# Patient Record
Sex: Female | Born: 1950 | Race: White | Hispanic: No | Smoking: Never smoker
Health system: Southern US, Community
[De-identification: ages and names within clinical notes are randomized; demographics above are authoritative.]

## PROBLEM LIST (undated history)

## (undated) DIAGNOSIS — E669 Obesity, unspecified: Secondary | ICD-10-CM

## (undated) DIAGNOSIS — N183 Chronic kidney disease, stage 3 unspecified: Secondary | ICD-10-CM

## (undated) DIAGNOSIS — E785 Hyperlipidemia, unspecified: Secondary | ICD-10-CM

## (undated) DIAGNOSIS — E039 Hypothyroidism, unspecified: Secondary | ICD-10-CM

## (undated) DIAGNOSIS — F329 Major depressive disorder, single episode, unspecified: Secondary | ICD-10-CM

## (undated) DIAGNOSIS — I10 Essential (primary) hypertension: Secondary | ICD-10-CM

## (undated) DIAGNOSIS — F32A Depression, unspecified: Secondary | ICD-10-CM

## (undated) DIAGNOSIS — E119 Type 2 diabetes mellitus without complications: Secondary | ICD-10-CM

## (undated) DIAGNOSIS — K219 Gastro-esophageal reflux disease without esophagitis: Secondary | ICD-10-CM

## (undated) DIAGNOSIS — Z8719 Personal history of other diseases of the digestive system: Secondary | ICD-10-CM

## (undated) DIAGNOSIS — G2581 Restless legs syndrome: Secondary | ICD-10-CM

## (undated) DIAGNOSIS — Z872 Personal history of diseases of the skin and subcutaneous tissue: Secondary | ICD-10-CM

## (undated) DIAGNOSIS — G709 Myoneural disorder, unspecified: Secondary | ICD-10-CM

## (undated) DIAGNOSIS — M545 Low back pain, unspecified: Secondary | ICD-10-CM

## (undated) DIAGNOSIS — J449 Chronic obstructive pulmonary disease, unspecified: Secondary | ICD-10-CM

## (undated) DIAGNOSIS — I499 Cardiac arrhythmia, unspecified: Secondary | ICD-10-CM

## (undated) DIAGNOSIS — Z85828 Personal history of other malignant neoplasm of skin: Secondary | ICD-10-CM

## (undated) DIAGNOSIS — K922 Gastrointestinal hemorrhage, unspecified: Secondary | ICD-10-CM

## (undated) DIAGNOSIS — L80 Vitiligo: Secondary | ICD-10-CM

## (undated) DIAGNOSIS — J189 Pneumonia, unspecified organism: Secondary | ICD-10-CM

## (undated) DIAGNOSIS — M1990 Unspecified osteoarthritis, unspecified site: Secondary | ICD-10-CM

## (undated) DIAGNOSIS — C801 Malignant (primary) neoplasm, unspecified: Secondary | ICD-10-CM

## (undated) HISTORY — DX: Major depressive disorder, single episode, unspecified: F32.9

## (undated) HISTORY — PX: APPENDECTOMY: SHX54

## (undated) HISTORY — DX: Gastro-esophageal reflux disease without esophagitis: K21.9

## (undated) HISTORY — DX: Low back pain, unspecified: M54.50

## (undated) HISTORY — PX: SHOULDER SURGERY: SHX246

## (undated) HISTORY — DX: Low back pain: M54.5

## (undated) HISTORY — PX: COLONOSCOPY: SHX174

## (undated) HISTORY — DX: Depression, unspecified: F32.A

## (undated) HISTORY — DX: Chronic kidney disease, stage 3 unspecified: N18.30

## (undated) HISTORY — DX: Restless legs syndrome: G25.81

## (undated) HISTORY — DX: Hypothyroidism, unspecified: E03.9

## (undated) HISTORY — DX: Obesity, unspecified: E66.9

## (undated) HISTORY — DX: Chronic kidney disease, stage 3 (moderate): N18.3

## (undated) HISTORY — PX: ABDOMINAL HYSTERECTOMY: SHX81

## (undated) HISTORY — PX: BACK SURGERY: SHX140

## (undated) HISTORY — DX: Hyperlipidemia, unspecified: E78.5

## (undated) HISTORY — PX: CARPAL TUNNEL RELEASE: SHX101

## (undated) HISTORY — PX: TOTAL HIP ARTHROPLASTY: SHX124

---

## 1974-03-24 HISTORY — PX: TUBAL LIGATION: SHX77

## 1982-03-24 HISTORY — PX: FOOT SURGERY: SHX648

## 1983-03-25 HISTORY — PX: PARTIAL HYSTERECTOMY: SHX80

## 1995-03-25 HISTORY — PX: SHOULDER SURGERY: SHX246

## 2000-08-08 ENCOUNTER — Emergency Department (HOSPITAL_COMMUNITY): Admission: EM | Admit: 2000-08-08 | Discharge: 2000-08-09 | Payer: Self-pay | Admitting: *Deleted

## 2000-09-30 ENCOUNTER — Emergency Department (HOSPITAL_COMMUNITY): Admission: EM | Admit: 2000-09-30 | Discharge: 2000-09-30 | Payer: Self-pay | Admitting: *Deleted

## 2000-12-31 ENCOUNTER — Ambulatory Visit (HOSPITAL_COMMUNITY): Admission: RE | Admit: 2000-12-31 | Discharge: 2000-12-31 | Payer: Self-pay | Admitting: Family Medicine

## 2000-12-31 ENCOUNTER — Encounter: Payer: Self-pay | Admitting: Family Medicine

## 2001-04-05 ENCOUNTER — Encounter: Payer: Self-pay | Admitting: Emergency Medicine

## 2001-04-05 ENCOUNTER — Inpatient Hospital Stay (HOSPITAL_COMMUNITY): Admission: EM | Admit: 2001-04-05 | Discharge: 2001-04-08 | Payer: Self-pay | Admitting: Emergency Medicine

## 2001-04-07 ENCOUNTER — Encounter: Payer: Self-pay | Admitting: Cardiovascular Disease

## 2001-12-31 ENCOUNTER — Encounter: Payer: Self-pay | Admitting: Internal Medicine

## 2001-12-31 ENCOUNTER — Emergency Department (HOSPITAL_COMMUNITY): Admission: EM | Admit: 2001-12-31 | Discharge: 2001-12-31 | Payer: Self-pay | Admitting: Emergency Medicine

## 2002-01-10 ENCOUNTER — Encounter: Payer: Self-pay | Admitting: Family Medicine

## 2002-01-10 ENCOUNTER — Ambulatory Visit (HOSPITAL_COMMUNITY): Admission: RE | Admit: 2002-01-10 | Discharge: 2002-01-10 | Payer: Self-pay | Admitting: Family Medicine

## 2002-01-26 ENCOUNTER — Encounter: Payer: Self-pay | Admitting: Family Medicine

## 2002-01-26 ENCOUNTER — Ambulatory Visit (HOSPITAL_COMMUNITY): Admission: RE | Admit: 2002-01-26 | Discharge: 2002-01-26 | Payer: Self-pay | Admitting: Family Medicine

## 2002-03-03 ENCOUNTER — Ambulatory Visit (HOSPITAL_COMMUNITY): Admission: RE | Admit: 2002-03-03 | Discharge: 2002-03-03 | Payer: Self-pay | Admitting: Internal Medicine

## 2002-08-24 ENCOUNTER — Ambulatory Visit (HOSPITAL_COMMUNITY): Admission: RE | Admit: 2002-08-24 | Discharge: 2002-08-24 | Payer: Self-pay | Admitting: Family Medicine

## 2002-10-29 ENCOUNTER — Emergency Department (HOSPITAL_COMMUNITY): Admission: EM | Admit: 2002-10-29 | Discharge: 2002-10-30 | Payer: Self-pay | Admitting: Emergency Medicine

## 2002-10-30 ENCOUNTER — Encounter: Payer: Self-pay | Admitting: Emergency Medicine

## 2003-01-23 ENCOUNTER — Ambulatory Visit (HOSPITAL_COMMUNITY): Admission: RE | Admit: 2003-01-23 | Discharge: 2003-01-23 | Payer: Self-pay | Admitting: Nurse Practitioner

## 2004-02-20 ENCOUNTER — Encounter (HOSPITAL_COMMUNITY): Admission: RE | Admit: 2004-02-20 | Discharge: 2004-03-21 | Payer: Self-pay | Admitting: Preventative Medicine

## 2005-06-29 ENCOUNTER — Observation Stay (HOSPITAL_COMMUNITY): Admission: EM | Admit: 2005-06-29 | Discharge: 2005-06-30 | Payer: Self-pay | Admitting: Emergency Medicine

## 2005-06-30 ENCOUNTER — Ambulatory Visit: Payer: Self-pay | Admitting: Cardiology

## 2008-03-24 HISTORY — PX: INCISE AND DRAIN ABCESS: PRO64

## 2008-05-14 ENCOUNTER — Inpatient Hospital Stay (HOSPITAL_COMMUNITY): Admission: EM | Admit: 2008-05-14 | Discharge: 2008-05-18 | Payer: Self-pay | Admitting: Emergency Medicine

## 2008-05-15 ENCOUNTER — Encounter (INDEPENDENT_AMBULATORY_CARE_PROVIDER_SITE_OTHER): Payer: Self-pay | Admitting: *Deleted

## 2008-05-15 LAB — CONVERTED CEMR LAB
Hgb A1c MFr Bld: 7.1 %
TSH: 2.818 microintl units/mL
TSH: 2.818 u[IU]/mL

## 2008-05-16 ENCOUNTER — Encounter (INDEPENDENT_AMBULATORY_CARE_PROVIDER_SITE_OTHER): Payer: Self-pay | Admitting: *Deleted

## 2008-05-16 LAB — CONVERTED CEMR LAB
Ferritin: 176 ng/mL
TIBC: 238 ug/dL
UIBC: 189 ug/dL

## 2008-06-09 ENCOUNTER — Emergency Department (HOSPITAL_COMMUNITY): Admission: EM | Admit: 2008-06-09 | Discharge: 2008-06-09 | Payer: Self-pay | Admitting: Emergency Medicine

## 2008-06-14 ENCOUNTER — Ambulatory Visit (HOSPITAL_COMMUNITY): Admission: RE | Admit: 2008-06-14 | Discharge: 2008-06-14 | Payer: Self-pay | Admitting: Internal Medicine

## 2008-10-13 ENCOUNTER — Emergency Department (HOSPITAL_COMMUNITY): Admission: EM | Admit: 2008-10-13 | Discharge: 2008-10-13 | Payer: Self-pay | Admitting: Emergency Medicine

## 2008-11-06 ENCOUNTER — Emergency Department (HOSPITAL_COMMUNITY): Admission: EM | Admit: 2008-11-06 | Discharge: 2008-11-06 | Payer: Self-pay | Admitting: Emergency Medicine

## 2008-11-08 ENCOUNTER — Emergency Department (HOSPITAL_COMMUNITY): Admission: EM | Admit: 2008-11-08 | Discharge: 2008-11-08 | Payer: Self-pay | Admitting: Emergency Medicine

## 2008-12-27 ENCOUNTER — Ambulatory Visit (HOSPITAL_COMMUNITY): Admission: RE | Admit: 2008-12-27 | Discharge: 2008-12-27 | Payer: Self-pay | Admitting: Family Medicine

## 2009-01-22 ENCOUNTER — Encounter (INDEPENDENT_AMBULATORY_CARE_PROVIDER_SITE_OTHER): Payer: Self-pay | Admitting: *Deleted

## 2009-01-22 ENCOUNTER — Ambulatory Visit: Payer: Self-pay | Admitting: Cardiology

## 2009-01-22 ENCOUNTER — Ambulatory Visit: Admission: RE | Admit: 2009-01-22 | Discharge: 2009-01-22 | Payer: Self-pay | Admitting: Neurology

## 2009-01-22 DIAGNOSIS — I10 Essential (primary) hypertension: Secondary | ICD-10-CM | POA: Insufficient documentation

## 2009-01-22 DIAGNOSIS — R55 Syncope and collapse: Secondary | ICD-10-CM | POA: Insufficient documentation

## 2009-01-22 DIAGNOSIS — K219 Gastro-esophageal reflux disease without esophagitis: Secondary | ICD-10-CM

## 2009-01-22 DIAGNOSIS — E039 Hypothyroidism, unspecified: Secondary | ICD-10-CM

## 2009-01-22 DIAGNOSIS — E669 Obesity, unspecified: Secondary | ICD-10-CM | POA: Insufficient documentation

## 2009-01-22 DIAGNOSIS — E785 Hyperlipidemia, unspecified: Secondary | ICD-10-CM

## 2009-01-26 ENCOUNTER — Encounter: Payer: Self-pay | Admitting: Cardiology

## 2009-02-05 ENCOUNTER — Ambulatory Visit (HOSPITAL_COMMUNITY): Admission: RE | Admit: 2009-02-05 | Discharge: 2009-02-05 | Payer: Self-pay | Admitting: Cardiology

## 2009-02-05 ENCOUNTER — Encounter: Payer: Self-pay | Admitting: Cardiology

## 2009-02-05 ENCOUNTER — Ambulatory Visit: Payer: Self-pay | Admitting: Cardiology

## 2009-02-08 ENCOUNTER — Encounter (INDEPENDENT_AMBULATORY_CARE_PROVIDER_SITE_OTHER): Payer: Self-pay | Admitting: *Deleted

## 2009-02-21 ENCOUNTER — Telehealth (INDEPENDENT_AMBULATORY_CARE_PROVIDER_SITE_OTHER): Payer: Self-pay | Admitting: *Deleted

## 2009-03-17 ENCOUNTER — Ambulatory Visit: Payer: Self-pay | Admitting: Cardiology

## 2009-03-18 ENCOUNTER — Ambulatory Visit: Payer: Self-pay | Admitting: Cardiology

## 2009-03-20 ENCOUNTER — Encounter (INDEPENDENT_AMBULATORY_CARE_PROVIDER_SITE_OTHER): Payer: Self-pay | Admitting: *Deleted

## 2009-03-21 ENCOUNTER — Ambulatory Visit: Payer: Self-pay | Admitting: Cardiology

## 2009-03-21 ENCOUNTER — Encounter (INDEPENDENT_AMBULATORY_CARE_PROVIDER_SITE_OTHER): Payer: Self-pay | Admitting: *Deleted

## 2009-04-25 ENCOUNTER — Encounter: Payer: Self-pay | Admitting: Cardiology

## 2009-04-27 ENCOUNTER — Encounter (INDEPENDENT_AMBULATORY_CARE_PROVIDER_SITE_OTHER): Payer: Self-pay | Admitting: *Deleted

## 2009-04-27 ENCOUNTER — Encounter: Payer: Self-pay | Admitting: Cardiology

## 2009-04-27 LAB — CONVERTED CEMR LAB
BUN: 27 mg/dL
BUN: 27 mg/dL — ABNORMAL HIGH (ref 6–23)
CO2: 27 meq/L
CO2: 27 meq/L
Calcium: 9.2 mg/dL
Calcium: 9.2 mg/dL (ref 8.4–10.5)
Chloride: 96 meq/L
Creatinine, Ser: 1.59 mg/dL
Creatinine, Ser: 1.59 mg/dL — ABNORMAL HIGH (ref 0.40–1.20)
Glucose, Bld: 117 mg/dL
Glucose, Bld: 117 mg/dL — ABNORMAL HIGH (ref 70–99)
Sodium: 134 meq/L

## 2009-04-30 ENCOUNTER — Encounter (INDEPENDENT_AMBULATORY_CARE_PROVIDER_SITE_OTHER): Payer: Self-pay | Admitting: *Deleted

## 2009-05-10 ENCOUNTER — Ambulatory Visit (HOSPITAL_COMMUNITY): Admission: RE | Admit: 2009-05-10 | Discharge: 2009-05-10 | Payer: Self-pay | Admitting: Orthopaedic Surgery

## 2009-06-26 ENCOUNTER — Encounter (INDEPENDENT_AMBULATORY_CARE_PROVIDER_SITE_OTHER): Payer: Self-pay | Admitting: *Deleted

## 2009-07-24 ENCOUNTER — Telehealth (INDEPENDENT_AMBULATORY_CARE_PROVIDER_SITE_OTHER): Payer: Self-pay

## 2009-07-24 ENCOUNTER — Ambulatory Visit: Payer: Self-pay | Admitting: Internal Medicine

## 2009-07-24 DIAGNOSIS — D649 Anemia, unspecified: Secondary | ICD-10-CM | POA: Insufficient documentation

## 2009-07-25 ENCOUNTER — Encounter: Payer: Self-pay | Admitting: Internal Medicine

## 2009-07-26 ENCOUNTER — Ambulatory Visit: Payer: Self-pay | Admitting: Internal Medicine

## 2009-08-03 ENCOUNTER — Encounter (INDEPENDENT_AMBULATORY_CARE_PROVIDER_SITE_OTHER): Payer: Self-pay | Admitting: *Deleted

## 2009-08-03 LAB — CONVERTED CEMR LAB
Eosinophils Absolute: 0.3 10*3/uL
Eosinophils Relative: 4 %
Ferritin: 169 ng/mL
HCT: 31.2 %
Hemoglobin: 10.3 g/dL
Iron: 75 ug/dL
Lymphs Abs: 2.3 10*3/uL
MCHC: 29 g/dL
Monocytes Relative: 8 %
RBC: 3.55 M/uL

## 2009-08-06 ENCOUNTER — Encounter: Payer: Self-pay | Admitting: Gastroenterology

## 2009-08-06 LAB — CONVERTED CEMR LAB
Eosinophils Absolute: 0.3 10*3/uL (ref 0.0–0.7)
Eosinophils Relative: 4 % (ref 0–5)
HCT: 31.2 % — ABNORMAL LOW (ref 36.0–46.0)
Iron: 75 ug/dL (ref 42–145)
Lymphs Abs: 2.3 10*3/uL (ref 0.7–4.0)
MCV: 87.9 fL (ref 78.0–100.0)
Platelets: 354 10*3/uL (ref 150–400)
RDW: 14.3 % (ref 11.5–15.5)
WBC: 8.1 10*3/uL (ref 4.0–10.5)

## 2009-08-07 ENCOUNTER — Ambulatory Visit: Payer: Self-pay | Admitting: Internal Medicine

## 2009-08-07 ENCOUNTER — Ambulatory Visit (HOSPITAL_COMMUNITY): Admission: RE | Admit: 2009-08-07 | Discharge: 2009-08-07 | Payer: Self-pay | Admitting: Internal Medicine

## 2009-08-10 ENCOUNTER — Encounter: Payer: Self-pay | Admitting: Gastroenterology

## 2009-09-07 ENCOUNTER — Encounter (INDEPENDENT_AMBULATORY_CARE_PROVIDER_SITE_OTHER): Payer: Self-pay

## 2009-09-17 ENCOUNTER — Encounter (INDEPENDENT_AMBULATORY_CARE_PROVIDER_SITE_OTHER): Payer: Self-pay | Admitting: *Deleted

## 2009-09-17 LAB — CONVERTED CEMR LAB
Basophils Relative: 0 %
HCT: 31.9 %
Hemoglobin: 10.1 g/dL
Lymphocytes Relative: 21 %
Lymphocytes Relative: 21 %
Lymphs Abs: 2.4 10*3/uL
Lymphs Abs: 2.4 10*3/uL
MCHC: 28.9 g/dL
Monocytes Absolute: 1 10*3/uL
Monocytes Relative: 9 %
Monocytes Relative: 9 %
Neutrophils Relative %: 397 %
RBC: 3.49 M/uL
RDW: 14.8 %
RDW: 31.7 %

## 2009-09-19 LAB — CONVERTED CEMR LAB
Basophils Relative: 0 % (ref 0–1)
Eosinophils Absolute: 0.2 10*3/uL (ref 0.0–0.7)
Eosinophils Relative: 2 % (ref 0–5)
MCHC: 31.7 g/dL (ref 30.0–36.0)
MCV: 91.4 fL (ref 78.0–100.0)
Monocytes Absolute: 1 10*3/uL (ref 0.1–1.0)
Monocytes Relative: 9 % (ref 3–12)
Neutrophils Relative %: 68 % (ref 43–77)
RBC: 3.49 M/uL — ABNORMAL LOW (ref 3.87–5.11)

## 2009-09-23 ENCOUNTER — Emergency Department (HOSPITAL_COMMUNITY): Admission: EM | Admit: 2009-09-23 | Discharge: 2009-09-23 | Payer: Self-pay | Admitting: Emergency Medicine

## 2009-10-16 ENCOUNTER — Encounter (INDEPENDENT_AMBULATORY_CARE_PROVIDER_SITE_OTHER): Payer: Self-pay | Admitting: *Deleted

## 2009-11-20 ENCOUNTER — Encounter (INDEPENDENT_AMBULATORY_CARE_PROVIDER_SITE_OTHER): Payer: Self-pay | Admitting: *Deleted

## 2009-11-22 ENCOUNTER — Ambulatory Visit: Payer: Self-pay | Admitting: Cardiology

## 2009-11-22 DIAGNOSIS — M109 Gout, unspecified: Secondary | ICD-10-CM | POA: Insufficient documentation

## 2009-12-24 ENCOUNTER — Ambulatory Visit: Payer: Self-pay | Admitting: Cardiology

## 2009-12-27 ENCOUNTER — Emergency Department (HOSPITAL_COMMUNITY)
Admission: EM | Admit: 2009-12-27 | Discharge: 2009-12-27 | Payer: Self-pay | Source: Home / Self Care | Admitting: Emergency Medicine

## 2009-12-27 LAB — CONVERTED CEMR LAB
Calcium: 8.7 mg/dL
Glomerular Filtration Rate, Af Am: 29 mL/min/{1.73_m2}
HCT: 28.4 %
MCV: 31.5 fL
Potassium: 4.2 meq/L
Sodium: 133 meq/L
Specific Gravity, Urine: 1.015
Urine Glucose: >1000 mg/dL
pH: 5.5

## 2009-12-31 ENCOUNTER — Ambulatory Visit (HOSPITAL_COMMUNITY)
Admission: RE | Admit: 2009-12-31 | Discharge: 2009-12-31 | Payer: Self-pay | Source: Home / Self Care | Admitting: Family Medicine

## 2010-01-31 ENCOUNTER — Encounter: Payer: Self-pay | Admitting: Cardiology

## 2010-02-20 ENCOUNTER — Ambulatory Visit: Payer: Self-pay | Admitting: Cardiology

## 2010-02-20 LAB — CONVERTED CEMR LAB
CO2: 25 meq/L (ref 19–32)
Calcium: 9.7 mg/dL (ref 8.4–10.5)
Creatinine, Ser: 2.47 mg/dL — ABNORMAL HIGH (ref 0.40–1.20)
Glucose, Bld: 396 mg/dL — ABNORMAL HIGH (ref 70–99)

## 2010-03-03 ENCOUNTER — Emergency Department (HOSPITAL_COMMUNITY)
Admission: EM | Admit: 2010-03-03 | Discharge: 2010-03-03 | Payer: Self-pay | Source: Home / Self Care | Admitting: Emergency Medicine

## 2010-04-21 LAB — CONVERTED CEMR LAB
BUN: 14 mg/dL
Calcium: 8.5 mg/dL
Creatinine, Ser: 1.6 mg/dL
HDL: 21 mg/dL
MCV: 85 fL
Platelets: 369 10*3/uL
TSH: 2.8 microintl units/mL
Total CK: 53 units/L
Triglycerides: 287 mg/dL

## 2010-04-23 NOTE — Progress Notes (Signed)
Summary: pt to do ifobt stool test  ---- Converted from flag ---- ---- 07/24/2009 12:20 PM, Laureen Ochs. Lewis PA-C wrote: she needs to do ifobt asap. if positive, then will need to add on egd at time of tcs. dx, chronic anemia ------------------------------  Appended Document: pt to do ifobt stool test Pt informed. She will come by tomorrow and ask to speak to nurse for directions on doing the test.

## 2010-04-23 NOTE — Letter (Signed)
Summary: [facility], general practice at [facility]. [address] [place] [address]   Phone: [phone]  Fax: [phone]      April 30, 2009 MRN: [id number]   [facility] [address] [place], [place]  [address]   Dear Ms. Gilford Rile,  Your test ordered by Rande Lawman has been reviewed by your physician (or physician assistant) and was found to be normal or stable. Your physician (or physician assistant) felt no changes were needed at this time.  ____ Echocardiogram  ____ Cardiac Stress Test  __x__ Lab Work  ____ Peripheral vascular study of arms, legs or neck  ____ CT scan or X-ray  ____ Lung or Breathing test  ____ Other:  No change in medical treatment at this time, your labwork is stable, per Dr. Lattie Haw.  Thank you, Perrin Eddleman Baird Cancer RN    Jacqulyn Ducking, MD, Leana Gamer.C.Renella Cunas, MD, F.A.C.C Cristopher Peru, MD, F.A.C.C Rozann Lesches, MD, F.A.C.C Jenkins Rouge, MD, Leana Gamer.C.C

## 2010-04-23 NOTE — Procedures (Signed)
Summary: LIFE WATCH  LIFE WATCH   Imported By: Nevada Crane 04/25/2009 11:42:27  _____________________________________________________________________  External Attachment:    Type:   Image     Comment:   External Document

## 2010-04-23 NOTE — Assessment & Plan Note (Signed)
Summary: 1 MTH NURSE VISIT/BP CHECK PER CHECKOUT ON 11/22/09/TG  Nurse Visit   Vital Signs:  Patient profile:   60 year old female O2 Sat:      95 % on Room air Pulse rate:   75 / minute BP sitting:   159 / 82  (left arm)  Vitals Entered By: Jeani Hawking Via LPN (October  3, 624THL 9:08 AM)  O2 Flow:  Room air  Visit Type:  BP check/Nurse visit Referring Provider:  . Primary Provider:  Karie Kirks   History of Present Illness: S: Pt. returns to office for 1 month BP check/nurse visit. B: On last OV with Dr. Lattie Haw on 11-22-09 BP was suboptimal at 138/69. A: Pt. has no complaints at this time. BP this morning= 159/82, pt's home BP's varies. Pt. brought in med list and BP diary. Pt. is taking meds as directed and BP diary scanned into chart. R: Pt. advised to continue on current meds and we will call her with Dr. Izell Viola recommendations.  12/29/09  amlodipine 5 mg by mouth once daily Bmet BP check and review of home BPs in 1 month.  Jacqulyn Ducking, M.D.  [facility].   Jeani Hawking Via LPN  October 10, 624THL 8:38 AM   Pt. advised, she states she understands instructions given. RX faxed to [facility]. BMET was drawn on 12-27-09 while pt. was in ER at [facility]. (entered in chart). Pt. given BP diary and asked to bring back with her to nurse visit in 1 month.    Jeani Hawking Via LPN  October 10, 624THL 1:14 PM    Current Medications (verified): 1)  Celexa 20 Mg Tabs (Citalopram Hydrobromide) .... Take 1 Tab Daily 2)  Gabapentin 800 Mg Tabs (Gabapentin) .... Take 1 Tab Three Times A Day 3)  Hydrochlorothiazide 25 Mg Tabs (Hydrochlorothiazide) .... Take 1 Tab Daily 4)  Levothyroxine Sodium 50 Mcg Tabs (Levothyroxine Sodium) .... Take 1 Tab Daily 5)  Omeprazole 20 Mg Cpdr (Omeprazole) .... Take 1 Tab Two Times A Day 6)  Pravastatin Sodium 40 Mg Tabs (Pravastatin Sodium) .Marland Kitchen.. 1 Tab By Mouth Once Daily 7)  Glipizide 5 Mg Tabs (Glipizide) .... Take 1 Tablet By Mouth Two Times A Day 8)  Lisinopril 20 Mg Tabs  (Lisinopril) .... Take One Tablet By Mouth Daily 9)  Allopurinol 300 Mg Tabs (Allopurinol) .... Take 1 Tablet By Mouth Once A Day 10)  Ropinirole Hcl 0.5 Mg Tabs (Ropinirole Hcl) .... Take 3 Tablets By Mouth At Bedtime 11)  Potassium Chloride Crys Cr 20 Meq Cr-Tabs (Potassium Chloride Crys Cr) .... Take As Needed 12)  Furosemide 40 Mg Tabs (Furosemide) .... Take As Needed 13)  Ativan 1 Mg Tabs (Lorazepam) .... Take 1/2 Tab Three Times A Day 14)  Lantus 100 Unit/ml Soln (Insulin Glargine) .... 50 Units At Night 15)  Diclofenac Sodium 75 Mg Tbec (Diclofenac Sodium) .... Take 1 Tab Two Times A Day 16)  Zolpidem Tartrate 10 Mg Tabs (Zolpidem Tartrate) .... Take 1 Tablet Every Other Day 17)  Hydrocodone-Acetaminophen 7.5-500 Mg Tabs (Hydrocodone-Acetaminophen) .... Take 1 Tablet As Needed 18)  Amlodipine Besylate 5 Mg Tabs (Amlodipine Besylate) .... Take 1 Tablet By Mouth Once Daily  Allergies (verified): 1)  ! * Latex 2)  ! Vancomycin Hcl in Dextrose (Vancomycin Hcl in Dextrose) 3)  ! Accupril  Orders Added: 1)  T-Basic Metabolic Panel 0000000 Prescriptions: AMLODIPINE BESYLATE 5 MG TABS (AMLODIPINE BESYLATE) take 1 tablet by mouth once daily  #30 x 3   Entered  by:   Jeani Hawking Via LPN   Authorized by:   Yehuda Savannah, MD, [facility]   Signed by:   Jeani Hawking Via LPN on [id number]   Method used:   Electronically to        [facility] (retail)       [place] [address]       [place], [place]  [address]       Ph: [phone]       Fax: [phone]   RxID:   [id number]

## 2010-04-23 NOTE — Letter (Signed)
Summary: BP LOG  BP LOG   Imported By: Nevada Crane 12/24/2009 11:10:18  _____________________________________________________________________  External Attachment:    Type:   Image     Comment:   External Document

## 2010-04-23 NOTE — Procedures (Signed)
Summary: Holter and Event  Holter and Event   Imported By: [name], CNA 03/27/2009 10:26:53  _____________________________________________________________________  External Attachment:    Type:   Image     Comment:   External Document

## 2010-04-23 NOTE — Letter (Signed)
Summary: Recall, Labs Needed  [facility]  [address]   [place], [place] [address]   Phone: [phone]  Fax: [phone]    September 07, 2009  TARRIN LECKNER [address] [place], [place]  [address] [birth year]   Dear Ms. Gilford Rile,   Our records indicate it is time to repeat your blood work.  You can take the enclosed form to the lab on or near the date indicated.  Please make note of the new location of the lab:   [facility], 2nd floor   [place] office will call you within a week to ten business days with the results.  If you do not hear from [place] in 10 business days, you should call the office.  If you have any questions regarding this, call the office at [phone], and ask for the nurse.  Labs are due on 10/01/2009.   Sincerely,    Burnadette Peter LPN  [facility] Ph: [phone]   Fax: [phone]

## 2010-04-23 NOTE — Miscellaneous (Signed)
Summary: LABS CBCD,09/17/2009  Clinical Lists Changes  Observations: Added new observation of ABSOLUTE BAS: 0.1 K/uL (09/17/2009 10:04) Added new observation of BASOPHIL %: 0 % (09/17/2009 10:04) Added new observation of EOS ABSLT: 0.2 K/uL (09/17/2009 10:04) Added new observation of % EOS AUTO: 2 % (09/17/2009 10:04) Added new observation of ABSOLUTE MON: 1.0 K/uL (09/17/2009 10:04) Added new observation of MONOCYTE %: 9 % (09/17/2009 10:04) Added new observation of ABS LYMPHOCY: 2.4 K/uL (09/17/2009 10:04) Added new observation of LYMPHS %: 21 % (09/17/2009 10:04) Added new observation of PMN %: 397 % (09/17/2009 10:04) Added new observation of PLATELETK/UL: 14.8 K/uL (09/17/2009 10:04) Added new observation of RDW: 31.7 % (09/17/2009 10:04) Added new observation of MCHC RBC: 28.9 g/dL (09/17/2009 10:04) Added new observation of MCV: 91.4 fL (09/17/2009 10:04) Added new observation of HCT: 31.9 % (09/17/2009 10:04) Added new observation of HGB: 10.1 g/dL (09/17/2009 10:04) Added new observation of RBC M/UL: 3.49 M/uL (09/17/2009 10:04) Added new observation of WBC COUNT: 11.6 10*3/microliter (09/17/2009 10:04) Added new observation of FERRITIN: 169 ng/mL (08/03/2009 10:04) Added new observation of IRON SATUR %: 25 % (08/03/2009 10:04) Added new observation of TIBC: 306 mcg/dL (08/03/2009 10:04) Added new observation of UIBC: 231 mcg/dL (08/03/2009 10:04) Added new observation of IRON: 75 mcg/dL (08/03/2009 10:04) Added new observation of ABSOLUTE BAS: 0.1 K/uL (08/03/2009 10:04) Added new observation of BASOPHIL %: 1 % (08/03/2009 10:04) Added new observation of EOS ABSLT: 0.3 K/uL (08/03/2009 10:04) Added new observation of % EOS AUTO: 4 % (08/03/2009 10:04) Added new observation of ABSOLUTE MON: 0.7 K/uL (08/03/2009 10:04) Added new observation of MONOCYTE %: 8 % (08/03/2009 10:04) Added new observation of ABS LYMPHOCY: 2.3 K/uL (08/03/2009 10:04) Added new observation of LYMPHS  %: 28 % (08/03/2009 10:04) Added new observation of PLATELETK/UL: 354 K/uL (08/03/2009 10:04) Added new observation of RDW: 14.3 % (08/03/2009 10:04) Added new observation of MCHC RBC: 29.0 g/dL (08/03/2009 10:04) Added new observation of MCV: 87.9 fL (08/03/2009 10:04) Added new observation of HCT: 31.2 % (08/03/2009 10:04) Added new observation of HGB: 10.3 g/dL (08/03/2009 10:04) Added new observation of RBC M/UL: 3.55 M/uL (08/03/2009 10:04) Added new observation of WBC COUNT: 8.1 10*3/microliter (08/03/2009 10:04) Added new observation of CALCIUM: 9.2 mg/dL (04/27/2009 10:04) Added new observation of CREATININE: 1.59 mg/dL (04/27/2009 10:04) Added new observation of BUN: 27 mg/dL (04/27/2009 10:04) Added new observation of BG RANDOM: 117 mg/dL (04/27/2009 10:04) Added new observation of CO2 PLSM/SER: 27 meq/L (04/27/2009 10:04) Added new observation of CL SERUM: 96 meq/L (04/27/2009 10:04) Added new observation of K SERUM: 4.6 meq/L (04/27/2009 10:04) Added new observation of NA: 134 meq/L (04/27/2009 10:04)

## 2010-04-23 NOTE — Letter (Signed)
Summary: TCS ORDER  TCS ORDER   Imported By: Sofie Rower 07/24/2009 11:24:44  _____________________________________________________________________  External Attachment:    Type:   Image     Comment:   External Document  Appended Document: TCS ORDER I called pt to give dm adjustment given to me per Magda Paganini take half her Glipizide the day before her procedure.

## 2010-04-23 NOTE — Assessment & Plan Note (Signed)
Summary: [id number]   Visit Type:  Follow-up Referring Fantasia Jinkins:  . Primary [name]:  Karie Kirks   History of Present Illness: [name] returns to the office for continued assessment and treatment of syncope and cardiovascular risk factors.  Since her last visit, she has done generally well.  She has had no episodes of loss of consciousness and no falls.  She occasionally experiences orthostatic dizziness that resolves quickly.  She was diagnosed with restless leg syndrome and is being treated with ropinirole.  Treatment for diabetes, hypertension and hyperlipidemia has been rationalized by Dr. Karie Kirks.  She developed lower extremity edema in the setting of bilateral knee arthritis and was given furosemide, which she takes once or twice a week.  Current Medications (verified): 1)  Celexa 20 Mg Tabs (Citalopram Hydrobromide) .... Take 1 Tab Daily 2)  Gabapentin 800 Mg Tabs (Gabapentin) .... Take 1 Tab Three Times A Day 3)  Hydrochlorothiazide 25 Mg Tabs (Hydrochlorothiazide) .... Take 1 Tab Daily 4)  Levothyroxine Sodium 50 Mcg Tabs (Levothyroxine Sodium) .... Take 1 Tab Daily 5)  Omeprazole 20 Mg Cpdr (Omeprazole) .... Take 1 Tab Two Times A Day 6)  Pravastatin Sodium 40 Mg Tabs (Pravastatin Sodium) .Marland Kitchen.. 1 Tab By Mouth Once Daily 7)  Glipizide 5 Mg Tabs (Glipizide) .... Take 1 Tablet By Mouth Two Times A Day 8)  Lisinopril 20 Mg Tabs (Lisinopril) .... Take One Tablet By Mouth Daily 9)  Allopurinol 300 Mg Tabs (Allopurinol) .... Take 1 Tablet By Mouth Once A Day 10)  Ropinirole Hcl 0.25 Mg Tabs (Ropinirole Hcl) .... Take 3 Tablets At Bed Time 11)  Potassium Chloride Crys Cr 20 Meq Cr-Tabs (Potassium Chloride Crys Cr) .... Take As Needed 12)  Furosemide 40 Mg Tabs (Furosemide) .... Take As Needed 13)  Ativan 1 Mg Tabs (Lorazepam) .... Take 1/2 Tab Three Times A Day 14)  Lantus 100 Unit/ml Soln (Insulin Glargine) .... 50 Units At Night 15)  Diclofenac Sodium 75 Mg Tbec (Diclofenac Sodium) ....  Take 1 Tab Two Times A Day  Allergies (verified): 1)  ! * Latex 2)  ! Vancomycin Hcl in Dextrose (Vancomycin Hcl in Dextrose) 3)  ! Accupril  Past History:  PMH, FH, and Social History reviewed and updated.  Past Medical History: Chest pain-hospitalized in 2007 Syncope: 2010  HYPERLIPIDEMIA (ICD-272.4) HYPERTENSION (ICD-401.9) DM (ICD-250.00) Restless leg syndrome Gastroesophageal reflux disease Gastrointestinal bleeding secondary to nonsteroidals treated in ED several years ago in [place] Obesity Gout HYPOTHYROIDISM (ICD-244.9) Insomnia Low back pain Cellulitis of the neck-2010 TCS 2003--> Normal colonoscopy except few small diverticula at sigmoid colon and external hemorrhoids. MI, 2001  Review of Systems  The patient denies weight loss, weight gain, chest pain, syncope, dyspnea on exertion, peripheral edema, headaches, and abdominal pain.    Vital Signs:  Patient profile:   60 year old female Weight:      217 pounds BMI:     34.11 Pulse rate:   85 / minute Pulse (ortho):   80 / minute BP sitting:   138 / 69  (right arm) BP standing:   128 / 74  Vitals Entered By: [name], CNA (November 22, 2009 11:55 AM)  Serial Vital Signs/Assessments:  Time      Position  BP       Pulse  Resp  Temp     By 12:48 PM  Lying RA  117/66   77  [name], CNA 12:48 PM  Sitting   128/73   [address] CNA 12:48 PM  Standing  128/74   90                    [address], CNA   Physical Exam  General:  Overweight; well developed; no acute distress: Vital signs-no difference between lying and standing blood pressures; weight is 217, 10 pounds more than at her last visit.   Neck-No JVD; no carotid bruits: Lungs-No tachypnea, no rales; no rhonchi; no wheezes: Cardiovascular-normal PMI; normal S1 and S2; modest systolic murmur Abdomen-BS normal; soft and non-tender without masses or organomegaly:  Musculoskeletal-No deformities, no  cyanosis or clubbing: Neurologic-Normal cranial nerves; symmetric strength and tone:  Skin-Warm; multiple tattoos; vitiligo Extremities-Nl distal pulses; trace edema:     Impression & Recommendations:  Problem # 1:  SYNCOPE (ICD-780.2) There has been no recurrence of loss of consciousness.  She has no apparent cardiovascular disease and has had no significant orthostatic change in blood pressure on 2 occasions.  No further evaluation is warranted at the present time.  Problem # 2:  HYPERLIPIDEMIA (P102836.4) Lipid profile was excellent in 2010 except for an elevated triglyceride level; I will attempt to locate more recent results from Dr. Vickey Sages  office and leave additional testing to his discretion.  Problem # 3:  ANEMIA (ICD-285.9) She has moderately severe anemia with negative iron studies.  She has apparently already been referred to a gastroenterologist.   This may be anemia of chronic disease related to renal insufficiency; however, with a creatinine of only 1.6, anemia to this degree is unusual.  Problem # 4:  HYPERTENSION (ICD-401.9) Blood pressure control is suboptimal in the setting of diabetes; current medications will be continued for now with the patient asked to acquire additional blood pressure measurements within the next month at which time she will return for a nursing visit to reassess hypertension and to reconsider further medical therapy.  Weight loss recommended.  I will plan to reassess this nice woman in one year.  Other Orders: Durable Medical Equipment (DME)  Patient Instructions: 1)  Your physician recommends that you schedule a follow-up appointment in: 1 year 2)  Your physician recommends that you continue on your current medications as directed. Please refer to the Current Medication list given to you today. 3)  You have been referred to NURSE VISIT 1 MONTH FOR BP CHECK  Appended Document: [place] Please route to Dr. Karie Kirks.  RE: Elevated blood glucose

## 2010-04-23 NOTE — Letter (Signed)
Summary: Scheduled Appointment  [facility]  [address]   [place], [place] [address]   Phone: [phone]  Fax: [phone]    June 26, 2009   Dear: Domingo Cocking Heroux            DOB: 11/13/1950    I have been instructed to schedule you an appointment in our office.  Your appointment is as follows:   Date: April 28,2011     Time: 10:30am     Please be here 15 minutes early.   Provider: Neil Crouch, PA   Please contact the office if you need to reschedule this appointment for a more convenient time.   Thank you,    [facility] Ph: [phone]   Fax: [phone]

## 2010-04-23 NOTE — Letter (Signed)
Summary: BP READINGS  BP READINGS   Imported By: [place] Crane 01/31/2010 11:20:48  _____________________________________________________________________  External Attachment:    Type:   Image     Comment:   External Document

## 2010-04-23 NOTE — Miscellaneous (Signed)
Summary: labs cbcd 09/17/2009  Clinical Lists Changes  Observations: Added new observation of ABSOLUTE BAS: 0.1 K/uL (09/17/2009 13:46) Added new observation of BASOPHIL %: 0 % (09/17/2009 13:46) Added new observation of EOS ABSLT: 0.2 K/uL (09/17/2009 13:46) Added new observation of % EOS AUTO: 2 % (09/17/2009 13:46) Added new observation of ABSOLUTE MON: 1.0 K/uL (09/17/2009 13:46) Added new observation of MONOCYTE %: 9 % (09/17/2009 13:46) Added new observation of ABS LYMPHOCY: 2.4 K/uL (09/17/2009 13:46) Added new observation of LYMPHS %: 21 % (09/17/2009 13:46) Added new observation of RDW: 14.8 % (09/17/2009 13:46) Added new observation of MCHC RBC: 3.49 g/dL (09/17/2009 13:46) Added new observation of PLATELETK/UL: 397 K/uL (09/17/2009 13:46) Added new observation of MCV: 91.4 fL (09/17/2009 13:46) Added new observation of HCT: 31.9 % (09/17/2009 13:46) Added new observation of HGB: 10.1 g/dL (09/17/2009 13:46) Added new observation of WBC COUNT: 11.6 10*3/microliter (09/17/2009 13:46)

## 2010-04-23 NOTE — Letter (Signed)
Summary: External Other  External Other   Imported By: Sherry Ruffing 07/25/2009 10:34:06  _____________________________________________________________________  External Attachment:    Type:   Image     Comment:   External Document

## 2010-04-23 NOTE — Assessment & Plan Note (Signed)
Summary: FAMILY HX OF COLON CANCER/CONSULT FOR TCS/SS   Visit Type:  Initial Consult Referring Provider:  Karie Kirks Primary Care Provider:  Karie Kirks  Chief Complaint:  TCS/ FH colon cancer.  History of Present Illness: [name] is here to schedule colonoscopy secondary to personal h/o colon polyps and FH of CRC. She also gives h/o IDA. During hospitalization in 2/11, her Hgb was 10. Further back in [place], in 04/2008 her Hgb was in 10 range as well. She had normal iron and ferriten at that time. She denies any melena, brbpr. Her BMs are regular. GERD controlled on omeprazole. Denies dysphagia. C/O 25 pound weight gain in last six months. Last labs done either with Dr. Karie Kirks, Lattie Haw, or [place].        Current Medications (verified): 1)  Celexa 20 Mg Tabs (Citalopram Hydrobromide) .... Take 1 Tab Daily 2)  Gabapentin 800 Mg Tabs (Gabapentin) .... Take 1 Tab Three Times A Day 3)  Hydrochlorothiazide 25 Mg Tabs (Hydrochlorothiazide) .... Take 1 Tab Daily 4)  Levothyroxine Sodium 50 Mcg Tabs (Levothyroxine Sodium) .... Take 1 Tab Daily 5)  Omeprazole 20 Mg Cpdr (Omeprazole) .... Take 1 Tab Two Times A Day 6)  Pravastatin Sodium 40 Mg Tabs (Pravastatin Sodium) .Marland Kitchen.. 1 Tab By Mouth Once Daily 7)  Glipizide 5 Mg Tabs (Glipizide) .... Take 1 Tablet By Mouth Two Times A Day 8)  Lisinopril 20 Mg Tabs (Lisinopril) .... Take One Tablet By Mouth Daily 9)  Allopurinol 300 Mg Tabs (Allopurinol) .... Take 1 Tablet By Mouth Once A Day 10)  Ropinirole Hcl 0.25 Mg Tabs (Ropinirole Hcl) .... Take 3 Tablets At Bed Time  Allergies (verified): 1)  ! * Latex 2)  ! Vancomycin Hcl in Dextrose (Vancomycin Hcl in Dextrose) 3)  ! Accupril  Past History:  Past Medical History: Chest pain-hospitalized in 2005-06-23 Syncope: 2008/06/23  HYPERLIPIDEMIA (ICD-272.4) HYPERTENSION (ICD-401.9) DM (ICD-250.00) Gastroesophageal reflux disease Gastrointestinal bleeding secondary to nonsteroidals treated in ED several years  ago in [place] Obesity HYPOTHYROIDISM (ICD-244.9) Insomnia Low back pain Cellulitis of the neck-2010 TCS 2003--> Normal colonoscopy except few small diverticula at sigmoid colon and external hemorrhoids. MI, 06-24-99  Past Surgical History: Appendectomy Carpal tunnel repair, right Tubal ligation Partial hysterectomy Orthopedic procedures on foot, hand and shoulder I+D, abscess of neck 2008-06-23  Family History: Father died due to carcinoma of the colon, dx late 43s. Mother died due to emphysema.   Siblings: 2 brothers, one of whom is deceased he to trauma.  One sister is alive and well.  Social History: Reviewed history from 01/22/2009 and no changes required. Tobacco Use - No.  Alcohol Use - no Regular Exercise - no Drug Use - no Disabled Divorced with 3 children  Review of Systems General:  Denies fever, chills, sweats, anorexia, fatigue, weakness, and weight loss. Eyes:  Denies vision loss. ENT:  Denies nasal congestion, sore throat, hoarseness, and difficulty swallowing. CV:  Denies chest pains, angina, palpitations, dyspnea on exertion, and peripheral edema. Resp:  Denies dyspnea at rest, dyspnea with exercise, and cough. GI:  See HPI. GU:  Denies urinary burning and blood in urine. MS:  Denies joint pain / LOM. Derm:  Denies rash and itching. Neuro:  Denies weakness, frequent headaches, memory loss, and confusion. Psych:  Denies depression and anxiety. Endo:  Denies unusual weight change. Heme:  Denies bruising and bleeding. Allergy:  Denies hives and rash.  Vital Signs:  Patient profile:   60 year old female Height:  67 inches Weight:      207.50 pounds BMI:     32.62 Temp:     98.4 degrees F oral Pulse rate:   72 / minute BP sitting:   122 / 70  (left arm) Cuff size:   regular  Vitals Entered By: Waldon Merl LPN (May  3, 624THL [id number] AM)  Physical Exam  General:  Well developed, well nourished, no acute distress.obese.   Head:  Normocephalic and  atraumatic. Eyes:  Conjunctivae pink, no scleral icterus.  Mouth:  Oropharyngeal mucosa moist, pink.  No lesions, erythema or exudate.   Edentulous. Neck:  Supple; no masses or thyromegaly. Lungs:  Clear throughout to auscultation. Heart:  Regular rate and rhythm; no murmurs, rubs,  or bruits. Abdomen:  Bowel sounds normal.  Abdomen is soft, nontender, nondistended.  No rebound or guarding.  No hepatosplenomegaly, masses or hernias.  No abdominal bruits. obese.   Rectal:  deferred until time of colonoscopy.   Extremities:  No clubbing, cyanosis, edema or deformities noted. Neurologic:  Alert and  oriented x4;  grossly normal neurologically. Skin:  Multiple tatoos. Cervical Nodes:  No significant cervical adenopathy. Psych:  Alert and cooperative. Normal mood and affect.  Impression & Recommendations:  Problem # 1:  COLONIC POLYPS, HX OF (ICD-V12.72)  H/O eight polyps on TCS in [place], none on last TCS in 2003. FH of CRC in father, late age 22s. Due for surveillance TCS. She also has had chronic anemia without evidence of iron deficiency. Will obtain most recent labs. Check ifobt. No need for EGD at this time, however, if ifobt positive and TCS unremarkable, then would consider EGD. Colonoscopy to be performed in near future.  Risks, alternatives, and benefits including but not limited to the risk of reaction to medication, bleeding, infection, and perforation were addressed.  Patient voiced understanding and provided verbal consent.   Orders: Consultation Level IV [id number]) I would like to thank Dr. Karie Kirks for allowing [place] to take part in the care of this nice patient.   Appended Document: Orders Update-labs Labs from Dr. Hinda Lenis 05/07/09--> Creatinine 2.28.  Let's have her do CBC, iron, tibc, ferritin.   Clinical Lists Changes  Orders: Added new Test order of T-Ferritin [phone]) - Signed Added new Test order of T-Iron [phone]) - Signed Added new Test order of T-Iron Binding  Capacity (TIBC) ([phone]) - Signed Added new Test order of T-CBC w/Diff [id number]) - Signed      Appended Document: FAMILY HX OF COLON CANCER/CONSULT FOR TCS/SS Pt informed. Order faxed to [facility].

## 2010-04-23 NOTE — Assessment & Plan Note (Signed)
Summary: 1 mth bp check per Jeani Hawking on 12/31/09/tg  Nurse Visit   Vital Signs:  Patient profile:   60 year old female Height:      67 inches Weight:      219 pounds O2 Sat:      96 % on Room air Temp:     97.7 degrees F oral Pulse rate:   78 / minute BP sitting:   163 / 84  (left arm)  Vitals Entered By: Tye Savoy RN (February 20, 2010 9:26 AM)  O2 Flow:  Room air  Current Medications (verified): 1)  Celexa 20 Mg Tabs (Citalopram Hydrobromide) .... Take 1 Tab Daily 2)  Gabapentin 800 Mg Tabs (Gabapentin) .... Pt Takes 3 Tablets At Bedtime 3)  Hydrochlorothiazide 25 Mg Tabs (Hydrochlorothiazide) .... Take 1 Tab Daily 4)  Levothyroxine Sodium 50 Mcg Tabs (Levothyroxine Sodium) .... Take 1 Tab Daily 5)  Zantac 150 Mg Tabs (Ranitidine Hcl) .... Take 1 Tablet By Mouth Two Times A Day 6)  Pravastatin Sodium 40 Mg Tabs (Pravastatin Sodium) .Marland Kitchen.. 1 Tab By Mouth Once Daily 7)  Glipizide 5 Mg Tabs (Glipizide) .... Take 1 Tablet By Mouth Two Times A Day 8)  Lisinopril 20 Mg Tabs (Lisinopril) .... Take One Tablet By Mouth Daily 9)  Allopurinol 300 Mg Tabs (Allopurinol) .... Take 1 Tablet By Mouth Once A Day 10)  Ropinirole Hcl 0.5 Mg Tabs (Ropinirole Hcl) .... Take 3 Tablets By Mouth At Bedtime 11)  Potassium Chloride Crys Cr 20 Meq Cr-Tabs (Potassium Chloride Crys Cr) .... Take As Needed 12)  Furosemide 40 Mg Tabs (Furosemide) .... Take As Needed 13)  Ativan 1 Mg Tabs (Lorazepam) .Marland Kitchen.. 1 Tablet Three Times A Day As Needed Anxiety 14)  Lantus 100 Unit/ml Soln (Insulin Glargine) .... 50 Units Two Times A Day 15)  Diclofenac Sodium 75 Mg Tbec (Diclofenac Sodium) .... Take 1 Tab Two Times A Day 16)  Zolpidem Tartrate 10 Mg Tabs (Zolpidem Tartrate) .... Take 1 Tablet Every Other Day 17)  Hydrocodone-Acetaminophen 7.5-500 Mg Tabs (Hydrocodone-Acetaminophen) .... Take 1 Tablet By Mouth Three Times A Day As Needed Pain 18)  Amlodipine Besylate 5 Mg Tabs (Amlodipine Besylate) .... Take 1 Tablet By  Mouth Once Daily 19)  Trilipix 135 Mg Cpdr (Choline Fenofibrate) .... Take 1 Tablet By Mouth Once A Day  Allergies (verified): 1)  ! * Latex 2)  ! Vancomycin Hcl in Dextrose (Vancomycin Hcl in Dextrose) 3)  ! Accupril  Visit Type:  2 month nurse visit Referring Provider:  . Primary Provider:  Karie Kirks   History of Present Illness: S: 2 month nurse visit B: pt had a nurse visit on 12/24/2009, at that time started amlodipine 5mg  daily, pt was to have a bmp( but was never done) A: pt states that she is drunk and can't form a coherent thought in the mornings , she takes alot of medications that can cause these issues, no other complaints R: sent pt for bmp that was past due   Orders Added: 1)  T-Basic Metabolic Panel 0000000 Have her take the Norvasc at noon, the other medications (lisinopril and HCTZ) in am, to avoid lightheadness.  I hope "drunk" means dizzy and not alcohol induced.   Jory Sims, NP     I spoke with pt and she verbalized understanding of time change  Tye Savoy RN  February 20, 2010 12:50 PM

## 2010-04-23 NOTE — Miscellaneous (Signed)
Summary: labs bmp,04/27/2009  Clinical Lists Changes  Observations: Added new observation of CALCIUM: 9.2 mg/dL (04/27/2009 13:44) Added new observation of CREATININE: 1.59 mg/dL (04/27/2009 13:44) Added new observation of BUN: 27 mg/dL (04/27/2009 13:44) Added new observation of BG RANDOM: 117 mg/dL (04/27/2009 13:44) Added new observation of CO2 PLSM/SER: 27 meq/L (04/27/2009 13:44) Added new observation of CL SERUM: 96 meq/L (04/27/2009 13:44) Added new observation of K SERUM: 4.6 meq/L (04/27/2009 13:44) Added new observation of NA: 134 meq/L (04/27/2009 13:44)

## 2010-04-23 NOTE — Miscellaneous (Signed)
Summary: Orders Update  Clinical Lists Changes  Orders: Added new Test order of T-CBC w/Diff ([id number]) - Signed 

## 2010-04-23 NOTE — Assessment & Plan Note (Signed)
Summary: dropped off stool/ss  pt returned ifobt and it was negative  Allergies: 1)  ! * Latex 2)  ! Vancomycin Hcl in Dextrose (Vancomycin Hcl in Dextrose) 3)  ! Accupril  Other Orders: Immuno-chemical Fecal Occult [id number])

## 2010-05-09 ENCOUNTER — Other Ambulatory Visit (HOSPITAL_COMMUNITY): Payer: Self-pay | Admitting: Family Medicine

## 2010-05-09 ENCOUNTER — Ambulatory Visit (HOSPITAL_COMMUNITY)
Admission: RE | Admit: 2010-05-09 | Discharge: 2010-05-09 | Disposition: A | Discharge status: Home / Self Care | Payer: Medicaid Other | Source: Ambulatory Visit | Attending: Family Medicine | Admitting: Family Medicine

## 2010-05-09 DIAGNOSIS — W19XXXA Unspecified fall, initial encounter: Secondary | ICD-10-CM

## 2010-05-09 DIAGNOSIS — M25519 Pain in unspecified shoulder: Secondary | ICD-10-CM | POA: Insufficient documentation

## 2010-05-09 DIAGNOSIS — M25511 Pain in right shoulder: Secondary | ICD-10-CM

## 2010-06-03 LAB — BASIC METABOLIC PANEL
BUN: 45 mg/dL — ABNORMAL HIGH (ref 6–23)
Creatinine, Ser: 2.65 mg/dL — ABNORMAL HIGH (ref 0.4–1.2)
GFR calc non Af Amer: 18 mL/min — ABNORMAL LOW (ref >60)

## 2010-06-03 LAB — BLOOD GAS, ARTERIAL
Acid-base deficit: 1.3 mmol/L (ref 0.0–2.0)
Bicarbonate: 22.8 mEq/L (ref 20.0–24.0)
TCO2: 20.8 mmol/L (ref 0–100)
pCO2 arterial: 37.8 mmHg (ref 35.0–45.0)

## 2010-06-03 LAB — URINALYSIS, ROUTINE W REFLEX MICROSCOPIC
Glucose, UA: >1000 mg/dL — AB
Ketones, ur: NEGATIVE mg/dL
Leukocytes, UA: NEGATIVE
Specific Gravity, Urine: 1.02 (ref 1.005–1.030)
pH: 5 (ref 5.0–8.0)

## 2010-06-03 LAB — DIFFERENTIAL
Basophils Absolute: 0.1 10*3/uL (ref 0.0–0.1)
Eosinophils Absolute: 0.6 10*3/uL (ref 0.0–0.7)
Eosinophils Relative: 7 % — ABNORMAL HIGH (ref 0–5)
Lymphs Abs: 1.8 10*3/uL (ref 0.7–4.0)

## 2010-06-03 LAB — CK TOTAL AND CKMB (NOT AT ARMC)
CK, MB: 3.2 ng/mL (ref 0.3–4.0)
Relative Index: 1.3 (ref 0.0–2.5)

## 2010-06-03 LAB — CBC
Platelets: 398 10*3/uL (ref 150–400)
RDW: 13.2 % (ref 11.5–15.5)
WBC: 9.2 10*3/uL (ref 4.0–10.5)

## 2010-06-03 LAB — TROPONIN I: Troponin I: 0.03 ng/mL (ref 0.00–0.06)

## 2010-06-03 LAB — URINE MICROSCOPIC-ADD ON

## 2010-06-05 LAB — BASIC METABOLIC PANEL
CO2: 25 mEq/L (ref 19–32)
Calcium: 8.7 mg/dL (ref 8.4–10.5)
Chloride: 102 mEq/L (ref 96–112)
GFR calc Af Amer: 29 mL/min — ABNORMAL LOW (ref >60)
Sodium: 133 mEq/L — ABNORMAL LOW (ref 135–145)

## 2010-06-05 LAB — CBC
Hemoglobin: 9.8 g/dL — ABNORMAL LOW (ref 12.0–15.0)
MCH: 31.5 pg (ref 26.0–34.0)
MCV: 91.1 fL (ref 78.0–100.0)
Platelets: 322 10*3/uL (ref 150–400)
RBC: 3.12 MIL/uL — ABNORMAL LOW (ref 3.87–5.11)
WBC: 9 10*3/uL (ref 4.0–10.5)

## 2010-06-05 LAB — URINALYSIS, ROUTINE W REFLEX MICROSCOPIC
Glucose, UA: >1000 mg/dL — AB
Leukocytes, UA: NEGATIVE
Nitrite: NEGATIVE
Specific Gravity, Urine: 1.015 (ref 1.005–1.030)
pH: 5.5 (ref 5.0–8.0)

## 2010-06-05 LAB — URINE CULTURE: Culture  Setup Time: 201110070349

## 2010-06-05 LAB — DIFFERENTIAL
Eosinophils Absolute: 0.6 10*3/uL (ref 0.0–0.7)
Lymphocytes Relative: 26 % (ref 12–46)
Lymphs Abs: 2.3 10*3/uL (ref 0.7–4.0)
Monocytes Relative: 8 % (ref 3–12)
Neutrophils Relative %: 59 % (ref 43–77)

## 2010-06-05 LAB — URINE MICROSCOPIC-ADD ON

## 2010-06-05 LAB — GLUCOSE, CAPILLARY
Glucose-Capillary: 386 mg/dL — ABNORMAL HIGH (ref 70–99)
Glucose-Capillary: 424 mg/dL — ABNORMAL HIGH (ref 70–99)

## 2010-06-12 LAB — URINALYSIS, ROUTINE W REFLEX MICROSCOPIC
Glucose, UA: NEGATIVE mg/dL
Ketones, ur: NEGATIVE mg/dL
Nitrite: NEGATIVE
Protein, ur: NEGATIVE mg/dL

## 2010-06-12 LAB — CBC
Hemoglobin: 10 g/dL — ABNORMAL LOW (ref 12.0–15.0)
RBC: 3.41 MIL/uL — ABNORMAL LOW (ref 3.87–5.11)
WBC: 9.1 10*3/uL (ref 4.0–10.5)

## 2010-06-12 LAB — BASIC METABOLIC PANEL
BUN: 26 mg/dL — ABNORMAL HIGH (ref 6–23)
CO2: 26 mEq/L (ref 19–32)
Calcium: 9 mg/dL (ref 8.4–10.5)
Chloride: 104 mEq/L (ref 96–112)
Creatinine, Ser: 2.02 mg/dL — ABNORMAL HIGH (ref 0.4–1.2)
Creatinine, Ser: 2.51 mg/dL — ABNORMAL HIGH (ref 0.4–1.2)
GFR calc Af Amer: 24 mL/min — ABNORMAL LOW (ref >60)
GFR calc non Af Amer: 20 mL/min — ABNORMAL LOW (ref >60)
Glucose, Bld: 162 mg/dL — ABNORMAL HIGH (ref 70–99)
Sodium: 133 mEq/L — ABNORMAL LOW (ref 135–145)

## 2010-06-12 LAB — DIFFERENTIAL
Basophils Absolute: 0 10*3/uL (ref 0.0–0.1)
Lymphocytes Relative: 24 % (ref 12–46)
Lymphs Abs: 2.2 10*3/uL (ref 0.7–4.0)
Monocytes Absolute: 0.8 10*3/uL (ref 0.1–1.0)
Monocytes Relative: 9 % (ref 3–12)
Neutro Abs: 5.5 10*3/uL (ref 1.7–7.7)
Neutrophils Relative %: 60 % (ref 43–77)

## 2010-07-09 LAB — CBC
HCT: 29.8 % — ABNORMAL LOW (ref 36.0–46.0)
HCT: 31 % — ABNORMAL LOW (ref 36.0–46.0)
Hemoglobin: 10.1 g/dL — ABNORMAL LOW (ref 12.0–15.0)
MCHC: 33 g/dL (ref 30.0–36.0)
MCHC: 33.8 g/dL (ref 30.0–36.0)
MCHC: 34.7 g/dL (ref 30.0–36.0)
MCHC: 34.7 g/dL (ref 30.0–36.0)
MCHC: 34.7 g/dL (ref 30.0–36.0)
MCV: 84.6 fL (ref 78.0–100.0)
MCV: 85.4 fL (ref 78.0–100.0)
Platelets: 369 10*3/uL (ref 150–400)
Platelets: 376 10*3/uL (ref 150–400)
Platelets: 399 10*3/uL (ref 150–400)
RBC: 3.45 MIL/uL — ABNORMAL LOW (ref 3.87–5.11)
RBC: 3.54 MIL/uL — ABNORMAL LOW (ref 3.87–5.11)
RDW: 13.1 % (ref 11.5–15.5)
RDW: 13.2 % (ref 11.5–15.5)
RDW: 13.4 % (ref 11.5–15.5)
WBC: 8.2 10*3/uL (ref 4.0–10.5)
WBC: 8.3 10*3/uL (ref 4.0–10.5)
WBC: 9.8 10*3/uL (ref 4.0–10.5)

## 2010-07-09 LAB — DIFFERENTIAL
Basophils Absolute: 0 10*3/uL (ref 0.0–0.1)
Basophils Absolute: 0.1 10*3/uL (ref 0.0–0.1)
Basophils Absolute: 0.2 10*3/uL — ABNORMAL HIGH (ref 0.0–0.1)
Basophils Relative: 1 % (ref 0–1)
Basophils Relative: 1 % (ref 0–1)
Basophils Relative: 1 % (ref 0–1)
Basophils Relative: 1 % (ref 0–1)
Basophils Relative: 2 % — ABNORMAL HIGH (ref 0–1)
Eosinophils Absolute: 0.3 10*3/uL (ref 0.0–0.7)
Eosinophils Absolute: 0.3 10*3/uL (ref 0.0–0.7)
Eosinophils Relative: 4 % (ref 0–5)
Lymphocytes Relative: 16 % (ref 12–46)
Monocytes Absolute: 0.9 10*3/uL (ref 0.1–1.0)
Monocytes Relative: 11 % (ref 3–12)
Monocytes Relative: 11 % (ref 3–12)
Monocytes Relative: 8 % (ref 3–12)
Neutro Abs: 4 10*3/uL (ref 1.7–7.7)
Neutro Abs: 4.1 10*3/uL (ref 1.7–7.7)
Neutro Abs: 6.1 10*3/uL (ref 1.7–7.7)
Neutro Abs: 8.8 10*3/uL — ABNORMAL HIGH (ref 1.7–7.7)
Neutrophils Relative %: 50 % (ref 43–77)
Neutrophils Relative %: 53 % (ref 43–77)
Neutrophils Relative %: 62 % (ref 43–77)
Neutrophils Relative %: 74 % (ref 43–77)

## 2010-07-09 LAB — COMPREHENSIVE METABOLIC PANEL
Alkaline Phosphatase: 89 U/L (ref 39–117)
BUN: 14 mg/dL (ref 6–23)
Glucose, Bld: 115 mg/dL — ABNORMAL HIGH (ref 70–99)
Potassium: 2.7 mEq/L — CL (ref 3.5–5.1)
Total Bilirubin: 0.2 mg/dL — ABNORMAL LOW (ref 0.3–1.2)
Total Protein: 6.3 g/dL (ref 6.0–8.3)

## 2010-07-09 LAB — BASIC METABOLIC PANEL
BUN: 14 mg/dL (ref 6–23)
BUN: 14 mg/dL (ref 6–23)
CO2: 25 mEq/L (ref 19–32)
CO2: 26 mEq/L (ref 19–32)
CO2: 27 mEq/L (ref 19–32)
CO2: 30 mEq/L (ref 19–32)
Calcium: 8.5 mg/dL (ref 8.4–10.5)
Calcium: 8.6 mg/dL (ref 8.4–10.5)
Calcium: 9.5 mg/dL (ref 8.4–10.5)
Chloride: 105 mEq/L (ref 96–112)
Chloride: 106 mEq/L (ref 96–112)
Creatinine, Ser: 1.62 mg/dL — ABNORMAL HIGH (ref 0.4–1.2)
Creatinine, Ser: 1.81 mg/dL — ABNORMAL HIGH (ref 0.4–1.2)
Creatinine, Ser: 1.9 mg/dL — ABNORMAL HIGH (ref 0.4–1.2)
GFR calc Af Amer: 33 mL/min — ABNORMAL LOW (ref >60)
GFR calc Af Amer: 39 mL/min — ABNORMAL LOW (ref >60)
GFR calc Af Amer: 39 mL/min — ABNORMAL LOW (ref >60)
GFR calc non Af Amer: 27 mL/min — ABNORMAL LOW (ref >60)
Glucose, Bld: 114 mg/dL — ABNORMAL HIGH (ref 70–99)
Glucose, Bld: 131 mg/dL — ABNORMAL HIGH (ref 70–99)
Glucose, Bld: 181 mg/dL — ABNORMAL HIGH (ref 70–99)
Potassium: 4.8 mEq/L (ref 3.5–5.1)
Sodium: 139 mEq/L (ref 135–145)

## 2010-07-09 LAB — WOUND CULTURE

## 2010-07-09 LAB — LIPID PANEL
HDL: 21 mg/dL — ABNORMAL LOW (ref >39)
LDL Cholesterol: 71 mg/dL (ref 0–99)
Total CHOL/HDL Ratio: 7.1 RATIO
Triglycerides: 287 mg/dL — ABNORMAL HIGH (ref <150)
VLDL: 57 mg/dL — ABNORMAL HIGH (ref 0–40)

## 2010-07-09 LAB — CULTURE, ROUTINE-ABSCESS

## 2010-07-09 LAB — URINALYSIS, ROUTINE W REFLEX MICROSCOPIC
Glucose, UA: NEGATIVE mg/dL
Hgb urine dipstick: NEGATIVE
Protein, ur: NEGATIVE mg/dL
Specific Gravity, Urine: 1.01 (ref 1.005–1.030)
pH: 5.5 (ref 5.0–8.0)

## 2010-07-09 LAB — ANAEROBIC CULTURE

## 2010-07-09 LAB — GLUCOSE, CAPILLARY
Glucose-Capillary: 111 mg/dL — ABNORMAL HIGH (ref 70–99)
Glucose-Capillary: 120 mg/dL — ABNORMAL HIGH (ref 70–99)
Glucose-Capillary: 120 mg/dL — ABNORMAL HIGH (ref 70–99)
Glucose-Capillary: 123 mg/dL — ABNORMAL HIGH (ref 70–99)
Glucose-Capillary: 142 mg/dL — ABNORMAL HIGH (ref 70–99)
Glucose-Capillary: 155 mg/dL — ABNORMAL HIGH (ref 70–99)
Glucose-Capillary: 182 mg/dL — ABNORMAL HIGH (ref 70–99)
Glucose-Capillary: 187 mg/dL — ABNORMAL HIGH (ref 70–99)
Glucose-Capillary: 201 mg/dL — ABNORMAL HIGH (ref 70–99)
Glucose-Capillary: 98 mg/dL (ref 70–99)

## 2010-07-09 LAB — CREATININE CLEARANCE, URINE, 24 HOUR
Collection Interval-CRCL: 24 hours
Creatinine Clearance: 42 mL/min — ABNORMAL LOW (ref 75–115)
Creatinine, 24H Ur: 1096 mg/d (ref 700–1800)

## 2010-07-09 LAB — PROTEIN, URINE, 24 HOUR
Collection Interval-UPROT: 24 hours
Urine Total Volume-UPROT: 2750 mL

## 2010-07-09 LAB — IRON AND TIBC
Iron: 49 ug/dL (ref 42–135)
TIBC: 238 ug/dL — ABNORMAL LOW (ref 250–470)

## 2010-07-09 LAB — URINE MICROSCOPIC-ADD ON

## 2010-07-09 LAB — CULTURE, BLOOD (ROUTINE X 2)
Culture: NO GROWTH
Report Status: 2272010

## 2010-07-09 LAB — HEMOGLOBIN A1C: Hgb A1c MFr Bld: 7.1 % — ABNORMAL HIGH (ref 4.6–6.1)

## 2010-07-09 LAB — CK: Total CK: 53 U/L (ref 7–177)

## 2010-08-06 NOTE — H&P (Signed)
NAMELEILONI, LENARZ NO.:  [id number]   MEDICAL RECORD NO.:  [id number]          PATIENT TYPE:  INP   LOCATION:  A311                          FACILITY:  [facility]   PHYSICIAN:  Salem Caster, DO    DATE OF BIRTH:  05-31-1950   DATE OF ADMISSION:  05/14/2008  DATE OF DISCHARGE:  LH                              HISTORY & PHYSICAL   PRIMARY CARE PHYSICIAN:  None.   CHIEF COMPLAINT:  Cellulitis.   HISTORY OF PRESENT ILLNESS:  This is a 60 year old Caucasian female who  recently moved from [place] who presents with a 2-week history of rash  to her neck and cellulitis in the nape of her neck.  The patient states  that approximately 2 weeks ago she noticed a rash.  She has been  applying neosporin.  States that the rash improved, the patient started  noting elevated lesions on her back.  The patient states, a few days  prior, one of her friends removed a scab from 1 of the lesions and that  one improved on the left side of her back, but she has a lump that is  hot and red on the right side that is tender and erythematous.  Secondary to the pain and some associated right arm pain, the patient  decided to come to the emergency room to be evaluated.   PAST MEDICAL HISTORY:  Includes non-insulin-dependent diabetes,  hypertension, hypothyroidism, hyperlipidemia.   SURGICAL HISTORY:  Appendectomy, tubal ligation, hand and foot surgery,  as well as shoulder surgery.   SOCIAL HISTORY:  The patient has a history of smoking.  No history of  alcohol or illicit drug abuse.   FAMILY HISTORY:  Noncontributory.   ALLERGIES:  ACCUPRIL, LASIX.   HOME MEDICATIONS:  1. Celexa 20 mg daily.  2. Gabapentin 800 mg twice a day.  3. Humalog subcutaneous 75/25 as needed.  4. Hydrochlorothiazide 25 mg daily.  5. Levothyroxine 50 mg daily.  6. Lisinopril 10 mg 1/2 tab daily.  7. Metformin 1000 mg twice a day.  8. Omeprazole 20 mg twice a day.  9. Pravastatin 20 mg once a day.   REVIEW OF SYSTEMS:  GENERAL:  No other fevers or chills, weight gain,  weight loss.  HEENT:  Unremarkable.  CARDIOVASCULAR:  She is complaining  of some right-sided chest discomfort with radiation to her right arm.  No palpitations.  RESPIRATORY:  No cough or shortness of breath.  GASTROINTESTINAL:  No nausea, vomiting, or diarrhea, abdominal pain.  MUSCULOSKELETAL:  She complains of right arm tingling.   LABORATORY STUDIES:  Sodium 131, potassium 3.0, chloride 96, CO2 25,  glucose 131, BUN 18, creatinine 1.90, white count 11,900, hemoglobin  12.9, hematocrit 39.0, platelet count is 470,000.   RADIOLOGIC STUDIES:  CT without contrast of the neck shows a focal area  of soft tissue thickening with subcutaneous fat in the right posterior  neck.  There is overlying cellulitis.  No well-defined abscesses seen.  Right thyroid nodule, recommend routine inpatient followup ultrasound.   ASSESSMENT AND PLAN:  1. Cellulitis to the nape of  her neck.  For this, we will place the      patient on IV vancomycin and Zosyn.  Will get blood cultures also      at this time.  Will apply warm compresses as needed.  2. For her renal insufficiency, will hydrate the patient at this time.      Will get a nephrology consult secondary to her history of diabetes.      Will continue to follow her BUN and creatinine.  3. Hypokalemia.  The patient did receive oral potassium in the      emergency room.  Will continue to follow and replenish as needed.  4. For her diabetes, the patient will be placed on sliding scale as      well as her home metformin.  The patient's blood sugar will be      checked AC and at bedtime.  Will add a hemoglobin A1c to her a.m.      labs.  5. The patient will be on DVT as well as GI prophylaxis.      Salem Caster, DO  Electronically Signed     SM/MEDQ  D:  05/14/2008  T:  05/14/2008  Job:  [id number]

## 2010-08-06 NOTE — Group Therapy Note (Signed)
NAMEMONIYA, MEDITZ NO.:  [id number]   MEDICAL RECORD NO.:  [id number]          PATIENT TYPE:  INP   LOCATION:  A311                          FACILITY:  [facility]   PHYSICIAN:  Salem Caster, DO    DATE OF BIRTH:  1950-12-11   DATE OF PROCEDURE:  05/16/2008  DATE OF DISCHARGE:                                 PROGRESS NOTE   SUBJECTIVE:  Ms. Amman states that her cellulitis on her back is still  painful and has had no relief.  Patient did have warm compresses applied  and states that continues to hurt.  Overall, patient has no other  complaints at this time.   OBJECTIVE:  VITAL SIGNS:  Temperature is 98.0.  Pulse 60.  Respirations  16.  Blood pressure 116/76.  CARDIOVASCULAR:  Regular rate and rhythm.  No murmurs, rubs, or gallops.  LUNGS:  Clear to auscultation bilaterally.  No rales, rhonchi, or  wheezing.  ABDOMEN:  Soft, nontender, nondistended.  Positive bowel sounds.  No  rigidity or guarding.  EXTREMITIES:  No clubbing, cyanosis, or edema.  SKIN:  Patient has an area of induration noted on her right side of her  back below her neck that is raised, red, no obvious drainage at this  time but tender on palpation.  It appears to be unchanged from 1 day  previous.   LABS:  So far blood cultures are unremarkable.  Wound culture, few WBCs,  predominately PMNs, no organisms seen.  Sodium 139, potassium 3.6,  chloride 105, CO2 is 27, glucose 114, BUN 14, creatinine 1.81, white  count is 8200, hemoglobin 10.3, hematocrit 29.7, platelet count 369,000.  Patient had an ultrasound of her kidneys which showed slight asymmetry  of renal size as right kidney is smaller than left.  It showed a 13 x 9-  mm diameter soft tissue nodule, left bladder base potentially  representing a ureterocele but a small bladder mass not excluded.  Correlation with cystoscopy is recommended.   ASSESSMENT AND PLAN:  1. Cellulitis.  We will continue patient on IV antibiotics.  Her white    count seems to be improving.  We will continue with warm compresses      2 to 3 times daily.  We will get a general surgical consult      regarding if any intervention is needed at this time.  2. Renal failure.  Patient is being followed by nephrology.  At this      time, her renal function seems to be slowly improving.  We will      hold all ACE inhibitors at this time.  3. Diabetes.  Patient will continue with sliding scale.  Her metformin      is on hold secondary to her renal function.  4. Patient will be continued on deep venous thrombosis and      gastrointestinal prophylaxis.      Salem Caster, DO  Electronically Signed     SM/MEDQ  D:  05/16/2008  T:  05/16/2008  Job:  [id number]

## 2010-08-06 NOTE — Consult Note (Signed)
NAME:  [name], [name]                 ACCOUNT NO.:  [id number]   MEDICAL RECORD NO.:  [id number]          PATIENT TYPE:  INP   LOCATION:  A311                          FACILITY:  [facility]   PHYSICIAN:  Alison Murray, M.D.DATE OF BIRTH:  Feb 08, 1951   DATE OF CONSULTATION:  DATE OF DISCHARGE:                                 CONSULTATION   REASON FOR CONSULTATION:  Elevated BUN and creatinine.  The patient  admitted by the Hospitalist because of history of neck cellulitis.  Presently, consult is called because of elevated BUN and creatinine.  Ms. Mazanec with a history of diabetes for 15 years, uncontrolled  available of lack of medication.  Presently came with a 2-week history  of skin rash, then followed by swelling on her back and some erythema.  She was seen, she was found to have cellulitis, and admitted to the  hospital.  She denies any previous history of diabetic retinopathy.  However, she has decreased vision on both sides and she was told  __________ an Neurosurgeon.  However, the patient denies any history of  bleeding or any history of diabetic retinopathy.  She denies also any  history of kidney stones and chronic renal failure or diabetic  neuropathy and she denies also any swelling of her legs.   PAST MEDICAL HISTORY:  1. She has history of diabetes for more than 15 years.  2. Hypertension on and off for a couple of years.  3. History of hypothyroidism.  4. History of hyperlipidemia.  5. History of GERD.  6. History of obesity.   According to the patient, she has lost about 25-70 pounds within the  last 2 years.   SURGICAL HISTORY:  She has a history of appendectomy, history of partial  hysterectomy with tubal ligation, history of her shoulder surgery,  history of carpal tunnel surgery, and also history of eye surgery.   ALLERGIES:  She is allergic to LASIX and ACCUPRIL.   Her medications at this moment consist of;  1. Celexa 20 mg p.o. daily.  2. Lovenox 40 mg subcu  q.24 h.  3. Neurontin 800 mg p.o. b.i.d.  4. Novolin insulin.  5. Synthroid 50 mcg p.o. daily.  6. Protonix 40 mg p.o. daily.  7. Zosyn 0.375 mg IV q.8 h.  8. Potassium 40 mEq daily.  9. Zocor 10 mg p.o. daily.  10.IV fluids, running at 125 mL normal saline.   Other medications at this moment are p.r.n. medications.  Otherwise, the  patient was on hydrochlorothiazide, but presently discontinued.   FAMILY HISTORY:  There is no history of renal failure, but her father  died of colonic CA, but he has history of __________ COPD at the age of  28.   SOCIAL HISTORY:  She has a history of smoking.  No history of alcohol  abuse.   REVIEW OF SYSTEMS:  The patient denies any fevers, chills, sweating,  nausea, or vomiting.  She had a history of skin rash on her neck then  followed by cellulitis.  Presently, the patient feels she does not offer  any complaints.  She does not have any shortness of breath, dizziness,  or lightheadedness.  She denies any fevers, chills, or sweats.  She  denies also any diarrhea.  No urgency or frequency.  __________ she was  found to have urinary tract infection about 8 months ago, treated with  antibiotics.   PHYSICAL EXAMINATION:  GENERAL:  The patient is alert and no apparent  distress.  VITAL SIGNS:  Her blood pressure is 116/59, pulse of 69, temperature of  98.2, and respiratory rate is 18.  HEENT:  No conjunctival pallor.  No icterus.  Oral mucosa seems to be  moist.  CHEST:  Clear to auscultation.  No rales.  No rhonchi.  No egophony.  She has small swollen area on her shoulder to the left side and  otherwise no other finding.  HEART:  Regular rate and rhythm.  No murmur.  No S3.  ABDOMEN:  Soft.  Positive bowel sounds.  EXTREMITIES:  No edema.   Her white blood cell count is 9.8, hemoglobin 10.8, hematocrit, and her  platelet is 399.  Her sodium is 176, potassium 2.7, BUN is 14,  creatinine 1.66, glucose of 15, and CO2 of 28.  Her albumin is 2.9  and  calcium is 8.1.  When she came her BUN was 18 and creatinine 1.90.   ASSESSMENT:  1. Renal insufficiency possibly acute at this moment.  She does not      have any history of chronic renal failure.  No history of diabetic      retinopathy, but seems to have a neuropathy.  At this moment, I      also do not have any UA, it is not sure whether the patient has      proteinuria.  Since the patient has been on diuretics probably she      might have prerenal syndrome, which seems to be improving.  2. History of diabetes longstanding.  She has been on hypoglycemic      agent.  Her blood sugar was now controlled.  She states that      usually it runs around 200 to 300 perhaps for some time, it has      been around 500-600 area.  3. History of hypertension, blood pressure seems to be controlled very      well.  4. History of hypothyroidism.  She is on Synthroid.  She is __________  5. History of hyperlipidemia.  6. History of gastroesophageal reflux disease.  She is on Protonix.  7. History of obesity.  The patient has lost significant weight      according to her.  8. History of hypokalemia possibly associated to hydrochlorothiazide      that has been discontinued.  9. History of cellulitis __________ she is on antibiotics.  She is      afebrile.  Her white blood count has also improved.  10.Recent history of urinary tract infection.   RECOMMENDATIONS:  We will check her urine.  If she has proteinuria, then  probably will do 24 hours urine for protein and immunoelectrophoresis.  We will continue with hydration.  We will do ultrasound of kidneys  because of  elevated BUN and creatinine.  We will replace her potassium probably  will add it into the IV fluids.  Agree with discontinuation of her  hydrochlorothiazide.  If she has proteinuria, probably she may benefit  from ARBs.  At this moment, we will try to avoid ACE inhibitor because  of his history of  allergy to ACCUPRIL.        Alison Murray, M.D.  Electronically Signed     BB/MEDQ  D:  05/15/2008  T:  05/15/2008  Job:  [id number]

## 2010-08-06 NOTE — Discharge Summary (Signed)
[name], [name] NO.:  [id number]   MEDICAL RECORD NO.:  [id number]          PATIENT TYPE:  INP   LOCATION:  A311                          FACILITY:  [facility]   PHYSICIAN:  Bonnielee Haff, MD     DATE OF BIRTH:  01-Apr-1950   DATE OF ADMISSION:  05/14/2008  DATE OF DISCHARGE:  02/25/2010LH                               DISCHARGE SUMMARY   The [facility].   Please review H and P dictated by Dr. Sherryle Lis for details regarding the  patient's presenting illness.   DISCHARGE DIAGNOSES:  1. Neck abscess, methicillin-resistant Staphylococcus aureus,      improved.  2. Diabetes.  3. Chronic kidney disease with a suspicion for renal artery stenosis.  4. Possible bladder mass.  5. Hypothyroidism.   HOSPITAL COURSE:  Briefly, this is a 60 year old Caucasian female who  presents with a 2 week history of rash to her neck.  This was in the  posterior aspect of her neck.  The patient was found to have a  cellulitis/abscess over there.  The patient was put on Zosyn and given  vancomycin in the ED.  She was also found to have an elevated white  count of 11,900.  Culture sensitivities were sent from the wound.  This  is growing MRSA sensitive to tetracycline.  She was also quite  significantly hypokalemic on admission which was repleted.  She was seen  by Dr. Arnoldo Morale who took her to the OR for I&D.  Dressing changes are  being done and wound care instructions have been provided by Dr.  Arnoldo Morale.   She was also found to have an elevated creatinine of 1.9.  An ultrasound  of the renal system was done which revealed asymmetry of the kidney  sizes, right kidney is smaller than the left.  Dr. Lowanda Foster saw her and  thought that she may require Doppler study to rule out renal artery  stenosis.  This can be done as an outpatient and in fact, we will go  ahead and set up an appointment with Dr. Lowanda Foster in the next few weeks.  But  more importantly, the ultrasound suggested a possibility of soft  tissue nodule at the left urinary bladder base.  So urology consultation  will be obtained as an outpatient.  She may require a cystoscopy.  This  has been communicated to the patient.   She also has diabetes which has been reasonably controlled.  HbA1c was  7.1.  She is on metformin, however, she should not take it because of  her renal insufficiency.  I will switch her over to Actos.  She also  takes insulin as needed when her blood sugar goes above 200.  This is  being managed by her primary care providers.   PHYSICAL EXAMINATION:  GENERAL:  On the day of discharge, the patient is  feeling well.  She is keen on going home.  VITAL SIGNS:  Are all stable.  She is afebrile.  NECK:  Her neck wound actually looking fine.  Erythema still  persists,  but is much better.  Dr. Arnoldo Morale has seen her this morning and cleared  her for discharge.  Rest of the exam was unremarkable.   DISCHARGE MEDICATIONS:  1. Celexa 20 mg daily.  2. Gabapentin 800 mg b.i.d.  3. HCTZ 25 mg daily.  4. Levothyroxine 50 mcg daily.  5. Lisinopril 5 mg daily.  6. Omeprazole 20 mg b.i.d.  7. Pravastatin 20 mg daily.  8. Humalog 75/25 as before.   Metformin was discontinued.   NEW MEDICATIONS:  1. Doxycycline 100 mg b.i.d. for 7 days.  2. Actos 30 mg daily. ***SEE ADDENDUM***   FOLLOW UP:  1. Follow up with Dr. Michela Pitcher for her bladder mass.  2. Dr. Lowanda Foster for renal artery stenosis.  3. Dr. Arnoldo Morale for her cellulitis and abscess.   DIET:  Modified carbohydrate.   ACTIVITY:  Physical activity as before.   PROCEDURE:  Surgical I&D of the neck abscess.   DIAGNOSTICS:  1. CT neck which showed an area of inflammation in the neck and a      right thyroid nodule for which she will require an outpatient      ultrasound.  2. Chest x-ray which revealed no significant abnormalities.   Total time on this discharge encounter is about 35 minutes.    ADDENDUM: Patient could not afford the Actos and she requested a  medication available at the [facility] $4 rate. So she was prescribed  Glipizide 5mg  PO BID      Bonnielee Haff, MD  Electronically Signed     GK/MEDQ  D:  05/18/2008  T:  05/18/2008  Job:  [id number]   cc:   Jana Hakim, M.D.  Fax: [phone]   Alison Murray, M.D.  [phone]   Marissa Nestle, M.D.  Fax: [place]

## 2010-08-06 NOTE — Op Note (Signed)
[name], [name]                 ACCOUNT NO.:  [id number]   MEDICAL RECORD NO.:  [id number]          PATIENT TYPE:  AMB   LOCATION:  DAY                           FACILITY:  [facility]   PHYSICIAN:  Jamesetta So, M.D.  DATE OF BIRTH:  1951/03/17   DATE OF PROCEDURE:  05/17/2008  DATE OF DISCHARGE:                               OPERATIVE REPORT   PREOPERATIVE DIAGNOSIS:  Abscess, neck.   POSTOPERATIVE DIAGNOSIS:  Abscess, neck.   PROCEDURE:  Incision and drainage of abscess, neck.   SURGEON:  Jamesetta So, MD   ANESTHESIA:  General endotracheal.   INDICATIONS:  The patient is a 60 year old white female with multiple  medical problems who presents with a developing abscess in the posterior  aspect of her neck.  The patient also has multiple freckles present  throughout her body.  She has been restarted on IV antibiotics for  presumed staph infection.  She now comes to the operating room for  incision and drainage of the neck abscess.  The risks and benefits of  the procedure including bleeding and infection were fully explained to  the patient, gave informed consent.   PROCEDURE NOTE:  The patient was placed in the left lateral decubitus  position after induction of general endotracheal anesthesia.  The neck  was prepped and draped in the usual sterile technique with Betadine.  Surgical site confirmation was performed.   A transverse incision was made over the right posterior abscess cavity.  Purulent fluid was found.  Both anaerobic and aerobic cultures were  taken and sent to Microbiology.  The wound was irrigated with normal  saline.  Any necrotic tissue was debrided.  The wound was then packed  with 1/2-inch Nu Gauze with iodoform present.  Sensorcaine 0.5% was  instilled into the surrounding wound.  A dry sterile dressing was  applied.   All tape and needle counts were correct at the end of the procedure.  The patient was extubated in the operating room and went back  to  recovery room awake in stable condition.   COMPLICATIONS:  None.   SPECIMEN:  Aerobic and anaerobic cultures.   BLOOD LOSS:  Minimal.      Jamesetta So, M.D.  Electronically Signed     MAJ/MEDQ  D:  05/17/2008  T:  05/17/2008  Job:  [id number]

## 2010-08-09 NOTE — H&P (Signed)
[name], [name] NO.:  [id number]   MEDICAL RECORD NO.:  [id number]          PATIENT TYPE:  OBV   LOCATION:  A216                          FACILITY:  [facility]   PHYSICIAN:  Bonnielee Haff, MD     DATE OF BIRTH:  04-13-1950   DATE OF ADMISSION:  06/29/2005  DATE OF DISCHARGE:  LH                                HISTORY & PHYSICAL   The patient goes to the [facility] Department, does not have a  PMD.   ADMISSION DIAGNOSES:  1.  Chest pain, rule out acute coronary syndrome.  2.  Type 2 diabetes.  3.  Obesity.  4.  History of abnormal Cardiolite 4 years ago.   CHIEF COMPLAINT:  Left-sided chest pain.   HISTORY OF PRESENT ILLNESS:  The patient is a 60 year old Caucasian female  who has a past medical history of type 2 diabetes and obesity who presented  to the ED today with the onset of sharp left-sided chest pain.  The patient  mentioned that she has been having pains for the past 2 months.  The pain is  described as left-sided, sometimes sharp, sometimes feels like a tightness,  but spontaneously resolves.  This afternoon, at about 12:30 she was at home,  sitting on the couch when she experienced left-sided chest pain; but the  pain subsided, spontaneously, quickly.  She did not pay much attention to  this pain and was driving after half an hour, when she re-experienced the  pain, again.  The pain was described as 10/10 in intensity.  It stayed to  the left side of the chest and not radiating anywhere.  She did not feel any  shortness of breath, but did have some palpitations. No clear history of  diaphoresis.  She did not have any nausea or vomiting.  No history of fever  or chills at home.  No history of cough.  The pain was worse and she decided  to come into the ED.  In the ED she was given nitroglycerin when initially  she had an increase in her pain, but then it subsequently subsided.  A  second episode of pain lasted about 2 to 2-1/2 hours.   The patient mentions  that she has had been having similar pains, but not of the intensity that  she had today, for about the past 2 months.  The patient does not like to  see doctors or go to the hospital and she did not seek attention for this.  She has not been taking any medications for this pain as well.  She also  mentioned that she had some visual blurring at baseline for which she has  not followed with the ophthalmologist recently.  Today when she had the pain  she experienced some lightheadedness as well.  No history of any syncopal  episode.   MEDICATIONS AT HOME:  1.  Glipizide 10 mg once a day, occasionally she takes it twice a day.  2.  Tylenol as needed.   ALLERGIES:  1.  ACCUPRIL causes swelling and redness of her  wrist.  2.  LATEX causes anaphylactic reaction.   PAST MEDICAL HISTORY:  Type 2 diabetes is the main concern.  History of  obesity.  Based on her electronic medical record, here, she did have a  nuclear Cardiolite stress test back in 2003 which showed nonreversible area  of decreased perfusion in the posterior septal wall of the left ventricle;  normal left ventricular ejection fraction of 54% was appreciated.  Mild  hypokinesia of the inferior septal wall of the left ventricle was noted.  She has never had an angiogram.   PAST SURGICAL HISTORY:  Partial hysterectomy, foot surgeries, hand  surgeries, shoulder surgeries, and carpal tunnel surgery.   SOCIAL HISTORY:  She lives in [place] with her fiance.  She works part  time in the [employer].  No history of smoking, alcohol, or drug use.   FAMILY HISTORY:  Father died of colon cancer, had diabetes.  Mother died of  emphysema.  Grandmother had CHF, and other heart disease.  Grandfather had  lung cancer.  One of her brothers died in a motor vehicle accident.  Other  siblings are alive and healthy.  No other history of coronary artery  disease, or strokes in the family.   REVIEW OF SYSTEMS:   Unremarkable except as mentioned in the HPI.   PHYSICAL EXAMINATION:  VITAL SIGNS:  Temperature 97.1, blood pressure  196/90, heart rate 85, respiratory rate is 18; saturating 96% on 2 liters by  nasal cannula.  GENERAL:  This is an obese, white female in no apparent distress.  HEENT:  There is no pallor, no icterus.  Oral mucosa membranes are moist.  No oral lesions are noted.  NECK:  Soft and supple.  No thyromegaly appreciated.  LUNGS:  Clear to auscultation bilaterally.  No wheezing rales or rhonchi.  CARDIOVASCULAR:  S1-S2 normal regular.  No murmurs appreciated.  No S3, S4,  no rubs, no bruits heard.  ABDOMEN:  Obese, nontender, nondistended.  Bowel sounds are present.  No  masses or organomegaly appreciated.  EXTREMITIES:  Without edema.  Peripheral pulses are palpable.  NEUROLOGIC:  The patient is alert, oriented x3.  No focal or neurological  deficits appreciated.   LABORATORY DATA:  CBC is completely unremarkable.  BNP shows sodium 133,  potassium 3.2, glucose 267, other parameters normal.  One set of cardiac  enzymes negative.  UA shows urine glucose otherwise unremarkable.   EKG:  Shows poor quality EKG with waving baseline, but sinus rhythm with  normal axis.  Intervals appear to be within normal range.  No Q waves  appreciated in any of the leads.  No concerning ST or T wave changes on this  EKG.  Another EKG will have to be repeated.   CHEST X-RAY:  Does not show any acute cardiopulmonary process.   IMPRESSION:  This is a 60 year old Caucasian female with a history of  obesity.  She presents with left-sided chest pain. The chest pain apparently  has resolved with nitroglycerin, however, the symptoms are very atypical for  cardiac etiology, but she is a woman with diabetes.  She also appears to  have hypertension based on her blood pressure readings in the ED. Differential diagnosis obviously includes angina versus acute coronary  syndrome.  She does have some degree  of abnormal Cardiolite stress test done  about 4 years ago.  Other differential includes acid reflux disease which is  less likely.  Musculoskeletal pain is also a possibility.  PE is, again,  less likely considering prolonged duration of her symptoms and no shortness  of breath.   PLAN:  1.  Chest pain, observe in the hospital on the telemetry floor.  Do serial      cardiac enzymes.  Check a lipid profile in the morning.  Do an      echocardiogram, repeat EKGs and consult [facility] to consider a      stress test either as inpatient or outpatient.  We will keep the patient      NPO past midnight if they do decide to do the stress test tomorrow.  2.  Potassium will be replaced.  A TSH level will be checked.  A hemoglobin      A1c will be checked.  She will be put on sliding scale.  Glipizide will      be continued except for tomorrow when she will be NPO.  3.  DVT prophylaxis will be initiated.      Bonnielee Haff, MD  Electronically Signed     GK/MEDQ  D:  06/29/2005  T:  06/29/2005  Job:  [id number]

## 2010-08-09 NOTE — Discharge Summary (Signed)
[facility]  Patient:    [name], POLCZYNSKI Visit Number: [id number] MRN: [id number]          Service Type: MED Location: 2A A210 01 Attending Physician:  Bonne Dolores Dictated by:   Bonne Dolores, M.D. Admit Date:  04/05/2001 Discharge Date: 04/08/2001                             Discharge Summary  DISCHARGE DIAGNOSES: 1. Atypical chest pain, Cardiolite negative.  Rule out myocardial infarction    protocol negative. 2. Hypertension-controlled. 3. Diabetes mellitus type 2. 4. Morbid obesity.  HISTORY OF PRESENT ILLNESS:  For details regarding admission, please refer to admitting note.  Briefly, this 60 year old female with the above history presented to the emergency department for evaluation of a six-hour history of progressively worsening stabbing left anterior chest pain and left arm numbness which was partially pleuritic.  She also described heaviness in her substernal region.  She was given nitroglycerin in the emergency department with prompt and complete resolution of her pain.  Initial evaluation was negative.  She was admitted for further evaluation and therapy of chest pain.  HOSPITAL COURSE:  The patient was admitted for routine rule out MI protocol and chest pain.  A rule out MI protocol was negative.  She underwent a stress Cardiolite.  There was a slight amount of ST depression noted on EKG; however, Cardiolite study revealed a fixed defect of the posterior septal wall with no ischemia.  Ejection fraction is 54%.  The patient had no further symptoms and was doing very well.  She was discharged home in improved condition.  She had no recurrent chest pain or other symptoms.  DISCHARGE INSTRUCTIONS:  She is to begin aspirin one a day.  Follow her blood sugars.  Should be treated expectantly as an outpatient. Dictated by:   Bonne Dolores, M.D. Attending Physician:  Bonne Dolores DD:  05/13/01 TD:  05/13/01 Job: [id number]

## 2010-08-09 NOTE — Op Note (Signed)
   NAME:  [name], [name]                           ACCOUNT NO.:  [id number]   MEDICAL RECORD NO.:  [id number]                   PATIENT TYPE:  AMB   LOCATION:  DAY                                  FACILITY:  [facility]   PHYSICIAN:  Hildred Laser, M.D.                 DATE OF BIRTH:  [birth year]   DATE OF PROCEDURE:  03/03/2002  DATE OF DISCHARGE:                                 OPERATIVE REPORT   PROCEDURE:  Total colonoscopy.   INDICATIONS:  The patient is a 60 year old Caucasian female with a personal  history of colonic polyps.  Last exam was seven years ago while she was in  [place].  She presently does not have any GI symptoms other than  constipation.  The procedure was reviewed with the patient, and informed  consent was obtained.   PREMEDICATION:  Demerol 50 mg IV, Versed 6 mg IV in divided dose.   INSTRUMENT USED:  Olympus video system.   FINDINGS:  Procedure performed in endoscopy suite.  The patient's vital  signs and O2 saturation were monitored during procedure and remained stable.  The patient was placed in the left lateral recumbent position and rectal  examination performed.  No abnormality noted on external or digital exam.  The scope was placed in the rectum and advanced under vision into the  sigmoid colon and beyond.  Preparation was excellent.  The scope was passed  to the cecum, which was identified by the ileocecal valve and the  appendiceal orifice.  Pictures were taken for the record and part of our  data base.  As the scope was withdrawn, colonic mucosa was carefully  examined.  A few small diverticula were noted in the sigmoid colon, but  there was no evidence of polyps or tumor mass.  The rectal mucosa was  normal.  The scope was retroflexed to examine the anorectal junction, and  small to moderate-sized hemorrhoids were noted below the dentate line.  The  endoscope was straightened and withdrawn.  The patient tolerated the  procedure well.   FINAL  DIAGNOSES:  Normal colonoscopy except few small diverticula at sigmoid  colon and external hemorrhoids.   RECOMMENDATIONS:  1. A high-fiber diet.  2. Citrucel one tablespoonful daily.  3. Yearly Hemoccults.  4. She should return for complete colonoscopy five years from now.                                               Hildred Laser, M.D.   NR/MEDQ  D:  03/03/2002  T:  03/03/2002  Job:  [id number]   cc:   [address], [place], [place] [address] Debra Allen, NP, Veronia Beets. [facility]

## 2010-08-09 NOTE — Procedures (Signed)
[facility]  Patient:    [name], [name] Visit Number: [id number] MRN: [id number]          Service Type: MED Location: 2A A210 01 Attending Physician:  Bonne Dolores Dictated by:   Ermalinda Barrios, P.A.-C. Proc. Date: 04/07/01 Admit Date:  04/05/2001 Discharge Date: 04/08/2001                                Stress Test  DATE OF BIRTH:  [birth year]  PROCEDURE:  Stress test.  INDICATIONS:  This is a 60 year old admitted with chest pain, and the enzymes were negative.  DESCRIPTION OF PROCEDURE:  The normal baseline electrocardiogram revealed a normal sinus rhythm.  The patient exercised for eight minutes and 16 seconds of the Bruce protocol, attaining a heart rate of 145.  The target heart rate was 143.  The patient complained of chest pressure and left arm pain, and had 0.5 mm ST depression inferolaterally, which resolved in recovery.  Cardiolite images are to follow. Dictated by:   Ermalinda Barrios, P.A.-C. Attending Physician:  Bonne Dolores DD:  04/20/01 TD:  04/20/01 Job: [id number]

## 2010-08-09 NOTE — Procedures (Signed)
Michelle, [name]                 ACCOUNT NO.:  [id number]   MEDICAL RECORD NO.:  [id number]          PATIENT TYPE:  OBV   LOCATION:  A216                          FACILITY:  [facility]   PHYSICIAN:  Jacqulyn Ducking, M.D. [facility] OF BIRTH:  12-02-1950   DATE OF PROCEDURE:  DATE OF DISCHARGE:  06/30/2005                                  ECHOCARDIOGRAM   REFERRING PHYSICIAN:  Dr. Maryland Pink   CLINICAL DATA:  A 60 year old woman with chest pain, hypertension and  diabetes.   M-MODE:  Aorta 2.7, left atrium 3.8, septum 1.1, posterior wall 1.1, LV  diastole 4.8, LV systole 3.5.   IMPRESSION:  1.  Technically adequate echocardiographic study.  2.  Normal left and right atrial size.  Normal right ventricular size and      function; borderline right ventricular function.  3.  Normal trileaflet aortic valve; mild aortic root calcification.  4.  Slightly thickened mitral valve; minimal annular calcification.  5.  Normal pulmonic valve and proximal pulmonary artery.  6.  Normal tricuspid valve; physiologic regurgitation; estimated right      ventricular systolic pressure at the upper limit of normal to slightly      increased.  7.  Normal internal dimension, wall thickness, regional and global function      of the left ventricle.      Jacqulyn Ducking, M.D. [facility]  Electronically Signed     RR/MEDQ  D:  06/30/2005  T:  06/30/2005  Job:  [id number]

## 2010-08-09 NOTE — Discharge Summary (Signed)
[name], [name]                 ACCOUNT NO.:  [id number]   MEDICAL RECORD NO.:  [id number]          PATIENT TYPE:  OBV   LOCATION:  A216                          FACILITY:  [facility]   PHYSICIAN:  Bonnielee Haff, MD     DATE OF BIRTH:  [birth year]   DATE OF ADMISSION:  06/29/2005  DATE OF DISCHARGE:  04/09/2007LH                                 DISCHARGE SUMMARY   DISCHARGE DIAGNOSES:  1.  Chest pain, possibly esophageal spasm, possibly musculoskeletal.  2.  Type 2 diabetes, uncontrolled.  3.  Obesity.   Please see H&P dictated at the time of admission for details regarding  patient present illness.   HOSPITAL COURSE:  Briefly, this is a 60 year old Caucasian female who has  diabetes, obesity, who presented to the ED with left-sided sharp chest pain  which started at about 12:30 in the afternoon on April 8.  The patient gives  history of similar pain but of lower intensity on and off for the past 2  months.  The patient was given nitroglycerin with which she had resolution  of her symptoms.  Because of her risk factors such as diabetes and obesity,  the patient was admitted to the hospital, underwent a consultation by  cardiology.  Her EKG was unremarkable.  She did have a stress test 4 years  ago which showed some nonreversible defects.  She underwent another stress  test yesterday which has been reported to me as being okay with no  significant abnormalities.   The patient is currently pain free.  We are unable to explain her symptoms  at this time.  I obtained a D-dimer which was also within normal limits.  It  is possible that she has esophageal spasm which is causing her chest  discomfort and which is causing relief with nitroglycerin.  The patient  mentioned to me that she did have an upper endoscopy more than 10 years ago  which showed hiatal hernia and some inflammation of the lining of the  stomach.  It is quite possible that this may be the reason for her chest  symptoms.   I have asked her to make an appointment with Dr. Laural Golden to  possible undergo another endoscopy or to consider esophageal manometry.   In the meantime, the patient has been asked to continue with all risk factor  modifications such as weight loss, exercise, diet compliance.  She has also  been advised to continue with a baby aspirin every day.  She may continue  diabetic medications as before.  She has been asked to follow up with the  health department for blood pressure and blood sugar checks. Her blood  pressure was somewhat elevated in this admission; however, we did not start  her on any medication at this time.  She has been asked to follow up with  the health department for this issue.   FOLLOW UP:  1.  Dr. Laural Golden as discussed above.  2.  Health Department as discussed above.   DISCHARGE MEDICATIONS:  Baby aspirin 81 mg once a day.   No other  changes made.  Please see H&P.   OTHER INSTRUCTIONS:  The patient has been told that if she has recurrence of  chest symptoms which is associated with shortness of breath and not getting  relief, to seek attention immediately.      Bonnielee Haff, MD  Electronically Signed     GK/MEDQ  D:  06/30/2005  T:  06/30/2005  Job:  [id number]   cc:   Hildred Laser, M.D.  [address]  [place]   [id number]   Jacqulyn Ducking, M.D. [facility]  [address]  [place] [address]

## 2010-08-09 NOTE — Consult Note (Signed)
[name], [name] NO.:  [id number]   MEDICAL RECORD NO.:  [id number]          PATIENT TYPE:  OBV   LOCATION:  A216                          FACILITY:  [facility]   PHYSICIAN:  Jacqulyn Ducking, M.D. [facility] OF BIRTH:  17-Apr-1950   DATE OF CONSULTATION:  DATE OF DISCHARGE:                                   CONSULTATION   PRIMARY CARE PHYSICIAN:  Dionisio Paschal, NP, at the health department in  [facility].   HISTORY:  [name] is a 60 year old female with past medical history  significant for diabetes who presents with complaints of chest discomfort.  She reports a 3- to 20-month history of intermittent chest discomfort.  She  describes a chest tightness followed by left anterior stabbing discomfort  that occurs at rest and with exertion.  She has noted daily pain on  occasion.  She states that the chest pain lasts approximately 5-10 minutes  and then resolves on its own.  Yesterday while sitting on the couch she had  an episode of chest discomfort that lasted approximately 5-10 minutes and  then was self-limited.  She went about her regular daily activities.  She  was driving in her car later on during the day and noted return of the  discomfort in the left anterior chest.  This occasion, chest discomfort  lasted several hours.  She presented to the emergency department, was given  one sublingual nitroglycerin with initially worsening of the pain and then  resolution.  After being admitted to the hospital last night she had a  recurrent episode of chest discomfort that resolved after being given three  sublingual nitroglycerin.   PAST MEDICAL HISTORY:  [name] had a stress Myoview for chest pain in  January 2003 that revealed a nonreversible area of decreased perfusion in  the posterior septal wall with an EF of 54% and mild hypokinesia in  inferoseptal wall.  She has diabetes, unknown lipid status, questionable  history of hypertension.  She states she was  treated for this several years  ago; however, was unable to afford her medications and stopped them.   PAST SURGICAL HISTORY:  Status post partial hysterectomy, foot surgeries,  hand surgery, shoulder surgery, and carpal tunnel surgery.   SOCIAL HISTORY:  [name] lives in [place] with her fiance.  She cleans  offices for a living.  She has a daughter who is in the room with her at the  time of interview and exam.  She denies any tobacco abuse, alcohol use, or  illicit drug use.  She states her exercise she gets at work and working  outside in her garden.   FAMILY HISTORY:  Mother is deceased at 5 years old secondary to emphysema.  Father is deceased at 62 years old secondary to colon cancer and diabetes.  She has one brother deceased secondary to MVA and one brother and one sister  both living, none with coronary disease.   REVIEW OF SYSTEMS:  CONSTITUTIONAL:  No fevers or chills.  HEENT:  Headache  after nitroglycerin.  No sore throat or nasal congestion.  SKIN:  No rashes  or lesions.  CARDIOPULMONARY:  As per HPI.  No associated dyspnea.  Occasional palpitations.  No syncope or presyncope.  No coughing or  wheezing.  GU:  No frequency or dysuria.  NEUROPSYCH:  No weakness or  numbness.  MUSCULOSKELETAL:  Arthralgias in her hands and feet, no myalgias.  GI:  No nausea, vomiting, diarrhea, bright red blood per rectum or melena.  No abdominal pain.  All other systems reviewed are negative except as per  HPI.   ALLERGIES:  ACCUPRIL caused swelling and redness of her wrist.  She does  have a LATEX allergy.   MEDICATIONS PRIOR TO ADMISSION:  1.  Glipizide 10 mg daily and occasionally she will take this twice a day.  2.  Tylenol as needed.   MEDICATIONS IN THE HOSPITAL:  1.  Lovenox 40 mg daily.  2.  Sliding scale insulin.   PHYSICAL EXAMINATION:  VITAL SIGNS:  Temperature is 97.3, pulse 70,  respirations 20, blood pressure is 128/69.  Blood pressure was elevated in  the  emergency department at 186/81.  Weight is 212.7.  Oxygen saturation is  99% on 2 L.  GENERAL:  This is a well-developed, well-nourished female in no acute  distress.  HEENT:  Normocephalic, atraumatic.  Pupils equal, round, and reactive to  light.  Extraocular movements intact.  NECK:  Reveals no bruits or jugular venous distention, no lymphadenopathy is  noted.  CARDIOVASCULAR:  Regular rate and rhythm, no murmurs appreciated.  LUNGS:  Clear to auscultation bilaterally.  SKIN:  Warm, dry, no rashes.  BREASTS:  Clinical breast exam is deferred.  ABDOMEN:  Soft, nontender, with active bowel sounds.  GENITOURINARY AND RECTAL:  Deferred.  EXTREMITIES:  No clubbing, cyanosis, or edema and pulses intact bilaterally.  MUSCULOSKELETAL:  No joint deformity.  NEUROLOGIC:  She is alert and oriented x3 and cranial nerves are grossly  intact.   Chest x-ray:  No active disease.   Electrocardiogram:  Sinus rhythm at 64 beats per minute; normal axis;  nonspecific ST abnormalities; normal PR interval, QRS duration, and QTC.   LABORATORY DATA:  White blood cells 8.4, hemoglobin 13.9, hematocrit 41.2,  platelets 380.  Sodium 138, potassium 4.2 - up from 3.2 yesterday, chloride  104, bicarb 25, BUN 8, creatinine 0.7, glucose 173, total bilirubin 0.5, alk  phos 96, AST 21, ALT 26, total protein 6.3, albumin 3.3, calcium 9.1.  Cardiac enzymes negative x2.  Urinalysis positive for glucose and few  bacteria, otherwise within normal limits.   IMPRESSION AND PLAN:  1.  Chest discomfort, atypical in nature; relief with nitroglycerin.  Her      cardiac risk factors include diabetes, unknown lipid status, and a      questionable history of hypertension.  She did have a stress Cardiolite      in 2003 with the results as noted above.  Her EKG shows no acute      ischemic changes noted.  Will check a TSH level and obtain a stress      Myoview study today to rule out ischemia.  2.  Hypokalemia.  Replaced. 3.   Unknown lipid status.  Check fasting lipid profile.  4.  Questionable history of hypertension.  Continue to monitor blood      pressures during hospitalization.  5.  Diabetes.  Followed by the primary team.  6.  The patient does have difficulty affording her medications.  Will ask      the social worker  to come in and see if there is any assistance for Ms.      Hundal in this area.   We appreciate this consult and will be happy to follow this patient along  with you while she is in the hospital.      Cherre Blanc, P.A. [facility]      Jacqulyn Ducking, M.D. [facility]  Electronically Signed    AB/MEDQ  D:  06/30/2005  T:  06/30/2005  Job:  [id number]

## 2010-08-09 NOTE — H&P (Signed)
[facility]  Patient:    SIBLE, GATZ Visit Number: [id number] MRN: [id number]          Service Type: MED Location: 2A A210 01 Attending Physician:  Shaune Pollack Dictated by:   Bonne Dolores, M.D. Admit Date:  04/05/2001                           History and Physical  CHIEF COMPLAINT:  Chest pain.  HISTORY OF PRESENT ILLNESS:  This is a 60 year old female with a history of morbid obesity, hypertension and diabetes mellitus, type 2.  She was treated with medications until approximately one year ago when she reports her blood pressure normalized, as did her blood sugar, after an intentional 50-pound weight loss.  The patient presented to the emergency department for evaluation of a six-hour history of progressively worsening stabbing left anterior chest pain and left arm numbness which was partially pleuritic.  She also describes a heaviness in the substernal region.  He was given nitroglycerin x2 with prompt and complete resolution of her pain.  She denied any significant shortness of breath, diaphoresis, nausea, palpitations or syncope.  Her preliminary workup is benign.  She is admitted for rule out MI protocol, further evaluation and therapy as indicated.  There is no history of headache, neurologic deficits, abdominal pain, nausea, vomiting, diarrhea, symptoms compatible with GERD, melena, hematemesis or hematochezia or genitourinary symptoms.  She is a nonsmoker.  Lipid status is unknown.  CURRENT MEDICATIONS:  None.  PAST MEDICAL HISTORY:  As above.  PAST SURGICAL HISTORY:  She has a negative surgical history.  SOCIAL HISTORY:  As above.  Nonsmoker, nondrinker.  REVIEW OF SYSTEMS:  Negative except as mentioned.  ALLERGIES:  None known.  FAMILY HISTORY:  Mother died of colon cancer, father of complications of COPD. One brother died in a motorcycle accident.  No known coronary disease reported.  PHYSICAL EXAMINATION:  GENERAL:   Very pleasant female in no acute distress.  VITAL SIGNS:  On presentation, temperature 99.0, pulse 89, respirations 24, blood pressure 182/83, O2 saturation 98% on room air.  HEENT:  Normocephalic, atraumatic.  Pupils are equal.  Ears, nose and throat benign.  NECK:  Supple.  No bruits, thyromegaly or lymphadenopathy.  LUNGS:  Clear to A&P.  HEART:  Heart sounds are normal.  No murmurs, rubs, or gallops.  ABDOMEN:  Soft, nontender, nondistended.  Bowel sounds are intact.  EXTREMITIES:  No clubbing, cyanosis, or edema.  NEUROLOGIC:  Without focal deficits.  LABORATORY DATA:  EKG:  Normal sinus rhythm.  No significant axis abnormalities or significant ST-T changes.  CBC is normal as is her MET-7 except for a glucose of 155.  Cardiac enzymes negative.  ASSESSMENT:  Chest pain, somewhat atypical, but promptly responsive to nitroglycerin.  Rule out myocardial infarction protocol indicated.  PLAN:  Rule out MI protocol, aspirin and routine measures.  Will follow and treat expectantly and ask cardiology to assist in further evaluation. Dictated by:   Bonne Dolores, M.D. Attending Physician:  Shaune Pollack DD:  04/05/01 TD:  04/06/01 Job: [id number]

## 2011-03-20 ENCOUNTER — Encounter: Payer: Self-pay | Admitting: Cardiology

## 2011-12-29 LAB — BLOOD GAS, ARTERIAL
Acid-base deficit: 1.2 mmol/L (ref 0.0–2.0)
O2 Saturation: 60.7 %

## 2012-04-28 ENCOUNTER — Encounter: Payer: Self-pay | Admitting: *Deleted

## 2012-04-29 ENCOUNTER — Ambulatory Visit (INDEPENDENT_AMBULATORY_CARE_PROVIDER_SITE_OTHER): Payer: Medicaid Other | Admitting: Cardiology

## 2012-04-29 ENCOUNTER — Encounter: Payer: Self-pay | Admitting: Cardiology

## 2012-04-29 VITALS — BP 152/88 | HR 78 | Ht 67.0 in | Wt 222.0 lb

## 2012-04-29 DIAGNOSIS — E119 Type 2 diabetes mellitus without complications: Secondary | ICD-10-CM

## 2012-04-29 DIAGNOSIS — R079 Chest pain, unspecified: Secondary | ICD-10-CM

## 2012-04-29 DIAGNOSIS — M109 Gout, unspecified: Secondary | ICD-10-CM

## 2012-04-29 DIAGNOSIS — I10 Essential (primary) hypertension: Secondary | ICD-10-CM

## 2012-04-29 DIAGNOSIS — N184 Chronic kidney disease, stage 4 (severe): Secondary | ICD-10-CM | POA: Insufficient documentation

## 2012-04-29 DIAGNOSIS — G2581 Restless legs syndrome: Secondary | ICD-10-CM | POA: Insufficient documentation

## 2012-04-29 DIAGNOSIS — E785 Hyperlipidemia, unspecified: Secondary | ICD-10-CM

## 2012-04-29 DIAGNOSIS — D649 Anemia, unspecified: Secondary | ICD-10-CM

## 2012-04-29 DIAGNOSIS — E1140 Type 2 diabetes mellitus with diabetic neuropathy, unspecified: Secondary | ICD-10-CM | POA: Insufficient documentation

## 2012-04-29 MED ORDER — CARVEDILOL 12.5 MG PO TABS: 12.5000 mg | ORAL_TABLET | Freq: Two times a day (BID) | ORAL | Status: DC

## 2012-04-29 NOTE — Patient Instructions (Addendum)
Your physician recommends that you schedule a follow-up appointment in: [place] physician has recommended you make the following change in your medication:   1) INCREASE YOUR COREG TO 12.5MG  TWICE DAILY  YOUR PHYSICIAN RECOMMENDS THAT YOU RECORD YOUR BLOOD PRESSURE CHECKS AT HOME AND BRING THEM BACK IN WITH YOU TO YOUR NEXT OFFICE VISIT ON THE SHEETS PROVIDED WITH YOUR DISCHARGE SUMMARY TODAY

## 2012-04-29 NOTE — Assessment & Plan Note (Signed)
Adequate control of hyperlipidemia with good total and LDL cholesterol, but elevated triglycerides.

## 2012-04-29 NOTE — Progress Notes (Deleted)
Name: [name]    DOB: September 08, 1950  Age: 62 y.o.  MR#: [id number]       PCP:  Anthoney Harada, MD      Insurance: @PAYORNAME @   CC:   BOTTLES   VS BP 152/88  Pulse 78  Ht 5\' 7"  (1.702 m)  Wt 222 lb (100.699 kg)  BMI 34.77 kg/m2  Weights Current Weight  04/29/12 222 lb (100.699 kg)  02/20/10 219 lb (99.338 kg)  11/22/09 217 lb (98.431 kg)    Blood Pressure  BP Readings from Last 3 Encounters:  04/29/12 152/88  02/20/10 163/84  12/24/09 159/82     Admit date:  (Not on file) Last encounter with RMR:  Visit date not found   Allergy Allergies  Allergen Reactions  . Iodinated Diagnostic Agents   . Latex   . Quinapril Hcl   . Vancomycin Hcl     REACTION: facial swelling    Current Outpatient Prescriptions  Medication Sig Dispense Refill  . allopurinol (ZYLOPRIM) 100 MG tablet Take 100 mg by mouth daily.      Marland Kitchen amLODipine (NORVASC) 5 MG tablet Take 5 mg by mouth 2 (two) times daily.       . carvedilol (COREG) 6.25 MG tablet Take 6.25 mg by mouth 2 (two) times daily with a meal.      . Choline Fenofibrate (TRILIPIX) 135 MG capsule Take 135 mg by mouth daily.        . citalopram (CELEXA) 40 MG tablet Take 40 mg by mouth daily.      . diclofenac (VOLTAREN) 75 MG EC tablet Take 75 mg by mouth 2 (two) times daily.        . furosemide (LASIX) 20 MG tablet Take 20 mg by mouth 2 (two) times daily.      Marland Kitchen gabapentin (NEURONTIN) 800 MG tablet Take 800 mg by mouth 3 (three) times daily.        Marland Kitchen glipiZIDE (GLUCOTROL) 5 MG tablet Take 5 mg by mouth 2 (two) times daily before a meal.        . hydrochlorothiazide (MICROZIDE) 12.5 MG capsule Take 12.5 mg by mouth daily.      Marland Kitchen HYDROcodone-acetaminophen (LORTAB) 7.5-500 MG per tablet Take 1 tablet by mouth 3 (three) times daily as needed.        . insulin glargine (LANTUS) 100 UNIT/ML injection Inject 50 Units into the skin 2 (two) times daily.        Marland Kitchen levothyroxine (SYNTHROID, LEVOTHROID) 50 MCG tablet Take 50 mcg by mouth  daily.        Marland Kitchen LORazepam (ATIVAN) 1 MG tablet Take 1 mg by mouth 3 (three) times daily as needed.        Marland Kitchen omeprazole (PRILOSEC) 20 MG capsule Take 20 mg by mouth daily.      . potassium chloride (K-DUR) 10 MEQ tablet Take 10 mEq by mouth 2 (two) times daily.      . pravastatin (PRAVACHOL) 40 MG tablet Take 40 mg by mouth daily.        . ranitidine (ZANTAC) 150 MG capsule Take 150 mg by mouth 2 (two) times daily.        Marland Kitchen rOPINIRole (REQUIP) 1 MG tablet Take 1 mg by mouth at bedtime.      . Vitamin D, Ergocalciferol, (DRISDOL) 50000 UNITS CAPS Take 50,000 Units by mouth.      . zolpidem (AMBIEN) 10 MG tablet Take 10 mg by mouth every other day.  Discontinued Meds:    Medications Discontinued During This Encounter  Medication Reason  . potassium chloride (KLOR-CON) 20 MEQ packet Duplicate  . hydrochlorothiazide (HYDRODIURIL) 25 MG tablet Duplicate  . furosemide (LASIX) 40 MG tablet Duplicate  . citalopram (CELEXA) 20 MG tablet Duplicate  . allopurinol (ZYLOPRIM) [id number] MG tablet Duplicate  . lisinopril (PRINIVIL,ZESTRIL) 20 MG tablet Completed Course    Patient Active Problem List  Diagnosis  . HYPOTHYROIDISM  . HYPERLIPIDEMIA  . GOUT  . OBESITY  . ANEMIA  . HYPERTENSION  . GASTROESOPHAGEAL REFLUX DISEASE  . Chest pain  . RLS (restless legs syndrome)  . Chronic kidney disease    LABS No visits with results within 3 Month(s) from this visit. Latest known visit with results is:  Hospital Outpatient Visit on 03/03/2010  Component Date Value  . Sodium 03/03/2010 133*  . Potassium 03/03/2010 3.6   . Chloride 03/03/2010 97   . CO2 03/03/2010 25   . Glucose, Bld 03/03/2010 197*  . BUN 03/03/2010 45*  . Creatinine, Ser 03/03/2010 2.65*  . Calcium 03/03/2010 9.8   . GFR calc non Af Amer 03/03/2010 18*  . GFR calc Af Amer 03/03/2010 *                   Value:22                                The eGFR has been calculated                         using the MDRD  equation.                         This calculation has not been                         validated in all clinical                         situations.                         eGFR's persistently                         <60 mL/min signify                         possible Chronic Kidney Disease.  . WBC 03/03/2010 9.2   . RBC 03/03/2010 3.80*  . Hemoglobin 03/03/2010 10.9*  . HCT 03/03/2010 32.6*  . MCV 03/03/2010 85.8   . [facility] 03/03/2010 28.7   . MCHC 03/03/2010 33.4   . RDW 03/03/2010 13.2   . Platelets 03/03/2010 398   . Total CK 03/03/2010 255*  . CK, MB 03/03/2010 3.2   . Relative Index 03/03/2010 1.3   . Neutrophils Relative 03/03/2010 60   . Neutro Abs 03/03/2010 5.5   . Lymphocytes Relative 03/03/2010 20   . Lymphs Abs 03/03/2010 1.8   . Monocytes Relative 03/03/2010 12   . Monocytes Absolute 03/03/2010 1.1*  . Eosinophils Relative 03/03/2010 7*  . Eosinophils Absolute 03/03/2010 0.6   . Basophils Relative 03/03/2010 1   . Basophils Absolute 03/03/2010 0.1   . Troponin I 03/03/2010  Value:0.03                                NO INDICATION OF                         MYOCARDIAL INJURY.  Marland Kitchen FIO2 03/03/2010 .21   . Delivery systems 03/03/2010 ROOM AIR   . pH, Arterial 03/03/2010 7.398   . pCO2 arterial 03/03/2010 37.8   . pO2, Arterial 03/03/2010 74.9*  . Bicarbonate 03/03/2010 22.8   . TCO2 03/03/2010 20.8   . Acid-base deficit 03/03/2010 1.3   . O2 Saturation 03/03/2010 94.5   . Patient temperature 03/03/2010 37.0   . Collection site 03/03/2010 LEFT RADIAL   . Drawn by 03/03/2010 COLLECTED BY RT   . Sample type 03/03/2010 ARTERIAL   . Allens test (pass/fail) 03/03/2010 PASS   . Color, Urine 03/03/2010 YELLOW   . APPearance 03/03/2010 CLEAR   . Specific Gravity, Urine 03/03/2010 1.020   . pH 03/03/2010 5.0   . Glucose, UA 03/03/2010 >1000*  . Hgb urine dipstick 03/03/2010 SMALL*  . Bilirubin Urine 03/03/2010 NEGATIVE   . Ketones, ur 03/03/2010  NEGATIVE   . Protein, ur 03/03/2010 NEGATIVE   . Urobilinogen, UA 03/03/2010 0.2   . Nitrite 03/03/2010 NEGATIVE   . Leukocytes, UA 03/03/2010 NEGATIVE   . Squamous Epithelial / LPF 03/03/2010 FEW*  . WBC, UA 03/03/2010 7-10   . RBC / HPF 03/03/2010 7-10   . Bacteria, UA 03/03/2010 MANY*  . Specimen Description 03/03/2010 URINE, CLEAN CATCH   . Special Requests 03/03/2010 NONE   . Culture  Setup Time 03/03/2010 [id number]   . Colony Count 03/03/2010 20,OOO COLONIES/ML   . Culture 03/03/2010 Multiple bacterial morphotypes present, none predominant. Suggest appropriate recollection if clinically indicated.   . Report Status 03/03/2010 03/05/2010 FINAL      Results for this Opt Visit:     Results for orders placed during the hospital encounter of 0000000  BASIC METABOLIC PANEL      Component Value Range   Sodium 133 (*) 135 - 145 mEq/L   Potassium 3.6  3.5 - 5.1 mEq/L   Chloride 97  96 - 112 mEq/L   CO2 25  19 - 32 mEq/L   Glucose, Bld 197 (*) 70 - 99 mg/dL   BUN 45 (*) 6 - 23 mg/dL   Creatinine, Ser 2.65 (*) 0.4 - 1.2 mg/dL   Calcium 9.8  8.4 - 10.5 mg/dL   GFR calc non Af Amer 18 (*) >60 mL/min   GFR calc Af Amer   (*) >60 mL/min   Value: 22            The eGFR has been calculated     using the MDRD equation.     This calculation has not been     validated in all clinical     situations.     eGFR's persistently     <60 mL/min signify     possible Chronic Kidney Disease.  CBC      Component Value Range   WBC 9.2  4.0 - 10.5 K/uL   RBC 3.80 (*) 3.87 - 5.11 MIL/uL   Hemoglobin 10.9 (*) 12.0 - 15.0 g/dL   HCT 32.6 (*) 36.0 - 46.0 %   MCV 85.8  78.0 - 100.0 fL   MCH 28.7  26.0 - 34.0 pg   MCHC 33.4  30.0 - 36.0 g/dL   RDW 13.2  11.5 - 15.5 %   Platelets 398  150 - 400 K/uL  CK TOTAL AND CKMB      Component Value Range   Total CK 255 (*) 7 - 177 U/L   CK, MB 3.2  0.3 - 4.0 ng/mL   Relative Index 1.3  0.0 - 2.5  DIFFERENTIAL      Component Value Range    Neutrophils Relative 60  43 - 77 %   Neutro Abs 5.5  1.7 - 7.7 K/uL   Lymphocytes Relative 20  12 - 46 %   Lymphs Abs 1.8  0.7 - 4.0 K/uL   Monocytes Relative 12  3 - 12 %   Monocytes Absolute 1.1 (*) 0.1 - 1.0 K/uL   Eosinophils Relative 7 (*) 0 - 5 %   Eosinophils Absolute 0.6  0.0 - 0.7 K/uL   Basophils Relative 1  0 - 1 %   Basophils Absolute 0.1  0.0 - 0.1 K/uL  TROPONIN I      Component Value Range   Troponin I    0.00 - 0.06 ng/mL   Value: 0.03            NO INDICATION OF     MYOCARDIAL INJURY.  BLOOD GAS, ARTERIAL      Component Value Range   FIO2 .21     Delivery systems ROOM AIR     pH, Arterial 7.398  7.350 - 7.400   pCO2 arterial 37.8  35.0 - 45.0 mmHg   pO2, Arterial 74.9 (*) 80.0 - 100.0 mmHg   Bicarbonate 22.8  20.0 - 24.0 mEq/L   TCO2 20.8  0 - 100 mmol/L   Acid-base deficit 1.3  0.0 - 2.0 mmol/L   O2 Saturation 94.5     Patient temperature 37.0     Collection site LEFT RADIAL     Drawn by COLLECTED BY RT     Sample type ARTERIAL     Allens test (pass/fail) PASS  PASS  URINALYSIS, ROUTINE W REFLEX MICROSCOPIC      Component Value Range   Color, Urine YELLOW  YELLOW   APPearance CLEAR  CLEAR   Specific Gravity, Urine 1.020  1.005 - 1.030   pH 5.0  5.0 - 8.0   Glucose, UA >1000 (*) NEGATIVE mg/dL   Hgb urine dipstick SMALL (*) NEGATIVE   Bilirubin Urine NEGATIVE  NEGATIVE   Ketones, ur NEGATIVE  NEGATIVE mg/dL   Protein, ur NEGATIVE  NEGATIVE mg/dL   Urobilinogen, UA 0.2  0.0 - 1.0 mg/dL   Nitrite NEGATIVE  NEGATIVE   Leukocytes, UA NEGATIVE  NEGATIVE  URINE MICROSCOPIC-ADD ON      Component Value Range   Squamous Epithelial / LPF FEW (*) RARE   WBC, UA 7-10  <3 WBC/hpf   RBC / HPF 7-10  <3 RBC/hpf   Bacteria, UA MANY (*) RARE  URINE CULTURE      Component Value Range   Specimen Description URINE, CLEAN CATCH     Special Requests NONE     Culture  Setup Time [id number]     Colony Count 20,OOO COLONIES/ML     Culture       Value: Multiple  bacterial morphotypes present, none predominant. Suggest appropriate recollection if clinically indicated.   Report Status 03/05/2010 FINAL      EKG Orders placed in visit on 04/29/12  . EKG 12-LEAD     Prior Assessment and Plan Problem  List as of 04/29/2012          HYPOTHYROIDISM   HYPERLIPIDEMIA   GOUT   OBESITY   ANEMIA   HYPERTENSION   GASTROESOPHAGEAL REFLUX DISEASE   Chest pain   RLS (restless legs syndrome)   Chronic kidney disease       Imaging: No results found.   FRS Calculation: Score not calculated. Missing: Total Cholesterol

## 2012-04-29 NOTE — Assessment & Plan Note (Signed)
Renal function as measured by creatinine has improved substantially since 02/2010, but remains mildly to moderately impaired.

## 2012-04-29 NOTE — Progress Notes (Signed)
Patient ID: RIA KATE, female   DOB: 1950-07-16, 62 y.o.   MRN: [id number]  HPI: Ms. Louque is seen at the kind request of Dr. Jacelyn Grip after a 4 year absence from our practice. During most of this interval, she was residing in [place]. She was evaluated by a cardiologist there and underwent cardiac catheterization. Results of that assessment are unclear based upon her description, and records have been requested. Symptomatically, she is done well with no chest pain, chronic class II dyspnea on exertion and no hospitalizations nor need for urgent medical care. Diabetes has been difficult to control and required high-dose insulin therapy. A recent lipid profile by patient's PCP was at goal although triglycerides are high. Patient has never used tobacco products. She is uncertain how well blood pressure has been controlled.  Current Outpatient Prescriptions on File Prior to Visit  Medication Sig Dispense Refill  . allopurinol (ZYLOPRIM) 100 MG tablet Take 100 mg by mouth daily.      Marland Kitchen amLODipine (NORVASC) 5 MG tablet Take 5 mg by mouth 2 (two) times daily.       . carvedilol (COREG) 6.25 MG tablet Take 6.25 mg by mouth 2 (two) times daily with a meal.      . Choline Fenofibrate (TRILIPIX) 135 MG capsule Take 135 mg by mouth daily.        . citalopram (CELEXA) 40 MG tablet Take 40 mg by mouth daily.      . diclofenac (VOLTAREN) 75 MG EC tablet Take 75 mg by mouth 2 (two) times daily.        . furosemide (LASIX) 20 MG tablet Take 20 mg by mouth 2 (two) times daily.      Marland Kitchen gabapentin (NEURONTIN) 800 MG tablet Take 800 mg by mouth 3 (three) times daily.        Marland Kitchen glipiZIDE (GLUCOTROL) 5 MG tablet Take 5 mg by mouth 2 (two) times daily before a meal.        . hydrochlorothiazide (MICROZIDE) 12.5 MG capsule Take 12.5 mg by mouth daily.      Marland Kitchen HYDROcodone-acetaminophen (LORTAB) 7.5-500 MG per tablet Take 1 tablet by mouth 3 (three) times daily as needed.        . insulin glargine (LANTUS) 100 UNIT/ML injection  Inject 50 Units into the skin 2 (two) times daily.        Marland Kitchen levothyroxine (SYNTHROID, LEVOTHROID) 50 MCG tablet Take 50 mcg by mouth daily.        Marland Kitchen LORazepam (ATIVAN) 1 MG tablet Take 1 mg by mouth 3 (three) times daily as needed.        Marland Kitchen omeprazole (PRILOSEC) 20 MG capsule Take 20 mg by mouth daily.      . potassium chloride (K-DUR) 10 MEQ tablet Take 10 mEq by mouth 2 (two) times daily.      . pravastatin (PRAVACHOL) 40 MG tablet Take 40 mg by mouth daily.        . ranitidine (ZANTAC) 150 MG capsule Take 150 mg by mouth 2 (two) times daily.        Marland Kitchen rOPINIRole (REQUIP) 1 MG tablet Take 1 mg by mouth at bedtime.      Marland Kitchen zolpidem (AMBIEN) 10 MG tablet Take 10 mg by mouth every other day.         Allergies  Allergen Reactions  . Iodinated Diagnostic Agents   . Latex   . Quinapril Hcl   . Vancomycin Hcl     REACTION: facial  swelling     Past Medical History  Diagnosis Date  . Chest pain 2003    ?h/o MI, syncope; 2003 stress nuclear-inferoseptal scar, EF-54%; 2007-MI ruled out, echocardiogram-normal LV size, wall thickness and function  . RLS (restless legs syndrome)     sleep study in 2010: No apnea; moderate periodic movements  . GERD (gastroesophageal reflux disease)   . GI bleeding   . Obese   . Gout   . Hypothyroidism   . Insomnia   . Low back pain   . Cellulitis     Neck  . Diabetes mellitus 1998    A1c-7.1 in 2010  . Hyperlipidemia   . Hypertension 2002  . Chronic kidney disease     Creat-1.9 in 04/2008, 2.65 in 02/2010    Past Surgical History  Procedure Date  . Appendectomy   . Carpal tunnel release 1992, 1993, 2011    Right in 2011; previously bilateral  . Tubal ligation 1976  . Partial hysterectomy 1985  . Shoulder surgery 1997    Left  . Incise and drain abcess 2010    Neck-MRSA  . Colonoscopy 02/2002, 07/2009    few diverticula, otherwise negative  . Foot surgery 1984    , left    Family History  Problem Relation Age of Onset  . Emphysema Mother   .  Colon cancer Father   . Diabetes Father   . Congestive Heart Failure Maternal Grandmother     History   Social History  . Marital Status: Divorced    Spouse Name: N/A    Number of Children: 3  . Years of Education: N/A   Occupational History  . Disabled     previously Nurse, adult   Social History Main Topics  . Smoking status: Never Smoker   . Smokeless tobacco: Never Used  . Alcohol Use: No  . Drug Use: No  . Sexually Active: Not on file   Other Topics Concern  . Not on file   Social History Narrative   No regular exercise  ROS:  Patient reports some hearing impairment; palpitations; intermittent pedal edema; nocturnal diaphoresis; exertional dyspnea; arthritic discomfort; intermittent depression; restless leg syndrome; presence of a hernia.  All other systems reviewed and are negative.  PHYSICAL EXAM: BP 152/88  Pulse 78  Ht 5\' 7"  (1.702 m)  Wt 100.699 kg (222 lb)  BMI 34.77 kg/m2 ; repeat BP-138/75 General-Well-developed; no acute distress Body Habitus-moderately overweight HEENT-[place]/AT; PERRL; EOM intact; conjunctiva and lids nl Neck-No JVD; no carotid bruits Endocrine-mild thyromegaly Lungs-Clear lung fields; resonant percussion; normal I-to-E ratio Cardiovascular- normal PMI; normal S1 and S2 Abdomen-BS normal; soft and non-tender without masses or organomegaly Musculoskeletal-No deformities, cyanosis or clubbing Neurologic-Nl cranial nerves; symmetric strength and tone Skin- Warm, no significant lesions Extremities-Nl distal pulses; no edema  EKG:  Normal sinus rhythm; nonspecific T wave abnormality; right ventricular conduction delay; no previous tracing for comparison.   ASSESSMENT AND PLAN:  Jacqulyn Ducking, MD 04/29/2012 1:53 PM

## 2012-05-03 NOTE — Assessment & Plan Note (Signed)
Nature and extent of coronary artery disease is not entirely clear; prior records have been requested.

## 2012-05-03 NOTE — Assessment & Plan Note (Signed)
Blood pressure control is suboptimal.  Dose of carvedilol will be increased, and pressure closely monitored.

## 2012-05-04 ENCOUNTER — Encounter: Payer: Self-pay | Admitting: Cardiology

## 2012-05-21 ENCOUNTER — Emergency Department (HOSPITAL_COMMUNITY): Payer: Medicare PPO

## 2012-05-21 ENCOUNTER — Encounter (HOSPITAL_COMMUNITY): Payer: Self-pay | Admitting: *Deleted

## 2012-05-21 ENCOUNTER — Inpatient Hospital Stay (HOSPITAL_COMMUNITY)
Admission: EM | Admit: 2012-05-21 | Discharge: 2012-05-23 | DRG: 638 | Disposition: A | Discharge status: Home / Self Care | Payer: Medicare PPO | Attending: Internal Medicine | Admitting: Internal Medicine

## 2012-05-21 DIAGNOSIS — E1140 Type 2 diabetes mellitus with diabetic neuropathy, unspecified: Secondary | ICD-10-CM | POA: Diagnosis present

## 2012-05-21 DIAGNOSIS — Z794 Long term (current) use of insulin: Secondary | ICD-10-CM

## 2012-05-21 DIAGNOSIS — E871 Hypo-osmolality and hyponatremia: Secondary | ICD-10-CM | POA: Diagnosis present

## 2012-05-21 DIAGNOSIS — N184 Chronic kidney disease, stage 4 (severe): Secondary | ICD-10-CM | POA: Diagnosis present

## 2012-05-21 DIAGNOSIS — R631 Polydipsia: Secondary | ICD-10-CM | POA: Diagnosis present

## 2012-05-21 DIAGNOSIS — D649 Anemia, unspecified: Secondary | ICD-10-CM | POA: Diagnosis present

## 2012-05-21 DIAGNOSIS — N183 Chronic kidney disease, stage 3 unspecified: Secondary | ICD-10-CM | POA: Diagnosis present

## 2012-05-21 DIAGNOSIS — Z6834 Body mass index (BMI) 34.0-34.9, adult: Secondary | ICD-10-CM

## 2012-05-21 DIAGNOSIS — R3589 Other polyuria: Secondary | ICD-10-CM | POA: Diagnosis present

## 2012-05-21 DIAGNOSIS — K219 Gastro-esophageal reflux disease without esophagitis: Secondary | ICD-10-CM | POA: Diagnosis present

## 2012-05-21 DIAGNOSIS — E039 Hypothyroidism, unspecified: Secondary | ICD-10-CM | POA: Diagnosis present

## 2012-05-21 DIAGNOSIS — E669 Obesity, unspecified: Secondary | ICD-10-CM | POA: Diagnosis present

## 2012-05-21 DIAGNOSIS — IMO0001 Reserved for inherently not codable concepts without codable children: Principal | ICD-10-CM | POA: Diagnosis present

## 2012-05-21 DIAGNOSIS — IMO0002 Reserved for concepts with insufficient information to code with codable children: Secondary | ICD-10-CM | POA: Diagnosis present

## 2012-05-21 DIAGNOSIS — Z79899 Other long term (current) drug therapy: Secondary | ICD-10-CM

## 2012-05-21 DIAGNOSIS — I129 Hypertensive chronic kidney disease with stage 1 through stage 4 chronic kidney disease, or unspecified chronic kidney disease: Secondary | ICD-10-CM | POA: Diagnosis present

## 2012-05-21 DIAGNOSIS — E785 Hyperlipidemia, unspecified: Secondary | ICD-10-CM | POA: Diagnosis present

## 2012-05-21 DIAGNOSIS — G2581 Restless legs syndrome: Secondary | ICD-10-CM | POA: Diagnosis present

## 2012-05-21 DIAGNOSIS — I10 Essential (primary) hypertension: Secondary | ICD-10-CM | POA: Diagnosis present

## 2012-05-21 DIAGNOSIS — N179 Acute kidney failure, unspecified: Secondary | ICD-10-CM | POA: Diagnosis present

## 2012-05-21 DIAGNOSIS — M109 Gout, unspecified: Secondary | ICD-10-CM | POA: Diagnosis present

## 2012-05-21 DIAGNOSIS — R079 Chest pain, unspecified: Secondary | ICD-10-CM | POA: Diagnosis present

## 2012-05-21 DIAGNOSIS — E119 Type 2 diabetes mellitus without complications: Secondary | ICD-10-CM

## 2012-05-21 LAB — CBC WITH DIFFERENTIAL/PLATELET
Basophils Absolute: 0.1 10*3/uL (ref 0.0–0.1)
HCT: 37.3 % (ref 36.0–46.0)
Lymphocytes Relative: 25 % (ref 12–46)
Lymphs Abs: 2.6 10*3/uL (ref 0.7–4.0)
Neutro Abs: 6.4 10*3/uL (ref 1.7–7.7)
Platelets: 393 10*3/uL (ref 150–400)
RBC: 4.53 MIL/uL (ref 3.87–5.11)
RDW: 14.1 % (ref 11.5–15.5)
WBC: 10.3 10*3/uL (ref 4.0–10.5)

## 2012-05-21 LAB — GLUCOSE, CAPILLARY
Glucose-Capillary: 577 mg/dL (ref 70–99)
Glucose-Capillary: 537 mg/dL — ABNORMAL HIGH (ref 70–99)
Glucose-Capillary: 443 mg/dL — ABNORMAL HIGH (ref 70–99)

## 2012-05-21 LAB — TROPONIN I
Troponin I: <0.3 ng/mL (ref <0.30)
Troponin I: <0.3 ng/mL (ref <0.30)
Troponin I: <0.3 ng/mL (ref <0.30)

## 2012-05-21 LAB — BASIC METABOLIC PANEL
CO2: 31 mEq/L (ref 19–32)
Chloride: 85 mEq/L — ABNORMAL LOW (ref 96–112)
Glucose, Bld: 633 mg/dL (ref 70–99)
Sodium: 127 mEq/L — ABNORMAL LOW (ref 135–145)

## 2012-05-21 MED ORDER — CARVEDILOL 12.5 MG PO TABS
12.5000 mg | ORAL_TABLET | Freq: Two times a day (BID) | ORAL | Status: DC
  Administered 2012-05-21 – 2012-05-23 (×4): 12.5 mg via ORAL
  Filled 2012-05-21 (×4): qty 1

## 2012-05-21 MED ORDER — PANTOPRAZOLE SODIUM 40 MG PO TBEC
40.0000 mg | DELAYED_RELEASE_TABLET | Freq: Every day | ORAL | Status: DC
  Administered 2012-05-22 – 2012-05-23 (×2): 40 mg via ORAL
  Filled 2012-05-21 (×2): qty 1

## 2012-05-21 MED ORDER — SODIUM CHLORIDE 0.9 % IJ SOLN
3.0000 mL | Freq: Two times a day (BID) | INTRAMUSCULAR | Status: DC
  Administered 2012-05-21 – 2012-05-23 (×3): 3 mL via INTRAVENOUS

## 2012-05-21 MED ORDER — HEPARIN SODIUM (PORCINE) 5000 UNIT/ML IJ SOLN: 5000.0000 [IU] | Freq: Three times a day (TID) | INTRAMUSCULAR | Status: DC

## 2012-05-21 MED ORDER — ONDANSETRON HCL 4 MG/2ML IJ SOLN: 4.0000 mg | Freq: Four times a day (QID) | INTRAMUSCULAR | Status: DC | PRN

## 2012-05-21 MED ORDER — HYDROMORPHONE HCL PF 1 MG/ML IJ SOLN
0.5000 mg | Freq: Once | INTRAMUSCULAR | Status: AC
  Administered 2012-05-21: 0.5 mg via INTRAVENOUS
  Filled 2012-05-21: qty 1

## 2012-05-21 MED ORDER — ACETAMINOPHEN 325 MG PO TABS
650.0000 mg | ORAL_TABLET | Freq: Four times a day (QID) | ORAL | Status: DC | PRN
  Administered 2012-05-22: 650 mg via ORAL
  Filled 2012-05-21: qty 2

## 2012-05-21 MED ORDER — PANTOPRAZOLE SODIUM 40 MG PO TBEC: 40.0000 mg | DELAYED_RELEASE_TABLET | Freq: Every day | ORAL | Status: DC

## 2012-05-21 MED ORDER — ROPINIROLE HCL 1 MG PO TABS
1.0000 mg | ORAL_TABLET | Freq: Every day | ORAL | Status: DC
  Administered 2012-05-21: 1 mg via ORAL
  Filled 2012-05-21 (×3): qty 1

## 2012-05-21 MED ORDER — ROPINIROLE HCL 1 MG PO TABS
ORAL_TABLET | ORAL | Status: AC
  Filled 2012-05-21: qty 1

## 2012-05-21 MED ORDER — SIMVASTATIN 20 MG PO TABS
20.0000 mg | ORAL_TABLET | Freq: Every day | ORAL | Status: DC
  Administered 2012-05-22: 20 mg via ORAL
  Filled 2012-05-21: qty 1

## 2012-05-21 MED ORDER — LEVOTHYROXINE SODIUM 25 MCG PO TABS
50.0000 ug | ORAL_TABLET | Freq: Every day | ORAL | Status: DC
  Administered 2012-05-22 – 2012-05-23 (×2): 50 ug via ORAL
  Filled 2012-05-21: qty 1
  Filled 2012-05-21: qty 2
  Filled 2012-05-21 (×2): qty 1
  Filled 2012-05-21: qty 2

## 2012-05-21 MED ORDER — ALLOPURINOL 100 MG PO TABS
100.0000 mg | ORAL_TABLET | Freq: Every morning | ORAL | Status: DC
  Administered 2012-05-22 – 2012-05-23 (×2): 100 mg via ORAL
  Filled 2012-05-21 (×2): qty 1

## 2012-05-21 MED ORDER — GABAPENTIN 300 MG PO CAPS
2400.0000 mg | ORAL_CAPSULE | Freq: Every day | ORAL | Status: DC
  Administered 2012-05-21 – 2012-05-22 (×2): 2400 mg via ORAL
  Filled 2012-05-21: qty 2
  Filled 2012-05-21: qty 6
  Filled 2012-05-21: qty 8

## 2012-05-21 MED ORDER — AMLODIPINE BESYLATE 5 MG PO TABS
5.0000 mg | ORAL_TABLET | Freq: Two times a day (BID) | ORAL | Status: DC
  Administered 2012-05-21 – 2012-05-23 (×4): 5 mg via ORAL
  Filled 2012-05-21 (×4): qty 1

## 2012-05-21 MED ORDER — CITALOPRAM HYDROBROMIDE 20 MG PO TABS
ORAL_TABLET | ORAL | Status: AC
  Filled 2012-05-21: qty 2

## 2012-05-21 MED ORDER — HEPARIN SODIUM (PORCINE) 5000 UNIT/ML IJ SOLN
5000.0000 [IU] | Freq: Three times a day (TID) | INTRAMUSCULAR | Status: DC
  Administered 2012-05-21 – 2012-05-23 (×5): 5000 [IU] via SUBCUTANEOUS
  Filled 2012-05-21 (×5): qty 1

## 2012-05-21 MED ORDER — FENOFIBRATE 160 MG PO TABS
160.0000 mg | ORAL_TABLET | Freq: Every day | ORAL | Status: DC
  Administered 2012-05-22 – 2012-05-23 (×2): 160 mg via ORAL
  Filled 2012-05-21 (×3): qty 1

## 2012-05-21 MED ORDER — CITALOPRAM HYDROBROMIDE 20 MG PO TABS
40.0000 mg | ORAL_TABLET | Freq: Every day | ORAL | Status: DC
  Administered 2012-05-21 – 2012-05-22 (×2): 40 mg via ORAL
  Filled 2012-05-21 (×3): qty 1
  Filled 2012-05-21: qty 2

## 2012-05-21 MED ORDER — ONDANSETRON HCL 4 MG PO TABS: 4.0000 mg | ORAL_TABLET | Freq: Four times a day (QID) | ORAL | Status: DC | PRN

## 2012-05-21 MED ORDER — SODIUM CHLORIDE 0.9 % IV SOLN
INTRAVENOUS | Status: DC
  Administered 2012-05-21 – 2012-05-23 (×5): via INTRAVENOUS

## 2012-05-21 MED ORDER — SODIUM CHLORIDE 0.9 % IV SOLN
Freq: Once | INTRAVENOUS | Status: AC
  Administered 2012-05-21: 16:00:00 via INTRAVENOUS

## 2012-05-21 MED ORDER — KETOROLAC TROMETHAMINE 30 MG/ML IJ SOLN
30.0000 mg | Freq: Once | INTRAMUSCULAR | Status: AC
  Administered 2012-05-21: 30 mg via INTRAVENOUS
  Filled 2012-05-21: qty 1

## 2012-05-21 MED ORDER — SODIUM CHLORIDE 0.9 % IV SOLN
INTRAVENOUS | Status: AC
  Administered 2012-05-21: 3.2 [IU]/h via INTRAVENOUS
  Administered 2012-05-21: 9.8 [IU]/h via INTRAVENOUS
  Administered 2012-05-21: 7.7 [IU]/h via INTRAVENOUS
  Administered 2012-05-21: 4.8 [IU]/h via INTRAVENOUS
  Filled 2012-05-21: qty 1

## 2012-05-21 NOTE — ED Provider Notes (Signed)
History  This chart was scribed for Nat Christen, MD by Roe Coombs, ED Scribe. The patient was seen in room APA14/APA14. Patient's care was started at 1442.  CSN: [id number]  Arrival date & time 05/21/12  1431   First MD Initiated Contact with Patient 05/21/12 1442      Chief Complaint  Patient presents with  . Hyperglycemia     The history is provided by the patient. No language interpreter was used.    HPI Comments: [name] is a 62 y.o. female who presents to the Emergency Department complaining of hyperglycemia for the past few days. She also reports episodic, central, pressure-like chest pain for the past few days. Patient says that episodes usually last for up to 10 minutes and then pain is resolved. She denies any specific relieving factors. Patient says that she is currently having mild chest discomfort now. She states that she decided to come to the ED today because she was having lightheadedness, generalized weakness and just wasn't "feeling good." She also reports left shoulder and arm pain. Her medical history includes diabetes. Patient takes insulin shots daily, but does not take any pills. She states that her CBG at home prior to arrival was 575 and it has been high for awhile. Patient denies shortness of breath, diaphoresis, nausea or vomiting. Other medical history includes chronic kidney disease, atherosclerosis, heart attack, GERD, upper GI bleed, gout and hypertension.   Past Medical History  Diagnosis Date  . Chest pain 2003    ?h/o MI, syncope; 2003 stress nuclear-inferoseptal scar, EF-54%; 2007-MI ruled out, echocardiogram-normal LV size, wall thickness and function  . RLS (restless legs syndrome)     sleep study in 2010: No apnea; moderate periodic movements  . GERD (gastroesophageal reflux disease)     H/o upper GI bleed  . GI bleeding   . Obese   . Gout   . Hypothyroidism   . Depression     Insomnia  . Low back pain   . Cellulitis     Neck  .  Diabetes mellitus 1998    A1c-7.1 in 2010, 6.7 in 2013  . Hyperlipidemia     Fasting lipids 2013:158, 152, 35, 93  . Hypertension 2002  . Chronic kidney disease, stage III (moderate)     Creat-1.9 in 04/2008, 2.65 in 02/2010    Past Surgical History  Procedure Laterality Date  . Appendectomy    . Carpal tunnel release  1992, 1993, 2011    Right in 2011; previously bilateral  . Tubal ligation  1976  . Partial hysterectomy  1985    Vaginal  . Shoulder surgery  1997    Left  . Incise and drain abcess  2010    Neck-MRSA  . Colonoscopy  02/2002, 07/2009    few diverticula, otherwise negative  . Foot surgery  1984    Bilateral heel spurs  . Abdominal hysterectomy      Family History  Problem Relation Age of Onset  . Emphysema Mother   . Colon cancer Father   . Diabetes Father   . Congestive Heart Failure Maternal Grandmother     History  Substance Use Topics  . Smoking status: Never Smoker   . Smokeless tobacco: Never Used  . Alcohol Use: No    OB History   Grav Para Term Preterm Abortions TAB SAB Ect Mult Living                  Review of Systems A  complete 10 system review of systems was obtained and all systems are negative except as noted in the HPI and PMH.   Allergies  Iodinated diagnostic agents; Latex; Quinapril hcl; and Vancomycin hcl  Home Medications   Current Outpatient Rx  Name  Route  Sig  Dispense  Refill  . allopurinol (ZYLOPRIM) 100 MG tablet   Oral   Take 100 mg by mouth daily.         Marland Kitchen amLODipine (NORVASC) 5 MG tablet   Oral   Take 5 mg by mouth 2 (two) times daily.          . carvedilol (COREG) 12.5 MG tablet   Oral   Take 1 tablet (12.5 mg total) by mouth 2 (two) times daily.   180 tablet   3   . Choline Fenofibrate (TRILIPIX) 135 MG capsule   Oral   Take 135 mg by mouth daily.           . citalopram (CELEXA) 40 MG tablet   Oral   Take 40 mg by mouth daily.         . diclofenac (VOLTAREN) 75 MG EC tablet   Oral    Take 75 mg by mouth 2 (two) times daily.           . furosemide (LASIX) 20 MG tablet   Oral   Take 20 mg by mouth 2 (two) times daily.         Marland Kitchen gabapentin (NEURONTIN) 800 MG tablet   Oral   Take 800 mg by mouth 3 (three) times daily.           Marland Kitchen glipiZIDE (GLUCOTROL) 5 MG tablet   Oral   Take 5 mg by mouth 2 (two) times daily before a meal.           . hydrochlorothiazide (MICROZIDE) 12.5 MG capsule   Oral   Take 12.5 mg by mouth daily.         Marland Kitchen HYDROcodone-acetaminophen (LORTAB) 7.5-500 MG per tablet   Oral   Take 1 tablet by mouth 3 (three) times daily as needed.           . insulin glargine (LANTUS) 100 UNIT/ML injection   Subcutaneous   Inject 50 Units into the skin 2 (two) times daily.           Marland Kitchen levothyroxine (SYNTHROID, LEVOTHROID) 50 MCG tablet   Oral   Take 50 mcg by mouth daily.           Marland Kitchen LORazepam (ATIVAN) 1 MG tablet   Oral   Take 1 mg by mouth 3 (three) times daily as needed.           Marland Kitchen omeprazole (PRILOSEC) 20 MG capsule   Oral   Take 20 mg by mouth daily.         . potassium chloride (K-DUR) 10 MEQ tablet   Oral   Take 10 mEq by mouth 2 (two) times daily.         . pravastatin (PRAVACHOL) 40 MG tablet   Oral   Take 40 mg by mouth daily.           . ranitidine (ZANTAC) 150 MG capsule   Oral   Take 150 mg by mouth 2 (two) times daily.           Marland Kitchen rOPINIRole (REQUIP) 1 MG tablet   Oral   Take 1 mg by mouth at bedtime.         Marland Kitchen  Vitamin D, Ergocalciferol, (DRISDOL) 50000 UNITS CAPS   Oral   Take 50,000 Units by mouth.         . zolpidem (AMBIEN) 10 MG tablet   Oral   Take 10 mg by mouth every other day.             Triage Vitals: BP 137/71  Pulse 69  Temp(Src) 98.1 F (36.7 C) (Oral)  Resp 20  Ht 5\' 7"  (1.702 m)  Wt 222 lb (100.699 kg)  BMI 34.76 kg/m2  SpO2 97%  Physical Exam  Nursing note and vitals reviewed. Constitutional: She is oriented to person, place, and time. She appears  well-developed and well-nourished.  Obese.  HENT:  Head: Normocephalic and atraumatic.  Eyes: Conjunctivae and EOM are normal. Pupils are equal, round, and reactive to light.  Neck: Normal range of motion. Neck supple.  Cardiovascular: Normal rate, regular rhythm and normal heart sounds.   Pulmonary/Chest: Effort normal and breath sounds normal.  Abdominal: Soft. Bowel sounds are normal.  Musculoskeletal: Normal range of motion.  Neurological: She is alert and oriented to person, place, and time.  Skin: Skin is warm and dry.  Psychiatric: She has a normal mood and affect.    ED Course  Procedures (including critical care time) DIAGNOSTIC STUDIES: Oxygen Saturation is 97% on room air, adequate by my interpretation.    COORDINATION OF CARE: 3:08 PM- Patient informed of current plan for treatment and evaluation and agrees with plan at this time.      Labs Reviewed - No data to display No results found. Results for orders placed during the hospital encounter of 05/21/12  GLUCOSE, CAPILLARY      Result Value Range   Glucose-Capillary 577 (*) 70 - 99 mg/dL   Comment 1 Notify RN     Comment 2 Documented in Chart    CBC WITH DIFFERENTIAL      Result Value Range   WBC 10.3  4.0 - 10.5 K/uL   RBC 4.53  3.87 - 5.11 MIL/uL   Hemoglobin 12.4  12.0 - 15.0 g/dL   HCT 37.3  36.0 - 46.0 %   MCV 82.3  78.0 - 100.0 fL   MCH 27.4  26.0 - 34.0 pg   MCHC 33.2  30.0 - 36.0 g/dL   RDW 14.1  11.5 - 15.5 %   Platelets 393  150 - 400 K/uL   Neutrophils Relative 62  43 - 77 %   Neutro Abs 6.4  1.7 - 7.7 K/uL   Lymphocytes Relative 25  12 - 46 %   Lymphs Abs 2.6  0.7 - 4.0 K/uL   Monocytes Relative 9  3 - 12 %   Monocytes Absolute 0.9  0.1 - 1.0 K/uL   Eosinophils Relative 3  0 - 5 %   Eosinophils Absolute 0.3  0.0 - 0.7 K/uL   Basophils Relative 1  0 - 1 %   Basophils Absolute 0.1  0.0 - 0.1 K/uL  BASIC METABOLIC PANEL      Result Value Range   Sodium 127 (*) 135 - 145 mEq/L   Potassium  3.4 (*) 3.5 - 5.1 mEq/L   Chloride 85 (*) 96 - 112 mEq/L   CO2 31  19 - 32 mEq/L   Glucose, Bld 633 (*) 70 - 99 mg/dL   BUN 32 (*) 6 - 23 mg/dL   Creatinine, Ser 1.80 (*) 0.50 - 1.10 mg/dL   Calcium 9.5  8.4 - 10.5 mg/dL  GFR calc non Af Amer 29 (*) >90 mL/min   GFR calc Af Amer 34 (*) >90 mL/min  TROPONIN I      Result Value Range   Troponin I <0.30  <0.30 ng/mL  GLUCOSE, CAPILLARY      Result Value Range   Glucose-Capillary 537 (*) 70 - 99 mg/dL   Comment 1 Documented in Chart     Comment 2 Notify RN      No diagnosis found.   Date: 05/21/2012  Rate: 66  Rhythm: normal sinus rhythm  QRS Axis: normal  Intervals: normal  ST/T Wave abnormalities: normal  Conduction Disutrbances: none  Narrative Interpretation: unremarkable c PAC  CRITICAL CARE Performed by: Nat Christen   Total critical care time: 30  Critical care time was exclusive of separately billable procedures and treating other patients.  Critical care was necessary to treat or prevent imminent or life-threatening deterioration.  Critical care was time spent personally by me on the following activities: development of treatment plan with patient and/or surrogate as well as nursing, discussions with consultants, evaluation of patient's response to treatment, examination of patient, obtaining history from patient or surrogate, ordering and performing treatments and interventions, ordering and review of laboratory studies, ordering and review of radiographic studies, pulse oximetry and re-evaluation of patient's condition.  MDM  Patient has a host of risk factors for acute coronary syndromes including hypertension, diabetes, hypercholesterolemia. History is strong for cardiac etiology..  Glucose greater than 500. CO2 normal.      I personally performed the services described in this documentation, which was scribed in my presence. The recorded information has been reviewed and is accurate.    Nat Christen,  MD 05/21/12 1714

## 2012-05-21 NOTE — Plan of Care (Signed)
Problem: Consults Goal: Diabetes Mellitus Patient Education See Patient Education Module for education specifics.  Outcome: Progressing ordered Goal: Diagnosis-Diabetes Mellitus Outcome: Completed/Met Date Met:  05/21/12 Hyperglycemia Goal: Nutrition Consult-if indicated Outcome: Progressing ordered  Problem: Phase I Progression Outcomes Goal: NPO or per MD order Outcome: Not Applicable Date Met:  99991111 Pt has carb modified diet ordered Goal: Pain controlled with appropriate interventions Outcome: Progressing Pt had no prn pain meds ordered when I did her assessment and had h/a 7/10.  Was given IV Toradol and Dilaudid in the ED.  Paged MD to see if we could get PRN orders for one of those.  Got a one time order for 0.5 mg IV Dilaudid and then PRN Tylenol.  Pt's h/a only decreased from a 7 to a 4.   Goal: Diabetes Coordinator Consult Outcome: Progressing ordered

## 2012-05-21 NOTE — Progress Notes (Addendum)
Diabetic Education handouts printed from [facility] consult given to pt for her reading.  Pt acknowledged education and stated she would read them later as she was tired now.  Placed educational handouts in her Patient Admission packet.  Pt and family state that her previous MD in [place] prior to her moving here stated that her blood sugar issues were something that there was nothing left for him to do, that she would no longer respond to treatment and her blood sugars would always be this high and out of control.  Again, this is per pt and family report.  Gave education anyway.  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

## 2012-05-21 NOTE — H&P (Signed)
[facility] History and Physical  [name] [id number] DOB: 11/02/1950 DOA: 05/21/2012  Referring physician: Dr. Lacinda Axon, [place] physician. PCP: Anthoney Harada, MD    Chief Complaint: Elevated blood glucose, chest pain/pressure.  HPI: [name] is a 62 y.o. female who presents to the hospital with complaint of elevated blood glucose the last week or so and the chest pain/pressure for the last 3 days. She describes the chest symptoms that can occur at rest and usually last 5-10 minutes. They can occur 2-3 times a day. Sometimes they're associated with radiation of the pain down the left arm. It is never associated with nausea, sweating or dyspnea. She apparently does have a history of coronary artery disease in her investigations were in [place], old notes and are available. She has seen Dr. Lattie Haw, cardiology recently in the outpatient setting. She is also diabetic and from what she is telling me her sugar has been uncontrolled for several weeks now. She does describe polydipsia and polyuria. She feels weak. She has a history of chronic kidney disease, had previously seen Dr Hinda Lenis, nephrology.  Review of Systems:   Apart from history of present illness, other systems negative.  Past Medical History  Diagnosis Date  . Chest pain 2003    ?h/o MI, syncope; 2003 stress nuclear-inferoseptal scar, EF-54%; 2007-MI ruled out, echocardiogram-normal LV size, wall thickness and function  . RLS (restless legs syndrome)     sleep study in 2010: No apnea; moderate periodic movements  . GERD (gastroesophageal reflux disease)     H/o upper GI bleed  . GI bleeding   . Obese   . Gout   . Hypothyroidism   . Depression     Insomnia  . Low back pain   . Cellulitis     Neck  . Diabetes mellitus 1998    A1c-7.1 in 2010, 6.7 in 2013  . Hyperlipidemia     Fasting lipids 2013:158, 152, 35, 93  . Hypertension 2002  . Chronic kidney disease, stage III (moderate)     Creat-1.9 in  04/2008, 2.65 in 02/2010   Past Surgical History  Procedure Laterality Date  . Appendectomy    . Carpal tunnel release  1992, 1993, 2011    Right in 2011; previously bilateral  . Tubal ligation  1976  . Partial hysterectomy  1985    Vaginal  . Shoulder surgery  1997    Left  . Incise and drain abcess  2010    Neck-MRSA  . Colonoscopy  02/2002, 07/2009    few diverticula, otherwise negative  . Foot surgery  1984    Bilateral heel spurs  . Abdominal hysterectomy     Social History:  She is divorced, lives with her son. She does not smoke. She does not drink alcohol.  Allergies  Allergen Reactions  . Latex Shortness Of Breath and Swelling    Facial swelling  . Iodinated Diagnostic Agents     Reaction is unknown  . Vancomycin Hcl     REACTION: facial swelling  . Quinapril Hcl Swelling and Rash    Family History  Problem Relation Age of Onset  . Emphysema Mother   . Colon cancer Father   . Diabetes Father   . Congestive Heart Failure Maternal Grandmother       Prior to Admission medications   Medication Sig Start Date End Date Taking? Authorizing Provider  allopurinol (ZYLOPRIM) 100 MG tablet Take 100 mg by mouth every morning.    Yes Historical  Provider, MD  amLODipine (NORVASC) 5 MG tablet Take 5 mg by mouth 2 (two) times daily.    Yes Historical Provider, MD  carvedilol (COREG) 12.5 MG tablet Take 1 tablet (12.5 mg total) by mouth 2 (two) times daily. 04/29/12  Yes Yehuda Savannah, MD  Choline Fenofibrate (TRILIPIX) 135 MG capsule Take 135 mg by mouth every morning.    Yes Historical Provider, MD  citalopram (CELEXA) 40 MG tablet Take 40 mg by mouth at bedtime.    Yes Historical Provider, MD  furosemide (LASIX) 20 MG tablet Take 20 mg by mouth every morning.    Yes Historical Provider, MD  gabapentin (NEURONTIN) 800 MG tablet Take 2,400 mg by mouth at bedtime.    Yes Historical Provider, MD  hydrochlorothiazide (MICROZIDE) 12.5 MG capsule Take 12.5 mg by mouth every  morning.    Yes Historical Provider, MD  insulin aspart (NOVOLOG FLEXPEN) 100 UNIT/ML injection Inject 40 Units into the skin 2 (two) times daily.   Yes Historical Provider, MD  insulin glargine (LANTUS) 100 UNIT/ML injection Inject 41-43 Units into the skin 2 (two) times daily. Injects 41 units in the morning and 43 units at bedtime   Yes Historical Provider, MD  levothyroxine (SYNTHROID, LEVOTHROID) 50 MCG tablet Take 50 mcg by mouth every morning.    Yes Historical Provider, MD  omeprazole (PRILOSEC) 20 MG capsule Take 20 mg by mouth every morning.    Yes Historical Provider, MD  potassium chloride (K-DUR) 10 MEQ tablet Take 10 mEq by mouth every morning.    Yes Historical Provider, MD  pravastatin (PRAVACHOL) 40 MG tablet Take 40 mg by mouth at bedtime.    Yes Historical Provider, MD  rOPINIRole (REQUIP) 1 MG tablet Take 1 mg by mouth at bedtime.   Yes Historical Provider, MD   Physical Exam: Filed Vitals:   05/21/12 1447 05/21/12 1626  BP: 137/71 118/82  Pulse: 69 61  Temp: 98.1 F (36.7 C)   TempSrc: Oral   Resp: 20 20  Height: 5\' 7"  (1.702 m)   Weight: 100.699 kg (222 lb)   SpO2: 97% 95%     General:  She looks clinically dehydrated. She is not toxic or septic.  Eyes: No pallor. No jaundice.  ENT: No abnormalities.  Neck: No lymphadenopathy. No thyromegaly.  Cardiovascular: Heart sounds are present and in sinus rhythm. There is no murmur or gallop rhythm.  Respiratory: Lung fields are clear.  Abdomen: Soft, nontender. No masses.  Skin: No rash. There are several tattoos, located in the upper back, left leg and fingers.  Musculoskeletal: No major abnormalities.  Psychiatric: Appropriate affect.  Neurologic: Alert and orientated. No focal neurological signs.  Labs on Admission:  Basic Metabolic Panel:  Recent Labs Lab 05/21/12 1517  NA 127*  K 3.4*  CL 85*  CO2 31  GLUCOSE 633*  BUN 32*  CREATININE 1.80*  CALCIUM 9.5       CBC:  Recent Labs Lab  05/21/12 1517  WBC 10.3  NEUTROABS 6.4  HGB 12.4  HCT 37.3  MCV 82.3  PLT 393   Cardiac Enzymes:  Recent Labs Lab 05/21/12 1517  TROPONINI <0.30      CBG:  Recent Labs Lab 05/21/12 1459 05/21/12 1623  GLUCAP 577* 537*    Radiological Exams on Admission: Dg Chest Port 1 View  05/21/2012  *RADIOLOGY REPORT*  Clinical Data: Chest pain  PORTABLE CHEST - 1 VIEW  Comparison: 12/27/2009  Findings: Lungs are clear.  No pleural effusion  or pneumothorax.  The heart is top normal in size/mildly enlarged.  IMPRESSION: No evidence of acute cardiopulmonary disease.   Original Report Authenticated By: Julian Hy, M.D.     EKG: Independently reviewed. Normal sinus rhythm, no acute ST-T wave changes.  Assessment/Plan   1. Uncontrolled type 2 diabetes mellitus. No evidence of DKA. 2. Chest pain/pressure, unclear etiology although she does have clear risk factors for coronary artery disease. 3. Hypertension. 4. Obesity. 5. Hypothyroidism. 6. Chronic kidney disease, baseline is creatinine of around 2. She is currently clinically dehydrated. 7. Hyponatremia secondary to #1 and dehydration.  Plan: 1. Admit to step down unit. 2. Glucose stabilizer. 3. Intravenous fluids. 4. Serial troponin levels. Consider cardiology consultation depending on troponin levels. 5. Nutrition consult and diabetic teaching.   Code Status: Full code.   Family Communication: Discussed plan with patient at the bedside.   Disposition Plan: Home when medically stable.   Time spent: 45 minutes.  Doree Albee [facility] Pager [phone].  If 7PM-7AM, please contact night-coverage [email or web address] Password [facility] 05/21/2012, 5:09 PM

## 2012-05-21 NOTE — ED Notes (Signed)
Patient c/o headache returning, rates pain at a 7 out of 10. Patient states "I don't typically get headaches." EDP made aware of pain and patient's statement.

## 2012-05-21 NOTE — ED Notes (Signed)
Pt says she feels weak,no NVD,  Says cbg 575. At home.  Pt says pain lt side of neck and lt arm for 2-3 days,  Chest pain for "a few days"

## 2012-05-22 DIAGNOSIS — E669 Obesity, unspecified: Secondary | ICD-10-CM

## 2012-05-22 DIAGNOSIS — N183 Chronic kidney disease, stage 3 unspecified: Secondary | ICD-10-CM

## 2012-05-22 LAB — COMPREHENSIVE METABOLIC PANEL
ALT: 13 U/L (ref 0–35)
Calcium: 8.7 mg/dL (ref 8.4–10.5)
Creatinine, Ser: 1.58 mg/dL — ABNORMAL HIGH (ref 0.50–1.10)
GFR calc Af Amer: 39 mL/min — ABNORMAL LOW (ref >90)
Glucose, Bld: 182 mg/dL — ABNORMAL HIGH (ref 70–99)
Sodium: 137 mEq/L (ref 135–145)
Total Protein: 7.2 g/dL (ref 6.0–8.3)

## 2012-05-22 LAB — GLUCOSE, CAPILLARY
Glucose-Capillary: 129 mg/dL — ABNORMAL HIGH (ref 70–99)
Glucose-Capillary: 179 mg/dL — ABNORMAL HIGH (ref 70–99)
Glucose-Capillary: 209 mg/dL — ABNORMAL HIGH (ref 70–99)
Glucose-Capillary: 211 mg/dL — ABNORMAL HIGH (ref 70–99)
Glucose-Capillary: 117 mg/dL — ABNORMAL HIGH (ref 70–99)
Glucose-Capillary: 353 mg/dL — ABNORMAL HIGH (ref 70–99)

## 2012-05-22 LAB — GLUCOSE, RANDOM: Glucose, Bld: 410 mg/dL — ABNORMAL HIGH (ref 70–99)

## 2012-05-22 LAB — CBC
HCT: 35.5 % — ABNORMAL LOW (ref 36.0–46.0)
Hemoglobin: 12 g/dL (ref 12.0–15.0)
MCHC: 33.8 g/dL (ref 30.0–36.0)
RBC: 4.3 MIL/uL (ref 3.87–5.11)
WBC: 8.8 10*3/uL (ref 4.0–10.5)

## 2012-05-22 LAB — TROPONIN I: Troponin I: <0.3 ng/mL (ref <0.30)

## 2012-05-22 MED ORDER — POTASSIUM CHLORIDE CRYS ER 20 MEQ PO TBCR
40.0000 meq | EXTENDED_RELEASE_TABLET | Freq: Once | ORAL | Status: AC
  Administered 2012-05-22: 40 meq via ORAL
  Filled 2012-05-22: qty 2

## 2012-05-22 MED ORDER — INSULIN GLARGINE 100 UNIT/ML ~~LOC~~ SOLN
40.0000 [IU] | Freq: Every day | SUBCUTANEOUS | Status: DC
  Administered 2012-05-22: 10:00:00 via SUBCUTANEOUS
  Administered 2012-05-23: 40 [IU] via SUBCUTANEOUS

## 2012-05-22 MED ORDER — INSULIN ASPART 100 UNIT/ML ~~LOC~~ SOLN
15.0000 [IU] | Freq: Once | SUBCUTANEOUS | Status: AC
  Administered 2012-05-22: 15 [IU] via SUBCUTANEOUS

## 2012-05-22 MED ORDER — INSULIN GLARGINE 100 UNIT/ML ~~LOC~~ SOLN
20.0000 [IU] | Freq: Every day | SUBCUTANEOUS | Status: DC
  Administered 2012-05-22: 20 [IU] via SUBCUTANEOUS

## 2012-05-22 MED ORDER — INSULIN ASPART 100 UNIT/ML ~~LOC~~ SOLN
0.0000 [IU] | Freq: Every day | SUBCUTANEOUS | Status: DC
  Administered 2012-05-22: 5 [IU] via SUBCUTANEOUS

## 2012-05-22 MED ORDER — INSULIN ASPART 100 UNIT/ML ~~LOC~~ SOLN
0.0000 [IU] | Freq: Three times a day (TID) | SUBCUTANEOUS | Status: DC
  Administered 2012-05-22: 5 [IU] via SUBCUTANEOUS
  Administered 2012-05-22: 15 [IU] via SUBCUTANEOUS
  Administered 2012-05-23: 11 [IU] via SUBCUTANEOUS
  Administered 2012-05-23: 5 [IU] via SUBCUTANEOUS

## 2012-05-22 MED ORDER — INSULIN GLARGINE 100 UNIT/ML ~~LOC~~ SOLN
25.0000 [IU] | Freq: Every day | SUBCUTANEOUS | Status: DC
  Administered 2012-05-22: 25 [IU] via SUBCUTANEOUS

## 2012-05-22 MED ORDER — ROPINIROLE HCL 1 MG PO TABS
1.0000 mg | ORAL_TABLET | Freq: Three times a day (TID) | ORAL | Status: DC
  Administered 2012-05-22 – 2012-05-23 (×3): 1 mg via ORAL
  Filled 2012-05-22 (×6): qty 1

## 2012-05-22 NOTE — Progress Notes (Signed)
     Subjective: This lady has improved. Her blood glucoses under much better control, she is off the glucose stabilizer. She has had still some chest pain during hospitalization but her serial troponin levels are negative. She denies any nausea vomiting, she is able to tolerate a diet.           Physical Exam: Blood pressure 112/57, pulse 60, temperature 97.6 F (36.4 C), temperature source Oral, resp. rate 16, height 5\' 7"  (1.702 m), weight 100.699 kg (222 lb), SpO2 95.00%. She looks systemically well and much better hydrated. Heart sounds are present and normal without gallop rhythm or murmurs. Lung fields are clear. He is alert and orientated.   Investigations:  Recent Results (from the past 240 hour(s))  MRSA PCR SCREENING     Status: None   Collection Time    05/21/12  6:35 PM      Result Value Range Status   MRSA by PCR NEGATIVE  NEGATIVE Final   Comment:            The GeneXpert MRSA Assay (FDA     approved for NASAL specimens     only), is one component of a     comprehensive MRSA colonization     surveillance program. It is not     intended to diagnose MRSA     infection nor to guide or     monitor treatment for     MRSA infections.     Basic Metabolic Panel:  Recent Labs  05/21/12 1517 05/22/12 0510  NA 127* 137  K 3.4* 3.2*  CL 85* 99  CO2 31 28  GLUCOSE 633* 182*  BUN 32* 26*  CREATININE 1.80* 1.58*  CALCIUM 9.5 8.7   Liver Function Tests:  Recent Labs  05/22/12 0510  AST 13  ALT 13  ALKPHOS 102  BILITOT 0.2*  PROT 7.2  ALBUMIN 3.0*     CBC:  Recent Labs  05/21/12 1517 05/22/12 0510  WBC 10.3 8.8  NEUTROABS 6.4  --   HGB 12.4 12.0  HCT 37.3 35.5*  MCV 82.3 82.6  PLT 393 367    Dg Chest Port 1 View  05/21/2012  *RADIOLOGY REPORT*  Clinical Data: Chest pain  PORTABLE CHEST - 1 VIEW  Comparison: 12/27/2009  Findings: Lungs are clear.  No pleural effusion or pneumothorax.  The heart is top normal in size/mildly enlarged.   IMPRESSION: No evidence of acute cardiopulmonary disease.   Original Report Authenticated By: Julian Hy, M.D.       Medications: I have reviewed the patient's current medications.  Impression: 1. Uncontrolled type 2 diabetes mellitus, improving. 2. Chest pain/pressure, no evidence of myocardial ischemia or infarction. 3. Hypertension. 4. Hypothyroidism. 5. Obesity. 6. Acute on chronic kidney disease, improving.     Plan: 1. Reduce IV fluids. 2. Increase Lantus insulin to 40 units a day. She apparently takes double this dose at home but I think we will try to see what her requirements are together with the appropriate carbohydrate modified diet. 3. Mobilize. 4. Patient can move to the regular medical floor. 5. If she stable, consider discharge home tomorrow. She will need close follow with cardiology, she, she has an appointment with Dr. Lattie Haw on this coming Monday.     LOS: 1 day   Doree Albee Pager [phone]  05/22/2012, 7:35 AM

## 2012-05-22 NOTE — Plan of Care (Signed)
Problem: Phase I Progression Outcomes Goal: OOB as tolerated unless otherwise ordered Outcome: Progressing 05/22/12 1321 Patient up to chair this morning, instructed to call for assist as needed.

## 2012-05-22 NOTE — Progress Notes (Signed)
  RD consulted for nutrition education regarding diabetes.   Lab Results  Component Value Date   HGBA1C 7.1 01/22/2009    RD provided DM edu. Discussed different food groups and their effects on blood sugar, emphasizing carbohydrate-containing foods.   Discussed importance of controlled and consistent carbohydrate intake throughout the day. Provided examples of ways to balance meals/snacks.  Expect fair compliance.  Body mass index is 34.76 kg/(m^2). Pt meets criteria for  Obesity Class II based on current BMI.  Current diet order is  CHO Modified (1600-2000 kcal), patient is consuming approximately 75% of meals at this time. Labs and medications reviewed. No further nutrition interventions warranted at this time. RD contact information provided. If additional nutrition issues arise, please re-consult RD.  Colman Cater MS,RD,LDN,CSG Office: [phone] Pager: [phone]

## 2012-05-22 NOTE — Progress Notes (Signed)
05/22/12 1551 Patient c/o leg discomfort this afternoon, stated " I need something for my restless legs". Stated usually takes requip three times daily, until new insurance switched to only once per day. Notified Dr Anastasio Champion, order placed for requip three times daily. Donavan Foil, RN

## 2012-05-22 NOTE — Progress Notes (Signed)
Report given to Donavan Foil, Rn on 300 unit.  She verbalized understanding.  Pt is ready for transfer.

## 2012-05-23 DIAGNOSIS — E1142 Type 2 diabetes mellitus with diabetic polyneuropathy: Secondary | ICD-10-CM

## 2012-05-23 DIAGNOSIS — E1149 Type 2 diabetes mellitus with other diabetic neurological complication: Secondary | ICD-10-CM

## 2012-05-23 LAB — BASIC METABOLIC PANEL
CO2: 27 mEq/L (ref 19–32)
Chloride: 102 mEq/L (ref 96–112)
GFR calc Af Amer: 41 mL/min — ABNORMAL LOW (ref >90)
Sodium: 137 mEq/L (ref 135–145)

## 2012-05-23 LAB — GLUCOSE, CAPILLARY
Glucose-Capillary: 201 mg/dL — ABNORMAL HIGH (ref 70–99)
Glucose-Capillary: 317 mg/dL — ABNORMAL HIGH (ref 70–99)

## 2012-05-23 MED ORDER — ASPIRIN 81 MG PO TABS: 81.0000 mg | ORAL_TABLET | Freq: Every day | ORAL | Status: DC

## 2012-05-23 MED ORDER — POTASSIUM CHLORIDE ER 10 MEQ PO TBCR: 10.0000 meq | EXTENDED_RELEASE_TABLET | Freq: Every day | ORAL | Status: DC | PRN

## 2012-05-23 MED ORDER — FUROSEMIDE 20 MG PO TABS: 20.0000 mg | ORAL_TABLET | Freq: Every day | ORAL | Status: DC | PRN

## 2012-05-23 MED ORDER — NITROGLYCERIN 0.4 MG SL SUBL: 0.4000 mg | SUBLINGUAL_TABLET | SUBLINGUAL | Status: DC | PRN

## 2012-05-23 MED ORDER — INSULIN GLARGINE 100 UNIT/ML ~~LOC~~ SOLN: 50.0000 [IU] | Freq: Two times a day (BID) | SUBCUTANEOUS | Status: DC

## 2012-05-23 NOTE — Progress Notes (Signed)
05/23/12 1233 Patient discharged home. Reviewed discharge instructions with patient, given copy of instructions, medication list, prescriptions, f/u appointment information. Stated has appointment scheduled with PCP already. Given education notes for diabetes standards of care, diabetes basics, and chest pain. Verbalized understanding of importance of monitoring blood sugars and when to call physician. Stated has working CBG meter at home, instructed to record CBGs for f/u visit as ordered. IV site d/c'd and within normal limits. Pt left floor in stable condition via w/c accompanied by nurse tech. Donavan Foil, RN

## 2012-05-23 NOTE — Progress Notes (Signed)
05/23/12 1239 Late entry for 1130. Patient concerned about some swelling to hands this morning on assessment. Notified Dr Conley Canal on rounds, possibly d/t receiving IV fluids. Discussed with patient, stated okay. Donavan Foil, RN

## 2012-05-23 NOTE — Discharge Summary (Signed)
Physician Discharge Summary  Patient ID: [name] MRN: [id number] DOB/AGE: 63-Jan-1952 63 y.o.  Admit date: 05/21/2012 Discharge date: 05/23/2012  Discharge Diagnoses:  Chest pain   Diabetes mellitus, type II, uncontrolled   HYPOTHYROIDISM   OBESITY   Anemia, normocytic normochromic   HYPERTENSION   Chronic kidney disease, stage III (moderate)    Medication List    STOP taking these medications       hydrochlorothiazide 12.5 MG capsule  Commonly known as:  MICROZIDE      TAKE these medications       allopurinol 100 MG tablet  Commonly known as:  ZYLOPRIM  Take 100 mg by mouth every morning.     amLODipine 5 MG tablet  Commonly known as:  NORVASC  Take 5 mg by mouth 2 (two) times daily.     aspirin 81 MG tablet  Take 1 tablet (81 mg total) by mouth daily.     carvedilol 12.5 MG tablet  Commonly known as:  COREG  Take 1 tablet (12.5 mg total) by mouth 2 (two) times daily.     citalopram 40 MG tablet  Commonly known as:  CELEXA  Take 40 mg by mouth at bedtime.     furosemide 20 MG tablet  Commonly known as:  LASIX  Take 1 tablet (20 mg total) by mouth daily as needed (swelling).     gabapentin 800 MG tablet  Commonly known as:  NEURONTIN  Take 2,400 mg by mouth at bedtime.     insulin glargine 100 UNIT/ML injection  Commonly known as:  LANTUS  Inject 50 Units into the skin 2 (two) times daily. Injects 41 units in the morning and 43 units at bedtime     levothyroxine 50 MCG tablet  Commonly known as:  SYNTHROID, LEVOTHROID  Take 50 mcg by mouth every morning.     nitroGLYCERIN 0.4 MG SL tablet  Commonly known as:  NITROSTAT  Place 1 tablet (0.4 mg total) under the tongue every 5 (five) minutes as needed for chest pain.     NOVOLOG FLEXPEN 100 UNIT/ML injection  Generic drug:  insulin aspart  Inject 40 Units into the skin 2 (two) times daily.     omeprazole 20 MG capsule  Commonly known as:  PRILOSEC  Take 20 mg by mouth every morning.      potassium chloride 10 MEQ tablet  Commonly known as:  K-DUR  Take 1 tablet (10 mEq total) by mouth daily as needed (when you take furosemide).     pravastatin 40 MG tablet  Commonly known as:  PRAVACHOL  Take 40 mg by mouth at bedtime.     rOPINIRole 1 MG tablet  Commonly known as:  REQUIP  Take 1 mg by mouth at bedtime.     TRILIPIX 135 MG capsule  Generic drug:  Choline Fenofibrate  Take 135 mg by mouth every morning.            Discharge Orders   Future Appointments Provider Department Dept Phone   05/24/2012 3:00 PM Yehuda Savannah, MD  [facility]   Future Orders Complete By Expires     Activity as tolerated - No restrictions  As directed     Diet - low sodium heart healthy  As directed     Diet Carb Modified  As directed     Discharge instructions  As directed     Comments:      Monitor blood glucose, record and bring  log book with you to follow up appointment.       Follow-up Information   Follow up with Anthoney Harada, MD.   Contact information:   [place] [address] [phone]       Follow up with Jacqulyn Ducking, MD.   Contact information:   [address] [place] [address] [phone]      Disposition:  home  Discharged Condition: Stable  Consults:   none  Labs:   Results for orders placed during the hospital encounter of 05/21/12 (from the past 48 hour(s))  GLUCOSE, CAPILLARY     Status: Abnormal   Collection Time    05/21/12  2:59 PM      Result Value Range   Glucose-Capillary 577 (*) 70 - 99 mg/dL   Comment 1 Notify RN     Comment 2 Documented in Chart    CBC WITH DIFFERENTIAL     Status: None   Collection Time    05/21/12  3:17 PM      Result Value Range   WBC 10.3  4.0 - 10.5 K/uL   RBC 4.53  3.87 - 5.11 MIL/uL   Hemoglobin 12.4  12.0 - 15.0 g/dL   HCT 37.3  36.0 - 46.0 %   MCV 82.3  78.0 - 100.0 fL   MCH 27.4  26.0 - 34.0 pg   MCHC  33.2  30.0 - 36.0 g/dL   RDW 14.1  11.5 - 15.5 %   Platelets 393  150 - 400 K/uL   Neutrophils Relative 62  43 - 77 %   Neutro Abs 6.4  1.7 - 7.7 K/uL   Lymphocytes Relative 25  12 - 46 %   Lymphs Abs 2.6  0.7 - 4.0 K/uL   Monocytes Relative 9  3 - 12 %   Monocytes Absolute 0.9  0.1 - 1.0 K/uL   Eosinophils Relative 3  0 - 5 %   Eosinophils Absolute 0.3  0.0 - 0.7 K/uL   Basophils Relative 1  0 - 1 %   Basophils Absolute 0.1  0.0 - 0.1 K/uL  BASIC METABOLIC PANEL     Status: Abnormal   Collection Time    05/21/12  3:17 PM      Result Value Range   Sodium 127 (*) 135 - 145 mEq/L   Potassium 3.4 (*) 3.5 - 5.1 mEq/L   Chloride 85 (*) 96 - 112 mEq/L   CO2 31  19 - 32 mEq/L   Glucose, Bld 633 (*) 70 - 99 mg/dL   Comment: CRITICAL RESULT CALLED TO, READ BACK BY AND VERIFIED WITH:     KEITH,J ON 05/21/12 AT 1555 BY LOY,C   BUN 32 (*) 6 - 23 mg/dL   Creatinine, Ser 1.80 (*) 0.50 - 1.10 mg/dL   Calcium 9.5  8.4 - 10.5 mg/dL   GFR calc non Af Amer 29 (*) >90 mL/min   GFR calc Af Amer 34 (*) >90 mL/min   Comment:            The eGFR has been calculated     using the CKD EPI equation.     This calculation has not been     validated in all clinical     situations.     eGFR's persistently     <90 mL/min signify     possible Chronic Kidney Disease.  TROPONIN I     Status: None  Collection Time    05/21/12  3:17 PM      Result Value Range   Troponin I <0.30  <0.30 ng/mL   Comment:            Due to the release kinetics of cTnI,     a negative result within the first hours     of the onset of symptoms does not rule out     myocardial infarction with certainty.     If myocardial infarction is still suspected,     repeat the test at appropriate intervals.  GLUCOSE, CAPILLARY     Status: Abnormal   Collection Time    05/21/12  4:23 PM      Result Value Range   Glucose-Capillary 537 (*) 70 - 99 mg/dL   Comment 1 Documented in Chart     Comment 2 Notify RN    GLUCOSE, CAPILLARY      Status: Abnormal   Collection Time    05/21/12  5:15 PM      Result Value Range   Glucose-Capillary 522 (*) 70 - 99 mg/dL   Comment 1 Notify RN    TROPONIN I     Status: None   Collection Time    05/21/12  5:27 PM      Result Value Range   Troponin I <0.30  <0.30 ng/mL   Comment:            Due to the release kinetics of cTnI,     a negative result within the first hours     of the onset of symptoms does not rule out     myocardial infarction with certainty.     If myocardial infarction is still suspected,     repeat the test at appropriate intervals.  GLUCOSE, CAPILLARY     Status: Abnormal   Collection Time    05/21/12  6:21 PM      Result Value Range   Glucose-Capillary 443 (*) 70 - 99 mg/dL   Comment 1 Notify RN    MRSA PCR SCREENING     Status: None   Collection Time    05/21/12  6:35 PM      Result Value Range   MRSA by PCR NEGATIVE  NEGATIVE   Comment:            The GeneXpert MRSA Assay (FDA     approved for NASAL specimens     only), is one component of a     comprehensive MRSA colonization     surveillance program. It is not     intended to diagnose MRSA     infection nor to guide or     monitor treatment for     MRSA infections.  GLUCOSE, CAPILLARY     Status: Abnormal   Collection Time    05/21/12  7:51 PM      Result Value Range   Glucose-Capillary 361 (*) 70 - 99 mg/dL  GLUCOSE, CAPILLARY     Status: Abnormal   Collection Time    05/21/12  8:51 PM      Result Value Range   Glucose-Capillary 251 (*) 70 - 99 mg/dL  GLUCOSE, CAPILLARY     Status: Abnormal   Collection Time    05/21/12  9:44 PM      Result Value Range   Glucose-Capillary 211 (*) 70 - 99 mg/dL  GLUCOSE, CAPILLARY     Status: Abnormal   Collection Time    05/21/12  10:48 PM      Result Value Range   Glucose-Capillary 209 (*) 70 - 99 mg/dL  TROPONIN I     Status: None   Collection Time    05/21/12 10:50 PM      Result Value Range   Troponin I <0.30  <0.30 ng/mL   Comment:             Due to the release kinetics of cTnI,     a negative result within the first hours     of the onset of symptoms does not rule out     myocardial infarction with certainty.     If myocardial infarction is still suspected,     repeat the test at appropriate intervals.  GLUCOSE, CAPILLARY     Status: Abnormal   Collection Time    05/21/12 11:47 PM      Result Value Range   Glucose-Capillary 179 (*) 70 - 99 mg/dL  GLUCOSE, CAPILLARY     Status: Abnormal   Collection Time    05/22/12  1:06 AM      Result Value Range   Glucose-Capillary 129 (*) 70 - 99 mg/dL  GLUCOSE, CAPILLARY     Status: Abnormal   Collection Time    05/22/12  1:48 AM      Result Value Range   Glucose-Capillary 117 (*) 70 - 99 mg/dL  GLUCOSE, CAPILLARY     Status: Abnormal   Collection Time    05/22/12  2:46 AM      Result Value Range   Glucose-Capillary 121 (*) 70 - 99 mg/dL  COMPREHENSIVE METABOLIC PANEL     Status: Abnormal   Collection Time    05/22/12  5:10 AM      Result Value Range   Sodium 137  135 - 145 mEq/L   Comment: DELTA CHECK NOTED   Potassium 3.2 (*) 3.5 - 5.1 mEq/L   Chloride 99  96 - 112 mEq/L   Comment: DELTA CHECK NOTED   CO2 28  19 - 32 mEq/L   Glucose, Bld 182 (*) 70 - 99 mg/dL   BUN 26 (*) 6 - 23 mg/dL   Creatinine, Ser 1.58 (*) 0.50 - 1.10 mg/dL   Calcium 8.7  8.4 - 10.5 mg/dL   Total Protein 7.2  6.0 - 8.3 g/dL   Albumin 3.0 (*) 3.5 - 5.2 g/dL   AST 13  0 - 37 U/L   ALT 13  0 - 35 U/L   Alkaline Phosphatase 102  39 - 117 U/L   Total Bilirubin 0.2 (*) 0.3 - 1.2 mg/dL   GFR calc non Af Amer 34 (*) >90 mL/min   GFR calc Af Amer 39 (*) >90 mL/min   Comment:            The eGFR has been calculated     using the CKD EPI equation.     This calculation has not been     validated in all clinical     situations.     eGFR's persistently     <90 mL/min signify     possible Chronic Kidney Disease.  CBC     Status: Abnormal   Collection Time    05/22/12  5:10 AM      Result Value  Range   WBC 8.8  4.0 - 10.5 K/uL   RBC 4.30  3.87 - 5.11 MIL/uL   Hemoglobin 12.0  12.0 - 15.0 g/dL   HCT 35.5 (*) 36.0 -  46.0 %   MCV 82.6  78.0 - 100.0 fL   MCH 27.9  26.0 - 34.0 pg   MCHC 33.8  30.0 - 36.0 g/dL   RDW 14.4  11.5 - 15.5 %   Platelets 367  150 - 400 K/uL  TROPONIN I     Status: None   Collection Time    05/22/12  5:10 AM      Result Value Range   Troponin I <0.30  <0.30 ng/mL   Comment:            Due to the release kinetics of cTnI,     a negative result within the first hours     of the onset of symptoms does not rule out     myocardial infarction with certainty.     If myocardial infarction is still suspected,     repeat the test at appropriate intervals.  GLUCOSE, CAPILLARY     Status: Abnormal   Collection Time    05/22/12  7:14 AM      Result Value Range   Glucose-Capillary 222 (*) 70 - 99 mg/dL   Comment 1 Documented in Chart     Comment 2 Notify RN    GLUCOSE, CAPILLARY     Status: Abnormal   Collection Time    05/22/12 11:13 AM      Result Value Range   Glucose-Capillary 382 (*) 70 - 99 mg/dL   Comment 1 Notify RN     Comment 2 Documented in Chart    GLUCOSE, CAPILLARY     Status: Abnormal   Collection Time    05/22/12 12:55 PM      Result Value Range   Glucose-Capillary 370 (*) 70 - 99 mg/dL   Comment 1 Notify RN     Comment 2 Documented in Chart    GLUCOSE, CAPILLARY     Status: Abnormal   Collection Time    05/22/12  4:49 PM      Result Value Range   Glucose-Capillary 401 (*) 70 - 99 mg/dL   Comment 1 Notify RN     Comment 2 Documented in Chart    GLUCOSE, RANDOM     Status: Abnormal   Collection Time    05/22/12  4:56 PM      Result Value Range   Glucose, Bld 410 (*) 70 - 99 mg/dL  GLUCOSE, CAPILLARY     Status: Abnormal   Collection Time    05/22/12  8:38 PM      Result Value Range   Glucose-Capillary 353 (*) 70 - 99 mg/dL  BASIC METABOLIC PANEL     Status: Abnormal   Collection Time    05/23/12  6:08 AM      Result Value  Range   Sodium 137  135 - 145 mEq/L   Potassium 3.3 (*) 3.5 - 5.1 mEq/L   Chloride 102  96 - 112 mEq/L   CO2 27  19 - 32 mEq/L   Glucose, Bld 230 (*) 70 - 99 mg/dL   BUN 20  6 - 23 mg/dL   Creatinine, Ser 1.52 (*) 0.50 - 1.10 mg/dL   Calcium 8.6  8.4 - 10.5 mg/dL   GFR calc non Af Amer 36 (*) >90 mL/min   GFR calc Af Amer 41 (*) >90 mL/min   Comment:            The eGFR has been calculated     using the CKD EPI equation.  This calculation has not been     validated in all clinical     situations.     eGFR's persistently     <90 mL/min signify     possible Chronic Kidney Disease.  GLUCOSE, CAPILLARY     Status: Abnormal   Collection Time    05/23/12  7:23 AM      Result Value Range   Glucose-Capillary 201 (*) 70 - 99 mg/dL   Comment 1 Notify RN     Comment 2 Documented in Chart      Diagnostics:  Dg Chest Port 1 View  05/21/2012  *RADIOLOGY REPORT*  Clinical Data: Chest pain  PORTABLE CHEST - 1 VIEW  Comparison: 12/27/2009  Findings: Lungs are clear.  No pleural effusion or pneumothorax.  The heart is top normal in size/mildly enlarged.  IMPRESSION: No evidence of acute cardiopulmonary disease.   Original Report Authenticated By: Julian Hy, M.D.    Procedures: none  EKG: Normal sinus rhythm with PACs  Full Code   Hospital Course: See H&P for complete admission details. The patient is a 62 year old white female with history of coronary artery disease and diabetes. She presented with blood glucoses above 500 for several weeks. She had polyuria polydipsia and weakness. She also had some chest pain. In the emergency room, her potassium is 3.4. Glucose 633. Normal anion gap. EKG and troponin were normal. She was started on an insulin drip. She ruled out for MI. She is an appointment coming up with Dr. Lattie Haw. She was transitioned to subcutaneous insulin and her home med regimen has been adjusted. By the time of discharge, her blood glucoses were much improved. She had no  further chest pain and was feeling much better. Total time on the day of discharge greater than 30 minutes.  Discharge Exam:  Blood pressure 148/66, pulse 64, temperature 97 F (36.1 C), temperature source Oral, resp. rate 20, height 5\' 7"  (1.702 m), weight 100.699 kg (222 lb), SpO2 98.00%.  Lungs clear to auscultation bilaterally without wheeze rhonchi or rales Cardiovascular regular rate rhythm without murmurs gallops rubs exam abdomen soft nontender nondistended Extremities no clubbing cyanosis or edema  Signed: SULLIVAN,CORINNA L 05/23/2012, 11:22 AM

## 2012-05-23 NOTE — Plan of Care (Signed)
Problem: Phase II Progression Outcomes Goal: Patient able to draw up & self administer Insulin Outcome: Completed/Met Date Met:  05/23/12 05/23/12 1226 states uses insulin pen at home and comfortable with use.   Problem: Phase III Progression Outcomes Goal: Activity at appropriate level-compared to baseline (UP IN CHAIR FOR HEMODIALYSIS)  Outcome: Completed/Met Date Met:  05/23/12 05/23/12 1229 Patient ambulated in hallway this morning, tolerated well.

## 2012-05-24 ENCOUNTER — Encounter: Payer: Self-pay | Admitting: *Deleted

## 2012-05-24 ENCOUNTER — Ambulatory Visit: Payer: Medicare PPO | Admitting: Cardiology

## 2012-05-24 ENCOUNTER — Encounter: Payer: Self-pay | Admitting: Cardiology

## 2012-05-24 ENCOUNTER — Ambulatory Visit (INDEPENDENT_AMBULATORY_CARE_PROVIDER_SITE_OTHER): Payer: Medicaid Other | Admitting: Cardiology

## 2012-05-24 VITALS — BP 144/78 | HR 64 | Ht 67.0 in | Wt 226.4 lb

## 2012-05-24 DIAGNOSIS — D649 Anemia, unspecified: Secondary | ICD-10-CM

## 2012-05-24 DIAGNOSIS — N183 Chronic kidney disease, stage 3 unspecified: Secondary | ICD-10-CM

## 2012-05-24 DIAGNOSIS — E039 Hypothyroidism, unspecified: Secondary | ICD-10-CM

## 2012-05-24 DIAGNOSIS — K219 Gastro-esophageal reflux disease without esophagitis: Secondary | ICD-10-CM

## 2012-05-24 DIAGNOSIS — E1149 Type 2 diabetes mellitus with other diabetic neurological complication: Secondary | ICD-10-CM

## 2012-05-24 DIAGNOSIS — R079 Chest pain, unspecified: Secondary | ICD-10-CM

## 2012-05-24 DIAGNOSIS — E1142 Type 2 diabetes mellitus with diabetic polyneuropathy: Secondary | ICD-10-CM

## 2012-05-24 DIAGNOSIS — E669 Obesity, unspecified: Secondary | ICD-10-CM

## 2012-05-24 DIAGNOSIS — E785 Hyperlipidemia, unspecified: Secondary | ICD-10-CM

## 2012-05-24 DIAGNOSIS — I10 Essential (primary) hypertension: Secondary | ICD-10-CM

## 2012-05-24 MED ORDER — LISINOPRIL 5 MG PO TABS: 5.0000 mg | ORAL_TABLET | Freq: Every day | ORAL | Status: DC

## 2012-05-24 MED ORDER — NITROGLYCERIN 0.4 MG SL SUBL: 0.4000 mg | SUBLINGUAL_TABLET | SUBLINGUAL | Status: DC | PRN

## 2012-05-24 MED ORDER — CARVEDILOL 25 MG PO TABS: 25.0000 mg | ORAL_TABLET | Freq: Two times a day (BID) | ORAL | Status: DC

## 2012-05-24 NOTE — Assessment & Plan Note (Addendum)
Creatinine increased to 1.8 at the time of recent hospital admission, but reverted back to baseline of 1.5. GFR is calculated at 35 cc per minute. Stable renal disease with mild to moderate dysfunction.  ACE inhibitor will be added for potential renal preservation with continued close monitoring of renal function.

## 2012-05-24 NOTE — Assessment & Plan Note (Signed)
Recent lipid profile excellent with current therapy, which will be continued.

## 2012-05-24 NOTE — Progress Notes (Deleted)
Name: [name]    DOB: 1950/06/24  Age: 62 y.o.  MR#: [id number]       PCP:  Anthoney Harada, MD      Insurance: Payor: [employer] MEDICARE  Plan: [employer] MEDICARE CHOICE PPO  Product Type: *No Product type*    CC:   No chief complaint on file.  MEDICATION LIST IS POST HOSPITAL  - HCTZ WAS STOPPED UPON D/C AND ASA 81 MG ADDED, HOWEVER DID NOT START ASA   VS Filed Vitals:   05/24/12 1418  BP: 144/78  Pulse: 64  Height: 5\' 7"  (1.702 m)  Weight: 226 lb 6.4 oz (102.694 kg)  SpO2: 94%    Weights Current Weight  05/24/12 226 lb 6.4 oz (102.694 kg)  05/21/12 222 lb (100.699 kg)  04/29/12 222 lb (100.699 kg)    Blood Pressure  BP Readings from Last 3 Encounters:  05/24/12 144/78  05/23/12 148/66  04/29/12 152/88     Admit date:  (Not on file) Last encounter with RMR:  04/29/2012   Allergy Latex; Iodinated diagnostic agents; Vancomycin hcl; and Quinapril hcl  Current Outpatient Prescriptions  Medication Sig Dispense Refill  . allopurinol (ZYLOPRIM) 100 MG tablet Take 100 mg by mouth every morning.       Marland Kitchen amLODipine (NORVASC) 5 MG tablet Take 5 mg by mouth 2 (two) times daily.       . carvedilol (COREG) 12.5 MG tablet Take 1 tablet (12.5 mg total) by mouth 2 (two) times daily.  180 tablet  3  . Choline Fenofibrate (TRILIPIX) 135 MG capsule Take 135 mg by mouth every morning.       . citalopram (CELEXA) 40 MG tablet Take 40 mg by mouth at bedtime.       . furosemide (LASIX) 20 MG tablet Take 1 tablet (20 mg total) by mouth daily as needed (swelling).  30 tablet    . gabapentin (NEURONTIN) 800 MG tablet Take 2,400 mg by mouth at bedtime.       . insulin aspart (NOVOLOG FLEXPEN) 100 UNIT/ML injection Inject 25 Units into the skin 2 (two) times daily.       . insulin glargine (LANTUS) 100 UNIT/ML injection Inject 50 Units into the skin 2 (two) times daily. Injects 41 units in the morning and 43 units at bedtime  10 mL    . levothyroxine (SYNTHROID, LEVOTHROID) 50 MCG tablet Take  50 mcg by mouth every morning.       . nitroGLYCERIN (NITROSTAT) 0.4 MG SL tablet Place 1 tablet (0.4 mg total) under the tongue every 5 (five) minutes as needed for chest pain.  10 tablet  0  . omeprazole (PRILOSEC) 20 MG capsule Take 20 mg by mouth every morning.       . potassium chloride (K-DUR) 10 MEQ tablet Take 1 tablet (10 mEq total) by mouth daily as needed (when you take furosemide).      . pravastatin (PRAVACHOL) 40 MG tablet Take 40 mg by mouth at bedtime.       Marland Kitchen rOPINIRole (REQUIP) 1 MG tablet Take 1 mg by mouth at bedtime.       No current facility-administered medications for this visit.    Discontinued Meds:    Medications Discontinued During This Encounter  Medication Reason  . aspirin 81 MG tablet Error    Patient Active Problem List  Diagnosis  . HYPOTHYROIDISM  . HYPERLIPIDEMIA  . GOUT  . OBESITY  . Anemia, normocytic normochromic  . HYPERTENSION  .  GASTROESOPHAGEAL REFLUX DISEASE  . Chest pain  . RLS (restless legs syndrome)  . Chronic kidney disease, stage III (moderate)  . DM type 2, uncontrolled, with neuropathy    LABS    Component Value Date/Time   NA 137 05/23/2012 0608   NA 137 05/22/2012 0510   NA 127* 05/21/2012 1517   K 3.3* 05/23/2012 0608   K 3.2* 05/22/2012 0510   K 3.4* 05/21/2012 1517   CL 102 05/23/2012 0608   CL 99 05/22/2012 0510   CL 85* 05/21/2012 1517   CO2 27 05/23/2012 0608   CO2 28 05/22/2012 0510   CO2 31 05/21/2012 1517   GLUCOSE 230* 05/23/2012 0608   GLUCOSE 410* 05/22/2012 1656   GLUCOSE 182* 05/22/2012 0510   BUN 20 05/23/2012 0608   BUN 26* 05/22/2012 0510   BUN 32* 05/21/2012 1517   CREATININE 1.52* 05/23/2012 0608   CREATININE 1.58* 05/22/2012 0510   CREATININE 1.80* 05/21/2012 1517   CREATININE 1.81* 05/15/2008 1046   CALCIUM 8.6 05/23/2012 0608   CALCIUM 8.7 05/22/2012 0510   CALCIUM 9.5 05/21/2012 1517   GFRNONAA 36* 05/23/2012 0608   GFRNONAA 34* 05/22/2012 0510   GFRNONAA 29* 05/21/2012 1517   GFRAA 41* 05/23/2012 0608   GFRAA 39* 05/22/2012  0510   GFRAA 34* 05/21/2012 1517   CMP     Component Value Date/Time   NA 137 05/23/2012 0608   K 3.3* 05/23/2012 0608   CL 102 05/23/2012 0608   CO2 27 05/23/2012 0608   GLUCOSE 230* 05/23/2012 0608   BUN 20 05/23/2012 0608   CREATININE 1.52* 05/23/2012 0608   CREATININE 1.81* 05/15/2008 1046   CALCIUM 8.6 05/23/2012 0608   PROT 7.2 05/22/2012 0510   ALBUMIN 3.0* 05/22/2012 0510   AST 13 05/22/2012 0510   ALT 13 05/22/2012 0510   ALKPHOS 102 05/22/2012 0510   BILITOT 0.2* 05/22/2012 0510   GFRNONAA 36* 05/23/2012 0608   GFRAA 41* 05/23/2012 0608       Component Value Date/Time   WBC 8.8 05/22/2012 0510   WBC 10.3 05/21/2012 1517   WBC 9.2 03/03/2010 2110   HGB 12.0 05/22/2012 0510   HGB 12.4 05/21/2012 1517   HGB 10.9* 03/03/2010 2110   HCT 35.5* 05/22/2012 0510   HCT 37.3 05/21/2012 1517   HCT 32.6* 03/03/2010 2110   MCV 82.6 05/22/2012 0510   MCV 82.3 05/21/2012 1517   MCV 85.8 03/03/2010 2110    Lipid Panel     Component Value Date/Time   CHOL 149 01/22/2009 0000   TRIG 287 01/22/2009 0000   HDL 21 01/22/2009 0000   CHOLHDL 7.1 05/15/2008 0513   VLDL 57* 05/15/2008 0513   LDLCALC 71 01/22/2009 0000    ABG    Component Value Date/Time   PHART 7.398 03/03/2010 2120   PCO2ART 37.8 03/03/2010 2120   PO2ART 74.9* 03/03/2010 2120   HCO3 22.8 03/03/2010 2120   TCO2 20.8 03/03/2010 2120   ACIDBASEDEF 1.3 03/03/2010 2120   O2SAT 94.5 03/03/2010 2120     Lab Results  Component Value Date   TSH 2.8 01/22/2009   BNP (last 3 results) No results found for this basename: PROBNP,  in the last 8760 hours Cardiac Panel (last 3 results)  Recent Labs  05/21/12 1727 05/21/12 2250 05/22/12 0510  TROPONINI <0.30 <0.30 <0.30    Iron/TIBC/Ferritin    Component Value Date/Time   IRON 75 08/03/2009 1955   TIBC 306 08/03/2009 1955   FERRITIN 169 08/03/2009  1955     EKG Orders placed during the hospital encounter of 05/21/12  . EKG 12-LEAD  . EKG 12-LEAD  . ED EKG  . ED EKG  . EKG     Prior Assessment  and Plan Problem List as of 05/24/2012     ICD-9-CM     Cardiology Problems   HYPERLIPIDEMIA   Last Assessment & Plan   04/29/2012 Office Visit Written 04/29/2012  4:29 PM by Yehuda Savannah, MD     Adequate control of hyperlipidemia with good total and LDL cholesterol, but elevated triglycerides.    HYPERTENSION   Last Assessment & Plan   04/29/2012 Office Visit Written 05/03/2012  9:26 AM by Yehuda Savannah, MD     Blood pressure control is suboptimal.  Dose of carvedilol will be increased, and pressure closely monitored.      Other   HYPOTHYROIDISM   GOUT   OBESITY   Anemia, normocytic normochromic   GASTROESOPHAGEAL REFLUX DISEASE   Chest pain   Last Assessment & Plan   04/29/2012 Office Visit Written 05/03/2012  9:25 AM by Yehuda Savannah, MD     Petra Kuba and extent of coronary artery disease is not entirely clear; prior records have been requested.    RLS (restless legs syndrome)   Chronic kidney disease, stage III (moderate)   Last Assessment & Plan   04/29/2012 Office Visit Written 04/29/2012  4:35 PM by Yehuda Savannah, MD     Renal function as measured by creatinine has improved substantially since 02/2010, but remains mildly to moderately impaired.    DM type 2, uncontrolled, with neuropathy       Imaging: Dg Chest Port 1 View  05/21/2012  *RADIOLOGY REPORT*  Clinical Data: Chest pain  PORTABLE CHEST - 1 VIEW  Comparison: 12/27/2009  Findings: Lungs are clear.  No pleural effusion or pneumothorax.  The heart is top normal in size/mildly enlarged.  IMPRESSION: No evidence of acute cardiopulmonary disease.   Original Report Authenticated By: Julian Hy, M.D.

## 2012-05-24 NOTE — Assessment & Plan Note (Signed)
Chest discomfort continues without documentation of myocardial ischemia. I've advised her to fill her prescription for nitroglycerin and to use it as needed in the way of an empiric trial. I suspect she will ultimately require coronary angiography to definitively diagnose or exclude CAD.

## 2012-05-24 NOTE — Assessment & Plan Note (Signed)
Borderline anemia on most recent assessment earlier this month.

## 2012-05-24 NOTE — Assessment & Plan Note (Signed)
Recent hospital admission with glucose of 600 at presentation.  Dr. Jacelyn Grip is managing and will adjust therapy.

## 2012-05-24 NOTE — Patient Instructions (Addendum)
Your physician recommends that you schedule a follow-up appointment in:  1 - Blood pressure check in 1 month 2 - Follow up with provider in 3 months  Your physician has requested that you regularly monitor and record your blood pressure readings at home. Please use the same machine at the same time of day to check your readings and record them to bring to your follow-up visit.  Your physician has recommended you make the following change in your medication:  1 - START Lisinopril 5 mg daily 2 - INCREASE Coreg to 25 mg twice daily  Your physician recommends that you return for lab work in: 3 weeks and 6 weeks

## 2012-05-24 NOTE — Progress Notes (Signed)
Patient ID: CYANNA CARLILE, female   DOB: 1951/03/10, 62 y.o.   MRN: [id number]  HPI: Schedule return visit for this nice woman with a history of chest pain but no documented coronary disease and multiple cardiovascular risk factors. She is admitted to hospital just a few days ago with chest pain and diabetes out of control.  Myocardial infarction was ruled out. She was given a prescription for nitroglycerin, but has not yet filled it.  Current Outpatient Prescriptions  Medication Sig Dispense Refill  . allopurinol (ZYLOPRIM) 100 MG tablet Take 100 mg by mouth every morning.       Marland Kitchen amLODipine (NORVASC) 5 MG tablet Take 5 mg by mouth 2 (two) times daily.       . carvedilol (COREG) 25 MG tablet Take 1 tablet (25 mg total) by mouth 2 (two) times daily.  180 tablet  3  . Choline Fenofibrate (TRILIPIX) 135 MG capsule Take 135 mg by mouth every morning.       . citalopram (CELEXA) 40 MG tablet Take 40 mg by mouth at bedtime.       . furosemide (LASIX) 20 MG tablet Take 1 tablet (20 mg total) by mouth daily as needed (swelling).  30 tablet    . gabapentin (NEURONTIN) 800 MG tablet Take 2,400 mg by mouth at bedtime.       . insulin aspart (NOVOLOG FLEXPEN) 100 UNIT/ML injection Inject 25 Units into the skin 2 (two) times daily.       . insulin glargine (LANTUS) 100 UNIT/ML injection Inject 50 Units into the skin 2 (two) times daily. Injects 41 units in the morning and 43 units at bedtime  10 mL    . levothyroxine (SYNTHROID, LEVOTHROID) 50 MCG tablet Take 50 mcg by mouth every morning.       . nitroGLYCERIN (NITROSTAT) 0.4 MG SL tablet Place 1 tablet (0.4 mg total) under the tongue every 5 (five) minutes as needed for chest pain.  50 tablet  0  . omeprazole (PRILOSEC) 20 MG capsule Take 20 mg by mouth every morning.       . potassium chloride (K-DUR) 10 MEQ tablet Take 1 tablet (10 mEq total) by mouth daily as needed (when you take furosemide).      . pravastatin (PRAVACHOL) 40 MG tablet Take 40 mg by mouth  at bedtime.       Marland Kitchen rOPINIRole (REQUIP) 1 MG tablet Take 1 mg by mouth at bedtime.      Marland Kitchen lisinopril (PRINIVIL,ZESTRIL) 5 MG tablet Take 1 tablet (5 mg total) by mouth daily.  90 tablet  3   No current facility-administered medications for this visit.   Allergies  Allergen Reactions  . Latex Shortness Of Breath and Swelling    Facial swelling  . Iodinated Diagnostic Agents     Reaction is unknown  . Vancomycin Hcl     REACTION: facial swelling  . Quinapril Hcl Swelling and Rash     Past medical history, social history, and family history reviewed and updated.  ROS: Denies dyspnea on exertion, palpitations, lightheadedness or syncope. All other systems reviewed and are negative.  PHYSICAL EXAM: BP 144/78  Pulse 64  Ht 5\' 7"  (1.702 m)  Wt 102.694 kg (226 lb 6.4 oz)  BMI 35.45 kg/m2  SpO2 94%;  Body mass index is 35.45 kg/(m^2). General-Well developed; no acute distress Body habitus-moderately overweight Neck-No JVD; no carotid bruits Lungs-clear lung fields; resonant to percussion Cardiovascular-normal PMI; normal S1 and S2; prominent S4  Abdomen-normal bowel sounds; soft and non-tender without masses or organomegaly Musculoskeletal-No deformities, no cyanosis or clubbing Neurologic-Normal cranial nerves; symmetric strength and tone Skin-Warm, no significant lesions Extremities-distal pulses intact; no edema  EKG: Tracing performed 05/21/2012 obtained and reviewed. Sinus rhythm present with PACs and nonspecific T-wave abnormality.  No previous tracing for comparison.  Jacqulyn Ducking, MD 05/24/2012  2:59 PM  ASSESSMENT AND PLAN

## 2012-05-24 NOTE — Assessment & Plan Note (Signed)
Blood pressure control is improved with increased dose of carvedilol despite discontinuation of thiazide diuretic. We will further increase dose to 25 mg twice a day and add lisinopril 5 mg per day. Patient will monitor home blood pressures and return for a nursing visit in one month.

## 2012-05-27 NOTE — Progress Notes (Signed)
UR Chart Review Completed  

## 2012-06-07 ENCOUNTER — Other Ambulatory Visit: Payer: Self-pay | Admitting: *Deleted

## 2012-06-11 ENCOUNTER — Other Ambulatory Visit: Payer: Self-pay | Admitting: Nurse Practitioner

## 2012-06-15 ENCOUNTER — Encounter: Payer: Self-pay | Admitting: *Deleted

## 2012-06-15 ENCOUNTER — Other Ambulatory Visit: Payer: Self-pay | Admitting: *Deleted

## 2012-06-15 DIAGNOSIS — D649 Anemia, unspecified: Secondary | ICD-10-CM

## 2012-06-15 DIAGNOSIS — K219 Gastro-esophageal reflux disease without esophagitis: Secondary | ICD-10-CM

## 2012-06-15 DIAGNOSIS — I10 Essential (primary) hypertension: Secondary | ICD-10-CM

## 2012-06-15 DIAGNOSIS — E669 Obesity, unspecified: Secondary | ICD-10-CM

## 2012-06-15 DIAGNOSIS — E039 Hypothyroidism, unspecified: Secondary | ICD-10-CM

## 2012-06-17 ENCOUNTER — Other Ambulatory Visit: Payer: Self-pay

## 2012-06-17 DIAGNOSIS — N186 End stage renal disease: Secondary | ICD-10-CM

## 2012-06-17 DIAGNOSIS — Z01810 Encounter for preprocedural cardiovascular examination: Secondary | ICD-10-CM

## 2012-06-18 ENCOUNTER — Encounter: Payer: Self-pay | Admitting: Vascular Surgery

## 2012-06-25 ENCOUNTER — Other Ambulatory Visit: Payer: Self-pay | Admitting: *Deleted

## 2012-06-25 DIAGNOSIS — D649 Anemia, unspecified: Secondary | ICD-10-CM

## 2012-06-25 DIAGNOSIS — K219 Gastro-esophageal reflux disease without esophagitis: Secondary | ICD-10-CM

## 2012-06-25 DIAGNOSIS — E039 Hypothyroidism, unspecified: Secondary | ICD-10-CM

## 2012-06-25 DIAGNOSIS — I10 Essential (primary) hypertension: Secondary | ICD-10-CM

## 2012-06-25 DIAGNOSIS — E669 Obesity, unspecified: Secondary | ICD-10-CM

## 2012-06-28 ENCOUNTER — Ambulatory Visit (INDEPENDENT_AMBULATORY_CARE_PROVIDER_SITE_OTHER): Payer: Medicare PPO | Admitting: *Deleted

## 2012-06-28 ENCOUNTER — Encounter: Payer: Self-pay | Admitting: Cardiology

## 2012-06-28 VITALS — BP 144/75 | HR 68 | Ht 67.0 in | Wt 230.0 lb

## 2012-06-28 DIAGNOSIS — I10 Essential (primary) hypertension: Secondary | ICD-10-CM

## 2012-06-28 NOTE — Progress Notes (Signed)
Presents for blood pressure check s/p medication changes on 3/3.  Brings a list of blood pressures all over the board from 130s to 160s.  List scanned into media, for your review.  Lisinopril 5 added, as well as an increase in coreg to 25 mg bid.

## 2012-06-29 NOTE — Progress Notes (Signed)
Patient ID: [name], female   DOB: January 26, 1951, 62 y.o.   MRN: [id number]  Patient was due for Bmet on 3/24-please obtain. She is also due for a Bmet in mid April-change to first week in May. Increase amlodipine to 10 mg per day. Increase lisinopril to 10 mg per day Continue home BPs. BP check when she returns for blood draw in 07/2012.

## 2012-06-30 ENCOUNTER — Other Ambulatory Visit: Payer: Self-pay | Admitting: *Deleted

## 2012-06-30 DIAGNOSIS — I10 Essential (primary) hypertension: Secondary | ICD-10-CM

## 2012-06-30 MED ORDER — AMLODIPINE BESYLATE 10 MG PO TABS: 5.0000 mg | ORAL_TABLET | Freq: Two times a day (BID) | ORAL | Status: DC

## 2012-06-30 MED ORDER — LISINOPRIL 10 MG PO TABS: 10.0000 mg | ORAL_TABLET | Freq: Every day | ORAL | Status: DC

## 2012-06-30 NOTE — Progress Notes (Signed)
Attempted to contact patient.  States she will call me back, as she is driving and cannot take down instructions.

## 2012-06-30 NOTE — Progress Notes (Signed)
Patient made aware of recommendations and verbalized understanding.

## 2012-07-02 ENCOUNTER — Other Ambulatory Visit: Payer: Self-pay | Admitting: *Deleted

## 2012-07-02 DIAGNOSIS — I10 Essential (primary) hypertension: Secondary | ICD-10-CM

## 2012-07-07 ENCOUNTER — Encounter: Payer: Self-pay | Admitting: *Deleted

## 2012-07-08 ENCOUNTER — Other Ambulatory Visit: Payer: Self-pay

## 2012-07-08 MED ORDER — LEVOTHYROXINE SODIUM 50 MCG PO TABS: 50.0000 ug | ORAL_TABLET | Freq: Every morning | ORAL | Status: DC

## 2012-07-08 MED ORDER — ROPINIROLE HCL 1 MG PO TABS: 1.0000 mg | ORAL_TABLET | Freq: Every day | ORAL | Status: DC

## 2012-07-12 ENCOUNTER — Encounter: Payer: Self-pay | Admitting: Vascular Surgery

## 2012-07-13 ENCOUNTER — Ambulatory Visit (INDEPENDENT_AMBULATORY_CARE_PROVIDER_SITE_OTHER): Payer: Medicare PPO | Admitting: Vascular Surgery

## 2012-07-13 ENCOUNTER — Encounter (INDEPENDENT_AMBULATORY_CARE_PROVIDER_SITE_OTHER): Payer: Medicare PPO

## 2012-07-13 ENCOUNTER — Encounter: Payer: Self-pay | Admitting: Vascular Surgery

## 2012-07-13 VITALS — BP 136/75 | HR 70 | Resp 16 | Ht 67.0 in | Wt 225.0 lb

## 2012-07-13 DIAGNOSIS — N184 Chronic kidney disease, stage 4 (severe): Secondary | ICD-10-CM

## 2012-07-13 DIAGNOSIS — Z01810 Encounter for preprocedural cardiovascular examination: Secondary | ICD-10-CM

## 2012-07-13 DIAGNOSIS — N186 End stage renal disease: Secondary | ICD-10-CM

## 2012-07-13 NOTE — Progress Notes (Signed)
[facility] [place]   Patient name: [name] MRN: [id number] DOB: [birth year] Sex: female   Referred by: Theador Hawthorne  Reason for referral:  Chief Complaint  Patient presents with  . ESRD    Eval for access placement for dialysis, Ref. by Dr. Lowanda Foster    HISTORY OF PRESENT ILLNESS: The patient is a for AV access placement. She has had progressive renal insufficiency. Creatinine is a 2.3 range. She is currently on hemodialysis. She does report nausea and fatigue.  Past Medical History  Diagnosis Date  . Chest pain 2003    ?h/o MI, syncope; 2003 stress nuclear-inferoseptal scar, EF-54%; 2007-MI ruled out, echocardiogram-normal LV size, wall thickness and function  . RLS (restless legs syndrome)     sleep study in 2010: No apnea; moderate periodic movements  . GERD (gastroesophageal reflux disease)     H/o upper GI bleed  . GI bleeding   . Obese   . Gout   . Hypothyroidism   . Depression     Insomnia  . Low back pain   . Cellulitis     Neck  . Diabetes mellitus 1998    A1c-7.1 in 2010, 6.7 in 2013  . Hyperlipidemia     Fasting lipids 2013:158, 152, 35, 93  . Hypertension 2002  . Chronic kidney disease, stage III (moderate)     Creat-1.9 in 04/2008, 2.65 in 02/2010  . Anemia   . Hyperkalemia   . Irregular heart beat   . CAD (coronary artery disease)     Past Surgical History  Procedure Laterality Date  . Appendectomy    . Carpal tunnel release  1992, 1993, 2011    Right in 2011; previously bilateral  . Tubal ligation  1976  . Partial hysterectomy  1985    Vaginal  . Shoulder surgery  1997    Left  . Incise and drain abcess  2010    Neck-MRSA  . Colonoscopy  02/2002, 07/2009    few diverticula, otherwise negative  . Foot surgery  1984    Bilateral heel spurs  . Abdominal hysterectomy      History   Social History  . Marital Status: Divorced    Spouse Name: N/A    Number of Children: 3  . Years of Education: N/A   Occupational  History  . Disabled     previously Nurse, adult   Social History Main Topics  . Smoking status: Never Smoker   . Smokeless tobacco: Never Used  . Alcohol Use: No  . Drug Use: No  . Sexually Active: No   Other Topics Concern  . Not on file   Social History Narrative   No regular exercise    Family History  Problem Relation Age of Onset  . Emphysema Mother   . COPD Mother   . Colon cancer Father   . Diabetes Father   . Cancer Father     colon  . Hypertension Father   . Congestive Heart Failure Maternal Grandmother   . Cancer Paternal Grandmother     Allergies as of 07/13/2012 - Review Complete 07/13/2012  Allergen Reaction Noted  . Latex Shortness Of Breath and Swelling 01/22/2009  . Iodinated diagnostic agents  04/29/2012  . Vancomycin hcl  01/22/2009  . Quinapril hcl Swelling and Rash     Current Outpatient Prescriptions on File Prior to Visit  Medication Sig Dispense Refill  . allopurinol (ZYLOPRIM) 100 MG tablet Take 100 mg by mouth every morning.       Marland Kitchen  amLODipine (NORVASC) 10 MG tablet Take 0.5 tablets (5 mg total) by mouth 2 (two) times daily.  60 tablet  6  . aspirin 81 MG tablet Take 81 mg by mouth daily.      . carvedilol (COREG) 25 MG tablet Take 12.5 mg by mouth 2 (two) times daily.      . Choline Fenofibrate (TRILIPIX) 135 MG capsule Take 135 mg by mouth every morning.       . citalopram (CELEXA) 40 MG tablet Take 40 mg by mouth at bedtime.       . furosemide (LASIX) 20 MG tablet Take 1 tablet (20 mg total) by mouth daily as needed (swelling).  30 tablet    . gabapentin (NEURONTIN) 800 MG tablet Take 2,400 mg by mouth at bedtime.       . insulin aspart (NOVOLOG FLEXPEN) 100 UNIT/ML injection Inject 25 Units into the skin 2 (two) times daily.       . insulin glargine (LANTUS) 100 UNIT/ML injection Inject 50 Units into the skin 2 (two) times daily. Injects 41 units in the morning and 43 units at bedtime  10 mL    . levothyroxine (SYNTHROID, LEVOTHROID) 50  MCG tablet Take 1 tablet (50 mcg total) by mouth every morning.  30 tablet  9  . lisinopril (PRINIVIL,ZESTRIL) 10 MG tablet Take 1 tablet (10 mg total) by mouth daily.  90 tablet  3  . nitroGLYCERIN (NITROSTAT) 0.4 MG SL tablet Place 1 tablet (0.4 mg total) under the tongue every 5 (five) minutes as needed for chest pain.  50 tablet  0  . omeprazole (PRILOSEC) 20 MG capsule Take 20 mg by mouth every morning.       . potassium chloride (K-DUR) 10 MEQ tablet Take 1 tablet (10 mEq total) by mouth daily as needed (when you take furosemide).      . pravastatin (PRAVACHOL) 40 MG tablet Take 40 mg by mouth at bedtime.       Marland Kitchen rOPINIRole (REQUIP) 1 MG tablet Take 1 tablet (1 mg total) by mouth at bedtime.  30 tablet  2  . hydrochlorothiazide (HYDRODIURIL) 12.5 MG tablet Take 12.5 mg by mouth daily.       No current facility-administered medications on file prior to visit.     REVIEW OF SYSTEMS:  Positives indicated with an "X"  CARDIOVASCULAR:  [ ]  chest pain   [ ]  chest pressure   [ ]  palpitations   [ ]  orthopnea   [x ] dyspnea on exertion   [ ]  claudication   [ ]  rest pain   [ ]  DVT   [ ]  phlebitis PULMONARY:   [ ]  productive cough   [ ]  asthma   [ ]  wheezing NEUROLOGIC:   [x ] weakness  [ ]  paresthesias  [ ]  aphasia  [ ]  amaurosis  [ ]  dizziness HEMATOLOGIC:   [ ]  bleeding problems   [ ]  clotting disorders MUSCULOSKELETAL:  [ ]  joint pain   [ ]  joint swelling GASTROINTESTINAL: [ ]   blood in stool  [ ]   hematemesis GENITOURINARY:  [x ]  dysuria  [ ]   hematuria PSYCHIATRIC:  [ ]  history of major depression INTEGUMENTARY:  [x ] rashes  [ ]  ulcers CONSTITUTIONAL:  [ ]  fever   [ ]  chills  PHYSICAL EXAMINATION:  General: The patient is a well-nourished female, in no acute distress. Vital signs are BP 136/75  Pulse 70  Resp 16  Ht 5\' 7"  (1.702 m)  Wt 225 lb (102.059 kg)  BMI 35.23 kg/m2  SpO2 96% Pulmonary: There is a good air exchange bilaterally . Abdomen: Soft and non-tender   Musculoskeletal: There are no major deformities.  There is no significant extremity pain. Neurologic: No focal weakness or paresthesias are detected, Skin: There are no ulcer or rashes noted. Psychiatric: The patient has normal affect. Pulse status: 2+ radial pulses. A very nice cephalic vein at the wrist on the left. Smaller on the right.   [facility] Vascular Lab Studies:  Ordered and Independently Reviewed venous duplex shows good caliber cephalic vein throughout its course on the left. Patent cephalic vein on the right but smaller  Impression and Plan:  Chronic renal insufficiency approaching need for hemodialysis. Discussed hemodialysis catheter, AV graft and AV fistula with the patient. She does have an excellent cephalic vein for AV fistula creation in her left wrist. Have recommended left Cimino AV fistula creation explain the procedure to her. She wishes to do this as soon as possible. I do not have the OR time until June 5. She prefers to have one of my partners proceed with this coming week which we will schedule.    Anays Detore [facility] [place] Office: [phone]

## 2012-07-14 ENCOUNTER — Other Ambulatory Visit: Payer: Self-pay

## 2012-07-15 ENCOUNTER — Encounter (HOSPITAL_COMMUNITY): Payer: Self-pay

## 2012-07-15 ENCOUNTER — Encounter (HOSPITAL_COMMUNITY)
Admission: RE | Admit: 2012-07-15 | Discharge: 2012-07-15 | Disposition: A | Discharge status: Home / Self Care | Payer: Medicare PPO | Source: Ambulatory Visit | Attending: Vascular Surgery | Admitting: Vascular Surgery

## 2012-07-15 ENCOUNTER — Telehealth: Payer: Self-pay | Admitting: Family Medicine

## 2012-07-15 ENCOUNTER — Ambulatory Visit (HOSPITAL_COMMUNITY)
Admission: RE | Admit: 2012-07-15 | Discharge: 2012-07-15 | Disposition: A | Discharge status: Home / Self Care | Payer: Medicare PPO | Source: Ambulatory Visit | Attending: Vascular Surgery | Admitting: Vascular Surgery

## 2012-07-15 DIAGNOSIS — E119 Type 2 diabetes mellitus without complications: Secondary | ICD-10-CM

## 2012-07-15 DIAGNOSIS — E876 Hypokalemia: Secondary | ICD-10-CM

## 2012-07-15 HISTORY — DX: Vitiligo: L80

## 2012-07-15 HISTORY — DX: Malignant (primary) neoplasm, unspecified: C80.1

## 2012-07-15 LAB — SURGICAL PCR SCREEN: MRSA, PCR: NEGATIVE

## 2012-07-15 MED ORDER — GLUCOSE BLOOD VI STRP: ORAL_STRIP | Status: DC

## 2012-07-15 MED ORDER — DEXTROSE 5 % IV SOLN
1.5000 g | INTRAVENOUS | Status: AC
  Administered 2012-07-16: 1.5 g via INTRAVENOUS
  Filled 2012-07-15: qty 1.5

## 2012-07-15 MED ORDER — POTASSIUM CHLORIDE CRYS ER 10 MEQ PO TBCR: 10.0000 meq | EXTENDED_RELEASE_TABLET | Freq: Two times a day (BID) | ORAL | Status: DC

## 2012-07-15 NOTE — Telephone Encounter (Signed)
Spoke with pt and rx raxed to [name]

## 2012-07-15 NOTE — Pre-Procedure Instructions (Signed)
DIANNA SIXKILLER  07/15/2012   Your procedure is scheduled on: Friday   07/16/12   Report to [place] [facility] at 640 AM.  Call this number if you have problems the morning of surgery: [phone]   Remember:   Do not eat food or drink liquids after midnight.   Take these medicines the morning of surgery with A SIP OF WATER: allopurinol, amlodipine(norvasc), carvedilol(coreg), neurontin, potassium, levothroid, prilosec      Do not wear jewelry, make-up or nail polish.  Do not wear lotions, powders, or perfumes. You may wear deodorant.  Do not shave 48 hours prior to surgery. Men may shave face and neck.  Do not bring valuables to the hospital.  Contacts, dentures or bridgework may not be worn into surgery.  Leave suitcase in the car. After surgery it may be brought to your room.  For patients admitted to the hospital, checkout time is 11:00 AM the day of  discharge.   Patients discharged the day of surgery will not be allowed to drive  home.  Name and phone number of your driver:  Special Instructions: Shower using CHG 2 nights before surgery and the night before surgery.  If you shower the day of surgery use CHG.  Use special wash - you have one bottle of CHG for all showers.  You should use approximately 1/3 of the bottle for each shower.   Please read over the following fact sheets that you were given: Pain Booklet, Coughing and Deep Breathing, MRSA Information and Surgical Site Infection Prevention

## 2012-07-15 NOTE — Progress Notes (Signed)
07/15/12 1236  OBSTRUCTIVE SLEEP APNEA  Have you ever been diagnosed with sleep apnea through a sleep study? No  Do you snore loudly (loud enough to be heard through closed doors)?  0  Do you often feel tired, fatigued, or sleepy during the daytime? 1  Has anyone observed you stop breathing during your sleep? 0  Do you have, or are you being treated for high blood pressure? 1  BMI more than 35 kg/m2? 1  Age over 63 years old? 1  Neck circumference greater than 40 cm/18 inches? 0 (18 IN )  Gender: 0  Obstructive Sleep Apnea Score 4  Score 4 or greater  Results sent to PCP

## 2012-07-15 NOTE — Telephone Encounter (Signed)
[facility] notified and requestedcthey send across electronically her rx for test strips and k+ as we have not seen pt yet.  Pt aware [facility] has been notified.

## 2012-07-15 NOTE — Telephone Encounter (Signed)
Pt notified and informed rx sent to [facility] [place]

## 2012-07-16 ENCOUNTER — Ambulatory Visit (HOSPITAL_COMMUNITY): Payer: Medicare PPO | Admitting: Anesthesiology

## 2012-07-16 ENCOUNTER — Encounter (HOSPITAL_COMMUNITY)
Admission: RE | Disposition: A | Discharge status: Home / Self Care | Payer: Self-pay | Source: Ambulatory Visit | Attending: Vascular Surgery

## 2012-07-16 ENCOUNTER — Encounter (HOSPITAL_COMMUNITY): Payer: Self-pay | Admitting: *Deleted

## 2012-07-16 ENCOUNTER — Encounter (HOSPITAL_COMMUNITY): Payer: Self-pay | Admitting: Anesthesiology

## 2012-07-16 ENCOUNTER — Ambulatory Visit: Payer: Self-pay | Admitting: Family Medicine

## 2012-07-16 ENCOUNTER — Ambulatory Visit (HOSPITAL_COMMUNITY)
Admission: RE | Admit: 2012-07-16 | Discharge: 2012-07-16 | Disposition: A | Discharge status: Home / Self Care | Payer: Medicare PPO | Source: Ambulatory Visit | Attending: Vascular Surgery | Admitting: Vascular Surgery

## 2012-07-16 DIAGNOSIS — Z7982 Long term (current) use of aspirin: Secondary | ICD-10-CM | POA: Insufficient documentation

## 2012-07-16 DIAGNOSIS — Z888 Allergy status to other drugs, medicaments and biological substances status: Secondary | ICD-10-CM | POA: Insufficient documentation

## 2012-07-16 DIAGNOSIS — N186 End stage renal disease: Secondary | ICD-10-CM

## 2012-07-16 DIAGNOSIS — Z91041 Radiographic dye allergy status: Secondary | ICD-10-CM | POA: Insufficient documentation

## 2012-07-16 DIAGNOSIS — K219 Gastro-esophageal reflux disease without esophagitis: Secondary | ICD-10-CM | POA: Insufficient documentation

## 2012-07-16 DIAGNOSIS — Z79899 Other long term (current) drug therapy: Secondary | ICD-10-CM | POA: Insufficient documentation

## 2012-07-16 DIAGNOSIS — E875 Hyperkalemia: Secondary | ICD-10-CM | POA: Insufficient documentation

## 2012-07-16 DIAGNOSIS — Z91040 Latex allergy status: Secondary | ICD-10-CM | POA: Insufficient documentation

## 2012-07-16 DIAGNOSIS — Z794 Long term (current) use of insulin: Secondary | ICD-10-CM | POA: Insufficient documentation

## 2012-07-16 DIAGNOSIS — Z881 Allergy status to other antibiotic agents status: Secondary | ICD-10-CM | POA: Insufficient documentation

## 2012-07-16 DIAGNOSIS — M109 Gout, unspecified: Secondary | ICD-10-CM | POA: Insufficient documentation

## 2012-07-16 DIAGNOSIS — E119 Type 2 diabetes mellitus without complications: Secondary | ICD-10-CM | POA: Insufficient documentation

## 2012-07-16 DIAGNOSIS — E669 Obesity, unspecified: Secondary | ICD-10-CM | POA: Insufficient documentation

## 2012-07-16 DIAGNOSIS — E785 Hyperlipidemia, unspecified: Secondary | ICD-10-CM | POA: Insufficient documentation

## 2012-07-16 DIAGNOSIS — E039 Hypothyroidism, unspecified: Secondary | ICD-10-CM | POA: Insufficient documentation

## 2012-07-16 DIAGNOSIS — D649 Anemia, unspecified: Secondary | ICD-10-CM | POA: Insufficient documentation

## 2012-07-16 DIAGNOSIS — I2510 Atherosclerotic heart disease of native coronary artery without angina pectoris: Secondary | ICD-10-CM | POA: Insufficient documentation

## 2012-07-16 DIAGNOSIS — G2581 Restless legs syndrome: Secondary | ICD-10-CM | POA: Insufficient documentation

## 2012-07-16 DIAGNOSIS — I120 Hypertensive chronic kidney disease with stage 5 chronic kidney disease or end stage renal disease: Secondary | ICD-10-CM | POA: Insufficient documentation

## 2012-07-16 HISTORY — PX: AV FISTULA PLACEMENT: SHX1204

## 2012-07-16 LAB — GLUCOSE, CAPILLARY: Glucose-Capillary: 314 mg/dL — ABNORMAL HIGH (ref 70–99)

## 2012-07-16 LAB — POCT I-STAT 4, (NA,K, GLUC, HGB,HCT): Potassium: 3.9 mEq/L (ref 3.5–5.1)

## 2012-07-16 SURGERY — ARTERIOVENOUS (AV) FISTULA CREATION
Anesthesia: Monitor Anesthesia Care | Site: Arm Lower | Laterality: Left | Wound class: Clean

## 2012-07-16 MED ORDER — OXYCODONE HCL 5 MG/5ML PO SOLN: 5.0000 mg | Freq: Once | ORAL | Status: AC | PRN

## 2012-07-16 MED ORDER — PROPOFOL INFUSION 10 MG/ML OPTIME
INTRAVENOUS | Status: DC | PRN
  Administered 2012-07-16: 50 ug/kg/min via INTRAVENOUS

## 2012-07-16 MED ORDER — 0.9 % SODIUM CHLORIDE (POUR BTL) OPTIME
TOPICAL | Status: DC | PRN
  Administered 2012-07-16: 1000 mL

## 2012-07-16 MED ORDER — SODIUM CHLORIDE 0.9 % IV SOLN
INTRAVENOUS | Status: DC
  Administered 2012-07-16: 20 mL/h via INTRAVENOUS

## 2012-07-16 MED ORDER — FENTANYL CITRATE 0.05 MG/ML IJ SOLN: 25.0000 ug | INTRAMUSCULAR | Status: DC | PRN

## 2012-07-16 MED ORDER — MIDAZOLAM HCL 5 MG/5ML IJ SOLN
INTRAMUSCULAR | Status: DC | PRN
  Administered 2012-07-16: 1 mg via INTRAVENOUS

## 2012-07-16 MED ORDER — FENTANYL CITRATE 0.05 MG/ML IJ SOLN
INTRAMUSCULAR | Status: DC | PRN
  Administered 2012-07-16 (×3): 50 ug via INTRAVENOUS

## 2012-07-16 MED ORDER — LIDOCAINE-EPINEPHRINE (PF) 1 %-1:200000 IJ SOLN
INTRAMUSCULAR | Status: AC
  Filled 2012-07-16: qty 10

## 2012-07-16 MED ORDER — OXYCODONE-ACETAMINOPHEN 5-325 MG PO TABS: 1.0000 | ORAL_TABLET | ORAL | Status: DC | PRN

## 2012-07-16 MED ORDER — OXYCODONE HCL 5 MG PO TABS
5.0000 mg | ORAL_TABLET | Freq: Once | ORAL | Status: AC | PRN
  Administered 2012-07-16: 5 mg via ORAL

## 2012-07-16 MED ORDER — METOCLOPRAMIDE HCL 5 MG/ML IJ SOLN: 10.0000 mg | Freq: Once | INTRAMUSCULAR | Status: DC | PRN

## 2012-07-16 MED ORDER — OXYCODONE HCL 5 MG PO TABS
ORAL_TABLET | ORAL | Status: AC
  Filled 2012-07-16: qty 1

## 2012-07-16 MED ORDER — LIDOCAINE-EPINEPHRINE (PF) 1 %-1:200000 IJ SOLN
INTRAMUSCULAR | Status: DC | PRN
  Administered 2012-07-16: 30 mL

## 2012-07-16 MED ORDER — SODIUM CHLORIDE 0.9 % IR SOLN
Status: DC | PRN
  Administered 2012-07-16: 09:00:00

## 2012-07-16 MED ORDER — INSULIN ASPART 100 UNIT/ML ~~LOC~~ SOLN
SUBCUTANEOUS | Status: AC
  Administered 2012-07-16: 100 [IU] via SUBCUTANEOUS
  Filled 2012-07-16: qty 1

## 2012-07-16 SURGICAL SUPPLY — 36 items
CANISTER SUCTION 2500CC (MISCELLANEOUS) ×2 IMPLANT
CLIP TI MEDIUM 6 (CLIP) ×2 IMPLANT
CLIP TI WIDE RED SMALL 6 (CLIP) ×2 IMPLANT
CLOTH BEACON ORANGE TIMEOUT ST (SAFETY) ×2 IMPLANT
COVER PROBE W GEL 5X96 (DRAPES) ×2 IMPLANT
COVER SURGICAL LIGHT HANDLE (MISCELLANEOUS) ×2 IMPLANT
DECANTER SPIKE VIAL GLASS SM (MISCELLANEOUS) IMPLANT
DERMABOND ADVANCED (GAUZE/BANDAGES/DRESSINGS) ×1
DERMABOND ADVANCED .7 DNX12 (GAUZE/BANDAGES/DRESSINGS) ×1 IMPLANT
DRAIN PENROSE 1/2X12 LTX STRL (WOUND CARE) IMPLANT
ELECT REM PT RETURN 9FT ADLT (ELECTROSURGICAL) ×2
ELECTRODE REM PT RTRN 9FT ADLT (ELECTROSURGICAL) ×1 IMPLANT
GEL ULTRASOUND 20GR AQUASONIC (MISCELLANEOUS) IMPLANT
GLOVE BIOGEL PI IND STRL 6.5 (GLOVE) ×1 IMPLANT
GLOVE BIOGEL PI IND STRL 7.5 (GLOVE) ×1 IMPLANT
GLOVE BIOGEL PI INDICATOR 6.5 (GLOVE) ×1
GLOVE BIOGEL PI INDICATOR 7.5 (GLOVE) ×1
GLOVE SS BIOGEL STRL SZ 7 (GLOVE) IMPLANT
GLOVE SUPERSENSE BIOGEL SZ 7 (GLOVE)
GLOVE SURG SS PI 7.0 STRL IVOR (GLOVE) ×2 IMPLANT
GOWN STRL NON-REIN LRG LVL3 (GOWN DISPOSABLE) ×2 IMPLANT
GOWN STRL REIN XL XLG (GOWN DISPOSABLE) ×2 IMPLANT
KIT BASIN OR (CUSTOM PROCEDURE TRAY) ×2 IMPLANT
KIT ROOM TURNOVER OR (KITS) ×2 IMPLANT
NS IRRIG 1000ML POUR BTL (IV SOLUTION) ×2 IMPLANT
PACK CV ACCESS (CUSTOM PROCEDURE TRAY) ×2 IMPLANT
PAD ARMBOARD 7.5X6 YLW CONV (MISCELLANEOUS) ×4 IMPLANT
SPONGE GAUZE 4X4 12PLY (GAUZE/BANDAGES/DRESSINGS) ×2 IMPLANT
SUT PROLENE 6 0 BV (SUTURE) ×2 IMPLANT
SUT PROLENE 7 0 BV 1 (SUTURE) ×2 IMPLANT
SUT VIC AB 3-0 SH 27 (SUTURE) ×1
SUT VIC AB 3-0 SH 27X BRD (SUTURE) ×1 IMPLANT
TOWEL OR 17X24 6PK STRL BLUE (TOWEL DISPOSABLE) ×2 IMPLANT
TOWEL OR 17X26 10 PK STRL BLUE (TOWEL DISPOSABLE) ×2 IMPLANT
UNDERPAD 30X30 INCONTINENT (UNDERPADS AND DIAPERS) ×2 IMPLANT
WATER STERILE IRR 1000ML POUR (IV SOLUTION) ×2 IMPLANT

## 2012-07-16 NOTE — Progress Notes (Signed)
Patient noted to have thrill and bruit and left arm at site that was not easily heard or felt per Elta Guadeloupe, PACU RN this is how patient came out of surgery and MD is aware.

## 2012-07-16 NOTE — Op Note (Signed)
OPERATIVE REPORT  Date of Surgery: 07/16/2012  Surgeon: Tinnie Gens, MD  Assistant: Nurse  Pre-op Diagnosis: End Stage Renal Disease  Post-op Diagnosis: End Stage Renal Disease  Procedure: Procedure(s): ARTERIOVENOUS (AV) FISTULA CREATION-radial-cephalic Anesthesia: MAC  EBL: Minimal  Complications: None  Procedure Details: Patient was taken to the operating room placed in supine position at which time left upper extremity was prepped with Betadine scrub and solution draped in routine sterile manner. After infiltration with 1% Xylocaine with epinephrine a short longitudinal incision was made just proximal to the wrist midway between the radial artery and cephalic vein. Cephalic vein had to equal size branches which were dissected free ligated distally with 3-0 silk ties. Each was gently dilated with heparinized saline and the more medial one was selected and the other branch ligated with 3-0 silk tie to be dilated with heparin saline. The vein was slightly bigger than 2-1/2 mm in size. Radial artery was small vessel being about 2-1/2 mm in size. It was encircled with Vesseloops. An excellent pulse. It was occluded proximally and distally a 15 blade extended with Potts scissors would easily accept a 2-1/2 mm dilator. Vein was carefully measured spatulated and anastomosed end-to-side with 6-0 Prolene. Vesseloops were released and there was good Doppler flow to the antecubital area and the cephalic vein. There was not a palpable thrill however. I did service in the OR and there was continuous Doppler flow in the. Adequate hemostasis was achieved the wound closed in layers with Vicryl in a subcuticular fashion with Dermabond patient to the recovery room in stable condition   Tinnie Gens, MD 07/16/2012 10:03 AM

## 2012-07-16 NOTE — Anesthesia Postprocedure Evaluation (Signed)
Anesthesia Post Note  Patient: [name]  Procedure(s) Performed: Procedure(s) (LRB): ARTERIOVENOUS (AV) FISTULA CREATION (Left)  Anesthesia type: MAC  Patient location: PACU  Post pain: Pain level controlled  Post assessment: Patient's Cardiovascular Status Stable  Last Vitals:  Filed Vitals:   07/16/12 1015  BP: 100/50  Pulse:   Temp: 36.7 C  Resp:     Post vital signs: Reviewed and stable  Level of consciousness: alert  Complications: No apparent anesthesia complications

## 2012-07-16 NOTE — Preoperative (Addendum)
Beta Blockers  Coreg 25mg  @ 05:00     On 07-16-12

## 2012-07-16 NOTE — Progress Notes (Signed)
Patient reports numbness at site and left thumb. Patient reports that has been present since immediately after surgery. Patient educated to notify MD if this changes or does not get better by Saturday afternoon that numbing is used during the procedure and this should get better.

## 2012-07-16 NOTE — Anesthesia Procedure Notes (Signed)
Procedure Name: MAC Date/Time: 07/16/2012 8:45 AM Performed by: Wanita Chamberlain Pre-anesthesia Checklist: Suction available, Patient being monitored, Timeout performed, Emergency Drugs available and Patient identified Patient Re-evaluated:Patient Re-evaluated prior to inductionOxygen Delivery Method: Nasal cannula Intubation Type: IV induction Placement Confirmation: positive ETCO2

## 2012-07-16 NOTE — Interval H&P Note (Signed)
History and Physical Interval Note:  07/16/2012 8:34 AM  [name]  has presented today for surgery, with the diagnosis of End Stage Renal Disease  The various methods of treatment have been discussed with the patient and family. After consideration of risks, benefits and other options for treatment, the patient has consented to  Procedure(s) with comments: ARTERIOVENOUS (AV) FISTULA CREATION (Left) - Left Cimino Arteriovenous Fistula Creation as a surgical intervention .  The patient's history has been reviewed, patient examined, no change in status, stable for surgery.  I have reviewed the patient's chart and labs.  Questions were answered to the patient's satisfaction.     Tinnie Gens

## 2012-07-16 NOTE — Anesthesia Preprocedure Evaluation (Addendum)
Anesthesia Evaluation  Patient identified by MRN, date of birth, ID band Patient awake    Reviewed: Allergy & Precautions, H&P , NPO status , Patient's Chart, lab work & pertinent test results, reviewed documented beta blocker date and time   Airway Mallampati: II TM Distance: >3 FB Neck ROM: full    Dental  (+) Edentulous Upper and Edentulous Lower   Pulmonary shortness of breath and with exertion,  07-15-12  Chest x-ray Findings: Cardiomediastinal silhouette appears normal.  No acute pulmonary disease is noted.  Bony thorax is intact.   IMPRESSION: No acute cardiopulmonary abnormality seen.    breath sounds clear to auscultation        Cardiovascular hypertension, Pt. on medications and Pt. on home beta blockers + angina with exertion + CAD and + Past MI Rhythm:regular Rate:Normal     Neuro/Psych  Headaches, PSYCHIATRIC DISORDERS    GI/Hepatic Neg liver ROS, GERD-  Medicated and Controlled,  Endo/Other  diabetes, Type 2, Insulin DependentHypothyroidism Morbid obesity  Renal/GU CRFRenal disease  negative genitourinary   Musculoskeletal   Abdominal   Peds  Hematology  (+) anemia ,   Anesthesia Other Findings See surgeon's H&P   Reproductive/Obstetrics negative OB ROS                         Anesthesia Physical Anesthesia Plan  ASA: III  Anesthesia Plan: MAC   Post-op Pain Management:    Induction:   Airway Management Planned: Simple Face Mask and Nasal Cannula  Additional Equipment:   Intra-op Plan:   Post-operative Plan:   Informed Consent: I have reviewed the patients History and Physical, chart, labs and discussed the procedure including the risks, benefits and alternatives for the proposed anesthesia with the patient or authorized representative who has indicated his/her understanding and acceptance.   Dental Advisory Given  Plan Discussed with: CRNA and  Surgeon  Anesthesia Plan Comments:        Anesthesia Quick Evaluation

## 2012-07-16 NOTE — Transfer of Care (Signed)
Immediate Anesthesia Transfer of Care Note  Patient: [name]  Procedure(s) Performed: Procedure(s): ARTERIOVENOUS (AV) FISTULA CREATION (Left)  Patient Location: PACU  Anesthesia Type:MAC  Level of Consciousness: awake, alert , oriented and patient cooperative  Airway & Oxygen Therapy: Patient Spontanous Breathing  Post-op Assessment: Report given to PACU RN and Post -op Vital signs reviewed and stable  Post vital signs: Reviewed and stable  Complications: No apparent anesthesia complications

## 2012-07-16 NOTE — Progress Notes (Signed)
Spoke with Dr.Frederick and notified of blood sugar 313;orders received to give 15 units of Novolog subcut. And start iv of Normal Saline

## 2012-07-16 NOTE — H&P (View-Only) (Signed)
[facility] [place]   Patient name: [name] MRN: [id number] DOB: 11/27/1950 Sex: female   Referred by: Theador Hawthorne  Reason for referral:  Chief Complaint  Patient presents with  . ESRD    Eval for access placement for dialysis, Ref. by Dr. Lowanda Foster    HISTORY OF PRESENT ILLNESS: The patient is a for AV access placement. She has had progressive renal insufficiency. Creatinine is a 2.3 range. She is currently on hemodialysis. She does report nausea and fatigue.  Past Medical History  Diagnosis Date  . Chest pain 2003    ?h/o MI, syncope; 2003 stress nuclear-inferoseptal scar, EF-54%; 2007-MI ruled out, echocardiogram-normal LV size, wall thickness and function  . RLS (restless legs syndrome)     sleep study in 2010: No apnea; moderate periodic movements  . GERD (gastroesophageal reflux disease)     H/o upper GI bleed  . GI bleeding   . Obese   . Gout   . Hypothyroidism   . Depression     Insomnia  . Low back pain   . Cellulitis     Neck  . Diabetes mellitus 1998    A1c-7.1 in 2010, 6.7 in 2013  . Hyperlipidemia     Fasting lipids 2013:158, 152, 35, 93  . Hypertension 2002  . Chronic kidney disease, stage III (moderate)     Creat-1.9 in 04/2008, 2.65 in 02/2010  . Anemia   . Hyperkalemia   . Irregular heart beat   . CAD (coronary artery disease)     Past Surgical History  Procedure Laterality Date  . Appendectomy    . Carpal tunnel release  1992, 1993, 2011    Right in 2011; previously bilateral  . Tubal ligation  1976  . Partial hysterectomy  1985    Vaginal  . Shoulder surgery  1997    Left  . Incise and drain abcess  2010    Neck-MRSA  . Colonoscopy  02/2002, 07/2009    few diverticula, otherwise negative  . Foot surgery  1984    Bilateral heel spurs  . Abdominal hysterectomy      History   Social History  . Marital Status: Divorced    Spouse Name: N/A    Number of Children: 3  . Years of Education: N/A   Occupational  History  . Disabled     previously Nurse, adult   Social History Main Topics  . Smoking status: Never Smoker   . Smokeless tobacco: Never Used  . Alcohol Use: No  . Drug Use: No  . Sexually Active: No   Other Topics Concern  . Not on file   Social History Narrative   No regular exercise    Family History  Problem Relation Age of Onset  . Emphysema Mother   . COPD Mother   . Colon cancer Father   . Diabetes Father   . Cancer Father     colon  . Hypertension Father   . Congestive Heart Failure Maternal Grandmother   . Cancer Paternal Grandmother     Allergies as of 07/13/2012 - Review Complete 07/13/2012  Allergen Reaction Noted  . Latex Shortness Of Breath and Swelling 01/22/2009  . Iodinated diagnostic agents  04/29/2012  . Vancomycin hcl  01/22/2009  . Quinapril hcl Swelling and Rash     Current Outpatient Prescriptions on File Prior to Visit  Medication Sig Dispense Refill  . allopurinol (ZYLOPRIM) 100 MG tablet Take 100 mg by mouth every morning.       Marland Kitchen  amLODipine (NORVASC) 10 MG tablet Take 0.5 tablets (5 mg total) by mouth 2 (two) times daily.  60 tablet  6  . aspirin 81 MG tablet Take 81 mg by mouth daily.      . carvedilol (COREG) 25 MG tablet Take 12.5 mg by mouth 2 (two) times daily.      . Choline Fenofibrate (TRILIPIX) 135 MG capsule Take 135 mg by mouth every morning.       . citalopram (CELEXA) 40 MG tablet Take 40 mg by mouth at bedtime.       . furosemide (LASIX) 20 MG tablet Take 1 tablet (20 mg total) by mouth daily as needed (swelling).  30 tablet    . gabapentin (NEURONTIN) 800 MG tablet Take 2,400 mg by mouth at bedtime.       . insulin aspart (NOVOLOG FLEXPEN) 100 UNIT/ML injection Inject 25 Units into the skin 2 (two) times daily.       . insulin glargine (LANTUS) 100 UNIT/ML injection Inject 50 Units into the skin 2 (two) times daily. Injects 41 units in the morning and 43 units at bedtime  10 mL    . levothyroxine (SYNTHROID, LEVOTHROID) 50  MCG tablet Take 1 tablet (50 mcg total) by mouth every morning.  30 tablet  9  . lisinopril (PRINIVIL,ZESTRIL) 10 MG tablet Take 1 tablet (10 mg total) by mouth daily.  90 tablet  3  . nitroGLYCERIN (NITROSTAT) 0.4 MG SL tablet Place 1 tablet (0.4 mg total) under the tongue every 5 (five) minutes as needed for chest pain.  50 tablet  0  . omeprazole (PRILOSEC) 20 MG capsule Take 20 mg by mouth every morning.       . potassium chloride (K-DUR) 10 MEQ tablet Take 1 tablet (10 mEq total) by mouth daily as needed (when you take furosemide).      . pravastatin (PRAVACHOL) 40 MG tablet Take 40 mg by mouth at bedtime.       Marland Kitchen rOPINIRole (REQUIP) 1 MG tablet Take 1 tablet (1 mg total) by mouth at bedtime.  30 tablet  2  . hydrochlorothiazide (HYDRODIURIL) 12.5 MG tablet Take 12.5 mg by mouth daily.       No current facility-administered medications on file prior to visit.     REVIEW OF SYSTEMS:  Positives indicated with an "X"  CARDIOVASCULAR:  [ ]  chest pain   [ ]  chest pressure   [ ]  palpitations   [ ]  orthopnea   [x ] dyspnea on exertion   [ ]  claudication   [ ]  rest pain   [ ]  DVT   [ ]  phlebitis PULMONARY:   [ ]  productive cough   [ ]  asthma   [ ]  wheezing NEUROLOGIC:   [x ] weakness  [ ]  paresthesias  [ ]  aphasia  [ ]  amaurosis  [ ]  dizziness HEMATOLOGIC:   [ ]  bleeding problems   [ ]  clotting disorders MUSCULOSKELETAL:  [ ]  joint pain   [ ]  joint swelling GASTROINTESTINAL: [ ]   blood in stool  [ ]   hematemesis GENITOURINARY:  [x ]  dysuria  [ ]   hematuria PSYCHIATRIC:  [ ]  history of major depression INTEGUMENTARY:  [x ] rashes  [ ]  ulcers CONSTITUTIONAL:  [ ]  fever   [ ]  chills  PHYSICAL EXAMINATION:  General: The patient is a well-nourished female, in no acute distress. Vital signs are BP 136/75  Pulse 70  Resp 16  Ht 5\' 7"  (1.702 m)  Wt 225 lb (102.059 kg)  BMI 35.23 kg/m2  SpO2 96% Pulmonary: There is a good air exchange bilaterally . Abdomen: Soft and non-tender   Musculoskeletal: There are no major deformities.  There is no significant extremity pain. Neurologic: No focal weakness or paresthesias are detected, Skin: There are no ulcer or rashes noted. Psychiatric: The patient has normal affect. Pulse status: 2+ radial pulses. A very nice cephalic vein at the wrist on the left. Smaller on the right.   [facility] Vascular Lab Studies:  Ordered and Independently Reviewed venous duplex shows good caliber cephalic vein throughout its course on the left. Patent cephalic vein on the right but smaller  Impression and Plan:  Chronic renal insufficiency approaching need for hemodialysis. Discussed hemodialysis catheter, AV graft and AV fistula with the patient. She does have an excellent cephalic vein for AV fistula creation in her left wrist. Have recommended left Cimino AV fistula creation explain the procedure to her. She wishes to do this as soon as possible. I do not have the OR time until June 5. She prefers to have one of my partners proceed with this coming week which we will schedule.    Chamari Cutbirth [facility] [place] Office: [phone]

## 2012-07-19 ENCOUNTER — Encounter (HOSPITAL_COMMUNITY): Payer: Self-pay | Admitting: Vascular Surgery

## 2012-07-19 ENCOUNTER — Other Ambulatory Visit: Payer: Self-pay | Admitting: *Deleted

## 2012-07-19 MED ORDER — CITALOPRAM HYDROBROMIDE 40 MG PO TABS: 40.0000 mg | ORAL_TABLET | Freq: Every day | ORAL | Status: DC

## 2012-07-19 MED ORDER — OMEPRAZOLE 20 MG PO CPDR: 20.0000 mg | DELAYED_RELEASE_CAPSULE | Freq: Every morning | ORAL | Status: DC

## 2012-07-19 MED ORDER — PRAVASTATIN SODIUM 40 MG PO TABS: 40.0000 mg | ORAL_TABLET | Freq: Every day | ORAL | Status: DC

## 2012-07-19 MED ORDER — GABAPENTIN 800 MG PO TABS: 2400.0000 mg | ORAL_TABLET | Freq: Three times a day (TID) | ORAL | Status: DC

## 2012-07-20 ENCOUNTER — Other Ambulatory Visit: Payer: Self-pay | Admitting: *Deleted

## 2012-07-20 MED ORDER — GABAPENTIN 800 MG PO TABS: 800.0000 mg | ORAL_TABLET | Freq: Three times a day (TID) | ORAL | Status: DC

## 2012-07-23 LAB — BASIC METABOLIC PANEL
BUN: 43 mg/dL — ABNORMAL HIGH (ref 6–23)
CO2: 21 mEq/L (ref 19–32)
Calcium: 8.9 mg/dL (ref 8.4–10.5)
Chloride: 92 mEq/L — ABNORMAL LOW (ref 96–112)
Creat: 2.51 mg/dL — ABNORMAL HIGH (ref 0.50–1.10)
Glucose, Bld: 761 mg/dL (ref 70–99)

## 2012-07-24 ENCOUNTER — Encounter: Payer: Self-pay | Admitting: Cardiology

## 2012-07-25 ENCOUNTER — Telehealth: Payer: Self-pay | Admitting: Cardiology

## 2012-07-25 NOTE — Telephone Encounter (Signed)
Patient called due to markedly abnormal labs including  Na-126, BUN/Cr- 43/2.51 (increased from 20/1.52 2 months earlier), FBG-761.  Patient denies polyuria, polydipsia, lightheadedness, syncope, falls, polyphagia.  FBG at home has been in the low to mid 300s. Patient has appointment with PCP, Dr. Jacelyn Grip, tomorrow as well as with Cardiology. Medications will be adjusted including discontinuation of HCTZ and potassium and adjustment of insulin therapy. Patient might benefit from services provided by [facility].  Jacqulyn Ducking, MD

## 2012-07-26 ENCOUNTER — Encounter: Payer: Self-pay | Admitting: Family Medicine

## 2012-07-26 ENCOUNTER — Ambulatory Visit (INDEPENDENT_AMBULATORY_CARE_PROVIDER_SITE_OTHER): Payer: Medicare HMO

## 2012-07-26 ENCOUNTER — Ambulatory Visit (INDEPENDENT_AMBULATORY_CARE_PROVIDER_SITE_OTHER): Payer: Medicare PPO | Admitting: *Deleted

## 2012-07-26 ENCOUNTER — Ambulatory Visit (INDEPENDENT_AMBULATORY_CARE_PROVIDER_SITE_OTHER): Payer: Medicaid Other | Admitting: Family Medicine

## 2012-07-26 ENCOUNTER — Encounter: Payer: Self-pay | Admitting: Vascular Surgery

## 2012-07-26 VITALS — BP 115/70 | HR 93 | Temp 97.3°F | Ht 66.0 in | Wt 224.0 lb

## 2012-07-26 VITALS — BP 113/64 | HR 61 | Ht 66.0 in | Wt 225.2 lb

## 2012-07-26 DIAGNOSIS — E785 Hyperlipidemia, unspecified: Secondary | ICD-10-CM

## 2012-07-26 DIAGNOSIS — M25559 Pain in unspecified hip: Secondary | ICD-10-CM

## 2012-07-26 DIAGNOSIS — M109 Gout, unspecified: Secondary | ICD-10-CM

## 2012-07-26 DIAGNOSIS — E119 Type 2 diabetes mellitus without complications: Secondary | ICD-10-CM

## 2012-07-26 DIAGNOSIS — M25552 Pain in left hip: Secondary | ICD-10-CM | POA: Insufficient documentation

## 2012-07-26 DIAGNOSIS — I10 Essential (primary) hypertension: Secondary | ICD-10-CM

## 2012-07-26 DIAGNOSIS — Z1212 Encounter for screening for malignant neoplasm of rectum: Secondary | ICD-10-CM | POA: Insufficient documentation

## 2012-07-26 DIAGNOSIS — L409 Psoriasis, unspecified: Secondary | ICD-10-CM | POA: Insufficient documentation

## 2012-07-26 DIAGNOSIS — L408 Other psoriasis: Secondary | ICD-10-CM

## 2012-07-26 DIAGNOSIS — N189 Chronic kidney disease, unspecified: Secondary | ICD-10-CM

## 2012-07-26 LAB — HEPATIC FUNCTION PANEL
ALT: 14 U/L (ref 0–35)
AST: 14 U/L (ref 0–37)
Albumin: 4.1 g/dL (ref 3.5–5.2)
Alkaline Phosphatase: 93 U/L (ref 39–117)
Bilirubin, Direct: 0.1 mg/dL (ref 0.0–0.3)
Indirect Bilirubin: 0.4 mg/dL (ref 0.0–0.9)
Total Bilirubin: 0.5 mg/dL (ref 0.3–1.2)
Total Protein: 7.9 g/dL (ref 6.0–8.3)

## 2012-07-26 LAB — POCT UA - MICROALBUMIN

## 2012-07-26 LAB — BASIC METABOLIC PANEL WITH GFR
BUN: 44 mg/dL — ABNORMAL HIGH (ref 6–23)
CO2: 23 mEq/L (ref 19–32)
Calcium: 9.5 mg/dL (ref 8.4–10.5)
Chloride: 97 mEq/L (ref 96–112)
Creat: 2.54 mg/dL — ABNORMAL HIGH (ref 0.50–1.10)
GFR, Est African American: 22 mL/min — ABNORMAL LOW
GFR, Est Non African American: 19 mL/min — ABNORMAL LOW
Glucose, Bld: 227 mg/dL — ABNORMAL HIGH (ref 70–99)
Potassium: 4.7 mEq/L (ref 3.5–5.3)
Sodium: 133 mEq/L — ABNORMAL LOW (ref 135–145)

## 2012-07-26 LAB — TSH: TSH: 3.055 u[IU]/mL (ref 0.350–4.500)

## 2012-07-26 LAB — POCT GLYCOSYLATED HEMOGLOBIN (HGB A1C): Hemoglobin A1C: 12.5

## 2012-07-26 NOTE — Patient Instructions (Addendum)
Your physician recommends that you schedule a follow-up appointment in: NEXT WEEK   Your physician recommends that you return for lab work in: [place] has recommended you make the following change in your medication:   1) STOP LASIX 2) STOP POTASSIUM 3) STOP PRINIVIL   KEEP FOLLOW UP APT WITH PHARMACIST/WONG  YOUR PHYSICIAN RECOMMENDS THAT YOU BE SET UP FOR THN A staff member from [facility] will alert you the with appointment date and time, once available

## 2012-07-26 NOTE — Telephone Encounter (Signed)
Patient seen today 07/26/2012

## 2012-07-26 NOTE — Progress Notes (Signed)
Pt presented for NV per last NV discharge instructions to follow up  noted 06-28-12 Pt current VS 113/64 HR 61 pt advised that both her legs and feet cramped all night last night This nurse reviewed phone note from abnormal labs per Dr Jeri Lager on 07-25-12 advised pt to Stop HCTZ, Stop Potassium, adjustment to insulin per pt to have OV with MD Jacelyn Grip today, pt noted Dr Jacelyn Grip did not change any of her medications today, noted the following per Dr Jacelyn Grip discharge notes:  Follow-up and Disposition    Return in about 2 weeks (around 08/09/2012) for to see pharmacist for DM education and med review.     Noted in [place] discharge notes today 07-26-12 from Dr [facility] office   Reviewed information with [facility] NP per pt confirmed not on HCTZ but Lasix, review instructions in progress note below from [facility]  Pt schedule for follow up with [facility] on 08-03-12, discharge summary given, email sent out for [facility] rep to contact pt   Shara Blazing LPN  Last NV VS/discharge summary from 06-28-12 as follows:  BP Pulse Ht Wt BMI     144/75 68 5\' 7"  (1.702 m) 230 lb (104.327 kg) 36.01 kg/m2        Vitals History Recorded    Patient was due for Bmet on 3/24-please obtain. (obtained 07-22-12) She is also due for a Bmet in mid April-change to first week in May.  Increase amlodipine to 10 mg per day.  Increase lisinopril to 10 mg per day  Continue home BPs.  BP check when she returns for blood draw in 07/2012.  Notes from TCM on 06-28-12 at last NV

## 2012-07-26 NOTE — Telephone Encounter (Signed)
See nurse visit for today 07-26-12 to note changes made by [facility]

## 2012-07-26 NOTE — Progress Notes (Signed)
[name] is a 62 year old patient Dr. Lattie Haw we are seeing on nurse visited today secondary to abnormal labs. Apparently Dr. Lattie Haw was called over the weekend with lab values of sodium of 126 potassium of 4.9 and a creatinine of 2.51. Per Dr. Lattie Haw note, the patient was to stop HCTZ and potassium. The patient was no longer taking HCTZ but does continue on Lasix.  I reviewed her labs with Joelene Millin Pullium LPN today.. I will hold Lasix, potassium , and lisinopril for now. I will repeat her BMET in a week, and to have a followup appointment next week for ongoing evaluation. She has a history of hypertension and diabetes and most likely will need to be back on ACE inhibitor;however, with elevated creatinine, and potassium would like to evaluate how her labs do prior to restarting. Make further recommendations on followup. Blood pressure is 113/64. The patient is asymptomatic.

## 2012-07-26 NOTE — Progress Notes (Signed)
Subjective:     Patient ID: [name], female   DOB: 1950-07-03, 62 y.o.   MRN: [id number]  HPI  Patient is here for follow up of Diabetes Mellitus/hypothyroidism/hyperlipidemia/gout/End stage Kidney disease /.Symptoms of DM:has had no Nocturia ,deniesUrinary Frequency ,denies Blurred vision ,deniesDizziness,denies.Dysuria,deniesparesthesias, deniesextremity pain or ulcers.Marland Kitchendenieschest pain. .has hadan annual eye exam. do check the feet. doescheck CBGs. Average CBG:__175 this am______.Marland Kitchen deniesto episodes of hypoglycemia. doeshave an emergency hypoglycemic plan. admits toCompliance with medications. deniesProblems with medications.  Past Medical History  Diagnosis Date  . Chest pain 2003    ?h/o MI, syncope; 2003 stress nuclear-inferoseptal scar, EF-54%; 2007-MI ruled out, echocardiogram-normal LV size, wall thickness and function  . RLS (restless legs syndrome)     sleep study in 2010: No apnea; moderate periodic movements  . GERD (gastroesophageal reflux disease)     H/o upper GI bleed  . GI bleeding   . Obese   . Gout   . Hypothyroidism   . Low back pain   . Cellulitis     Neck  . Diabetes mellitus 1998    A1c-7.1 in 2010, 6.7 in 2013  . Hyperlipidemia     Fasting lipids 2013:158, 152, 35, 93  . Hypertension 2002  . Anemia   . Hyperkalemia   . Irregular heart beat   . PONV (postoperative nausea and vomiting)   . Myocardial infarction   . Anginal pain     last cp few months ago  . CAD (coronary artery disease)     dr. Lattie Haw ([facility])  . Shortness of breath   . Depression     Insomnia  . Chronic kidney disease, stage III (moderate)     Creat-1.9 in 04/2008, 2.65 in 02/2010(noHD  . Headache   . Cancer     HX SKIN CANCER  . Vitiligo    Past Surgical History  Procedure Laterality Date  . Appendectomy    . Carpal tunnel release  1992, 1993, 2011    Right in 2011; previously bilateral  . Tubal ligation  1976  . Partial hysterectomy  1985    Vaginal  .  Shoulder surgery  1997    Left  . Incise and drain abcess  2010    Neck-MRSA  . Colonoscopy  02/2002, 07/2009    few diverticula, otherwise negative  . Foot surgery  1984    Bilateral heel spurs  . Abdominal hysterectomy    . Shoulder surgery      RIGHT  ADJUSTED   . Av fistula placement Left 07/16/2012    Procedure: ARTERIOVENOUS (AV) FISTULA CREATION;  Surgeon: Mal Misty, MD;  Location: [place];  Service: Vascular;  Laterality: Left;   History   Social History  . Marital Status: Divorced    Spouse Name: N/A    Number of Children: 3  . Years of Education: N/A   Occupational History  . Disabled     previously Nurse, adult   Social History Main Topics  . Smoking status: Never Smoker   . Smokeless tobacco: Never Used  . Alcohol Use: No  . Drug Use: No  . Sexually Active: No   Other Topics Concern  . Not on file   Social History Narrative   No regular exercise   Family History  Problem Relation Age of Onset  . Emphysema Mother   . COPD Mother   . Colon cancer Father   . Diabetes Father   . Cancer Father     colon  .  Hypertension Father   . Congestive Heart Failure Maternal Grandmother   . Cancer Paternal Grandmother    Current Outpatient Prescriptions on File Prior to Visit  Medication Sig Dispense Refill  . allopurinol (ZYLOPRIM) 100 MG tablet Take 100 mg by mouth every morning.       Marland Kitchen amLODipine (NORVASC) 10 MG tablet Take 0.5 tablets (5 mg total) by mouth 2 (two) times daily.  60 tablet  6  . aspirin EC 81 MG tablet Take 81 mg by mouth daily.      . B-D ULTRAFINE III SHORT PEN 31G X 8 MM MISC       . carvedilol (COREG) 25 MG tablet Take 25 mg by mouth 2 (two) times daily.       . citalopram (CELEXA) 40 MG tablet Take 1 tablet (40 mg total) by mouth at bedtime.  30 tablet  0  . ergocalciferol (VITAMIN D2) 50000 UNITS capsule Take 50,000 Units by mouth once a week. Monday      . fenofibrate (TRICOR) 145 MG tablet Take 145 mg by mouth daily.      . furosemide  (LASIX) 20 MG tablet Take 10 mg by mouth 2 (two) times daily.      Marland Kitchen gabapentin (NEURONTIN) 800 MG tablet Take 1 tablet (800 mg total) by mouth 3 (three) times daily.  90 tablet  0  . glucose blood (ACCU-CHEK AVIVA PLUS) test strip Use as instructed  100 each  12  . insulin aspart (NOVOLOG FLEXPEN) 100 UNIT/ML injection Inject 40 Units into the skin 3 (three) times daily before meals.       . insulin glargine (LANTUS) 100 UNIT/ML injection Inject 41-43 Units into the skin 2 (two) times daily. 41 units in the morning;43 units in the evening      . levothyroxine (SYNTHROID, LEVOTHROID) 50 MCG tablet Take 50 mcg by mouth every evening.      Marland Kitchen lisinopril (PRINIVIL,ZESTRIL) 10 MG tablet Take 1 tablet (10 mg total) by mouth daily.  90 tablet  3  . nitroGLYCERIN (NITROSTAT) 0.4 MG SL tablet Place 1 tablet (0.4 mg total) under the tongue every 5 (five) minutes as needed for chest pain.  50 tablet  0  . omeprazole (PRILOSEC) 20 MG capsule Take 1 capsule (20 mg total) by mouth every morning.  30 capsule  2  . potassium chloride (KLOR-CON M10) 10 MEQ tablet Take 1 tablet (10 mEq total) by mouth 2 (two) times daily.  30 tablet  0  . pravastatin (PRAVACHOL) 40 MG tablet Take 1 tablet (40 mg total) by mouth at bedtime.  30 tablet  2  . rOPINIRole (REQUIP) 1 MG tablet Take 1 tablet (1 mg total) by mouth at bedtime.  30 tablet  2  . hydrochlorothiazide (HYDRODIURIL) 12.5 MG tablet Take 12.5 mg by mouth daily.      Marland Kitchen oxyCODONE-acetaminophen (ROXICET) 5-325 MG per tablet Take 1 tablet by mouth every 4 (four) hours as needed for pain.  30 tablet  0   No current facility-administered medications on file prior to visit.   Allergies  Allergen Reactions  . Latex Shortness Of Breath and Swelling    Facial swelling  . Vancomycin Hcl     REACTION: facial swelling  . Quinapril Hcl Swelling and Rash    There is no immunization history on file for this patient. Prior to Admission medications   Medication Sig Start  Date End Date Taking? Authorizing Provider  allopurinol (ZYLOPRIM) 100 MG tablet Take 100  mg by mouth every morning.    Yes Historical Provider, MD  amLODipine (NORVASC) 10 MG tablet Take 0.5 tablets (5 mg total) by mouth 2 (two) times daily. 06/30/12  Yes Fay Records, MD  aspirin EC 81 MG tablet Take 81 mg by mouth daily.   Yes Historical Provider, MD  B-D ULTRAFINE III SHORT PEN 31G X 8 MM MISC  06/05/12  Yes Historical Provider, MD  carvedilol (COREG) 25 MG tablet Take 25 mg by mouth 2 (two) times daily.  05/24/12  Yes Yehuda Savannah, MD  citalopram (CELEXA) 40 MG tablet Take 1 tablet (40 mg total) by mouth at bedtime. 07/19/12  Yes Vernie Shanks, MD  ergocalciferol (VITAMIN D2) 50000 UNITS capsule Take 50,000 Units by mouth once a week. Monday   Yes Historical Provider, MD  fenofibrate (TRICOR) 145 MG tablet Take 145 mg by mouth daily.   Yes Historical Provider, MD  furosemide (LASIX) 20 MG tablet Take 10 mg by mouth 2 (two) times daily. 05/23/12  Yes Delfina Redwood, MD  gabapentin (NEURONTIN) 800 MG tablet Take 1 tablet (800 mg total) by mouth 3 (three) times daily. 07/20/12  Yes Mary-Margaret Hassell Done, FNP  glucose blood (ACCU-CHEK AVIVA PLUS) test strip Use as instructed 07/15/12  Yes Vernie Shanks, MD  insulin aspart (NOVOLOG FLEXPEN) 100 UNIT/ML injection Inject 40 Units into the skin 3 (three) times daily before meals.    Yes Historical Provider, MD  insulin glargine (LANTUS) 100 UNIT/ML injection Inject 41-43 Units into the skin 2 (two) times daily. 41 units in the morning;43 units in the evening   Yes Historical Provider, MD  levothyroxine (SYNTHROID, LEVOTHROID) 50 MCG tablet Take 50 mcg by mouth every evening.   Yes Historical Provider, MD  lisinopril (PRINIVIL,ZESTRIL) 10 MG tablet Take 1 tablet (10 mg total) by mouth daily. 06/30/12  Yes Fay Records, MD  nitroGLYCERIN (NITROSTAT) 0.4 MG SL tablet Place 1 tablet (0.4 mg total) under the tongue every 5 (five) minutes as needed for chest  pain. 05/24/12  Yes Yehuda Savannah, MD  omeprazole (PRILOSEC) 20 MG capsule Take 1 capsule (20 mg total) by mouth every morning. 07/19/12  Yes Vernie Shanks, MD  potassium chloride (KLOR-CON M10) 10 MEQ tablet Take 1 tablet (10 mEq total) by mouth 2 (two) times daily. 07/15/12  Yes Vernie Shanks, MD  pravastatin (PRAVACHOL) 40 MG tablet Take 1 tablet (40 mg total) by mouth at bedtime. 07/19/12  Yes Vernie Shanks, MD  rOPINIRole (REQUIP) 1 MG tablet Take 1 tablet (1 mg total) by mouth at bedtime. 07/08/12  Yes Mary-Margaret Hassell Done, FNP  hydrochlorothiazide (HYDRODIURIL) 12.5 MG tablet Take 12.5 mg by mouth daily. 06/05/12   Historical Provider, MD  oxyCODONE-acetaminophen (ROXICET) 5-325 MG per tablet Take 1 tablet by mouth every 4 (four) hours as needed for pain. 07/16/12   Ulyses Amor, PA-C    Review of Systems Recent shunt placed for dialysis    Objective:   Physical Exam APPEARANCE:  Obese White female. Patient in no acute distress.The patient appeared well nourished and normally developed. Acyanotic. Waist: VITAL SIGNS:BP 115/70  Pulse 93  Temp(Src) 97.3 F (36.3 C) (Oral)  Ht 5\' 6"  (1.676 m)  Wt 224 lb (101.606 kg)  BMI 36.17 kg/m2   SKIN: warm and  Dryred psoriatic rashes of the scalp.  HEAD and Neck: without JVD, Head and scalp: normal Eyes:No scleral icterus. Fundi normal, eye movements normal. Ears: Auricle normal, canal normal, Tympanic  membranes normal, insufflation normal. Nose: normal Throat: normal Neck & thyroid: normal  CHEST & LUNGS: Chest wall: normal Lungs: Clear  CVS: Reveals the PMI to be normally located. Regular rhythm, First and Second Heart sounds are normal,  absence of murmurs, rubs or gallops. Peripheral vasculature: Radial pulses: normal Dorsal pedis pulses: normal Posterior pulses: normal  ABDOMEN:  Appearance:obese Benign,, no organomegaly, no masses, no Abdominal Aortic enlargement. No Guarding , no rebound. No Bruits. Bowel  sounds: normal  RECTAL: N/A GU: N/A  EXTREMETIES: nonedematous.  MUSCULOSKELETAL:  Left hip decreased ROM and discomfort.  NEUROLOGIC: oriented to time,place and person; nonfocal. Strength is normal    patient's CBG in office is 235 Assessment:     Psoriasis - Plan: Ambulatory referral to Dermatology  DM (diabetes mellitus) - Plan: POCT glycosylated hemoglobin (Hb A1C), POCT UA - Microalbumin, Microalbumin, urine  HTN (hypertension) - Plan: BASIC METABOLIC PANEL WITH GFR, TSH  CKD (chronic kidney disease) - Plan: BASIC METABOLIC PANEL WITH GFR, Vitamin D 25 hydroxy  Gout  HLD (hyperlipidemia) - Plan: Hepatic function panel, NMR Lipoprofile with Lipids  Hip pain, acute, left - Plan: DG Hip Complete Left  Screening for malignant neoplasm of the rectum - Plan: Fecal occult blood, imunochemical       Plan:     Use Biofreeze to the left hip. [facility] reading (PRIMARY) by  Dr.Tyteanna Ost: Preliminary: mild degenerative changes. No orders of the defined types were placed in this encounter.   Orders Placed This Encounter  Procedures  . Fecal occult blood, imunochemical  . DG Hip Complete Left    Standing Status: Future     Number of Occurrences: 1     Standing Expiration Date: 09/25/2013    Order Specific Question:  Reason for Exam (SYMPTOM  OR DIAGNOSIS REQUIRED)    Answer:  left hip pain    Order Specific Question:  Preferred imaging location?    Answer:  Internal  . BASIC METABOLIC PANEL WITH GFR  . Hepatic function panel  . NMR Lipoprofile with Lipids  . Vitamin D 25 hydroxy  . TSH  . Microalbumin, urine  . Ambulatory referral to Dermatology    Referral Priority:  Routine    Referral Type:  Consultation    Referral Reason:  Specialty Services Required    Requested Specialty:  Dermatology    Number of Visits Requested:  1  . POCT glycosylated hemoglobin (Hb A1C)  . POCT UA - Microalbumin   Appointment with the pharmacist for better management of her DM. Referral for  mammogram. Referral to dermatology.  RTc in 2 months.  Ameila Weldon P. Jacelyn Grip, M.D.

## 2012-07-26 NOTE — Telephone Encounter (Addendum)
Pt in office today for BP check 07-26-12

## 2012-07-27 ENCOUNTER — Other Ambulatory Visit: Payer: Self-pay | Admitting: *Deleted

## 2012-07-27 ENCOUNTER — Encounter: Payer: Self-pay | Admitting: Vascular Surgery

## 2012-07-27 ENCOUNTER — Encounter: Payer: Self-pay | Admitting: Adult Health

## 2012-07-27 ENCOUNTER — Telehealth: Payer: Self-pay | Admitting: *Deleted

## 2012-07-27 ENCOUNTER — Other Ambulatory Visit: Payer: Self-pay

## 2012-07-27 ENCOUNTER — Ambulatory Visit (INDEPENDENT_AMBULATORY_CARE_PROVIDER_SITE_OTHER): Payer: Medicare PPO | Admitting: Vascular Surgery

## 2012-07-27 VITALS — BP 113/65 | HR 76 | Resp 16 | Ht 66.0 in | Wt 226.0 lb

## 2012-07-27 DIAGNOSIS — N186 End stage renal disease: Secondary | ICD-10-CM

## 2012-07-27 DIAGNOSIS — I10 Essential (primary) hypertension: Secondary | ICD-10-CM

## 2012-07-27 LAB — MICROALBUMIN, URINE: Microalb, Ur: 1.48 mg/dL (ref 0.00–1.89)

## 2012-07-27 LAB — VITAMIN D 25 HYDROXY (VIT D DEFICIENCY, FRACTURES): Vit D, 25-Hydroxy: 34 ng/mL (ref 30–89)

## 2012-07-27 LAB — FECAL OCCULT BLOOD, IMMUNOCHEMICAL: Fecal Occult Blood: POSITIVE — AB

## 2012-07-27 NOTE — Progress Notes (Signed)
Subjective:     Patient ID: [name], female   DOB: 1950-03-30, 62 y.o.   MRN: [id number]  HPI this 62 year old female with end-stage renal disease is not yet a hemodialysis. She had a left radial cephalic AV fistula created on the one week ago on April 25. Today she returns to the office for followup.  Past Medical History  Diagnosis Date  . Chest pain 2003    ?h/o MI, syncope; 2003 stress nuclear-inferoseptal scar, EF-54%; 2007-MI ruled out, echocardiogram-normal LV size, wall thickness and function  . RLS (restless legs syndrome)     sleep study in 2010: No apnea; moderate periodic movements  . GERD (gastroesophageal reflux disease)     H/o upper GI bleed  . GI bleeding   . Obese   . Gout   . Hypothyroidism   . Low back pain   . Cellulitis     Neck  . Diabetes mellitus 1998    A1c-7.1 in 2010, 6.7 in 2013  . Hyperlipidemia     Fasting lipids 2013:158, 152, 35, 93  . Hypertension 2002  . Anemia   . Hyperkalemia   . Irregular heart beat   . PONV (postoperative nausea and vomiting)   . Myocardial infarction   . Anginal pain     last cp few months ago  . CAD (coronary artery disease)     dr. Lattie Haw ([facility]na [facility])  . Shortness of breath   . Depression     Insomnia  . Chronic kidney disease, stage III (moderate)     Creat-1.9 in 04/2008, 2.65 in 02/2010(noHD  . Headache   . Cancer     HX SKIN CANCER  . Vitiligo     History  Substance Use Topics  . Smoking status: Never Smoker   . Smokeless tobacco: Never Used  . Alcohol Use: No    Family History  Problem Relation Age of Onset  . Emphysema Mother   . COPD Mother   . Colon cancer Father   . Diabetes Father   . Cancer Father     colon  . Hypertension Father   . Congestive Heart Failure Maternal Grandmother   . Cancer Paternal Grandmother     Allergies  Allergen Reactions  . Latex Shortness Of Breath and Swelling    Facial swelling  . Vancomycin Hcl     REACTION: facial swelling  . Quinapril Hcl  Swelling and Rash    Current outpatient prescriptions:allopurinol (ZYLOPRIM) 100 MG tablet, Take 100 mg by mouth every morning. , Disp: , Rfl: ;  amLODipine (NORVASC) 10 MG tablet, Take 0.5 tablets (5 mg total) by mouth 2 (two) times daily., Disp: 60 tablet, Rfl: 6;  aspirin EC 81 MG tablet, Take 81 mg by mouth daily., Disp: , Rfl: ;  B-D ULTRAFINE III SHORT PEN 31G X 8 MM MISC, , Disp: , Rfl:  carvedilol (COREG) 25 MG tablet, Take 25 mg by mouth 2 (two) times daily. , Disp: , Rfl: ;  citalopram (CELEXA) 40 MG tablet, Take 1 tablet (40 mg total) by mouth at bedtime., Disp: 30 tablet, Rfl: 0;  ergocalciferol (VITAMIN D2) 50000 UNITS capsule, Take 50,000 Units by mouth once a week. Monday, Disp: , Rfl: ;  fenofibrate (TRICOR) 145 MG tablet, Take 145 mg by mouth daily., Disp: , Rfl:  gabapentin (NEURONTIN) 800 MG tablet, Take 1 tablet (800 mg total) by mouth 3 (three) times daily., Disp: 90 tablet, Rfl: 0;  glucose blood (ACCU-CHEK AVIVA PLUS) test strip, Use  as instructed, Disp: 100 each, Rfl: 12;  insulin aspart (NOVOLOG FLEXPEN) 100 UNIT/ML injection, Inject 40 Units into the skin 3 (three) times daily before meals. , Disp: , Rfl:  insulin glargine (LANTUS) 100 UNIT/ML injection, Inject 41-43 Units into the skin 2 (two) times daily. 41 units in the morning;43 units in the evening, Disp: , Rfl: ;  levothyroxine (SYNTHROID, LEVOTHROID) 50 MCG tablet, Take 50 mcg by mouth every evening., Disp: , Rfl: ;  nitroGLYCERIN (NITROSTAT) 0.4 MG SL tablet, Place 1 tablet (0.4 mg total) under the tongue every 5 (five) minutes as needed for chest pain., Disp: 50 tablet, Rfl: 0 omeprazole (PRILOSEC) 20 MG capsule, Take 1 capsule (20 mg total) by mouth every morning., Disp: 30 capsule, Rfl: 2;  oxyCODONE-acetaminophen (ROXICET) 5-325 MG per tablet, Take 1 tablet by mouth every 4 (four) hours as needed for pain., Disp: 30 tablet, Rfl: 0;  pravastatin (PRAVACHOL) 40 MG tablet, Take 1 tablet (40 mg total) by mouth at bedtime.,  Disp: 30 tablet, Rfl: 2 rOPINIRole (REQUIP) 1 MG tablet, Take 1 tablet (1 mg total) by mouth at bedtime., Disp: 30 tablet, Rfl: 2;  furosemide (LASIX) 20 MG tablet, Take 10 mg by mouth 2 (two) times daily., Disp: , Rfl: ;  lisinopril (PRINIVIL,ZESTRIL) 10 MG tablet, Take 1 tablet (10 mg total) by mouth daily., Disp: 90 tablet, Rfl: 3  BP 113/65  Pulse 76  Resp 16  Ht 5\' 6"  (1.676 m)  Wt 226 lb (102.513 kg)  BMI 36.49 kg/m2  Body mass index is 36.49 kg/(m^2).          Review of Systems denies chest pain, dyspnea on exertion, PND, orthopnea, hemoptysis    Objective:   Physical Exam blood pressure 113/65 heart rate 76 respirations 16 General obese female in no apparent distress alert and oriented x3 Lungs no rhonchi or wheezing Cardiovascular regular rhythm no murmurs Abdomen obese no palpable masses Left upper extremity with nicely healing incision in the distal forearm. No pulsatile palpable thrill and radial cephalic fistula.  Today visualize the upper arm cephalic vein with the SonoSite ultrasound it does appear to be of adequate size for brachial cephalic fistula creation    Assessment:     End-stage renal disease not on hemodialysis Failed attempt at radial cephalic AV fistula-left upper extremity    Plan:     Creation of left brachial cephalic AV fistula on Thursday, May 8 Discuss this with the patient and she would like to proceed

## 2012-07-27 NOTE — Telephone Encounter (Signed)
Request for pt to have [facility] per Dr Jeri Lager and concerns with pt medication management, sent AG with [facility] a email to set pt up with apt to have home evaluation, pt understood that someone will contact her, pending response from [employer]

## 2012-07-28 ENCOUNTER — Encounter (HOSPITAL_COMMUNITY): Payer: Self-pay | Admitting: Pharmacist

## 2012-07-28 ENCOUNTER — Encounter (HOSPITAL_COMMUNITY): Payer: Self-pay | Admitting: *Deleted

## 2012-07-28 LAB — NMR LIPOPROFILE WITH LIPIDS
Cholesterol, Total: 214 mg/dL — ABNORMAL HIGH (ref <200)
HDL Particle Number: 35.2 umol/L (ref >=30.5)
HDL Size: 8.7 nm — ABNORMAL LOW (ref >=9.2)
HDL-C: 37 mg/dL — ABNORMAL LOW (ref >=40)
LDL (calc): 72 mg/dL (ref <100)
LDL Particle Number: 1370 nmol/L — ABNORMAL HIGH (ref <1000)
LDL Size: 19.5 nm — ABNORMAL LOW (ref >20.5)
LP-IR Score: 86 — ABNORMAL HIGH (ref <=45)
Large HDL-P: <1.3 umol/L — ABNORMAL LOW (ref >=4.8)
Large VLDL-P: 29.4 nmol/L — ABNORMAL HIGH (ref <=2.7)
Small LDL Particle Number: 1017 nmol/L — ABNORMAL HIGH (ref <=527)
Triglycerides: 527 mg/dL — ABNORMAL HIGH (ref <150)
VLDL Size: 56.9 nm — ABNORMAL HIGH (ref <=46.6)

## 2012-07-28 MED ORDER — DEXTROSE 5 % IV SOLN
1.5000 g | INTRAVENOUS | Status: AC
  Administered 2012-07-29: 1.5 g via INTRAVENOUS
  Filled 2012-07-28 (×2): qty 1.5

## 2012-07-28 MED ORDER — SODIUM CHLORIDE 0.9 % IV SOLN
INTRAVENOUS | Status: DC
  Administered 2012-07-29: 10 mL/h via INTRAVENOUS

## 2012-07-29 ENCOUNTER — Ambulatory Visit (HOSPITAL_COMMUNITY): Payer: Medicare PPO | Admitting: Certified Registered Nurse Anesthetist

## 2012-07-29 ENCOUNTER — Encounter (HOSPITAL_COMMUNITY)
Admission: RE | Disposition: A | Discharge status: Home / Self Care | Payer: Self-pay | Source: Ambulatory Visit | Attending: Vascular Surgery

## 2012-07-29 ENCOUNTER — Ambulatory Visit (HOSPITAL_COMMUNITY)
Admission: RE | Admit: 2012-07-29 | Discharge: 2012-07-29 | Disposition: A | Discharge status: Home / Self Care | Payer: Medicare PPO | Source: Ambulatory Visit | Attending: Vascular Surgery | Admitting: Vascular Surgery

## 2012-07-29 ENCOUNTER — Telehealth: Payer: Self-pay | Admitting: Vascular Surgery

## 2012-07-29 ENCOUNTER — Encounter (HOSPITAL_COMMUNITY): Payer: Self-pay | Admitting: Certified Registered Nurse Anesthetist

## 2012-07-29 ENCOUNTER — Other Ambulatory Visit: Payer: Self-pay | Admitting: *Deleted

## 2012-07-29 ENCOUNTER — Encounter (HOSPITAL_COMMUNITY): Payer: Self-pay | Admitting: *Deleted

## 2012-07-29 ENCOUNTER — Encounter: Payer: Self-pay | Admitting: Vascular Surgery

## 2012-07-29 DIAGNOSIS — G2581 Restless legs syndrome: Secondary | ICD-10-CM | POA: Insufficient documentation

## 2012-07-29 DIAGNOSIS — M109 Gout, unspecified: Secondary | ICD-10-CM | POA: Insufficient documentation

## 2012-07-29 DIAGNOSIS — Z881 Allergy status to other antibiotic agents status: Secondary | ICD-10-CM | POA: Insufficient documentation

## 2012-07-29 DIAGNOSIS — Z794 Long term (current) use of insulin: Secondary | ICD-10-CM | POA: Insufficient documentation

## 2012-07-29 DIAGNOSIS — I120 Hypertensive chronic kidney disease with stage 5 chronic kidney disease or end stage renal disease: Secondary | ICD-10-CM | POA: Insufficient documentation

## 2012-07-29 DIAGNOSIS — D649 Anemia, unspecified: Secondary | ICD-10-CM | POA: Insufficient documentation

## 2012-07-29 DIAGNOSIS — Z7982 Long term (current) use of aspirin: Secondary | ICD-10-CM | POA: Insufficient documentation

## 2012-07-29 DIAGNOSIS — Z79899 Other long term (current) drug therapy: Secondary | ICD-10-CM | POA: Insufficient documentation

## 2012-07-29 DIAGNOSIS — N186 End stage renal disease: Secondary | ICD-10-CM | POA: Insufficient documentation

## 2012-07-29 DIAGNOSIS — E785 Hyperlipidemia, unspecified: Secondary | ICD-10-CM | POA: Insufficient documentation

## 2012-07-29 DIAGNOSIS — E669 Obesity, unspecified: Secondary | ICD-10-CM | POA: Insufficient documentation

## 2012-07-29 DIAGNOSIS — E119 Type 2 diabetes mellitus without complications: Secondary | ICD-10-CM | POA: Insufficient documentation

## 2012-07-29 DIAGNOSIS — Z6836 Body mass index (BMI) 36.0-36.9, adult: Secondary | ICD-10-CM | POA: Insufficient documentation

## 2012-07-29 DIAGNOSIS — K219 Gastro-esophageal reflux disease without esophagitis: Secondary | ICD-10-CM | POA: Insufficient documentation

## 2012-07-29 DIAGNOSIS — I509 Heart failure, unspecified: Secondary | ICD-10-CM | POA: Insufficient documentation

## 2012-07-29 DIAGNOSIS — Z888 Allergy status to other drugs, medicaments and biological substances status: Secondary | ICD-10-CM | POA: Insufficient documentation

## 2012-07-29 DIAGNOSIS — E039 Hypothyroidism, unspecified: Secondary | ICD-10-CM | POA: Diagnosis not present

## 2012-07-29 DIAGNOSIS — I252 Old myocardial infarction: Secondary | ICD-10-CM | POA: Insufficient documentation

## 2012-07-29 DIAGNOSIS — I2510 Atherosclerotic heart disease of native coronary artery without angina pectoris: Secondary | ICD-10-CM | POA: Insufficient documentation

## 2012-07-29 DIAGNOSIS — G4700 Insomnia, unspecified: Secondary | ICD-10-CM | POA: Insufficient documentation

## 2012-07-29 DIAGNOSIS — Z4931 Encounter for adequacy testing for hemodialysis: Secondary | ICD-10-CM

## 2012-07-29 DIAGNOSIS — Z91040 Latex allergy status: Secondary | ICD-10-CM | POA: Insufficient documentation

## 2012-07-29 HISTORY — DX: Personal history of other diseases of the digestive system: Z87.19

## 2012-07-29 HISTORY — DX: Unspecified osteoarthritis, unspecified site: M19.90

## 2012-07-29 HISTORY — PX: AV FISTULA PLACEMENT: SHX1204

## 2012-07-29 LAB — POCT I-STAT 4, (NA,K, GLUC, HGB,HCT)
Glucose, Bld: 359 mg/dL — ABNORMAL HIGH (ref 70–99)
HCT: 34 % — ABNORMAL LOW (ref 36.0–46.0)
Potassium: 4 mEq/L (ref 3.5–5.1)

## 2012-07-29 LAB — GLUCOSE, CAPILLARY: Glucose-Capillary: 246 mg/dL — ABNORMAL HIGH (ref 70–99)

## 2012-07-29 SURGERY — ARTERIOVENOUS (AV) FISTULA CREATION
Anesthesia: Monitor Anesthesia Care | Site: Arm Upper | Laterality: Left | Wound class: Clean

## 2012-07-29 MED ORDER — OXYCODONE HCL 5 MG PO TABS
10.0000 mg | ORAL_TABLET | Freq: Once | ORAL | Status: AC
  Administered 2012-07-29: 10 mg via ORAL

## 2012-07-29 MED ORDER — SODIUM CHLORIDE 0.9 % IR SOLN
Status: DC | PRN
  Administered 2012-07-29: 10:00:00

## 2012-07-29 MED ORDER — ONDANSETRON HCL 4 MG/2ML IJ SOLN: 4.0000 mg | Freq: Once | INTRAMUSCULAR | Status: DC | PRN

## 2012-07-29 MED ORDER — OXYCODONE HCL 5 MG PO TABS: 7.5000 mg | ORAL_TABLET | Freq: Four times a day (QID) | ORAL | Status: DC | PRN

## 2012-07-29 MED ORDER — LIDOCAINE-EPINEPHRINE (PF) 1 %-1:200000 IJ SOLN
INTRAMUSCULAR | Status: AC
  Filled 2012-07-29: qty 10

## 2012-07-29 MED ORDER — OXYCODONE HCL 5 MG PO TABS
ORAL_TABLET | ORAL | Status: AC
  Filled 2012-07-29: qty 2

## 2012-07-29 MED ORDER — HYDROMORPHONE HCL PF 1 MG/ML IJ SOLN: 0.2500 mg | INTRAMUSCULAR | Status: DC | PRN

## 2012-07-29 MED ORDER — PROPOFOL INFUSION 10 MG/ML OPTIME
INTRAVENOUS | Status: DC | PRN
  Administered 2012-07-29: 100 ug/kg/min via INTRAVENOUS

## 2012-07-29 MED ORDER — SODIUM CHLORIDE 0.9 % IV SOLN
INTRAVENOUS | Status: DC | PRN
  Administered 2012-07-29: 10:00:00 via INTRAVENOUS

## 2012-07-29 MED ORDER — LIDOCAINE-EPINEPHRINE (PF) 1 %-1:200000 IJ SOLN
INTRAMUSCULAR | Status: DC | PRN
  Administered 2012-07-29: 30 mL

## 2012-07-29 MED ORDER — 0.9 % SODIUM CHLORIDE (POUR BTL) OPTIME
TOPICAL | Status: DC | PRN
  Administered 2012-07-29: 1000 mL

## 2012-07-29 SURGICAL SUPPLY — 32 items
ARMBAND PINK RESTRICT EXTREMIT (MISCELLANEOUS) ×2 IMPLANT
CANISTER SUCTION 2500CC (MISCELLANEOUS) ×2 IMPLANT
CLIP TI MEDIUM 6 (CLIP) ×4 IMPLANT
CLIP TI WIDE RED SMALL 6 (CLIP) ×2 IMPLANT
CLOTH BEACON ORANGE TIMEOUT ST (SAFETY) ×2 IMPLANT
COVER PROBE W GEL 5X96 (DRAPES) IMPLANT
COVER SURGICAL LIGHT HANDLE (MISCELLANEOUS) ×2 IMPLANT
DERMABOND ADVANCED (GAUZE/BANDAGES/DRESSINGS) ×1
DERMABOND ADVANCED .7 DNX12 (GAUZE/BANDAGES/DRESSINGS) ×1 IMPLANT
ELECT REM PT RETURN 9FT ADLT (ELECTROSURGICAL) ×2
ELECTRODE REM PT RTRN 9FT ADLT (ELECTROSURGICAL) ×1 IMPLANT
GEL ULTRASOUND 20GR AQUASONIC (MISCELLANEOUS) IMPLANT
GLOVE BIOGEL PI IND STRL 6.5 (GLOVE) ×1 IMPLANT
GLOVE BIOGEL PI INDICATOR 6.5 (GLOVE) ×1
GLOVE SS BIOGEL STRL SZ 7 (GLOVE) IMPLANT
GLOVE SUPERSENSE BIOGEL SZ 7 (GLOVE)
GLOVE SURG SS PI 6.5 STRL IVOR (GLOVE) ×4 IMPLANT
GLOVE SURG SS PI 7.0 STRL IVOR (GLOVE) ×6 IMPLANT
GOWN STRL NON-REIN LRG LVL3 (GOWN DISPOSABLE) ×4 IMPLANT
KIT BASIN OR (CUSTOM PROCEDURE TRAY) ×2 IMPLANT
KIT ROOM TURNOVER OR (KITS) ×2 IMPLANT
NS IRRIG 1000ML POUR BTL (IV SOLUTION) ×2 IMPLANT
PACK CV ACCESS (CUSTOM PROCEDURE TRAY) ×2 IMPLANT
PAD ARMBOARD 7.5X6 YLW CONV (MISCELLANEOUS) ×4 IMPLANT
SPONGE GAUZE 4X4 12PLY (GAUZE/BANDAGES/DRESSINGS) ×2 IMPLANT
SUT PROLENE 6 0 BV (SUTURE) ×2 IMPLANT
SUT VIC AB 3-0 SH 27 (SUTURE) ×1
SUT VIC AB 3-0 SH 27X BRD (SUTURE) ×1 IMPLANT
TOWEL OR 17X24 6PK STRL BLUE (TOWEL DISPOSABLE) ×2 IMPLANT
TOWEL OR 17X26 10 PK STRL BLUE (TOWEL DISPOSABLE) ×2 IMPLANT
UNDERPAD 30X30 INCONTINENT (UNDERPADS AND DIAPERS) ×2 IMPLANT
WATER STERILE IRR 1000ML POUR (IV SOLUTION) ×2 IMPLANT

## 2012-07-29 NOTE — Progress Notes (Signed)
Dr.Smith notified of blood glucose 359.  No orders received.

## 2012-07-29 NOTE — Interval H&P Note (Signed)
History and Physical Interval Note:  07/29/2012 9:46 AM  [name]  has presented today for surgery, with the diagnosis of End Stage Renal Disease  The various methods of treatment have been discussed with the patient and family. After consideration of risks, benefits and other options for treatment, the patient has consented to  Procedure(s) with comments: ARTERIOVENOUS (AV) FISTULA CREATION (Left) - Left Brachial-Cephalic Arteriovenous Fistula Creation as a surgical intervention .  The patient's history has been reviewed, patient examined, no change in status, stable for surgery.  I have reviewed the patient's chart and labs.  Questions were answered to the patient's satisfaction.     Tinnie Gens

## 2012-07-29 NOTE — Anesthesia Postprocedure Evaluation (Signed)
  Anesthesia Post-op Note  Patient: [name]  Procedure(s) Performed: Procedure(s) with comments: ARTERIOVENOUS (AV) FISTULA CREATION (Left) - Left Brachial-Cephalic Arteriovenous Fistula Creation  Patient Location: PACU  Anesthesia Type:MAC  Level of Consciousness: awake, alert , oriented and patient cooperative  Airway and Oxygen Therapy: Patient Spontanous Breathing  Post-op Pain: mild  Post-op Assessment: Post-op Vital signs reviewed, Patient's Cardiovascular Status Stable, Respiratory Function Stable, Patent Airway, No signs of Nausea or vomiting and Pain level controlled  Post-op Vital Signs: stable  Complications: No apparent anesthesia complications

## 2012-07-29 NOTE — Preoperative (Signed)
Beta Blockers   Reason not to administer Beta Blockers:Not Applicable, Pt took Coreg at 0500 today

## 2012-07-29 NOTE — Telephone Encounter (Signed)
notified patient of fu appt with dr. Kellie Simmering on 09-14-12 1pm

## 2012-07-29 NOTE — Transfer of Care (Signed)
Immediate Anesthesia Transfer of Care Note  Patient: [name]  Procedure(s) Performed: Procedure(s) with comments: ARTERIOVENOUS (AV) FISTULA CREATION (Left) - Left Brachial-Cephalic Arteriovenous Fistula Creation  Patient Location: PACU  Anesthesia Type:MAC  Level of Consciousness: awake, alert , oriented and patient cooperative  Airway & Oxygen Therapy: Patient Spontanous Breathing and Patient connected to nasal cannula oxygen  Post-op Assessment: Report given to PACU RN, Post -op Vital signs reviewed and stable and Patient moving all extremities X 4  Post vital signs: Reviewed and stable  Complications: No apparent anesthesia complications

## 2012-07-29 NOTE — Anesthesia Preprocedure Evaluation (Addendum)
Anesthesia Evaluation  Patient identified by MRN, date of birth, ID band Patient awake    Reviewed: Allergy & Precautions, H&P , NPO status , Patient's Chart, lab work & pertinent test results  History of Anesthesia Complications (+) PONV  Airway Mallampati: I TM Distance: >3 FB Neck ROM: full    Dental  (+) Edentulous Upper and Edentulous Lower   Pulmonary          Cardiovascular hypertension, Pt. on medications and Pt. on home beta blockers + angina + CAD, + Past MI and + Peripheral Vascular Disease Rhythm:regular Rate:Normal     Neuro/Psych  Headaches, PSYCHIATRIC DISORDERS Depression    GI/Hepatic hiatal hernia, GERD-  Medicated and Controlled,  Endo/Other  diabetes, Type 2, Insulin DependentHypothyroidism   Renal/GU ESRFRenal disease     Musculoskeletal   Abdominal   Peds  Hematology  (+) anemia ,   Anesthesia Other Findings   Reproductive/Obstetrics                          Anesthesia Physical Anesthesia Plan  ASA: III  Anesthesia Plan: MAC   Post-op Pain Management:    Induction: Intravenous  Airway Management Planned: Simple Face Mask  Additional Equipment:   Intra-op Plan:   Post-operative Plan:   Informed Consent: I have reviewed the patients History and Physical, chart, labs and discussed the procedure including the risks, benefits and alternatives for the proposed anesthesia with the patient or authorized representative who has indicated his/her understanding and acceptance.     Plan Discussed with: CRNA, Anesthesiologist and Surgeon  Anesthesia Plan Comments:         Anesthesia Quick Evaluation

## 2012-07-29 NOTE — Op Note (Signed)
OPERATIVE REPORT  Date of Surgery: 07/29/2012  Surgeon: Tinnie Gens, MD  Assistant: Dionicio Stall  Pre-op Diagnosis: End Stage Renal Disease  Post-op Diagnosis: End Stage Renal Disease  Procedure: Procedure(s): ARTERIOVENOUS (AV) FISTULA CREATION-left brachial to cephalic  Anesthesia: MAC  EBL: Minimal  Complications: None  Procedure Details: Patient was taken to the operating room placed in supine position at which time the left upper extremity was prepped with Betadine scrub and solution and draped in routine sterile manner. After infiltration with 1% Xylocaine with epinephrine a transverse incision was made in the antecubital area. Antecubital vein dissected free. Cephalic branch in the upper arm was quite satisfactory being about 3 mm in size. Branches were ligated with 3 and 4-0 silk ties and divided it was gently dilated with heparinized saline after ligating it distally and transecting it. Brachial artery was exposed and the fascia encircled with vessel loops. An excellent pulse and was a 3 mm vessel. Artery was occluded proximally and distally with a 15 blade extended with Potts scissors. The vein was carefully measured spatulated and anastomosed end to side with 6-0 Prolene. Vessel loops were then released and there was a good pulse and palpable thrill at the fistula site with good flow up the cephalic vein to the proximal upper arm. The radial and ulnar flow were unaffected by the fistula. Adequate hemostasis was achieved and the wound was closed in layers with Vicryl in a subcuticular fashion with Dermabond the patient taken to the recovery room in stable condition   Tinnie Gens, MD 07/29/2012 11:26 AM

## 2012-07-29 NOTE — H&P (View-Only) (Signed)
Subjective:     Patient ID: [name], female   DOB: 1951/01/17, 62 y.o.   MRN: [id number]  HPI this 62 year old female with end-stage renal disease is not yet a hemodialysis. She had a left radial cephalic AV fistula created on the one week ago on April 25. Today she returns to the office for followup.  Past Medical History  Diagnosis Date  . Chest pain 2003    ?h/o MI, syncope; 2003 stress nuclear-inferoseptal scar, EF-54%; 2007-MI ruled out, echocardiogram-normal LV size, wall thickness and function  . RLS (restless legs syndrome)     sleep study in 2010: No apnea; moderate periodic movements  . GERD (gastroesophageal reflux disease)     H/o upper GI bleed  . GI bleeding   . Obese   . Gout   . Hypothyroidism   . Low back pain   . Cellulitis     Neck  . Diabetes mellitus 1998    A1c-7.1 in 2010, 6.7 in 2013  . Hyperlipidemia     Fasting lipids 2013:158, 152, 35, 93  . Hypertension 2002  . Anemia   . Hyperkalemia   . Irregular heart beat   . PONV (postoperative nausea and vomiting)   . Myocardial infarction   . Anginal pain     last cp few months ago  . CAD (coronary artery disease)     dr. Lattie Haw (Linna Hoff)  . Shortness of breath   . Depression     Insomnia  . Chronic kidney disease, stage III (moderate)     Creat-1.9 in 04/2008, 2.65 in 02/2010(noHD  . Headache   . Cancer     HX SKIN CANCER  . Vitiligo     History  Substance Use Topics  . Smoking status: Never Smoker   . Smokeless tobacco: Never Used  . Alcohol Use: No    Family History  Problem Relation Age of Onset  . Emphysema Mother   . COPD Mother   . Colon cancer Father   . Diabetes Father   . Cancer Father     colon  . Hypertension Father   . Congestive Heart Failure Maternal Grandmother   . Cancer Paternal Grandmother     Allergies  Allergen Reactions  . Latex Shortness Of Breath and Swelling    Facial swelling  . Vancomycin Hcl     REACTION: facial swelling  . Quinapril Hcl  Swelling and Rash    Current outpatient prescriptions:allopurinol (ZYLOPRIM) 100 MG tablet, Take 100 mg by mouth every morning. , Disp: , Rfl: ;  amLODipine (NORVASC) 10 MG tablet, Take 0.5 tablets (5 mg total) by mouth 2 (two) times daily., Disp: 60 tablet, Rfl: 6;  aspirin EC 81 MG tablet, Take 81 mg by mouth daily., Disp: , Rfl: ;  B-D ULTRAFINE III SHORT PEN 31G X 8 MM MISC, , Disp: , Rfl:  carvedilol (COREG) 25 MG tablet, Take 25 mg by mouth 2 (two) times daily. , Disp: , Rfl: ;  citalopram (CELEXA) 40 MG tablet, Take 1 tablet (40 mg total) by mouth at bedtime., Disp: 30 tablet, Rfl: 0;  ergocalciferol (VITAMIN D2) 50000 UNITS capsule, Take 50,000 Units by mouth once a week. Monday, Disp: , Rfl: ;  fenofibrate (TRICOR) 145 MG tablet, Take 145 mg by mouth daily., Disp: , Rfl:  gabapentin (NEURONTIN) 800 MG tablet, Take 1 tablet (800 mg total) by mouth 3 (three) times daily., Disp: 90 tablet, Rfl: 0;  glucose blood (ACCU-CHEK AVIVA PLUS) test strip, Use  as instructed, Disp: 100 each, Rfl: 12;  insulin aspart (NOVOLOG FLEXPEN) 100 UNIT/ML injection, Inject 40 Units into the skin 3 (three) times daily before meals. , Disp: , Rfl:  insulin glargine (LANTUS) 100 UNIT/ML injection, Inject 41-43 Units into the skin 2 (two) times daily. 41 units in the morning;43 units in the evening, Disp: , Rfl: ;  levothyroxine (SYNTHROID, LEVOTHROID) 50 MCG tablet, Take 50 mcg by mouth every evening., Disp: , Rfl: ;  nitroGLYCERIN (NITROSTAT) 0.4 MG SL tablet, Place 1 tablet (0.4 mg total) under the tongue every 5 (five) minutes as needed for chest pain., Disp: 50 tablet, Rfl: 0 omeprazole (PRILOSEC) 20 MG capsule, Take 1 capsule (20 mg total) by mouth every morning., Disp: 30 capsule, Rfl: 2;  oxyCODONE-acetaminophen (ROXICET) 5-325 MG per tablet, Take 1 tablet by mouth every 4 (four) hours as needed for pain., Disp: 30 tablet, Rfl: 0;  pravastatin (PRAVACHOL) 40 MG tablet, Take 1 tablet (40 mg total) by mouth at bedtime.,  Disp: 30 tablet, Rfl: 2 rOPINIRole (REQUIP) 1 MG tablet, Take 1 tablet (1 mg total) by mouth at bedtime., Disp: 30 tablet, Rfl: 2;  furosemide (LASIX) 20 MG tablet, Take 10 mg by mouth 2 (two) times daily., Disp: , Rfl: ;  lisinopril (PRINIVIL,ZESTRIL) 10 MG tablet, Take 1 tablet (10 mg total) by mouth daily., Disp: 90 tablet, Rfl: 3  BP 113/65  Pulse 76  Resp 16  Ht 5\' 6"  (1.676 m)  Wt 226 lb (102.513 kg)  BMI 36.49 kg/m2  Body mass index is 36.49 kg/(m^2).          Review of Systems denies chest pain, dyspnea on exertion, PND, orthopnea, hemoptysis    Objective:   Physical Exam blood pressure 113/65 heart rate 76 respirations 16 General obese female in no apparent distress alert and oriented x3 Lungs no rhonchi or wheezing Cardiovascular regular rhythm no murmurs Abdomen obese no palpable masses Left upper extremity with nicely healing incision in the distal forearm. No pulsatile palpable thrill and radial cephalic fistula.  Today visualize the upper arm cephalic vein with the SonoSite ultrasound it does appear to be of adequate size for brachial cephalic fistula creation    Assessment:     End-stage renal disease not on hemodialysis Failed attempt at radial cephalic AV fistula-left upper extremity    Plan:     Creation of left brachial cephalic AV fistula on Thursday, May 8 Discuss this with the patient and she would like to proceed

## 2012-07-30 ENCOUNTER — Encounter (HOSPITAL_COMMUNITY): Payer: Self-pay | Admitting: Vascular Surgery

## 2012-07-30 NOTE — Telephone Encounter (Signed)
Received incoming call from [place] with [facility] to advise she is to call pt today to schedule evaluation, will call back to advise if contact was made

## 2012-08-01 ENCOUNTER — Other Ambulatory Visit: Payer: Self-pay | Admitting: Family Medicine

## 2012-08-01 DIAGNOSIS — R899 Unspecified abnormal finding in specimens from other organs, systems and tissues: Secondary | ICD-10-CM

## 2012-08-01 NOTE — Progress Notes (Signed)
Quick Note:  Labs abnormal. Fecal occult blood positive. Needs to see GI for evaluation. Referral made in [facility]IC.  ______

## 2012-08-02 ENCOUNTER — Telehealth: Payer: Self-pay | Admitting: *Deleted

## 2012-08-02 ENCOUNTER — Telehealth: Payer: Self-pay | Admitting: Adult Health

## 2012-08-02 NOTE — Telephone Encounter (Signed)
Patient would like return phone call regarding lab work / tgs

## 2012-08-02 NOTE — Telephone Encounter (Signed)
Reviewed, thanks!

## 2012-08-02 NOTE — Telephone Encounter (Signed)
FYI: pt called back to advise that her recent Hemoccult noted blood in her stool during her recent labs and was advised she is being set up for a GI apt per PCP and wanted to inform her Cardiologist, pt has f/u apt tomorrow with [facility] and voiced understanding to re-start her lasix and potassium as advised verbally from [facility] per recent surgery noted on 07-29-12 due to end stage kidney,  pt was asked about vm left from [facility] recently to set up home evaluation and pt stated "I am not sure they left a message, sometimes my phone plays tricks on me"

## 2012-08-02 NOTE — Telephone Encounter (Signed)
Spoke to pt to advise results/instructions. Pt understood. Pt will keep f/u tomorrow

## 2012-08-02 NOTE — Telephone Encounter (Signed)
While reviewing pt chart for tomorrow upcoming follow up, noted pt had recent surgery of AV fistula creation on 07-29-12, received verbal orders from [facility] NP to call pt to advised to restart her Lasix and potassium,left vm for pt to call office

## 2012-08-03 ENCOUNTER — Encounter: Payer: Self-pay | Admitting: Adult Health

## 2012-08-03 ENCOUNTER — Encounter: Payer: Self-pay | Admitting: *Deleted

## 2012-08-03 ENCOUNTER — Ambulatory Visit (INDEPENDENT_AMBULATORY_CARE_PROVIDER_SITE_OTHER): Payer: Medicare PPO | Admitting: Adult Health

## 2012-08-03 VITALS — BP 123/64 | HR 79 | Ht 66.0 in | Wt 230.0 lb

## 2012-08-03 DIAGNOSIS — R079 Chest pain, unspecified: Secondary | ICD-10-CM

## 2012-08-03 DIAGNOSIS — I10 Essential (primary) hypertension: Secondary | ICD-10-CM

## 2012-08-03 MED ORDER — LISINOPRIL 2.5 MG PO TABS: 2.5000 mg | ORAL_TABLET | Freq: Every day | ORAL | Status: DC

## 2012-08-03 NOTE — Assessment & Plan Note (Signed)
She denies chest pain or dyspnea. Continue current medication regimen.

## 2012-08-03 NOTE — Progress Notes (Signed)
HPI: [name] is a 62 year old patient of Dr. Jacqulyn Ducking we are seeing for ongoing assessment and treatment of difficult to control hypertension with history of diabetes. The patient had a follow up blood pressure check on 07/26/2012, and normal lab values with sodium 126 potassium 4.9 creatinine 2.51. The patient has since had AV fistula placed in preparation for possible dialysis on 07/29/2012. The patient's blood pressure medication was adjusted, to include stopping of Lasix, potassium and lisinopril due to elevated creatinine of 2.51. Followup labs were ordered. But did not include creatinine status. Close followup for blood pressure check and status today.  She has since started on lasix 20 mg daily. She complains of generalized fatigue and headache. She is concerned about her ability to provide care for two elderly people in her home with her multiple medical issues.   Allergies  Allergen Reactions  . Latex Shortness Of Breath and Swelling    Facial swelling  . Vancomycin Hcl Swelling    REACTION: facial swelling  . Quinapril Hcl Swelling and Rash    Arm swelling    Current Outpatient Prescriptions  Medication Sig Dispense Refill  . allopurinol (ZYLOPRIM) 100 MG tablet Take 100 mg by mouth every morning.       Marland Kitchen amLODipine (NORVASC) 10 MG tablet Take 0.5 tablets (5 mg total) by mouth 2 (two) times daily.  60 tablet  6  . aspirin EC 81 MG tablet Take 81 mg by mouth every morning.       . B-D ULTRAFINE III SHORT PEN 31G X 8 MM MISC       . carvedilol (COREG) 25 MG tablet Take 25 mg by mouth 2 (two) times daily.       . citalopram (CELEXA) 40 MG tablet Take 1 tablet (40 mg total) by mouth at bedtime.  30 tablet  0  . ergocalciferol (VITAMIN D2) 50000 UNITS capsule Take 50,000 Units by mouth once a week. Monday      . fenofibrate (TRICOR) 145 MG tablet Take 145 mg by mouth every morning.       . furosemide (LASIX) 20 MG tablet Take 20 mg by mouth daily.       Marland Kitchen gabapentin  (NEURONTIN) 800 MG tablet Take 2,400 mg by mouth at bedtime.      Marland Kitchen glucose blood (ACCU-CHEK AVIVA PLUS) test strip Use as instructed  100 each  12  . insulin aspart (NOVOLOG FLEXPEN) 100 UNIT/ML injection Inject 40 Units into the skin 3 (three) times daily before meals.       . Insulin Glargine (LANTUS SOLOSTAR) 100 UNIT/ML SOPN Inject 41-43 Units into the skin 2 (two) times daily. 41 units in the morning and 43 units at night      . KLOR-CON M10 10 MEQ tablet Take 10 mEq by mouth daily.       Marland Kitchen levothyroxine (SYNTHROID, LEVOTHROID) 50 MCG tablet Take 50 mcg by mouth every evening.      . nitroGLYCERIN (NITROSTAT) 0.4 MG SL tablet Place 1 tablet (0.4 mg total) under the tongue every 5 (five) minutes as needed for chest pain.  50 tablet  0  . omeprazole (PRILOSEC) 20 MG capsule Take 1 capsule (20 mg total) by mouth every morning.  30 capsule  2  . oxyCODONE (ROXICODONE) 5 MG immediate release tablet Take 1.5 tablets (7.5 mg total) by mouth every 6 (six) hours as needed for pain.  30 tablet  0  . pravastatin (PRAVACHOL) 40 MG tablet Take  1 tablet (40 mg total) by mouth at bedtime.  30 tablet  2  . rOPINIRole (REQUIP) 1 MG tablet Take 1 tablet (1 mg total) by mouth at bedtime.  30 tablet  2  . lisinopril (PRINIVIL,ZESTRIL) 2.5 MG tablet Take 1 tablet (2.5 mg total) by mouth daily.  90 tablet  3   No current facility-administered medications for this visit.    Past Medical History  Diagnosis Date  . Chest pain 2003    ?h/o MI, syncope; 2003 stress nuclear-inferoseptal scar, EF-54%; 2007-MI ruled out, echocardiogram-normal LV size, wall thickness and function  . RLS (restless legs syndrome)     sleep study in 2010: No apnea; moderate periodic movements  . GERD (gastroesophageal reflux disease)     H/o upper GI bleed  . GI bleeding   . Obese   . Gout   . Hypothyroidism   . Low back pain   . Cellulitis     Neck  . Diabetes mellitus 1998    A1c-7.1 in 2010, 6.7 in 2013  . Hyperlipidemia      Fasting lipids 2013:158, 152, 35, 93  . Hypertension 2002  . Anemia   . Hyperkalemia   . Irregular heart beat   . PONV (postoperative nausea and vomiting)   . Myocardial infarction   . Anginal pain     last cp few months ago  . CAD (coronary artery disease)     dr. Lattie Haw ([facility])  . Depression     Insomnia  . Chronic kidney disease, stage III (moderate)     Creat-1.9 in 04/2008, 2.65 in 02/2010(noHD  . Headache   . Vitiligo   . H/O hiatal hernia   . Cancer     HX SKIN CANCER  . Arthritis     Past Surgical History  Procedure Laterality Date  . Appendectomy    . Carpal tunnel release  1992, 1993, 2011    Right in 2011; previously bilateral  . Tubal ligation  1976  . Partial hysterectomy  1985    Vaginal  . Shoulder surgery  1997    Left  . Incise and drain abcess  2010    Neck-MRSA  . Colonoscopy  02/2002, 07/2009    few diverticula, otherwise negative  . Foot surgery  1984    Bilateral heel spurs  . Abdominal hysterectomy    . Shoulder surgery      RIGHT  ADJUSTED   . Av fistula placement Left 07/16/2012    Procedure: ARTERIOVENOUS (AV) FISTULA CREATION;  Surgeon: Mal Misty, MD;  Location: [place];  Service: Vascular;  Laterality: Left;  . Av fistula placement Left 07/29/2012    Procedure: ARTERIOVENOUS (AV) FISTULA CREATION;  Surgeon: Mal Misty, MD;  [facility];  Service: Vascular;  Laterality: Left;  Left Brachial-Cephalic Arteriovenous Fistula Creation    ROS: NROS  PHYSICAL EXAM BP 123/64  Pulse 79  Ht 5\' 6"  (1.676 m)  Wt 230 lb (104.327 kg)  BMI 37.14 kg/m2  SpO2 97%  General: Well developed, well nourished, in no acute distress, obese. Head: Eyes PERRLA, No xanthomas.   Normal cephalic and atramatic  Lungs: Clear bilaterally to auscultation and percussion. Heart: HRRR S1 S2, without MRG.  Pulses are 2+ & equal.            No carotid bruit. No JVD.  No abdominal bruits. No femoral bruits. Abdomen: Bowel sounds are positive, abdomen  soft and non-tender without masses or  Hernia's noted. Msk:  Back normal, normal gait. Normal strength and tone for age. Extremities: No clubbing, cyanosis or edema.  DP +1 Neuro: Alert and oriented X 3. Psych:  Flat affect, responds appropriately    ASSESSMENT AND PLAN

## 2012-08-03 NOTE — Telephone Encounter (Signed)
Pt in office today,advised that she will call the [facility] rep number given to her today at [place]

## 2012-08-03 NOTE — Assessment & Plan Note (Signed)
Blood pressure is well controlled currently. Will add back low dose lisinopril 2.5 mg as she is a diabetic. Repeat labs demonstrate potassium 4.5, Creatinine 2.06. Sodium 134. Will continue to hold potassium. She will follow with Dr. Hinda Lenis for labs.  Letter is provided for release from caring for elderly patients with multiple medical issues.

## 2012-08-03 NOTE — Progress Notes (Signed)
Name: [name]    DOB: 1950/08/16  Age: 62 y.o.  MR#: [id number]       PCP:  Anthoney Harada, MD      Insurance: Payor: [employer] MEDICARE  Plan: [employer] MEDICARE CHOICE PPO  Product Type: *No Product type*    CC:    Chief Complaint  Patient presents with  . Hypertension    VS Filed Vitals:   08/03/12 1314  BP: 123/64  Pulse: 79  Height: 5\' 6"  (1.676 m)  Weight: 230 lb (104.327 kg)  SpO2: 97%    Weights Current Weight  08/03/12 230 lb (104.327 kg)  07/29/12 225 lb 5 oz (102.2 kg)  07/29/12 225 lb 5 oz (102.2 kg)    Blood Pressure  BP Readings from Last 3 Encounters:  08/03/12 123/64  07/29/12 113/58  07/29/12 113/58     Admit date:  (Not on file) Last encounter with RMR:  08/02/2012   Allergy Latex; Vancomycin hcl; and Quinapril hcl  Current Outpatient Prescriptions  Medication Sig Dispense Refill  . allopurinol (ZYLOPRIM) 100 MG tablet Take 100 mg by mouth every morning.       Marland Kitchen amLODipine (NORVASC) 10 MG tablet Take 0.5 tablets (5 mg total) by mouth 2 (two) times daily.  60 tablet  6  . aspirin EC 81 MG tablet Take 81 mg by mouth every morning.       . B-D ULTRAFINE III SHORT PEN 31G X 8 MM MISC       . carvedilol (COREG) 25 MG tablet Take 25 mg by mouth 2 (two) times daily.       . citalopram (CELEXA) 40 MG tablet Take 1 tablet (40 mg total) by mouth at bedtime.  30 tablet  0  . ergocalciferol (VITAMIN D2) 50000 UNITS capsule Take 50,000 Units by mouth once a week. Monday      . fenofibrate (TRICOR) 145 MG tablet Take 145 mg by mouth every morning.       . furosemide (LASIX) 20 MG tablet Take 20 mg by mouth daily.       Marland Kitchen gabapentin (NEURONTIN) 800 MG tablet Take 2,400 mg by mouth at bedtime.      Marland Kitchen glucose blood (ACCU-CHEK AVIVA PLUS) test strip Use as instructed  100 each  12  . insulin aspart (NOVOLOG FLEXPEN) 100 UNIT/ML injection Inject 40 Units into the skin 3 (three) times daily before meals.       . Insulin Glargine (LANTUS SOLOSTAR) 100 UNIT/ML SOPN  Inject 41-43 Units into the skin 2 (two) times daily. 41 units in the morning and 43 units at night      . KLOR-CON M10 10 MEQ tablet Take 10 mEq by mouth daily.       Marland Kitchen levothyroxine (SYNTHROID, LEVOTHROID) 50 MCG tablet Take 50 mcg by mouth every evening.      . nitroGLYCERIN (NITROSTAT) 0.4 MG SL tablet Place 1 tablet (0.4 mg total) under the tongue every 5 (five) minutes as needed for chest pain.  50 tablet  0  . omeprazole (PRILOSEC) 20 MG capsule Take 1 capsule (20 mg total) by mouth every morning.  30 capsule  2  . oxyCODONE (ROXICODONE) 5 MG immediate release tablet Take 1.5 tablets (7.5 mg total) by mouth every 6 (six) hours as needed for pain.  30 tablet  0  . pravastatin (PRAVACHOL) 40 MG tablet Take 1 tablet (40 mg total) by mouth at bedtime.  30 tablet  2  . rOPINIRole (REQUIP) 1  MG tablet Take 1 tablet (1 mg total) by mouth at bedtime.  30 tablet  2   No current facility-administered medications for this visit.    Discontinued Meds:   There are no discontinued medications.  Patient Active Problem List   Diagnosis Date Noted  . Psoriasis 07/26/2012  . DM type 2, uncontrolled, with neuropathy 04/29/2012  . Chest pain   . RLS (restless legs syndrome)   . Chronic kidney disease, stage III (moderate)   . Gout 11/22/2009  . Anemia, normocytic normochromic 07/24/2009  . Hypothyroidism 01/22/2009  . Hyperlipidemia 01/22/2009  . Obesity 01/22/2009  . Hypertension 01/22/2009  . Gastroesophageal reflux disease 01/22/2009    LABS    Component Value Date/Time   NA 137 07/29/2012 0648   NA 133* 07/26/2012 1140   NA 126* 07/22/2012 1025   K 4.0 07/29/2012 0648   K 4.7 07/26/2012 1140   K 4.9 07/22/2012 1025   CL 97 07/26/2012 1140   CL 92* 07/22/2012 1025   CL 102 05/23/2012 0608   CO2 23 07/26/2012 1140   CO2 21 07/22/2012 1025   CO2 27 05/23/2012 0608   GLUCOSE 359* 07/29/2012 0648   GLUCOSE 227* 07/26/2012 1140   GLUCOSE 761* 07/22/2012 1025   BUN 44* 07/26/2012 1140   BUN 43* 07/22/2012 1025    BUN 20 05/23/2012 0608   CREATININE 2.54* 07/26/2012 1140   CREATININE 2.51* 07/22/2012 1025   CREATININE 1.52* 05/23/2012 0608   CREATININE 1.58* 05/22/2012 0510   CREATININE 1.80* 05/21/2012 1517   CREATININE 1.81* 05/15/2008 1046   CALCIUM 9.5 07/26/2012 1140   CALCIUM 8.9 07/22/2012 1025   CALCIUM 8.6 05/23/2012 0608   GFRNONAA 36* 05/23/2012 0608   GFRNONAA 34* 05/22/2012 0510   GFRNONAA 29* 05/21/2012 1517   GFRAA 41* 05/23/2012 0608   GFRAA 39* 05/22/2012 0510   GFRAA 34* 05/21/2012 1517   CMP     Component Value Date/Time   NA 137 07/29/2012 0648   K 4.0 07/29/2012 0648   CL 97 07/26/2012 1140   CO2 23 07/26/2012 1140   GLUCOSE 359* 07/29/2012 0648   BUN 44* 07/26/2012 1140   CREATININE 2.54* 07/26/2012 1140   CREATININE 1.52* 05/23/2012 0608   CREATININE 1.81* 05/15/2008 1046   CALCIUM 9.5 07/26/2012 1140   PROT 7.9 07/26/2012 1140   ALBUMIN 4.1 07/26/2012 1140   AST 14 07/26/2012 1140   ALT 14 07/26/2012 1140   ALKPHOS 93 07/26/2012 1140   BILITOT 0.5 07/26/2012 1140   GFRNONAA 36* 05/23/2012 0608   GFRAA 41* 05/23/2012 0608       Component Value Date/Time   WBC 8.8 05/22/2012 0510   WBC 10.3 05/21/2012 1517   WBC 9.2 03/03/2010 2110   HGB 11.6* 07/29/2012 0648   HGB 11.6* 07/16/2012 0703   HGB 12.0 05/22/2012 0510   HCT 34.0* 07/29/2012 0648   HCT 34.0* 07/16/2012 0703   HCT 35.5* 05/22/2012 0510   MCV 82.6 05/22/2012 0510   MCV 82.3 05/21/2012 1517   MCV 85.8 03/03/2010 2110    Lipid Panel     Component Value Date/Time   CHOL 149 01/22/2009 0000   TRIG 527* 07/26/2012 1140   TRIG 287 01/22/2009 0000   HDL 21 01/22/2009 0000   CHOLHDL 7.1 05/15/2008 0513   VLDL 57* 05/15/2008 0513   LDLCALC 72 07/26/2012 1140   LDLCALC 71 01/22/2009 0000    ABG    Component Value Date/Time   PHART 7.398 03/03/2010 2120  PCO2ART 37.8 03/03/2010 2120   PO2ART 74.9* 03/03/2010 2120   HCO3 22.8 03/03/2010 2120   TCO2 20.8 03/03/2010 2120   ACIDBASEDEF 1.3 03/03/2010 2120   O2SAT 94.5 03/03/2010 2120     Lab Results  Component Value  Date   TSH 3.055 07/26/2012   BNP (last 3 results) No results found for this basename: PROBNP,  in the last 8760 hours Cardiac Panel (last 3 results) No results found for this basename: CKTOTAL, CKMB, TROPONINI, RELINDX,  in the last 72 hours  Iron/TIBC/Ferritin    Component Value Date/Time   IRON 75 08/03/2009 1955   TIBC 306 08/03/2009 1955   FERRITIN 169 08/03/2009 1955     EKG Orders placed in visit on 07/01/12  . EKG 12-LEAD     Prior Assessment and Plan Problem List as of 08/03/2012     ICD-9-CM     Cardiology Problems   Hyperlipidemia   Last Assessment & Plan   05/24/2012 Office Visit Written 05/24/2012  3:28 PM by Yehuda Savannah, MD     Recent lipid profile excellent with current therapy, which will be continued.    Hypertension   Last Assessment & Plan   05/24/2012 Office Visit Written 05/24/2012  3:28 PM by Yehuda Savannah, MD     Blood pressure control is improved with increased dose of carvedilol despite discontinuation of thiazide diuretic. We will further increase dose to 25 mg twice a day and add lisinopril 5 mg per day. Patient will monitor home blood pressures and return for a nursing visit in one month.      Other   Hypothyroidism   Gout   Obesity   Anemia, normocytic normochromic   Last Assessment & Plan   05/24/2012 Office Visit Written 05/24/2012  2:37 PM by Yehuda Savannah, MD     Borderline anemia on most recent assessment earlier this month.    Gastroesophageal reflux disease   Chest pain   Last Assessment & Plan   05/24/2012 Office Visit Written 05/24/2012  3:26 PM by Yehuda Savannah, MD     Chest discomfort continues without documentation of myocardial ischemia. I've advised her to fill her prescription for nitroglycerin and to use it as needed in the way of an empiric trial. I suspect she will ultimately require coronary angiography to definitively diagnose or exclude CAD.    RLS (restless legs syndrome)   Chronic kidney disease, stage III (moderate)    Last Assessment & Plan   05/24/2012 Office Visit Edited 05/24/2012  3:27 PM by Yehuda Savannah, MD     Creatinine increased to 1.8 at the time of recent hospital admission, but reverted back to baseline of 1.5. GFR is calculated at 35 cc per minute. Stable renal disease with mild to moderate dysfunction.  ACE inhibitor will be added for potential renal preservation with continued close monitoring of renal function.    DM type 2, uncontrolled, with neuropathy   Last Assessment & Plan   05/24/2012 Office Visit Written 05/24/2012  3:27 PM by Yehuda Savannah, MD     Recent hospital admission with glucose of 600 at presentation.  Dr. Jacelyn Grip is managing and will adjust therapy.    Psoriasis       Imaging: Dg Chest 2 View  07/15/2012  *RADIOLOGY REPORT*  Clinical Data: Hypertension  CHEST - 2 VIEW  Comparison: May 21, 2012.  Findings: Cardiomediastinal silhouette appears normal.  No acute pulmonary disease is noted.  Bony thorax is intact.  IMPRESSION: No acute cardiopulmonary abnormality seen.   Original Report Authenticated By: Marijo Conception.,  M.D.    Dg Hip Complete Left  07/26/2012  *RADIOLOGY REPORT*  Clinical Data: Left hip pain.  LEFT HIP - COMPLETE 2+ VIEW  Comparison: None.  Findings: Mild posterior spurring of the greater trochanter noted. No significant loss of articular space in the hand.  Minimal spurring of the acetabulum and femoral head noted.  No conventional radiographic findings of avascular necrosis.  IMPRESSION:  1.  Minimal spurring of the hip joint.  Mild posterior spurring of the greater trochanter.   Original Report Authenticated By: Van Clines, M.D.

## 2012-08-03 NOTE — Assessment & Plan Note (Signed)
Followed by Dr. Hinda Lenis with new AV fistula in the left arm. Well healed.

## 2012-08-03 NOTE — Patient Instructions (Signed)
Start Lisinopril 2.5mg  daily.  Your physician recommends that you schedule a follow-up appointment in: 3 months with Jory Sims, NP

## 2012-08-04 ENCOUNTER — Ambulatory Visit (INDEPENDENT_AMBULATORY_CARE_PROVIDER_SITE_OTHER): Payer: Medicare HMO

## 2012-08-04 ENCOUNTER — Encounter: Payer: Self-pay | Admitting: Adult Health

## 2012-08-04 ENCOUNTER — Ambulatory Visit: Payer: Medicare HMO

## 2012-08-04 DIAGNOSIS — Z780 Asymptomatic menopausal state: Secondary | ICD-10-CM

## 2012-08-17 ENCOUNTER — Encounter: Payer: Self-pay | Admitting: Gastroenterology

## 2012-08-17 ENCOUNTER — Ambulatory Visit (INDEPENDENT_AMBULATORY_CARE_PROVIDER_SITE_OTHER): Payer: Medicare PPO | Admitting: Gastroenterology

## 2012-08-17 VITALS — BP 94/51 | HR 68 | Temp 97.6°F | Ht 66.0 in | Wt 234.4 lb

## 2012-08-17 DIAGNOSIS — R1319 Other dysphagia: Secondary | ICD-10-CM

## 2012-08-17 DIAGNOSIS — R195 Other fecal abnormalities: Secondary | ICD-10-CM

## 2012-08-17 DIAGNOSIS — D649 Anemia, unspecified: Secondary | ICD-10-CM

## 2012-08-17 DIAGNOSIS — R1013 Epigastric pain: Secondary | ICD-10-CM

## 2012-08-17 NOTE — Progress Notes (Signed)
Primary Care Physician:  Anthoney Harada, MD Primary Gastroenterologist:  Dr. Gala Romney   Chief Complaint  Patient presents with  . HEME POSITIVE  . Abdominal Pain    HPI:   [name] is a 62 year old female presenting today at the request of Dr. Jacelyn Grip secondary to heme positive stools. Last colonoscopy by Dr. Gala Romney in 2011 normal except few scattered diverticula. Appears she has a personal history of colon polyps per her report. Last Hgb 11.6, Hct 34 in May 2014.   No hematochezia, no melena. Notes epigastric pain, worse with eating. No N/V. Occasional "choking". Edentulous. Hard to chew food like she is supposed to. No odynophagia. Notes hx of PUD, esophageal dilations in past. I can't find record of this in epic. +coughing at night. Last EGD about 6 years ago per her report. It is unclear if this was in [place] or here. No weight loss, lack of appetite. States has a BM every time she urinates. Not diarrhea. Noted as soft. Chronic for her. No NSAIDs. Prilosec daily. Controls GERD symptoms. States she had a bleeding ulcer secondary to Naprosyn when she was in [place].   +FH of colon cancer in father.   Past Medical History  Diagnosis Date  . Chest pain 2003    ?h/o MI, syncope; 2003 stress nuclear-inferoseptal scar, EF-54%; 2007-MI ruled out, echocardiogram-normal LV size, wall thickness and function  . RLS (restless legs syndrome)     sleep study in 2010: No apnea; moderate periodic movements  . GERD (gastroesophageal reflux disease)     H/o upper GI bleed  . GI bleeding   . Obese   . Gout   . Hypothyroidism   . Low back pain   . Cellulitis     Neck  . Diabetes mellitus 1998    A1c-7.1 in 2010, 6.7 in 2013  . Hyperlipidemia     Fasting lipids 2013:158, 152, 35, 93  . Hypertension 2002  . Anemia   . Hyperkalemia   . Irregular heart beat   . PONV (postoperative nausea and vomiting)   . Myocardial infarction   . Anginal pain     last cp few months ago  . CAD  (coronary artery disease)     dr. Lattie Haw ([facility])  . Depression     Insomnia  . Chronic kidney disease, stage III (moderate)     Creat-1.9 in 04/2008, 2.65 in 02/2010(noHD  . Headache(784.0)   . Vitiligo   . H/O hiatal hernia   . Cancer     HX SKIN CANCER  . Arthritis     Past Surgical History  Procedure Laterality Date  . Appendectomy    . Carpal tunnel release  1992, 1993, 2011    Right in 2011; previously bilateral  . Tubal ligation  1976  . Partial hysterectomy  1985    Vaginal  . Shoulder surgery  1997    Left  . Incise and drain abcess  2010    Neck-MRSA  . Colonoscopy  02/2002, 07/2009    few diverticula, otherwise negative  . Foot surgery  1984    Bilateral heel spurs  . Abdominal hysterectomy    . Shoulder surgery      RIGHT  ADJUSTED   . Av fistula placement Left 07/16/2012    Procedure: ARTERIOVENOUS (AV) FISTULA CREATION;  Surgeon: Mal Misty, MD;  Location: [place];  Service: Vascular;  Laterality: Left;  . Av fistula placement Left 07/29/2012    Procedure: ARTERIOVENOUS (AV) FISTULA CREATION;  Surgeon: Mal Misty, MD;  Location[facility] OR;  Service: Vascular;  Laterality: Left;  Left Brachial-Cephalic Arteriovenous Fistula Creation    Current Outpatient Prescriptions  Medication Sig Dispense Refill  . allopurinol (ZYLOPRIM) 100 MG tablet Take 100 mg by mouth every morning.       Marland Kitchen amLODipine (NORVASC) 10 MG tablet Take 0.5 tablets (5 mg total) by mouth 2 (two) times daily.  60 tablet  6  . aspirin EC 81 MG tablet Take 81 mg by mouth every morning.       . B-D ULTRAFINE III SHORT PEN 31G X 8 MM MISC       . carvedilol (COREG) 25 MG tablet Take 12.5 mg by mouth 2 (two) times daily.       . citalopram (CELEXA) 40 MG tablet Take 1 tablet (40 mg total) by mouth at bedtime.  30 tablet  0  . ergocalciferol (VITAMIN D2) 50000 UNITS capsule Take 50,000 Units by mouth once a week. Monday      . fenofibrate (TRICOR) 145 MG tablet Take 145 mg by mouth every  morning.       . furosemide (LASIX) 20 MG tablet Take 20 mg by mouth daily.       Marland Kitchen gabapentin (NEURONTIN) 800 MG tablet Take 2,400 mg by mouth at bedtime.      Marland Kitchen glucose blood (ACCU-CHEK AVIVA PLUS) test strip Use as instructed  100 each  12  . insulin aspart (NOVOLOG FLEXPEN) 100 UNIT/ML injection Inject 40 Units into the skin 3 (three) times daily before meals.       . Insulin Glargine (LANTUS SOLOSTAR) 100 UNIT/ML SOPN Inject 41-43 Units into the skin 2 (two) times daily. 41 units in the morning and 43 units at night      . KLOR-CON M10 10 MEQ tablet Take 10 mEq by mouth daily.       Marland Kitchen levothyroxine (SYNTHROID, LEVOTHROID) 50 MCG tablet Take 50 mcg by mouth every evening.      Marland Kitchen lisinopril (PRINIVIL,ZESTRIL) 2.5 MG tablet Take 5 mg by mouth daily.      . nitroGLYCERIN (NITROSTAT) 0.4 MG SL tablet Place 1 tablet (0.4 mg total) under the tongue every 5 (five) minutes as needed for chest pain.  50 tablet  0  . omeprazole (PRILOSEC) 20 MG capsule Take 1 capsule (20 mg total) by mouth every morning.  30 capsule  2  . pravastatin (PRAVACHOL) 40 MG tablet Take 1 tablet (40 mg total) by mouth at bedtime.  30 tablet  2  . rOPINIRole (REQUIP) 1 MG tablet Take 1 tablet (1 mg total) by mouth at bedtime.  30 tablet  2  . oxyCODONE (ROXICODONE) 5 MG immediate release tablet Take 1.5 tablets (7.5 mg total) by mouth every 6 (six) hours as needed for pain.  30 tablet  0   No current facility-administered medications for this visit.    Allergies as of 08/17/2012 - Review Complete 08/17/2012  Allergen Reaction Noted  . Latex Shortness Of Breath and Swelling 01/22/2009  . Vancomycin hcl Swelling 01/22/2009  . Quinapril hcl Swelling and Rash     Family History  Problem Relation Age of Onset  . Emphysema Mother   . COPD Mother   . Colon cancer Father   . Diabetes Father   . Cancer Father     colon  . Hypertension Father   . Congestive Heart Failure Maternal Grandmother   . Cancer Paternal  Grandmother     History  Social History  . Marital Status: Divorced    Spouse Name: N/A    Number of Children: 3  . Years of Education: N/A   Occupational History  . Disabled     previously Nurse, adult   Social History Main Topics  . Smoking status: Never Smoker   . Smokeless tobacco: Never Used  . Alcohol Use: No  . Drug Use: No  . Sexually Active: No   Other Topics Concern  . Not on file   Social History Narrative   No regular exercise    Review of Systems: Negative unless mentioned in HPI  Physical Exam: BP 94/51  Pulse 68  Temp(Src) 97.6 F (36.4 C) (Oral)  Ht 5\' 6"  (1.676 m)  Wt 234 lb 6.4 oz (106.323 kg)  BMI 37.85 kg/m2 General:   Alert and oriented. Pleasant and cooperative. Well-nourished and well-developed.  Head:  Normocephalic and atraumatic. Eyes:  Without icterus, sclera clear and conjunctiva pink.  Ears:  Normal auditory acuity. Nose:  No deformity, discharge,  or lesions. Mouth:  Edentulous Neck:  Supple, without mass or thyromegaly. Lungs:  Clear to auscultation bilaterally. No wheezes, rales, or rhonchi. No distress.  Heart:  S1, S2 present without murmurs appreciated.  Abdomen:  +BS, soft, mild TTP epigastric region and non-distended. No HSM noted. Possible small umbilical hernia Rectal:  Deferred  Msk:  Symmetrical without gross deformities. Normal posture. Extremities:  Without clubbing or edema. Neurologic:  Alert and  oriented x4;  grossly normal neurologically. Skin:  Intact without significant lesions or rashes. Cervical Nodes:  No significant cervical adenopathy. Psych:  Alert and cooperative. Normal mood and affect.

## 2012-08-17 NOTE — Patient Instructions (Signed)
Please have blood work done. We will call with the results.  We have scheduled you for an upper endoscopy and possible dilation with Dr. Gala Romney in the near future.  Diabetes instructions: TAKE 1/2 dose of Lantus the evening prior and NO Lantus the morning of the procedure.

## 2012-08-18 ENCOUNTER — Telehealth: Payer: Self-pay | Admitting: Gastroenterology

## 2012-08-18 ENCOUNTER — Other Ambulatory Visit: Payer: Self-pay | Admitting: Internal Medicine

## 2012-08-18 DIAGNOSIS — Z86010 Personal history of colonic polyps: Secondary | ICD-10-CM

## 2012-08-18 DIAGNOSIS — G8929 Other chronic pain: Secondary | ICD-10-CM

## 2012-08-18 DIAGNOSIS — R195 Other fecal abnormalities: Secondary | ICD-10-CM | POA: Insufficient documentation

## 2012-08-18 DIAGNOSIS — R1013 Epigastric pain: Secondary | ICD-10-CM | POA: Insufficient documentation

## 2012-08-18 DIAGNOSIS — D649 Anemia, unspecified: Secondary | ICD-10-CM

## 2012-08-18 NOTE — Progress Notes (Signed)
Cc PCP 

## 2012-08-18 NOTE — Assessment & Plan Note (Signed)
Check iron and ferritin.

## 2012-08-18 NOTE — Assessment & Plan Note (Addendum)
62 year old female found to be heme positive through PCP; also noted mild anemia with a Hgb of 11.6, unknown iron studies. Her last colonoscopy was in 2011 with few scattered diverticula, and she reports a personal history of colon polyps and also family history of colon cancer in her father. She has no lower GI symptoms such as rectal bleeding, change in bowel habits. However, she does note epigastric pain, worse with eating, but no melena. She avoids NSAIDs and self-reports a prior hx of PUD secondary to Naprosyn when she lived in [place]. She also states intermittent dysphagia to solid food, with reports of dilations in the past. Again, I am unable to find record of this in epic. She is edentulous, which very well may be the culprit of her vague dysphagia.   We will proceed with an upper endoscopy with dilation as appropriate with Dr. Gala Romney. The risks, benefit, and alternatives have been discussed in detail. She desires to proceed.  Obtain iron, ferritin It must be noted, after patient left and further review of records, I feel it is appropriate to pursue lower GI evaluation as well due to heme positive stools and FH of colon cancer/personal history of colon polyps. Although she has no lower GI symptoms, it has been at least 3 years, and repeat evaluation is warranted. I have attempted to call patient to inform.   ADDENDUM: Patient aware of TCS added to EGD.

## 2012-08-18 NOTE — Telephone Encounter (Signed)
After further review of her history, I feel that she needs a colonoscopy at time of EGD as well. Originally, I had discussed proceeding with only an upper endoscopy, as she only had upper GI symptoms. However, with her personal history of colon polyps and FH of colon cancer, I feel that having record of heme positive stool prompts further lower GI evaluation, as hers was 3 years ago.   Please add on for colonoscopy at time of EGD. I have attempted to contact patient and had to leave a message.

## 2012-08-18 NOTE — Progress Notes (Signed)
Noted pt was evaluated by KL NP at f/u office visit noted 08-03-12, pt also has f/u with Dr Jeri Lager on 08-25-12

## 2012-08-18 NOTE — Telephone Encounter (Signed)
Done

## 2012-08-18 NOTE — Assessment & Plan Note (Signed)
EGD as planned.

## 2012-08-18 NOTE — Assessment & Plan Note (Signed)
Dilation as appropriate.  

## 2012-08-23 ENCOUNTER — Other Ambulatory Visit: Payer: Self-pay | Admitting: Internal Medicine

## 2012-08-23 ENCOUNTER — Encounter (HOSPITAL_COMMUNITY): Payer: Self-pay | Admitting: Pharmacy Technician

## 2012-08-23 ENCOUNTER — Other Ambulatory Visit: Payer: Self-pay | Admitting: Family Medicine

## 2012-08-23 MED ORDER — PEG 3350-KCL-NA BICARB-NACL 420 G PO SOLR: 4000.0000 mL | ORAL | Status: DC

## 2012-08-24 LAB — IRON: Iron: 39 ug/dL — ABNORMAL LOW (ref 42–145)

## 2012-08-24 LAB — FERRITIN: Ferritin: 36 ng/mL (ref 10–291)

## 2012-08-25 ENCOUNTER — Ambulatory Visit (INDEPENDENT_AMBULATORY_CARE_PROVIDER_SITE_OTHER): Payer: Medicare PPO | Admitting: Cardiology

## 2012-08-25 ENCOUNTER — Encounter: Payer: Self-pay | Admitting: Cardiology

## 2012-08-25 VITALS — BP 125/65 | HR 67 | Ht 66.0 in | Wt 231.8 lb

## 2012-08-25 DIAGNOSIS — K219 Gastro-esophageal reflux disease without esophagitis: Secondary | ICD-10-CM

## 2012-08-25 DIAGNOSIS — N184 Chronic kidney disease, stage 4 (severe): Secondary | ICD-10-CM

## 2012-08-25 DIAGNOSIS — I10 Essential (primary) hypertension: Secondary | ICD-10-CM

## 2012-08-25 DIAGNOSIS — D649 Anemia, unspecified: Secondary | ICD-10-CM

## 2012-08-25 DIAGNOSIS — E785 Hyperlipidemia, unspecified: Secondary | ICD-10-CM

## 2012-08-25 MED ORDER — FUROSEMIDE 20 MG PO TABS: 60.0000 mg | ORAL_TABLET | Freq: Every day | ORAL | Status: DC

## 2012-08-25 MED ORDER — AMLODIPINE BESYLATE 5 MG PO TABS: 5.0000 mg | ORAL_TABLET | Freq: Every day | ORAL | Status: DC

## 2012-08-25 NOTE — Assessment & Plan Note (Addendum)
Blood pressure determinations have been good on multiple occasions since recent hospital discharge following vascular surgery for creation of an AV fistula. Current medication will be continued.  Patient will be asked to monitor blood pressure outside of the medical setting.

## 2012-08-25 NOTE — Assessment & Plan Note (Addendum)
Recent AV fistula creation without immediate plans for dialysis.  Patient currently treated with lisinopril, which may be contributing to elevation of BUN and creatinine. ACE inhibitors will be held and renal function reassessed.   If there is no striking change in renal function, ACE inhibitor should be resumed to retard progression of diabetic nephropathy.

## 2012-08-25 NOTE — Patient Instructions (Addendum)
Increase Lasix to 60mg  daily (3 20mg  tabs)  Decrease Amlodipine to 5mg  once daily (New Rx for 5mg  tabs)  STOP TAKING:  Aspirin, Fenofibrate, Lisinopril  Chest X-ray  Your physician has requested that you have an echocardiogram. Echocardiography is a painless test that uses sound waves to create images of your heart. It provides your doctor with information about the size and shape of your heart and how well your heart's chambers and valves are working. This procedure takes approximately one hour. There are no restrictions for this procedure.  LABS IN 1 WEEK:  BNP, TSH, BMET  Your physician recommends that you schedule a follow-up appointment in 10 days with Dr. Lattie Haw.

## 2012-08-25 NOTE — Assessment & Plan Note (Signed)
Recent lipid profile demonstrates adequate levels of total and LDL cholesterol. Triglycerides are moderately elevated, but do not require specific add-on therapy.

## 2012-08-25 NOTE — Assessment & Plan Note (Signed)
Mild to moderate chronic anemia persists with low normal iron and ferritin levels. Dr. Lowanda Foster is managing.

## 2012-08-25 NOTE — Progress Notes (Signed)
Patient ID: [name], female   DOB: [birth year], 62 y.o.   MRN: [id number]  HPI: Schedule return visit for continuing treatment of hypertension and multiple cardiovascular risk factors. Patient has done generally well, but recently notes dyspnea on mild exertion and a decline in exercise tolerance. She has also had increased edema and an erythematous rash over the medial aspect of both legs below the knee that developed suddenly approximately one week ago and faded over the course of a few days.  Current Outpatient Prescriptions  Medication Sig Dispense Refill  . allopurinol (ZYLOPRIM) 100 MG tablet Take 100 mg by mouth every morning.       Marland Kitchen amLODipine (NORVASC) 5 MG tablet Take 1 tablet (5 mg total) by mouth daily.  90 tablet  3  . carvedilol (COREG) 25 MG tablet Take 12.5 mg by mouth 2 (two) times daily.       . citalopram (CELEXA) 40 MG tablet Take 1 tablet (40 mg total) by mouth at bedtime.  30 tablet  0  . ergocalciferol (VITAMIN D2) 50000 UNITS capsule Take 50,000 Units by mouth once a week. Monday      . furosemide (LASIX) 20 MG tablet Take 3 tablets (60 mg total) by mouth daily.  270 tablet  3  . gabapentin (NEURONTIN) 800 MG tablet Take 2,400 mg by mouth at bedtime.      . insulin aspart (NOVOLOG FLEXPEN) 100 UNIT/ML injection Inject 40 Units into the skin 3 (three) times daily before meals.       . Insulin Glargine (LANTUS SOLOSTAR) 100 UNIT/ML SOPN Inject 41-43 Units into the skin 2 (two) times daily. 41 units in the morning and 43 units at night      . KLOR-CON M10 10 MEQ tablet TAKE ONE TABLET BY MOUTH TWICE DAILY  30 tablet  5  . levothyroxine (SYNTHROID, LEVOTHROID) 50 MCG tablet Take 50 mcg by mouth every evening.      . nitroGLYCERIN (NITROSTAT) 0.4 MG SL tablet Place 1 tablet (0.4 mg total) under the tongue every 5 (five) minutes as needed for chest pain.  50 tablet  0  . omeprazole (PRILOSEC) 20 MG capsule Take 1 capsule (20 mg total) by mouth every morning.  30 capsule  2  .  polyethylene glycol-electrolytes (TRILYTE) 420 G solution Take 4,000 mLs by mouth as directed.  4000 mL  0  . pravastatin (PRAVACHOL) 40 MG tablet Take 1 tablet (40 mg total) by mouth at bedtime.  30 tablet  2  . rOPINIRole (REQUIP) 1 MG tablet Take 1 tablet (1 mg total) by mouth at bedtime.  30 tablet  2   No current facility-administered medications for this visit.   Allergies  Allergen Reactions  . Latex Shortness Of Breath and Swelling    Facial swelling  . Vancomycin Hcl Swelling    REACTION: facial swelling  . Quinapril Hcl Swelling and Rash    Arm swelling     Past medical history, social history, and family history reviewed and updated.  ROS: Denies palpitations, lightheadedness or syncope. All other systems reviewed and are negative.  PHYSICAL EXAM: BP 125/65  Pulse 67  Ht 5\' 6"  (1.676 m)  Wt 105.121 kg (231 lb 12 oz)  BMI 37.42 kg/m2  SpO2 94%;  Body mass index is 37.42 kg/(m^2). General-Well developed; no acute distress Body habitus-proportionate weight and height Neck-No JVD; no carotid bruits Lungs-clear lung fields; resonant to percussion Cardiovascular-normal PMI; normal S1 and S2; minimal systolic ejection murmur Abdomen-normal  bowel sounds; soft and non-tender without masses or organomegaly Musculoskeletal-No deformities, no cyanosis or clubbing Neurologic-Normal cranial nerves; symmetric strength and tone Skin-Warm, no significant lesions Extremities-distal pulses intact; 1+ edema  O2 saturation on room air-94%  Weight has gradually increased from a value near 200 in 2010 to closer to 235 more recently  Jacqulyn Ducking, MD 08/25/2012  6:56 PM  ASSESSMENT AND PLAN

## 2012-08-26 NOTE — Progress Notes (Signed)
Quick Note:  Ferritin normal at 36 but significantly decreased from 2011 when it was 169.  Iron low at 39. Likely multifactorial mixed anemia with chronic disease and early IDA.  TCS/EGD as planned. ______

## 2012-08-27 ENCOUNTER — Encounter (HOSPITAL_COMMUNITY): Payer: Self-pay | Admitting: *Deleted

## 2012-08-27 ENCOUNTER — Ambulatory Visit: Admit: 2012-08-27 | Payer: Medicare PPO | Admitting: Internal Medicine

## 2012-08-27 ENCOUNTER — Ambulatory Visit (HOSPITAL_COMMUNITY)
Admission: RE | Admit: 2012-08-27 | Discharge: 2012-08-27 | Disposition: A | Discharge status: Home / Self Care | Payer: Medicare PPO | Source: Ambulatory Visit | Attending: Internal Medicine | Admitting: Internal Medicine

## 2012-08-27 ENCOUNTER — Encounter (HOSPITAL_COMMUNITY)
Admission: RE | Disposition: A | Discharge status: Home / Self Care | Payer: Self-pay | Source: Ambulatory Visit | Attending: Internal Medicine

## 2012-08-27 DIAGNOSIS — K5730 Diverticulosis of large intestine without perforation or abscess without bleeding: Secondary | ICD-10-CM

## 2012-08-27 DIAGNOSIS — Z86010 Personal history of colonic polyps: Secondary | ICD-10-CM

## 2012-08-27 DIAGNOSIS — D131 Benign neoplasm of stomach: Secondary | ICD-10-CM | POA: Insufficient documentation

## 2012-08-27 DIAGNOSIS — I129 Hypertensive chronic kidney disease with stage 1 through stage 4 chronic kidney disease, or unspecified chronic kidney disease: Secondary | ICD-10-CM | POA: Insufficient documentation

## 2012-08-27 DIAGNOSIS — R1310 Dysphagia, unspecified: Secondary | ICD-10-CM

## 2012-08-27 DIAGNOSIS — R195 Other fecal abnormalities: Secondary | ICD-10-CM | POA: Insufficient documentation

## 2012-08-27 DIAGNOSIS — N183 Chronic kidney disease, stage 3 unspecified: Secondary | ICD-10-CM | POA: Insufficient documentation

## 2012-08-27 DIAGNOSIS — R1013 Epigastric pain: Secondary | ICD-10-CM

## 2012-08-27 DIAGNOSIS — D649 Anemia, unspecified: Secondary | ICD-10-CM

## 2012-08-27 DIAGNOSIS — E119 Type 2 diabetes mellitus without complications: Secondary | ICD-10-CM | POA: Insufficient documentation

## 2012-08-27 DIAGNOSIS — Z01812 Encounter for preprocedural laboratory examination: Secondary | ICD-10-CM | POA: Insufficient documentation

## 2012-08-27 HISTORY — PX: COLONOSCOPY WITH ESOPHAGOGASTRODUODENOSCOPY (EGD): SHX5779

## 2012-08-27 LAB — GLUCOSE, CAPILLARY: Glucose-Capillary: 213 mg/dL — ABNORMAL HIGH (ref 70–99)

## 2012-08-27 SURGERY — COLONOSCOPY WITH ESOPHAGOGASTRODUODENOSCOPY (EGD)
Anesthesia: Moderate Sedation

## 2012-08-27 SURGERY — ESOPHAGOGASTRODUODENOSCOPY (EGD) WITH ESOPHAGEAL DILATION
Anesthesia: Moderate Sedation

## 2012-08-27 MED ORDER — MIDAZOLAM HCL 5 MG/5ML IJ SOLN
INTRAMUSCULAR | Status: DC | PRN
  Administered 2012-08-27: 2 mg via INTRAVENOUS
  Administered 2012-08-27 (×3): 1 mg via INTRAVENOUS
  Administered 2012-08-27: 2 mg via INTRAVENOUS

## 2012-08-27 MED ORDER — MEPERIDINE HCL 100 MG/ML IJ SOLN
INTRAMUSCULAR | Status: DC | PRN
  Administered 2012-08-27 (×2): 25 mg via INTRAVENOUS
  Administered 2012-08-27: 50 mg via INTRAVENOUS
  Administered 2012-08-27: 25 mg via INTRAVENOUS

## 2012-08-27 MED ORDER — MEPERIDINE HCL 100 MG/ML IJ SOLN
INTRAMUSCULAR | Status: AC
  Filled 2012-08-27: qty 2

## 2012-08-27 MED ORDER — MIDAZOLAM HCL 5 MG/5ML IJ SOLN
INTRAMUSCULAR | Status: AC
  Filled 2012-08-27: qty 10

## 2012-08-27 MED ORDER — BUTAMBEN-TETRACAINE-BENZOCAINE 2-2-14 % EX AERO
INHALATION_SPRAY | CUTANEOUS | Status: DC | PRN
  Administered 2012-08-27: 2 via TOPICAL

## 2012-08-27 MED ORDER — SODIUM CHLORIDE 0.9 % IV SOLN
INTRAVENOUS | Status: DC
  Administered 2012-08-27: 09:00:00 via INTRAVENOUS

## 2012-08-27 MED ORDER — ONDANSETRON HCL 4 MG/2ML IJ SOLN
INTRAMUSCULAR | Status: AC
  Filled 2012-08-27: qty 2

## 2012-08-27 MED ORDER — ONDANSETRON HCL 4 MG/2ML IJ SOLN
INTRAMUSCULAR | Status: DC | PRN
  Administered 2012-08-27: 4 mg via INTRAVENOUS

## 2012-08-27 NOTE — H&P (View-Only) (Signed)
Primary Care Physician:  Anthoney Harada, MD Primary Gastroenterologist:  Dr. Gala Romney   Chief Complaint  Patient presents with  . HEME POSITIVE  . Abdominal Pain    HPI:   [name] is a 63 year old female presenting today at the request of Dr. Jacelyn Grip secondary to heme positive stools. Last colonoscopy by Dr. Gala Romney in 2011 normal except few scattered diverticula. Appears she has a personal history of colon polyps per her report. Last Hgb 11.6, Hct 34 in May 2014.   No hematochezia, no melena. Notes epigastric pain, worse with eating. No N/V. Occasional "choking". Edentulous. Hard to chew food like she is supposed to. No odynophagia. Notes hx of PUD, esophageal dilations in past. I can't find record of this in epic. +coughing at night. Last EGD about 6 years ago per her report. It is unclear if this was in [place] or here. No weight loss, lack of appetite. States has a BM every time she urinates. Not diarrhea. Noted as soft. Chronic for her. No NSAIDs. Prilosec daily. Controls GERD symptoms. States she had a bleeding ulcer secondary to Naprosyn when she was in [place].   +FH of colon cancer in father.   Past Medical History  Diagnosis Date  . Chest pain 2003    ?h/o MI, syncope; 2003 stress nuclear-inferoseptal scar, EF-54%; 2007-MI ruled out, echocardiogram-normal LV size, wall thickness and function  . RLS (restless legs syndrome)     sleep study in 2010: No apnea; moderate periodic movements  . GERD (gastroesophageal reflux disease)     H/o upper GI bleed  . GI bleeding   . Obese   . Gout   . Hypothyroidism   . Low back pain   . Cellulitis     Neck  . Diabetes mellitus 1998    A1c-7.1 in 2010, 6.7 in 2013  . Hyperlipidemia     Fasting lipids 2013:158, 152, 35, 93  . Hypertension 2002  . Anemia   . Hyperkalemia   . Irregular heart beat   . PONV (postoperative nausea and vomiting)   . Myocardial infarction   . Anginal pain     last cp few months ago  . CAD  (coronary artery disease)     dr. Lattie Haw ([facility])  . Depression     Insomnia  . Chronic kidney disease, stage III (moderate)     Creat-1.9 in 04/2008, 2.65 in 02/2010(noHD  . Headache(784.0)   . Vitiligo   . H/O hiatal hernia   . Cancer     HX SKIN CANCER  . Arthritis     Past Surgical History  Procedure Laterality Date  . Appendectomy    . Carpal tunnel release  1992, 1993, 2011    Right in 2011; previously bilateral  . Tubal ligation  1976  . Partial hysterectomy  1985    Vaginal  . Shoulder surgery  1997    Left  . Incise and drain abcess  2010    Neck-MRSA  . Colonoscopy  02/2002, 07/2009    few diverticula, otherwise negative  . Foot surgery  1984    Bilateral heel spurs  . Abdominal hysterectomy    . Shoulder surgery      RIGHT  ADJUSTED   . Av fistula placement Left 07/16/2012    Procedure: ARTERIOVENOUS (AV) FISTULA CREATION;  Surgeon: Mal Misty, MD;  Location: [facility];  Service: Vascular;  Laterality: Left;  . Av fistula placement Left 07/29/2012    Procedure: ARTERIOVENOUS (AV) FISTULA CREATION;  Surgeon: Mal Misty, MD;  [facility];  Service: Vascular;  Laterality: Left;  Left Brachial-Cephalic Arteriovenous Fistula Creation    Current Outpatient Prescriptions  Medication Sig Dispense Refill  . allopurinol (ZYLOPRIM) 100 MG tablet Take 100 mg by mouth every morning.       Marland Kitchen amLODipine (NORVASC) 10 MG tablet Take 0.5 tablets (5 mg total) by mouth 2 (two) times daily.  60 tablet  6  . aspirin EC 81 MG tablet Take 81 mg by mouth every morning.       . B-D ULTRAFINE III SHORT PEN 31G X 8 MM MISC       . carvedilol (COREG) 25 MG tablet Take 12.5 mg by mouth 2 (two) times daily.       . citalopram (CELEXA) 40 MG tablet Take 1 tablet (40 mg total) by mouth at bedtime.  30 tablet  0  . ergocalciferol (VITAMIN D2) 50000 UNITS capsule Take 50,000 Units by mouth once a week. Monday      . fenofibrate (TRICOR) 145 MG tablet Take 145 mg by mouth every  morning.       . furosemide (LASIX) 20 MG tablet Take 20 mg by mouth daily.       Marland Kitchen gabapentin (NEURONTIN) 800 MG tablet Take 2,400 mg by mouth at bedtime.      Marland Kitchen glucose blood (ACCU-CHEK AVIVA PLUS) test strip Use as instructed  100 each  12  . insulin aspart (NOVOLOG FLEXPEN) 100 UNIT/ML injection Inject 40 Units into the skin 3 (three) times daily before meals.       . Insulin Glargine (LANTUS SOLOSTAR) 100 UNIT/ML SOPN Inject 41-43 Units into the skin 2 (two) times daily. 41 units in the morning and 43 units at night      . KLOR-CON M10 10 MEQ tablet Take 10 mEq by mouth daily.       Marland Kitchen levothyroxine (SYNTHROID, LEVOTHROID) 50 MCG tablet Take 50 mcg by mouth every evening.      Marland Kitchen lisinopril (PRINIVIL,ZESTRIL) 2.5 MG tablet Take 5 mg by mouth daily.      . nitroGLYCERIN (NITROSTAT) 0.4 MG SL tablet Place 1 tablet (0.4 mg total) under the tongue every 5 (five) minutes as needed for chest pain.  50 tablet  0  . omeprazole (PRILOSEC) 20 MG capsule Take 1 capsule (20 mg total) by mouth every morning.  30 capsule  2  . pravastatin (PRAVACHOL) 40 MG tablet Take 1 tablet (40 mg total) by mouth at bedtime.  30 tablet  2  . rOPINIRole (REQUIP) 1 MG tablet Take 1 tablet (1 mg total) by mouth at bedtime.  30 tablet  2  . oxyCODONE (ROXICODONE) 5 MG immediate release tablet Take 1.5 tablets (7.5 mg total) by mouth every 6 (six) hours as needed for pain.  30 tablet  0   No current facility-administered medications for this visit.    Allergies as of 08/17/2012 - Review Complete 08/17/2012  Allergen Reaction Noted  . Latex Shortness Of Breath and Swelling 01/22/2009  . Vancomycin hcl Swelling 01/22/2009  . Quinapril hcl Swelling and Rash     Family History  Problem Relation Age of Onset  . Emphysema Mother   . COPD Mother   . Colon cancer Father   . Diabetes Father   . Cancer Father     colon  . Hypertension Father   . Congestive Heart Failure Maternal Grandmother   . Cancer Paternal  Grandmother     History  Social History  . Marital Status: Divorced    Spouse Name: N/A    Number of Children: 3  . Years of Education: N/A   Occupational History  . Disabled     previously Nurse, adult   Social History Main Topics  . Smoking status: Never Smoker   . Smokeless tobacco: Never Used  . Alcohol Use: No  . Drug Use: No  . Sexually Active: No   Other Topics Concern  . Not on file   Social History Narrative   No regular exercise    Review of Systems: Negative unless mentioned in HPI  Physical Exam: BP 94/51  Pulse 68  Temp(Src) 97.6 F (36.4 C) (Oral)  Ht 5\' 6"  (1.676 m)  Wt 234 lb 6.4 oz (106.323 kg)  BMI 37.85 kg/m2 General:   Alert and oriented. Pleasant and cooperative. Well-nourished and well-developed.  Head:  Normocephalic and atraumatic. Eyes:  Without icterus, sclera clear and conjunctiva pink.  Ears:  Normal auditory acuity. Nose:  No deformity, discharge,  or lesions. Mouth:  Edentulous Neck:  Supple, without mass or thyromegaly. Lungs:  Clear to auscultation bilaterally. No wheezes, rales, or rhonchi. No distress.  Heart:  S1, S2 present without murmurs appreciated.  Abdomen:  +BS, soft, mild TTP epigastric region and non-distended. No HSM noted. Possible small umbilical hernia Rectal:  Deferred  Msk:  Symmetrical without gross deformities. Normal posture. Extremities:  Without clubbing or edema. Neurologic:  Alert and  oriented x4;  grossly normal neurologically. Skin:  Intact without significant lesions or rashes. Cervical Nodes:  No significant cervical adenopathy. Psych:  Alert and cooperative. Normal mood and affect.

## 2012-08-27 NOTE — Op Note (Signed)
[facility] [address] [place], [address]   COLONOSCOPY PROCEDURE REPORT  PATIENT: [name], [name]  MR#:         [id number] BIRTHDATE: [birth year] , 89  yrs. old GENDER: Female ENDOSCOPIST: R.  Garfield Cornea, MD FACP FACG REFERRED BY:  Yaakov Guthrie, M.D. PROCEDURE DATE:  08/27/2012 PROCEDURE:     diagnostic ileocolonoscopy  INDICATIONS: Hemoccult-positive stool  INFORMED CONSENT:  The risks, benefits, alternatives and imponderables including but not limited to bleeding, perforation as well as the possibility of a missed lesion have been reviewed.  The potential for biopsy, lesion removal, etc. have also been discussed.  Questions have been answered.  All parties agreeable. Please see the history and physical in the medical record for more information.  MEDICATIONS: Versed 7 mg IV and Demerol 125 mg IV in divided doses. Zofran 4 mg IV  DESCRIPTION OF PROCEDURE:  After a digital rectal exam was performed, the [id number])  colonoscope was advanced from the anus through the rectum and colon to the area of the cecum, ileocecal valve and appendiceal orifice.  The cecum was deeply intubated.  These structures were well-seen and photographed for the record.  From the level of the cecum and ileocecal valve, the scope was slowly and cautiously withdrawn.  The mucosal surfaces were carefully surveyed utilizing scope tip deflection to facilitate fold flattening as needed.  The scope was pulled down into the rectum where a thorough examination including retroflexion was performed.    FINDINGS:  Adequate preparation. Normal rectum. Scattered left-sided diverticula; the remainder of the colonic mucosa appeared normal. The distal 10 cm of terminal ileal mucosa also appeared normal.  THERAPEUTIC / DIAGNOSTIC MANEUVERS PERFORMED:  None  COMPLICATIONS: None  CECAL WITHDRAWAL TIME:  7 minutes  IMPRESSION:  Colonic diverticulosis  RECOMMENDATIONS: Repeat  colonoscopy in 5 years. See EGD report.   _______________________________ eSigned:  R. Garfield Cornea, MD FACP [facility] 08/27/2012 10:50 AM   CC:

## 2012-08-27 NOTE — Op Note (Signed)
[facility] [address] [place], [address]   ENDOSCOPY PROCEDURE REPORT  PATIENT: [name], [name]  MR#: [id number] BIRTHDATE: [birth year] , 62  yrs. old GENDER: Female ENDOSCOPIST: R.  Garfield Cornea, MD FACP FACG REFERRED BY:  Yaakov Guthrie, M.D. PROCEDURE DATE:  08/27/2012 PROCEDURE:     EGD with Venia Minks dilation followed by gastric polypectomy  INDICATIONS:      Hemoccult-positive stool; esophageal dysphagia  INFORMED CONSENT:   The risks, benefits, limitations, alternatives and imponderables have been discussed.  The potential for biopsy, esophogeal dilation, etc. have also been reviewed.  Questions have been answered.  All parties agreeable.  Please see the history and physical in the medical record for more information.  MEDICATIONS:  Versed 4 mg IV and Demerol 100 mg IV in divided doses. Cetacaine spray. Zofran 4 mg IV  DESCRIPTION OF PROCEDURE:   The [id number])  endoscope was introduced through the mouth and advanced to the second portion of the duodenum without difficulty or limitations.  The mucosal surfaces were surveyed very carefully during advancement of the scope and upon withdrawal.  Retroflexion view of the proximal stomach and esophagogastric junction was performed.      FINDINGS: Normal, patent tubular esophagus. Stomach empty. Small hiatal hernia. On the posterior gastric body there was a 1 cm angry pedunculated polyp with active using. Please see photographs. The remainder of the gastric because appeared normal. The pylorus patent. Normal first and second portion of the duodenum.  THERAPEUTIC / DIAGNOSTIC MANEUVERS PERFORMED:  A 54 French Maloney dilator was passed to full insertion easily. A look back revealed no apparent complication related this maneuver. Subsequently, the gastric polyp was removed with one pass of a hot snare cautery. The polypectomy site was subsequently clipped to ensure hemostasis.   COMPLICATIONS:   None  IMPRESSION:  Normal esophagus-status post passage of a Maloney dilator. Small hiatal hernia. Gastric polyp removed as described above with subsequent prophylactic clipping. This polyp was oozing. This lesion could easily explain her Hemoccult-positive stool.  RECOMMENDATIONS:   Followup on pathology. No MRI in the future until clip known to have passed. See colonoscopy report.    _______________________________ R. Garfield Cornea, MD FACP [facility] eSigned:  R. Garfield Cornea, MD FACP [facility] 08/27/2012 10:34 AM     CC:  PATIENT NAME:  [name], [name] MR#: [id number]

## 2012-08-27 NOTE — Interval H&P Note (Signed)
History and Physical Interval Note:  08/27/2012 9:59 AM  [name]  has presented today for surgery, with the diagnosis of Hx of Colon polyps, Heme positive stool, Epigastric Pain, Anemia  The various methods of treatment have been discussed with the patient and family. After consideration of risks, benefits and other options for treatment, the patient has consented to  Procedure(s) with comments: COLONOSCOPY WITH ESOPHAGOGASTRODUODENOSCOPY (EGD) (N/A) - 9:45 as a surgical intervention .  The patient's history has been reviewed, patient examined, no change in status, stable for surgery.  I have reviewed the patient's chart and labs.  Questions were answered to the patient's satisfaction.      Dysphagia, abdominal pain Hemoccult-positive stool. History of polyps. History of peptic ulcer disease. Positive family history colon cancer. EGD with possible esophageal dilation and colonoscopy now being done per plan. The risks, benefits, limitations, imponderables and alternatives regarding both EGD and colonoscopy have been reviewed with the patient. Questions have been answered. All parties agreeable.   Manus Rudd

## 2012-08-31 ENCOUNTER — Encounter (HOSPITAL_COMMUNITY): Payer: Self-pay | Admitting: Internal Medicine

## 2012-09-01 ENCOUNTER — Encounter: Payer: Self-pay | Admitting: Internal Medicine

## 2012-09-01 NOTE — Progress Notes (Unsigned)
Letter from: Daneil Dolin   Reason for Letter: Results Review   Send letter to patient.  Send copy of letter with path to referring provider and PCP.  Patient needs an office followup appointment with extender in 4-6 weeks if not already done

## 2012-09-03 ENCOUNTER — Ambulatory Visit (HOSPITAL_COMMUNITY)
Admission: RE | Admit: 2012-09-03 | Discharge: 2012-09-03 | Disposition: A | Discharge status: Home / Self Care | Payer: Medicare PPO | Source: Ambulatory Visit | Attending: Cardiology | Admitting: Cardiology

## 2012-09-03 DIAGNOSIS — E785 Hyperlipidemia, unspecified: Secondary | ICD-10-CM | POA: Insufficient documentation

## 2012-09-03 DIAGNOSIS — I10 Essential (primary) hypertension: Secondary | ICD-10-CM

## 2012-09-03 DIAGNOSIS — D649 Anemia, unspecified: Secondary | ICD-10-CM

## 2012-09-03 DIAGNOSIS — N184 Chronic kidney disease, stage 4 (severe): Secondary | ICD-10-CM

## 2012-09-03 DIAGNOSIS — K219 Gastro-esophageal reflux disease without esophagitis: Secondary | ICD-10-CM

## 2012-09-03 DIAGNOSIS — E119 Type 2 diabetes mellitus without complications: Secondary | ICD-10-CM | POA: Insufficient documentation

## 2012-09-03 DIAGNOSIS — I129 Hypertensive chronic kidney disease with stage 1 through stage 4 chronic kidney disease, or unspecified chronic kidney disease: Secondary | ICD-10-CM | POA: Insufficient documentation

## 2012-09-03 DIAGNOSIS — R0609 Other forms of dyspnea: Secondary | ICD-10-CM | POA: Insufficient documentation

## 2012-09-03 DIAGNOSIS — I369 Nonrheumatic tricuspid valve disorder, unspecified: Secondary | ICD-10-CM

## 2012-09-03 DIAGNOSIS — R0989 Other specified symptoms and signs involving the circulatory and respiratory systems: Secondary | ICD-10-CM | POA: Insufficient documentation

## 2012-09-03 LAB — BASIC METABOLIC PANEL
Glucose, Bld: 386 mg/dL — ABNORMAL HIGH (ref 70–99)
Potassium: 4.6 mEq/L (ref 3.5–5.3)
Sodium: 133 mEq/L — ABNORMAL LOW (ref 135–145)

## 2012-09-03 NOTE — Progress Notes (Signed)
*  PRELIMINARY RESULTS* Echocardiogram 2D Echocardiogram has been performed.  [name] 09/03/2012, 8:23 AM

## 2012-09-04 LAB — BRAIN NATRIURETIC PEPTIDE: Brain Natriuretic Peptide: 211.2 pg/mL — ABNORMAL HIGH (ref 0.0–100.0)

## 2012-09-04 LAB — TSH: TSH: 2.375 u[IU]/mL (ref 0.350–4.500)

## 2012-09-06 ENCOUNTER — Encounter: Payer: Self-pay | Admitting: *Deleted

## 2012-09-06 ENCOUNTER — Ambulatory Visit (INDEPENDENT_AMBULATORY_CARE_PROVIDER_SITE_OTHER): Payer: Medicare PPO | Admitting: Cardiology

## 2012-09-06 ENCOUNTER — Other Ambulatory Visit: Payer: Self-pay | Admitting: *Deleted

## 2012-09-06 ENCOUNTER — Telehealth: Payer: Self-pay | Admitting: Family Medicine

## 2012-09-06 ENCOUNTER — Encounter: Payer: Self-pay | Admitting: Cardiology

## 2012-09-06 VITALS — BP 143/70 | HR 72 | Ht 66.0 in | Wt 231.0 lb

## 2012-09-06 DIAGNOSIS — I10 Essential (primary) hypertension: Secondary | ICD-10-CM

## 2012-09-06 DIAGNOSIS — N184 Chronic kidney disease, stage 4 (severe): Secondary | ICD-10-CM

## 2012-09-06 DIAGNOSIS — IMO0002 Reserved for concepts with insufficient information to code with codable children: Secondary | ICD-10-CM

## 2012-09-06 DIAGNOSIS — E1142 Type 2 diabetes mellitus with diabetic polyneuropathy: Secondary | ICD-10-CM

## 2012-09-06 DIAGNOSIS — E1149 Type 2 diabetes mellitus with other diabetic neurological complication: Secondary | ICD-10-CM

## 2012-09-06 DIAGNOSIS — R079 Chest pain, unspecified: Secondary | ICD-10-CM

## 2012-09-06 DIAGNOSIS — D649 Anemia, unspecified: Secondary | ICD-10-CM

## 2012-09-06 DIAGNOSIS — E1165 Type 2 diabetes mellitus with hyperglycemia: Secondary | ICD-10-CM

## 2012-09-06 MED ORDER — CARVEDILOL 25 MG PO TABS: 25.0000 mg | ORAL_TABLET | Freq: Two times a day (BID) | ORAL | Status: DC

## 2012-09-06 MED ORDER — LISINOPRIL 5 MG PO TABS: 5.0000 mg | ORAL_TABLET | Freq: Every day | ORAL | Status: DC

## 2012-09-06 MED ORDER — CITALOPRAM HYDROBROMIDE 40 MG PO TABS: 40.0000 mg | ORAL_TABLET | Freq: Every day | ORAL | Status: DC

## 2012-09-06 MED ORDER — FUROSEMIDE 40 MG PO TABS: 60.0000 mg | ORAL_TABLET | Freq: Every day | ORAL | Status: DC

## 2012-09-06 NOTE — Assessment & Plan Note (Signed)
Renal function improved since previous visit, but this may be the result of temporary discontinuation of diuretic. Furosemide will be resumed and low dose ACE inhibitor added for treatment of presumed diabetic nephropathy. Continued monitoring of electrolytes and renal function.

## 2012-09-06 NOTE — Progress Notes (Signed)
Patient ID: [name], female   DOB: October 11, 1950, 62 y.o.   MRN: [id number]  HPI: Schedule return visit for this nice woman with moderate to severe chronic kidney disease and exertional dyspnea.  Symptomatically, she is unchanged. She requested a refill of furosemide from her pharmacy, but they told her she could not obtain one. Accordingly, she has had no diuretic for the past 3 days. She has no orthopnea nor PND, but experiences dyspnea with mild exertion. Chest x-ray showed questionable vascular redistribution. An echocardiogram indicates preserved left ventricular systolic function. BNP level was not significantly elevated.  Current Outpatient Prescriptions  Medication Sig Dispense Refill  . allopurinol (ZYLOPRIM) 100 MG tablet Take 100 mg by mouth every morning.       Marland Kitchen amLODipine (NORVASC) 5 MG tablet Take 1 tablet (5 mg total) by mouth daily.  90 tablet  3  . carvedilol (COREG) 25 MG tablet Take 12.5 mg by mouth 2 (two) times daily.       . citalopram (CELEXA) 40 MG tablet Take 1 tablet (40 mg total) by mouth at bedtime.  30 tablet  0  . ergocalciferol (VITAMIN D2) 50000 UNITS capsule Take 50,000 Units by mouth once a week. Monday      . furosemide (LASIX) 20 MG tablet Take 3 tablets (60 mg total) by mouth daily.  270 tablet  3  . gabapentin (NEURONTIN) 800 MG tablet Take 2,400 mg by mouth at bedtime.      . insulin aspart (NOVOLOG FLEXPEN) 100 UNIT/ML injection Inject 40 Units into the skin 3 (three) times daily before meals.       . Insulin Glargine (LANTUS SOLOSTAR) 100 UNIT/ML SOPN Inject 41-43 Units into the skin 2 (two) times daily. 41 units in the morning and 43 units at night      . KLOR-CON M10 10 MEQ tablet TAKE ONE TABLET BY MOUTH TWICE DAILY  30 tablet  5  . levothyroxine (SYNTHROID, LEVOTHROID) 50 MCG tablet Take 50 mcg by mouth every evening.      . nitroGLYCERIN (NITROSTAT) 0.4 MG SL tablet Place 1 tablet (0.4 mg total) under the tongue every 5 (five) minutes as needed for  chest pain.  50 tablet  0  . omeprazole (PRILOSEC) 20 MG capsule Take 1 capsule (20 mg total) by mouth every morning.  30 capsule  2  . polyethylene glycol-electrolytes (TRILYTE) 420 G solution Take 4,000 mLs by mouth as directed.  4000 mL  0  . pravastatin (PRAVACHOL) 40 MG tablet Take 1 tablet (40 mg total) by mouth at bedtime.  30 tablet  2  . rOPINIRole (REQUIP) 1 MG tablet Take 1 tablet (1 mg total) by mouth at bedtime.  30 tablet  2   No current facility-administered medications for this visit.   Allergies  Allergen Reactions  . Latex Shortness Of Breath and Swelling    Facial swelling  . Vancomycin Hcl Swelling    REACTION: facial swelling  . Quinapril Hcl Swelling and Rash    Arm swelling  Past medical history, social history, and family history reviewed and updated.  ROS: Denies orthopnea, PND, palpitations, lightheadedness or syncope. Uncomplicated colonoscopy and upper endoscopy revealed a gastric polyp that was excised. All other systems reviewed and are negative.  PHYSICAL EXAM: BP 143/70  Pulse 72  Ht 5\' 6"  (1.676 m)  Wt 104.781 kg (231 lb)  BMI 37.3 kg/m2;  Body mass index is 37.3 kg/(m^2). General-Well developed; no acute distress Body habitus-moderately overweight Neck-No JVD;  no carotid bruits Lungs-clear lung fields; resonant to percussion Cardiovascular-normal PMI; normal S1 and S2; prominent S4 Abdomen-normal bowel sounds; soft and non-tender without masses or organomegaly; no bruits Musculoskeletal-No deformities, no cyanosis or clubbing Neurologic-Normal cranial nerves; symmetric strength and tone Skin-Warm, no significant lesions Extremities-distal pulses intact; 1+ ankle edema  Jacqulyn Ducking, MD 09/06/2012  1:31 PM  ASSESSMENT AND PLAN

## 2012-09-06 NOTE — Progress Notes (Deleted)
Name: [name]    DOB: 1950/05/12  Age: 62 y.o.  MR#: [id number]       PCP:  Anthoney Harada, MD      Insurance: Payor: [employer] / Plan: [employer] MED[employer] CHOICE PPO / Product Type: *No Product type* /   CC:    Chief Complaint  Patient presents with  . Hypertension  . Chest Pain    VS Filed Vitals:   09/06/12 1308  Height: 5\' 6"  (1.676 m)  Weight: 104.781 kg (231 lb)    Weights Current Weight  09/06/12 104.781 kg (231 lb)  08/27/12 104.781 kg (231 lb)  08/27/12 104.781 kg (231 lb)    Blood Pressure  BP Readings from Last 3 Encounters:  08/27/12 118/60  08/27/12 118/60  08/25/12 125/65     Admit date:  (Not on file) Last encounter with RMR:  08/25/2012   Allergy Latex; Vancomycin hcl; and Quinapril hcl  Current Outpatient Prescriptions  Medication Sig Dispense Refill  . allopurinol (ZYLOPRIM) 100 MG tablet Take 100 mg by mouth every morning.       Marland Kitchen amLODipine (NORVASC) 5 MG tablet Take 1 tablet (5 mg total) by mouth daily.  90 tablet  3  . carvedilol (COREG) 25 MG tablet Take 12.5 mg by mouth 2 (two) times daily.       . citalopram (CELEXA) 40 MG tablet Take 1 tablet (40 mg total) by mouth at bedtime.  30 tablet  0  . ergocalciferol (VITAMIN D2) 50000 UNITS capsule Take 50,000 Units by mouth once a week. Monday      . furosemide (LASIX) 20 MG tablet Take 3 tablets (60 mg total) by mouth daily.  270 tablet  3  . gabapentin (NEURONTIN) 800 MG tablet Take 2,400 mg by mouth at bedtime.      . insulin aspart (NOVOLOG FLEXPEN) 100 UNIT/ML injection Inject 40 Units into the skin 3 (three) times daily before meals.       . Insulin Glargine (LANTUS SOLOSTAR) 100 UNIT/ML SOPN Inject 41-43 Units into the skin 2 (two) times daily. 41 units in the morning and 43 units at night      . KLOR-CON M10 10 MEQ tablet TAKE ONE TABLET BY MOUTH TWICE DAILY  30 tablet  5  . levothyroxine (SYNTHROID, LEVOTHROID) 50 MCG tablet Take 50 mcg by mouth every evening.      . nitroGLYCERIN  (NITROSTAT) 0.4 MG SL tablet Place 1 tablet (0.4 mg total) under the tongue every 5 (five) minutes as needed for chest pain.  50 tablet  0  . omeprazole (PRILOSEC) 20 MG capsule Take 1 capsule (20 mg total) by mouth every morning.  30 capsule  2  . polyethylene glycol-electrolytes (TRILYTE) 420 G solution Take 4,000 mLs by mouth as directed.  4000 mL  0  . pravastatin (PRAVACHOL) 40 MG tablet Take 1 tablet (40 mg total) by mouth at bedtime.  30 tablet  2  . rOPINIRole (REQUIP) 1 MG tablet Take 1 tablet (1 mg total) by mouth at bedtime.  30 tablet  2   No current facility-administered medications for this visit.    Discontinued Meds:   There are no discontinued medications.  Patient Active Problem List   Diagnosis Date Noted  . Heme positive stool 08/18/2012  . Psoriasis 07/26/2012  . DM type 2, uncontrolled, with neuropathy 04/29/2012  . Chest pain   . RLS (restless legs syndrome)   . Chronic kidney disease, stage IV (severe)   .  Gout 11/22/2009  . Anemia, normocytic normochromic 07/24/2009  . Hypothyroidism 01/22/2009  . Hyperlipidemia 01/22/2009  . Obesity 01/22/2009  . Hypertension 01/22/2009  . Gastroesophageal reflux disease 01/22/2009    LABS    Component Value Date/Time   NA 133* 09/03/2012 0850   NA 137 07/29/2012 0648   NA 133* 07/26/2012 1140   K 4.6 09/03/2012 0850   K 4.0 07/29/2012 0648   K 4.7 07/26/2012 1140   CL 97 09/03/2012 0850   CL 97 07/26/2012 1140   CL 92* 07/22/2012 1025   CO2 25 09/03/2012 0850   CO2 23 07/26/2012 1140   CO2 21 07/22/2012 1025   GLUCOSE 386* 09/03/2012 0850   GLUCOSE 359* 07/29/2012 0648   GLUCOSE 227* 07/26/2012 1140   BUN 26* 09/03/2012 0850   BUN 44* 07/26/2012 1140   BUN 43* 07/22/2012 1025   CREATININE 1.94* 09/03/2012 0850   CREATININE 2.54* 07/26/2012 1140   CREATININE 2.51* 07/22/2012 1025   CREATININE 1.52* 05/23/2012 0608   CREATININE 1.58* 05/22/2012 0510   CREATININE 1.80* 05/21/2012 1517   CREATININE 1.81* 05/15/2008 1046   CALCIUM 9.6 09/03/2012  0850   CALCIUM 9.5 07/26/2012 1140   CALCIUM 8.9 07/22/2012 1025   GFRNONAA 36* 05/23/2012 0608   GFRNONAA 34* 05/22/2012 0510   GFRNONAA 29* 05/21/2012 1517   GFRAA 41* 05/23/2012 0608   GFRAA 39* 05/22/2012 0510   GFRAA 34* 05/21/2012 1517   CMP     Component Value Date/Time   NA 133* 09/03/2012 0850   K 4.6 09/03/2012 0850   CL 97 09/03/2012 0850   CO2 25 09/03/2012 0850   GLUCOSE 386* 09/03/2012 0850   BUN 26* 09/03/2012 0850   CREATININE 1.94* 09/03/2012 0850   CREATININE 1.52* 05/23/2012 0608   CREATININE 1.81* 05/15/2008 1046   CALCIUM 9.6 09/03/2012 0850   PROT 7.9 07/26/2012 1140   ALBUMIN 4.1 07/26/2012 1140   AST 14 07/26/2012 1140   ALT 14 07/26/2012 1140   ALKPHOS 93 07/26/2012 1140   BILITOT 0.5 07/26/2012 1140   GFRNONAA 36* 05/23/2012 0608   GFRAA 41* 05/23/2012 0608       Component Value Date/Time   WBC 8.8 05/22/2012 0510   WBC 10.3 05/21/2012 1517   WBC 9.2 03/03/2010 2110   HGB 11.6* 07/29/2012 0648   HGB 11.6* 07/16/2012 0703   HGB 12.0 05/22/2012 0510   HCT 34.0* 07/29/2012 0648   HCT 34.0* 07/16/2012 0703   HCT 35.5* 05/22/2012 0510   MCV 82.6 05/22/2012 0510   MCV 82.3 05/21/2012 1517   MCV 85.8 03/03/2010 2110    Lipid Panel     Component Value Date/Time   CHOL 149 01/22/2009 0000   TRIG 527* 07/26/2012 1140   TRIG 287 01/22/2009 0000   HDL 21 01/22/2009 0000   CHOLHDL 7.1 05/15/2008 0513   VLDL 57* 05/15/2008 0513   LDLCALC 72 07/26/2012 1140   LDLCALC 71 01/22/2009 0000   ABG    Component Value Date/Time   PHART 7.398 03/03/2010 2120   PCO2ART 37.8 03/03/2010 2120   PO2ART 74.9* 03/03/2010 2120   HCO3 22.8 03/03/2010 2120   TCO2 20.8 03/03/2010 2120   ACIDBASEDEF 1.3 03/03/2010 2120   O2SAT 94.5 03/03/2010 2120   Lab Results  Component Value Date   TSH 2.375 09/03/2012   BNP (last 3 results) No results found for this basename: PROBNP,  in the last 8760 hours Cardiac Panel (last 3 results) No results found for this basename: CKTOTAL, CKMB,  TROPONINI, RELINDX,  in the last 72 hours   Iron/TIBC/Ferritin    Component Value Date/Time   IRON 39* 08/23/2012 1440   TIBC 306 08/03/2009 1955   FERRITIN 36 08/23/2012 1440    Prior Assessment and Plan Problem List as of 09/06/2012   Anemia, normocytic normochromic   Last Assessment & Plan   08/25/2012 Office Visit Written 08/25/2012 11:33 AM by Yehuda Savannah, MD     Mild to moderate chronic anemia persists with low normal iron and ferritin levels. Dr. Lowanda Foster is managing.    Chest pain   Last Assessment & Plan   08/03/2012 Office Visit Written 08/03/2012  1:48 PM by Lendon Colonel, NP     She denies chest pain or dyspnea. Continue current medication regimen.    Chronic kidney disease, stage IV (severe)   Last Assessment & Plan   08/25/2012 Office Visit Edited 08/25/2012  7:03 PM by Yehuda Savannah, MD     Recent AV fistula creation without immediate plans for dialysis.  Patient currently treated with lisinopril, which may be contributing to elevation of BUN and creatinine. ACE inhibitors will be held and renal function reassessed.   If there is no striking change in renal function, ACE inhibitor should be resumed to retard progression of diabetic nephropathy.    DM type 2, uncontrolled, with neuropathy   Last Assessment & Plan   05/24/2012 Office Visit Written 05/24/2012  3:27 PM by Yehuda Savannah, MD     Recent hospital admission with glucose of 600 at presentation.  Dr. Jacelyn Grip is managing and will adjust therapy.    Gastroesophageal reflux disease   Gout   Heme positive stool   Last Assessment & Plan   08/17/2012 Office Visit Edited 08/26/2012  9:39 PM by Orvil Feil, NP     62 year old female found to be heme positive through PCP; also noted mild anemia with a Hgb of 11.6, unknown iron studies. Her last colonoscopy was in 2011 with few scattered diverticula, and she reports a personal history of colon polyps and also family history of colon cancer in her father. She has no lower GI symptoms such as rectal bleeding, change in  bowel habits. However, she does note epigastric pain, worse with eating, but no melena. She avoids NSAIDs and self-reports a prior hx of PUD secondary to Naprosyn when she lived in [place]. She also states intermittent dysphagia to solid food, with reports of dilations in the past. Again, I am unable to find record of this in epic. She is edentulous, which very well may be the culprit of her vague dysphagia.   We will proceed with an upper endoscopy with dilation as appropriate with Dr. Gala Romney. The risks, benefit, and alternatives have been discussed in detail. She desires to proceed.  Obtain iron, ferritin It must be noted, after patient left and further review of records, I feel it is appropriate to pursue lower GI evaluation as well due to heme positive stools and FH of colon cancer/personal history of colon polyps. Although she has no lower GI symptoms, it has been at least 3 years, and repeat evaluation is warranted. I have attempted to call patient to inform.   ADDENDUM: Patient aware of TCS added to EGD.     Hyperlipidemia   Last Assessment & Plan   08/25/2012 Office Visit Written 08/25/2012  7:04 PM by Yehuda Savannah, MD     Recent lipid profile demonstrates adequate levels of total and LDL cholesterol. Triglycerides  are moderately elevated, but do not require specific add-on therapy.    Hypertension   Last Assessment & Plan   08/25/2012 Office Visit Edited 08/26/2012  9:33 AM by Yehuda Savannah, MD     Blood pressure determinations have been good on multiple occasions since recent hospital discharge following vascular surgery for creation of an AV fistula. Current medication will be continued.  Patient will be asked to monitor blood pressure outside of the medical setting.    Hypothyroidism   Obesity   Psoriasis   RLS (restless legs syndrome)      Imaging: Dg Chest 2 View  09/03/2012   *RADIOLOGY REPORT*  Clinical Data: 62 year old female with shortness of breath.  CHEST - 2 VIEW   Comparison: 07/15/2012 and earlier.  Findings: Stable lung volumes.  Stable cardiac size and mediastinal contours.  Visualized tracheal air column is within normal limits. No pneumothorax, pulmonary edema, pleural effusion or confluent pulmonary opacity.  Diffuse interstitial markings, appear increased.  No pleural fluid identified.  No acute osseous abnormality identified.  IMPRESSION: Increased interstitial markings, could reflect viral / atypical infection or vascular congestion.  No overt edema.   Original Report Authenticated By: Roselyn Reef, M.D.

## 2012-09-06 NOTE — Assessment & Plan Note (Signed)
Control of diabetes is suboptimal. Patient referred back to PCP for further attention to this issue.

## 2012-09-06 NOTE — Patient Instructions (Addendum)
Your physician recommends that you schedule a follow-up appointment in: [place] physician recommends that you return for lab work in: [place] ([facility])   Your physician has recommended you make the following change in your medication:   1) INCREASE COREG TO 25MG  TWICE DAILY 2) START LISINOPRIL 5MG  ONCE DAILY 3) CHANGE LASIX TO TAKE 40MG  TABLETS (ONE AND A HALF TAB TO EQUAL THE 60MG  REQUIRED INSTEAD OF 3 -20MG  TABLETS AS BEFORE) ONE TIME DAILY  Your physician has requested that you have en exercise stress myoview. For further information please visit [email or web address]. Please follow instruction sheet, as given.  DO NOT TAKE YOUR INSULIN, NOR YOUR COREG THE MORNING OF THE STRESS MYOVIEW

## 2012-09-06 NOTE — Assessment & Plan Note (Addendum)
Mild anemia with low serum iron and borderline low ferritin. Empiric treatment with iron can be considered. Patient apparently had chronic blood loss related to a gastric polyp. Continued monitoring to exclude occult GI blood loss would be appropriate.

## 2012-09-06 NOTE — Assessment & Plan Note (Addendum)
Blood pressure control now suboptimal with discontinuation of furosemide temporarily and a decrease in her dose of amlodipine. The latter has resulted in improved peripheral edema. Lisinopril will be added to her regime and her dose of carvedilol increased. She will continue to monitor her own blood pressures and report elevated values.

## 2012-09-07 ENCOUNTER — Encounter: Payer: Self-pay | Admitting: Cardiology

## 2012-09-07 ENCOUNTER — Other Ambulatory Visit: Payer: Self-pay | Admitting: Family Medicine

## 2012-09-07 MED ORDER — GABAPENTIN 800 MG PO TABS: 2400.0000 mg | ORAL_TABLET | Freq: Every day | ORAL | Status: DC

## 2012-09-07 MED ORDER — CITALOPRAM HYDROBROMIDE 40 MG PO TABS: 40.0000 mg | ORAL_TABLET | Freq: Every day | ORAL | Status: DC

## 2012-09-07 NOTE — Telephone Encounter (Signed)
Informed patient [name]) that meds have been refilled

## 2012-09-07 NOTE — Telephone Encounter (Signed)
This is [name] Rx refill done in [facility]

## 2012-09-08 NOTE — Progress Notes (Signed)
Pt is aware of OV on 7/16 at 1030 with AS and appt card was mailed

## 2012-09-09 ENCOUNTER — Encounter: Payer: Self-pay | Admitting: *Deleted

## 2012-09-09 ENCOUNTER — Encounter (HOSPITAL_COMMUNITY)
Admission: RE | Admit: 2012-09-09 | Discharge: 2012-09-09 | Disposition: A | Discharge status: Home / Self Care | Payer: Medicare PPO | Source: Ambulatory Visit | Attending: Cardiology | Admitting: Cardiology

## 2012-09-09 ENCOUNTER — Encounter (HOSPITAL_COMMUNITY): Payer: Self-pay

## 2012-09-09 DIAGNOSIS — N186 End stage renal disease: Secondary | ICD-10-CM

## 2012-09-09 DIAGNOSIS — R079 Chest pain, unspecified: Secondary | ICD-10-CM

## 2012-09-09 DIAGNOSIS — I10 Essential (primary) hypertension: Secondary | ICD-10-CM

## 2012-09-09 DIAGNOSIS — R0989 Other specified symptoms and signs involving the circulatory and respiratory systems: Secondary | ICD-10-CM

## 2012-09-09 DIAGNOSIS — R0609 Other forms of dyspnea: Secondary | ICD-10-CM

## 2012-09-09 MED ORDER — REGADENOSON 0.4 MG/5ML IV SOLN
INTRAVENOUS | Status: AC
  Administered 2012-09-09: 0.4 mg via INTRAVENOUS
  Filled 2012-09-09: qty 5

## 2012-09-09 MED ORDER — SODIUM CHLORIDE 0.9 % IJ SOLN
INTRAMUSCULAR | Status: AC
  Administered 2012-09-09: 10 mL via INTRAVENOUS
  Filled 2012-09-09: qty 10

## 2012-09-09 MED ORDER — TECHNETIUM TC 99M SESTAMIBI - CARDIOLITE
30.0000 | Freq: Once | INTRAVENOUS | Status: AC | PRN
  Administered 2012-09-09: 13:00:00 28 via INTRAVENOUS

## 2012-09-09 MED ORDER — TECHNETIUM TC 99M SESTAMIBI - CARDIOLITE
10.0000 | Freq: Once | INTRAVENOUS | Status: AC | PRN
  Administered 2012-09-09: 10 via INTRAVENOUS

## 2012-09-09 NOTE — Progress Notes (Signed)
Stress Lab Nurses Notes - [name]  [name] 09/09/2012 Reason for doing test: Chest Pain and HTN Type of test:  unable to reach THR while on Treadmill, test changed to Wille Glaser Nurse performing test: Gerrit Halls, RN Nuclear Medicine Tech: Melburn Hake Echo Tech: Not Applicable MD performing test: R. Rothbart Family MD: Dr. Jacelyn Grip Test explained and consent signed: yes IV started: 22g jelco, Saline lock flushed, No redness or edema and Saline lock started in radiology Symptoms: SOB, fatigue & chest pain Treatment/Intervention: None Reason test stopped: protocol completed After recovery IV was: Discontinued via X-ray tech and No redness or edema Patient to return to [place]. Med at : 13:15 Patient discharged: Home Patient's Condition upon discharge was: stable Comments: During test BP 179/64 & HR 98.  Recovery BP 153/66 & HR 75.  Symptoms resolved in recovery. Continues to have headache. [name] T

## 2012-09-13 ENCOUNTER — Encounter: Payer: Self-pay | Admitting: Vascular Surgery

## 2012-09-13 ENCOUNTER — Encounter: Payer: Self-pay | Admitting: *Deleted

## 2012-09-14 ENCOUNTER — Encounter (INDEPENDENT_AMBULATORY_CARE_PROVIDER_SITE_OTHER): Payer: Medicare PPO | Admitting: Vascular Surgery

## 2012-09-14 ENCOUNTER — Ambulatory Visit (INDEPENDENT_AMBULATORY_CARE_PROVIDER_SITE_OTHER): Payer: Medicare PPO | Admitting: Vascular Surgery

## 2012-09-14 ENCOUNTER — Encounter: Payer: Self-pay | Admitting: Vascular Surgery

## 2012-09-14 VITALS — BP 131/53 | HR 72 | Resp 16 | Ht 66.0 in | Wt 225.0 lb

## 2012-09-14 DIAGNOSIS — I770 Arteriovenous fistula, acquired: Secondary | ICD-10-CM

## 2012-09-14 DIAGNOSIS — Z4931 Encounter for adequacy testing for hemodialysis: Secondary | ICD-10-CM

## 2012-09-14 DIAGNOSIS — N186 End stage renal disease: Secondary | ICD-10-CM

## 2012-09-14 NOTE — Progress Notes (Signed)
Subjective:     Patient ID: [name], female   DOB: 1950-04-30, 62 y.o.   MRN: [id number]  HPI this 62 year old female had left brachial cephalic AV fistula created by me on 07/29/2012. Patient is not yet on hemodialysis. She denies pain or numbness in the left hand. She has had no chills and fever.   Review of Systems     Objective:   Physical Exam BP 131/53  Pulse 72  Resp 16  Ht 5\' 6"  (1.676 m)  Wt 225 lb (102.059 kg)  BMI 36.33 kg/m2  General well-developed well-nourished female in no apparent stress alert and oriented x3 Left upper extremity with excellent pulse and palpable thrill in brachial-cephalic upper arm fistula. Well-healed antecubital wound. Previous incision for radial cephalic fistula has mild erythema at proximal aspect but no purulence. 2+ radial pulse palpable with left hand well perfused.  Today and ordered a duplex scan of the left brachial to cephalic AV fistula. The fistula is widely patent. There is some turbulent flow throughout and one small branch in the mid upper arm which is probably not significant.     Assessment:     Nicely functioning left brachial-cephalic AV fistula maturing well    Plan:     Okay to use fistula after 10/29/2012 Return to see me on a when necessary basis

## 2012-09-15 ENCOUNTER — Encounter (HOSPITAL_COMMUNITY): Payer: Medicare PPO

## 2012-09-30 ENCOUNTER — Encounter: Payer: Self-pay | Admitting: Family Medicine

## 2012-09-30 ENCOUNTER — Ambulatory Visit (INDEPENDENT_AMBULATORY_CARE_PROVIDER_SITE_OTHER): Payer: Medicare HMO | Admitting: Family Medicine

## 2012-09-30 VITALS — BP 114/57 | Temp 98.0°F | Resp 73 | Ht 66.0 in | Wt 230.2 lb

## 2012-09-30 DIAGNOSIS — I10 Essential (primary) hypertension: Secondary | ICD-10-CM

## 2012-09-30 DIAGNOSIS — L408 Other psoriasis: Secondary | ICD-10-CM

## 2012-09-30 DIAGNOSIS — Z124 Encounter for screening for malignant neoplasm of cervix: Secondary | ICD-10-CM

## 2012-09-30 DIAGNOSIS — E1142 Type 2 diabetes mellitus with diabetic polyneuropathy: Secondary | ICD-10-CM

## 2012-09-30 DIAGNOSIS — N184 Chronic kidney disease, stage 4 (severe): Secondary | ICD-10-CM

## 2012-09-30 DIAGNOSIS — R7989 Other specified abnormal findings of blood chemistry: Secondary | ICD-10-CM

## 2012-09-30 DIAGNOSIS — IMO0002 Reserved for concepts with insufficient information to code with codable children: Secondary | ICD-10-CM

## 2012-09-30 DIAGNOSIS — Z01419 Encounter for gynecological examination (general) (routine) without abnormal findings: Secondary | ICD-10-CM

## 2012-09-30 DIAGNOSIS — E669 Obesity, unspecified: Secondary | ICD-10-CM

## 2012-09-30 DIAGNOSIS — E785 Hyperlipidemia, unspecified: Secondary | ICD-10-CM

## 2012-09-30 DIAGNOSIS — E1165 Type 2 diabetes mellitus with hyperglycemia: Secondary | ICD-10-CM

## 2012-09-30 DIAGNOSIS — R635 Abnormal weight gain: Secondary | ICD-10-CM

## 2012-09-30 DIAGNOSIS — G2581 Restless legs syndrome: Secondary | ICD-10-CM

## 2012-09-30 DIAGNOSIS — E1149 Type 2 diabetes mellitus with other diabetic neurological complication: Secondary | ICD-10-CM

## 2012-09-30 DIAGNOSIS — D649 Anemia, unspecified: Secondary | ICD-10-CM

## 2012-09-30 DIAGNOSIS — E039 Hypothyroidism, unspecified: Secondary | ICD-10-CM

## 2012-09-30 DIAGNOSIS — L409 Psoriasis, unspecified: Secondary | ICD-10-CM

## 2012-09-30 DIAGNOSIS — E559 Vitamin D deficiency, unspecified: Secondary | ICD-10-CM

## 2012-09-30 DIAGNOSIS — E790 Hyperuricemia without signs of inflammatory arthritis and tophaceous disease: Secondary | ICD-10-CM

## 2012-09-30 LAB — POCT CBC
Granulocyte percent: 70.9 %G (ref 37–80)
HCT, POC: 34.2 % — AB (ref 37.7–47.9)
Hemoglobin: 11.8 g/dL — AB (ref 12.2–16.2)
Lymph, poc: 2.2 (ref 0.6–3.4)
MCH, POC: 28.9 pg (ref 27–31.2)
MCHC: 34.5 g/dL (ref 31.8–35.4)
MCV: 83.8 fL (ref 80–97)
MPV: 7.2 fL (ref 0–99.8)
POC Granulocyte: 6.8 (ref 2–6.9)
POC LYMPH PERCENT: 22.9 %L (ref 10–50)
Platelet Count, POC: 310 10*3/uL (ref 142–424)
RBC: 4.1 M/uL (ref 4.04–5.48)
RDW, POC: 14.8 %
WBC: 9.6 10*3/uL (ref 4.6–10.2)

## 2012-09-30 LAB — BASIC METABOLIC PANEL WITH GFR
BUN: 27 mg/dL — ABNORMAL HIGH (ref 6–23)
CO2: 23 mEq/L (ref 19–32)
Calcium: 8.9 mg/dL (ref 8.4–10.5)
Chloride: 101 mEq/L (ref 96–112)
Creat: 1.84 mg/dL — ABNORMAL HIGH (ref 0.50–1.10)
GFR, Est African American: 33 mL/min — ABNORMAL LOW
GFR, Est Non African American: 29 mL/min — ABNORMAL LOW
Glucose, Bld: 280 mg/dL — ABNORMAL HIGH (ref 70–99)
Potassium: 4.1 mEq/L (ref 3.5–5.3)
Sodium: 132 mEq/L — ABNORMAL LOW (ref 135–145)

## 2012-09-30 MED ORDER — ALLOPURINOL 100 MG PO TABS: 100.0000 mg | ORAL_TABLET | Freq: Every morning | ORAL | Status: DC

## 2012-09-30 MED ORDER — ROPINIROLE HCL 1 MG PO TABS: 1.0000 mg | ORAL_TABLET | Freq: Every day | ORAL | Status: DC

## 2012-09-30 MED ORDER — ERGOCALCIFEROL 1.25 MG (50000 UT) PO CAPS: 50000.0000 [IU] | ORAL_CAPSULE | ORAL | Status: DC

## 2012-09-30 NOTE — Patient Instructions (Addendum)
      Dr Paula Libra Recommendations  Diet and Exercise discussed with patient.  For nutrition information, I recommend books:  1).Eat to Live by Dr Excell Seltzer. 2).Prevent and Reverse Heart Disease by Dr Karl Luke. 3) Dr Janene Harvey Book:  Program to Reverse Diabetes  Exercise recommendations are:  If unable to walk, then the patient can exercise in a chair 3 times a day. By flapping arms like a bird gently and raising legs outwards to the front.  If ambulatory, the patient can go for walks for 30 minutes 3 times a week. Then increase the intensity and duration as tolerated.  Goal is to try to attain exercise frequency to 5 times a week.  If applicable: Best to perform resistance exercises (machines or weights) 2 days a week and cardio type exercises 3 days per week.

## 2012-09-30 NOTE — Progress Notes (Signed)
Patient ID: [name], female   DOB: May 23, 1950, 62 y.o.   MRN: [id number] SUBJECTIVE: CC: Chief Complaint  Patient presents with  . Follow-up    2 month follow up ? Pap  has had partial hystrectomy has had mammogram and has seen dr Lattie Haw    HPI: As above. Here mainly for GYN exam.  Past Medical History  Diagnosis Date  . Chest pain 2003    ?h/o MI, syncope; 2003 stress nuclear-inferoseptal scar, EF-54%; 2007-MI ruled out, echocardiogram-normal LV size, wall thickness and function  . RLS (restless legs syndrome)     sleep study in 2010: No apnea; moderate periodic movements  . GERD (gastroesophageal reflux disease)     H/o upper GI bleed  . GI bleeding   . Obese   . Gout   . Hypothyroidism   . Low back pain   . Cellulitis     Neck  . Diabetes mellitus 1998    A1c-7.1 in 2010, 6.7 in 2013  . Hyperlipidemia     Fasting lipids 2013:158, 152, 35, 93  . Hypertension 2002  . Anemia   . Hyperkalemia   . Irregular heart beat   . PONV (postoperative nausea and vomiting)   . Myocardial infarction   . Anginal pain     last cp few months ago  . CAD (coronary artery disease)     dr. Lattie Haw ([facility]na Hoff)  . Depression     Insomnia  . Chronic kidney disease, stage III (moderate)     Creat-1.9 in 04/2008, 2.65 in 02/2010(noHD  . Headache(784.0)   . Vitiligo   . H/O hiatal hernia   . Arthritis   . Cancer     HX SKIN CANCER  . Renal insufficiency    Past Surgical History  Procedure Laterality Date  . Appendectomy    . Carpal tunnel release  1992, 1993, 2011    Right in 2011; previously bilateral  . Tubal ligation  1976  . Partial hysterectomy  1985    Vaginal  . Shoulder surgery  1997    Left  . Incise and drain abcess  2010    Neck-MRSA  . Colonoscopy  02/2002, 07/2009    few diverticula, otherwise negative  . Foot surgery  1984    Bilateral heel spurs  . Abdominal hysterectomy    . Shoulder surgery      RIGHT  ADJUSTED   . Av fistula placement Left  07/16/2012    Procedure: ARTERIOVENOUS (AV) FISTULA CREATION;  Surgeon: Mal Misty, MD;  Location: [facility];  Service: Vascular;  Laterality: Left;  . Av fistula placement Left 07/29/2012    Procedure: ARTERIOVENOUS (AV) FISTULA CREATION;  Surgeon: Mal Misty, MD;  Location: [facility];  Service: Vascular;  Laterality: Left;  Left Brachial-Cephalic Arteriovenous Fistula Creation  . Colonoscopy with esophagogastroduodenoscopy (egd) N/A 08/27/2012    Procedure: COLONOSCOPY WITH ESOPHAGOGASTRODUODENOSCOPY (EGD);  Surgeon: Daneil Dolin, MD;  [facility];  Service: Endoscopy;  Laterality: N/A;  9:45   History   Social History  . Marital Status: Divorced    Spouse Name: N/A    Number of Children: 3  . Years of Education: N/A   Occupational History  . Disabled     previously Nurse, adult   Social History Main Topics  . Smoking status: Never Smoker   . Smokeless tobacco: Never Used  . Alcohol Use: No  . Drug Use: No  . Sexually Active: No   Other Topics Concern  .  Not on file   Social History Narrative   No regular exercise   Family History  Problem Relation Age of Onset  . Emphysema Mother   . COPD Mother   . Colon cancer Father   . Diabetes Father   . Cancer Father     colon  . Hypertension Father   . Congestive Heart Failure Maternal Grandmother   . Cancer Paternal Grandmother    Current Outpatient Prescriptions on File Prior to Visit  Medication Sig Dispense Refill  . amLODipine (NORVASC) 5 MG tablet Take 1 tablet (5 mg total) by mouth daily.  90 tablet  3  . carvedilol (COREG) 25 MG tablet Take 1 tablet (25 mg total) by mouth 2 (two) times daily with a meal.  60 tablet  6  . citalopram (CELEXA) 40 MG tablet Take 1 tablet (40 mg total) by mouth at bedtime.  30 tablet  5  . furosemide (LASIX) 40 MG tablet Take 1.5 tablets (60 mg total) by mouth daily.  45 tablet  6  . gabapentin (NEURONTIN) 800 MG tablet Take 3 tablets (2,400 mg total) by mouth at bedtime.  90  tablet  3  . insulin aspart (NOVOLOG FLEXPEN) 100 UNIT/ML injection Inject 40 Units into the skin 3 (three) times daily before meals.       . Insulin Glargine (LANTUS SOLOSTAR) 100 UNIT/ML SOPN Inject 41-43 Units into the skin 2 (two) times daily. 41 units in the morning and 43 units at night      . KLOR-CON M10 10 MEQ tablet TAKE ONE TABLET BY MOUTH TWICE DAILY  30 tablet  5  . levothyroxine (SYNTHROID, LEVOTHROID) 50 MCG tablet Take 50 mcg by mouth every evening.      Marland Kitchen lisinopril (PRINIVIL,ZESTRIL) 5 MG tablet Take 1 tablet (5 mg total) by mouth daily.  90 tablet  1  . omeprazole (PRILOSEC) 20 MG capsule Take 1 capsule (20 mg total) by mouth every morning.  30 capsule  2  . pravastatin (PRAVACHOL) 40 MG tablet Take 1 tablet (40 mg total) by mouth at bedtime.  30 tablet  2  . nitroGLYCERIN (NITROSTAT) 0.4 MG SL tablet Place 1 tablet (0.4 mg total) under the tongue every 5 (five) minutes as needed for chest pain.  50 tablet  0  . polyethylene glycol-electrolytes (TRILYTE) 420 G solution Take 4,000 mLs by mouth as directed.  4000 mL  0   No current facility-administered medications on file prior to visit.   Allergies  Allergen Reactions  . Latex Shortness Of Breath and Swelling    Facial swelling  . Vancomycin Hcl Swelling    REACTION: facial swelling  . Quinapril Hcl Swelling and Rash    Arm swelling    There is no immunization history on file for this patient. Prior to Admission medications   Medication Sig Start Date End Date Taking? Authorizing Provider  allopurinol (ZYLOPRIM) 100 MG tablet Take 1 tablet (100 mg total) by mouth every morning. 09/30/12  Yes Vernie Shanks, MD  amLODipine (NORVASC) 5 MG tablet Take 1 tablet (5 mg total) by mouth daily. 08/25/12  Yes Yehuda Savannah, MD  carvedilol (COREG) 25 MG tablet Take 1 tablet (25 mg total) by mouth 2 (two) times daily with a meal. 09/06/12  Yes Yehuda Savannah, MD  citalopram (CELEXA) 40 MG tablet Take 1 tablet (40 mg total) by  mouth at bedtime. 09/07/12  Yes Vernie Shanks, MD  ergocalciferol (VITAMIN D2) 50000 UNITS capsule Take  1 capsule (50,000 Units total) by mouth once a week. Monday 09/30/12  Yes Vernie Shanks, MD  furosemide (LASIX) 40 MG tablet Take 1.5 tablets (60 mg total) by mouth daily. 09/06/12  Yes Yehuda Savannah, MD  gabapentin (NEURONTIN) 800 MG tablet Take 3 tablets (2,400 mg total) by mouth at bedtime. 09/07/12  Yes Vernie Shanks, MD  insulin aspart (NOVOLOG FLEXPEN) 100 UNIT/ML injection Inject 40 Units into the skin 3 (three) times daily before meals.    Yes Historical Provider, MD  Insulin Glargine (LANTUS SOLOSTAR) 100 UNIT/ML SOPN Inject 41-43 Units into the skin 2 (two) times daily. 41 units in the morning and 43 units at night   Yes Historical Provider, MD  KLOR-CON M10 10 MEQ tablet TAKE ONE TABLET BY MOUTH TWICE DAILY 08/23/12  Yes Vernie Shanks, MD  levothyroxine (SYNTHROID, LEVOTHROID) 50 MCG tablet Take 50 mcg by mouth every evening.   Yes Historical Provider, MD  lisinopril (PRINIVIL,ZESTRIL) 5 MG tablet Take 1 tablet (5 mg total) by mouth daily. 09/06/12  Yes Yehuda Savannah, MD  omeprazole (PRILOSEC) 20 MG capsule Take 1 capsule (20 mg total) by mouth every morning. 07/19/12  Yes Vernie Shanks, MD  pravastatin (PRAVACHOL) 40 MG tablet Take 1 tablet (40 mg total) by mouth at bedtime. 07/19/12  Yes Vernie Shanks, MD  rOPINIRole (REQUIP) 1 MG tablet Take 1 tablet (1 mg total) by mouth at bedtime. 09/30/12  Yes Vernie Shanks, MD  nitroGLYCERIN (NITROSTAT) 0.4 MG SL tablet Place 1 tablet (0.4 mg total) under the tongue every 5 (five) minutes as needed for chest pain. 05/24/12   Yehuda Savannah, MD  polyethylene glycol-electrolytes (TRILYTE) 420 G solution Take 4,000 mLs by mouth as directed. 08/23/12   Daneil Dolin, MD     ROS: As above in the HPI. All other systems are stable or negative.  OBJECTIVE: APPEARANCE:  Patient in no acute distress.The patient appeared well nourished and  normally developed. Acyanotic. Waist: VITAL SIGNS:BP 114/57  Temp(Src) 98 F (36.7 C) (Oral)  Resp 73  Ht 5\' 6"  (1.676 m)  Wt 230 lb 3.2 oz (104.418 kg)  BMI 37.17 kg/m2  Obese WF  SKIN: warm and  Dry without overt rashes, There were tattoos on her right upper extremity and her upper back HEAD and Neck: without JVD, Head and scalp: normal Eyes:No scleral icterus. Fundi normal, eye movements normal. Ears: Auricle normal, canal normal, Tympanic membranes normal, insufflation normal. Nose: normal Throat: normal Neck & thyroid: normal  CHEST & LUNGS: Chest wall: normal Lungs: Clear  Breast Exam:  Appearance:  Skin : normal Areolas normal Nipples normal Dimples: Absent/ present  Palpation:Normal No Masses or Lumps felt/found.  Lymph Nodes: Axillary: Normal  CVS: Reveals the PMI to be normally located. Regular rhythm, First and Second Heart sounds are normal,  absence of murmurs, rubs or gallops. Peripheral vasculature: Radial pulses: normal Dorsal pedis pulses: normal Posterior pulses: normal  ABDOMEN:  Appearance: Obese Benign, no organomegaly, no masses, no Abdominal Aortic enlargement. No Guarding , no rebound. No Bruits. Bowel sounds: normal  RECTAL: N/A GU: PELVIC EXAM:  EXTERNAL Vulva:: Normal  SPECULUM: Vagina: Normal Cervix: absent  BIMANUAL: CMT: Negative Adnexae: Normal      No  Masses Uterus: absent Rectovaginal: Normal Hemoccult: Negative      EXTREMETIES: nonedematous. Both Femoral and Pedal pulses are normal.   NEUROLOGIC: oriented to time,place and person; nonfocal.  ASSESSMENT: Encounter for routine gynecological examination - Plan:  Pap IG w/ reflex to HPV when ASC-U, CANCELED: Cytology - PAP  Anemia, normocytic normochromic - Plan: POCT CBC  DM type 2, uncontrolled, with neuropathy  Obesity  Psoriasis  Hypothyroidism  Hypertension  Hyperlipidemia  Chronic kidney disease, stage IV (severe) - Plan: BASIC  METABOLIC PANEL WITH GFR  Unspecified vitamin D deficiency - Plan: ergocalciferol (VITAMIN D2) 50000 UNITS capsule, Vitamin D 25 hydroxy  Hyperuricemia - Plan: allopurinol (ZYLOPRIM) 100 MG tablet, Uric acid  RLS (restless legs syndrome) - Plan: rOPINIRole (REQUIP) 1 MG tablet, Ferritin    PLAN: Orders Placed This Encounter  Procedures  . BASIC METABOLIC PANEL WITH GFR  . Ferritin  . Uric acid  . Vitamin D 25 hydroxy  . POCT CBC   Meds ordered this encounter  Medications  . rOPINIRole (REQUIP) 1 MG tablet    Sig: Take 1 tablet (1 mg total) by mouth at bedtime.    Dispense:  30 tablet    Refill:  11  . allopurinol (ZYLOPRIM) 100 MG tablet    Sig: Take 1 tablet (100 mg total) by mouth every morning.    Dispense:  30 tablet    Refill:  11  . ergocalciferol (VITAMIN D2) 50000 UNITS capsule    Sig: Take 1 capsule (50,000 Units total) by mouth once a week. Monday    Dispense:  4 capsule    Refill:  3   Results for orders placed in visit on 09/30/12  POCT CBC      Result Value Range   WBC 9.6  4.6 - 10.2 K/uL   Lymph, poc 2.2  0.6 - 3.4   POC LYMPH PERCENT 22.9  10 - 50 %L   POC Granulocyte 6.8  2 - 6.9   Granulocyte percent 70.9  37 - 80 %G   RBC 4.1  4.04 - 5.48 M/uL   Hemoglobin 11.8 (*) 12.2 - 16.2 g/dL   HCT, POC 34.2 (*) 37.7 - 47.9 %   MCV 83.8  80 - 97 fL   MCH, POC 28.9  27 - 31.2 pg   MCHC 34.5  31.8 - 35.4 g/dL   RDW, POC 14.8     Platelet Count, POC 310.0  142 - 424 K/uL   MPV 7.2  0 - 99.8 fL        Dr Paula Libra Recommendations  Diet and Exercise discussed with patient.  For nutrition information, I recommend books:  1).Eat to Live by Dr Excell Seltzer. 2).Prevent and Reverse Heart Disease by Dr Karl Luke. 3) Dr Janene Harvey Book:  Program to Reverse Diabetes  Exercise recommendations are:  If unable to walk, then the patient can exercise in a chair 3 times a day. By flapping arms like a bird gently and raising legs outwards to the  front.  If ambulatory, the patient can go for walks for 30 minutes 3 times a week. Then increase the intensity and duration as tolerated.  Goal is to try to attain exercise frequency to 5 times a week.  If applicable: Best to perform resistance exercises (machines or weights) 2 days a week and cardio type exercises 3 days per week.  Return in about 2 months (around 12/01/2012) for Recheck medical problems.  Nam Vossler P. Jacelyn Grip, M.D.

## 2012-10-01 LAB — FERRITIN: Ferritin: 34 ng/mL (ref 10–291)

## 2012-10-01 LAB — URIC ACID: Uric Acid, Serum: 5.9 mg/dL (ref 2.4–6.0)

## 2012-10-01 LAB — VITAMIN D 25 HYDROXY (VIT D DEFICIENCY, FRACTURES): Vit D, 25-Hydroxy: 49 ng/mL (ref 30–89)

## 2012-10-01 LAB — PAP IG W/ RFLX HPV ASCU

## 2012-10-03 ENCOUNTER — Emergency Department (HOSPITAL_COMMUNITY)
Admission: EM | Admit: 2012-10-03 | Discharge: 2012-10-03 | Disposition: A | Discharge status: Home / Self Care | Payer: Medicare PPO | Attending: Emergency Medicine | Admitting: Emergency Medicine

## 2012-10-03 ENCOUNTER — Encounter (HOSPITAL_COMMUNITY): Payer: Self-pay | Admitting: *Deleted

## 2012-10-03 DIAGNOSIS — K219 Gastro-esophageal reflux disease without esophagitis: Secondary | ICD-10-CM | POA: Insufficient documentation

## 2012-10-03 DIAGNOSIS — E119 Type 2 diabetes mellitus without complications: Secondary | ICD-10-CM | POA: Insufficient documentation

## 2012-10-03 DIAGNOSIS — I129 Hypertensive chronic kidney disease with stage 1 through stage 4 chronic kidney disease, or unspecified chronic kidney disease: Secondary | ICD-10-CM | POA: Insufficient documentation

## 2012-10-03 DIAGNOSIS — Z91040 Latex allergy status: Secondary | ICD-10-CM | POA: Insufficient documentation

## 2012-10-03 DIAGNOSIS — Z794 Long term (current) use of insulin: Secondary | ICD-10-CM | POA: Insufficient documentation

## 2012-10-03 DIAGNOSIS — F329 Major depressive disorder, single episode, unspecified: Secondary | ICD-10-CM | POA: Insufficient documentation

## 2012-10-03 DIAGNOSIS — Z862 Personal history of diseases of the blood and blood-forming organs and certain disorders involving the immune mechanism: Secondary | ICD-10-CM | POA: Insufficient documentation

## 2012-10-03 DIAGNOSIS — Y929 Unspecified place or not applicable: Secondary | ICD-10-CM | POA: Insufficient documentation

## 2012-10-03 DIAGNOSIS — Y9389 Activity, other specified: Secondary | ICD-10-CM | POA: Insufficient documentation

## 2012-10-03 DIAGNOSIS — Z85828 Personal history of other malignant neoplasm of skin: Secondary | ICD-10-CM | POA: Insufficient documentation

## 2012-10-03 DIAGNOSIS — Z8719 Personal history of other diseases of the digestive system: Secondary | ICD-10-CM | POA: Insufficient documentation

## 2012-10-03 DIAGNOSIS — F3289 Other specified depressive episodes: Secondary | ICD-10-CM | POA: Insufficient documentation

## 2012-10-03 DIAGNOSIS — S61412A Laceration without foreign body of left hand, initial encounter: Secondary | ICD-10-CM

## 2012-10-03 DIAGNOSIS — E039 Hypothyroidism, unspecified: Secondary | ICD-10-CM | POA: Insufficient documentation

## 2012-10-03 DIAGNOSIS — S61409A Unspecified open wound of unspecified hand, initial encounter: Secondary | ICD-10-CM | POA: Insufficient documentation

## 2012-10-03 DIAGNOSIS — I252 Old myocardial infarction: Secondary | ICD-10-CM | POA: Insufficient documentation

## 2012-10-03 DIAGNOSIS — Z23 Encounter for immunization: Secondary | ICD-10-CM | POA: Insufficient documentation

## 2012-10-03 DIAGNOSIS — Z79899 Other long term (current) drug therapy: Secondary | ICD-10-CM | POA: Insufficient documentation

## 2012-10-03 DIAGNOSIS — W260XXA Contact with knife, initial encounter: Secondary | ICD-10-CM | POA: Insufficient documentation

## 2012-10-03 DIAGNOSIS — N183 Chronic kidney disease, stage 3 unspecified: Secondary | ICD-10-CM | POA: Insufficient documentation

## 2012-10-03 DIAGNOSIS — E785 Hyperlipidemia, unspecified: Secondary | ICD-10-CM | POA: Insufficient documentation

## 2012-10-03 DIAGNOSIS — W261XXA Contact with sword or dagger, initial encounter: Secondary | ICD-10-CM | POA: Insufficient documentation

## 2012-10-03 DIAGNOSIS — Z8639 Personal history of other endocrine, nutritional and metabolic disease: Secondary | ICD-10-CM | POA: Insufficient documentation

## 2012-10-03 DIAGNOSIS — Z8679 Personal history of other diseases of the circulatory system: Secondary | ICD-10-CM | POA: Insufficient documentation

## 2012-10-03 DIAGNOSIS — Z872 Personal history of diseases of the skin and subcutaneous tissue: Secondary | ICD-10-CM | POA: Insufficient documentation

## 2012-10-03 DIAGNOSIS — G2581 Restless legs syndrome: Secondary | ICD-10-CM | POA: Insufficient documentation

## 2012-10-03 DIAGNOSIS — I2510 Atherosclerotic heart disease of native coronary artery without angina pectoris: Secondary | ICD-10-CM | POA: Insufficient documentation

## 2012-10-03 DIAGNOSIS — M109 Gout, unspecified: Secondary | ICD-10-CM | POA: Insufficient documentation

## 2012-10-03 DIAGNOSIS — E669 Obesity, unspecified: Secondary | ICD-10-CM | POA: Insufficient documentation

## 2012-10-03 MED ORDER — TETANUS-DIPHTH-ACELL PERTUSSIS 5-2.5-18.5 LF-MCG/0.5 IM SUSP
0.5000 mL | Freq: Once | INTRAMUSCULAR | Status: AC
  Administered 2012-10-03: 0.5 mL via INTRAMUSCULAR
  Filled 2012-10-03: qty 0.5

## 2012-10-03 MED ORDER — LIDOCAINE HCL (PF) 1 % IJ SOLN
5.0000 mL | Freq: Once | INTRAMUSCULAR | Status: DC
  Filled 2012-10-03: qty 5

## 2012-10-03 MED ORDER — ACETAMINOPHEN 325 MG PO TABS
650.0000 mg | ORAL_TABLET | Freq: Once | ORAL | Status: AC
  Administered 2012-10-03: 650 mg via ORAL
  Filled 2012-10-03: qty 2

## 2012-10-03 NOTE — ED Provider Notes (Signed)
History    CSN: [id number] Arrival date & time 10/03/12  1009  First MD Initiated Contact with Patient 10/03/12 1029     Chief Complaint  Patient presents with  . Extremity Laceration   (Consider location/radiation/quality/duration/timing/severity/associated sxs/prior Treatment) HPI Comments: [name] is a 62 y.o. female who presents to the Emergency Department complaining of laceration to the webspace between the left thumb and index finger.  States that she was using a butter knife to pry apart frozen sausage patties when the knife slipped.   She denies numbness to her fingers. Swelling or continued bleeding.  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  Unsure of last TdaP  Patient is a 62 y.o. female presenting with skin laceration. The history is provided by the patient.  Laceration Location:  Hand Hand laceration location:  L palm Length (cm):  1.5 Depth:  Cutaneous Quality: straight   Bleeding: controlled   Time since incident: Just prior to arrival. Injury mechanism: Butter knife. Pain details:    Quality:  Throbbing   Severity:  Mild   Timing:  Constant   Progression:  Unchanged Foreign body present:  No foreign bodies Relieved by:  Pressure Worsened by:  Movement Ineffective treatments:  None tried Tetanus status:  Unknown  Past Medical History  Diagnosis Date  . Chest pain 2003    ?h/o MI, syncope; 2003 stress nuclear-inferoseptal scar, EF-54%; 2007-MI ruled out, echocardiogram-normal LV size, wall thickness and function  . RLS (restless legs syndrome)     sleep study in 2010: No apnea; moderate periodic movements  . GERD (gastroesophageal reflux disease)     H/o upper GI bleed  . GI bleeding   . Obese   . Gout   . Hypothyroidism   . Low back pain   . Cellulitis     Neck  . Diabetes mellitus 1998    A1c-7.1 in 2010, 6.7 in 2013  . Hyperlipidemia     Fasting lipids 2013:158, 152, 35, 93  .  Hypertension 2002  . Anemia   . Hyperkalemia   . Irregular heart beat   . PONV (postoperative nausea and vomiting)   . Myocardial infarction   . Anginal pain     last cp few months ago  . CAD (coronary artery disease)     dr. Lattie Haw (Linna Hoff)  . Depression     Insomnia  . Chronic kidney disease, stage III (moderate)     Creat-1.9 in 04/2008, 2.65 in 02/2010(noHD  . Headache(784.0)   . Vitiligo   . H/O hiatal hernia   . Arthritis   . Cancer     HX SKIN CANCER  . Renal insufficiency    Past Surgical History  Procedure Laterality Date  . Appendectomy    . Carpal tunnel release  1992, 1993, 2011    Right in 2011; previously bilateral  . Tubal ligation  1976  . Partial hysterectomy  1985    Vaginal  . Shoulder surgery  1997    Left  . Incise and drain abcess  2010    Neck-MRSA  . Colonoscopy  02/2002, 07/2009    few diverticula, otherwise negative  . Foot surgery  1984    Bilateral heel spurs  . Abdominal hysterectomy    . Shoulder surgery      RIGHT  ADJUSTED   . Av fistula placement Left 07/16/2012    Procedure: ARTERIOVENOUS (AV) FISTULA CREATION;  Surgeon: Nelda Severe  Kellie Simmering, MD;  Location: [facility];  Service: Vascular;  Laterality: Left;  . Av fistula placement Left 07/29/2012    Procedure: ARTERIOVENOUS (AV) FISTULA CREATION;  Surgeon: Mal Misty, MD;  Location: [facility];  Service: Vascular;  Laterality: Left;  Left Brachial-Cephalic Arteriovenous Fistula Creation  . Colonoscopy with esophagogastroduodenoscopy (egd) N/A 08/27/2012    Procedure: COLONOSCOPY WITH ESOPHAGOGASTRODUODENOSCOPY (EGD);  Surgeon: Daneil Dolin, MD;  [facility];  Service: Endoscopy;  Laterality: N/A;  9:45   Family History  Problem Relation Age of Onset  . Emphysema Mother   . COPD Mother   . Colon cancer Father   . Diabetes Father   . Cancer Father     colon  . Hypertension Father   . Congestive Heart Failure Maternal Grandmother   . Cancer Paternal Grandmother    History    Substance Use Topics  . Smoking status: Never Smoker   . Smokeless tobacco: Never Used  . Alcohol Use: No   OB History   Grav Para Term Preterm Abortions TAB SAB Ect Mult Living                 Review of Systems  Constitutional: Negative for fever and chills.  Musculoskeletal: Negative for myalgias, back pain, joint swelling and arthralgias.  Skin: Positive for wound.       Laceration   Neurological: Negative for dizziness, weakness and numbness.  Hematological: Does not bruise/bleed easily.  All other systems reviewed and are negative.    Allergies  Latex; Vancomycin hcl; and Quinapril hcl  Home Medications   Current Outpatient Rx  Name  Route  Sig  Dispense  Refill  . allopurinol (ZYLOPRIM) 100 MG tablet   Oral   Take 1 tablet (100 mg total) by mouth every morning.   30 tablet   11   . amLODipine (NORVASC) 5 MG tablet   Oral   Take 1 tablet (5 mg total) by mouth daily.   90 tablet   3   . carvedilol (COREG) 25 MG tablet   Oral   Take 1 tablet (25 mg total) by mouth 2 (two) times daily with a meal.   60 tablet   6   . citalopram (CELEXA) 40 MG tablet   Oral   Take 1 tablet (40 mg total) by mouth at bedtime.   30 tablet   5   . ergocalciferol (VITAMIN D2) 50000 UNITS capsule   Oral   Take 1 capsule (50,000 Units total) by mouth once a week. Monday   4 capsule   3   . furosemide (LASIX) 40 MG tablet   Oral   Take 1.5 tablets (60 mg total) by mouth daily.   45 tablet   6   . gabapentin (NEURONTIN) 800 MG tablet   Oral   Take 3 tablets (2,400 mg total) by mouth at bedtime.   90 tablet   3   . insulin aspart (NOVOLOG FLEXPEN) 100 UNIT/ML injection   Subcutaneous   Inject 40 Units into the skin 3 (three) times daily before meals.          . Insulin Glargine (LANTUS SOLOSTAR) 100 UNIT/ML SOPN   Subcutaneous   Inject 41-43 Units into the skin 2 (two) times daily. 41 units in the morning and 43 units at night         . levothyroxine  (SYNTHROID, LEVOTHROID) 50 MCG tablet   Oral   Take 50 mcg by mouth every evening.         Marland Kitchen  lisinopril (PRINIVIL,ZESTRIL) 5 MG tablet   Oral   Take 1 tablet (5 mg total) by mouth daily.   90 tablet   1   . omeprazole (PRILOSEC) 20 MG capsule   Oral   Take 1 capsule (20 mg total) by mouth every morning.   30 capsule   2   . potassium chloride (K-DUR) 10 MEQ tablet   Oral   Take 10 mEq by mouth 2 (two) times daily.         . pravastatin (PRAVACHOL) 40 MG tablet   Oral   Take 1 tablet (40 mg total) by mouth at bedtime.   30 tablet   2   . rOPINIRole (REQUIP) 1 MG tablet   Oral   Take 1 tablet (1 mg total) by mouth at bedtime.   30 tablet   11   . nitroGLYCERIN (NITROSTAT) 0.4 MG SL tablet   Sublingual   Place 1 tablet (0.4 mg total) under the tongue every 5 (five) minutes as needed for chest pain.   50 tablet   0    BP 147/75  Pulse 71  Temp(Src) 98.4 F (36.9 C)  Resp 16  Ht 5\' 6"  (1.676 m)  Wt 232 lb (105.235 kg)  BMI 37.46 kg/m2  SpO2 98% Physical Exam  Nursing note and vitals reviewed. Constitutional: She is oriented to person, place, and time. She appears well-developed and well-nourished. No distress.  HENT:  Head: Normocephalic and atraumatic.  Cardiovascular: Normal rate, regular rhythm and normal heart sounds.   Pulmonary/Chest: Effort normal and breath sounds normal.  Musculoskeletal: She exhibits no edema and no tenderness.       Left hand: She exhibits tenderness and laceration. She exhibits normal range of motion, no bony tenderness, normal two-point discrimination, normal capillary refill, no deformity and no swelling. Normal sensation noted. Normal strength noted.       Hands: Neurological: She is alert and oriented to person, place, and time. She exhibits normal muscle tone. Coordination normal.  Skin: Laceration noted.  See MS exam    ED Course  Procedures (including critical care time) Labs Reviewed - No data to display   MDM     LACERATION REPAIR Performed by: Lyam Provencio L. Authorized by: Hale Bogus Consent: Verbal consent obtained. Risks and benefits: risks, benefits and alternatives were discussed Consent given by: patient Patient identity confirmed: provided demographic data Prepped and Draped in normal sterile fashion Wound explored  Laceration Location:first web space of the left hand Laceration Length: 1.5 cm  No Foreign Bodies seen or palpated  Anesthesia: local infiltration  Local anesthetic: lidocaine 1% w/o  epinephrine  Anesthetic total:3 ml  Irrigation method: syringe Amount of cleaning: standard  Skin closure: 4-0 prolene Number of sutures: 3  Technique: simple interrupted  Patient tolerance: Patient tolerated the procedure well with no immediate complications.   Pain improved. Bleeding controlled.  Patient has full ROM of the fingers, distal sensation intact.  Agrees to keep bandaged, sutures out in 10 days and to return here if needed  Rawlin Reaume L. Vanessa Harvey [place], PA-C 10/05/12 2159

## 2012-10-03 NOTE — ED Notes (Signed)
Patient with no complaints at this time. Respirations even and unlabored. Skin warm/dry. Discharge instructions reviewed with patient at this time. Patient given opportunity to voice concerns/ask questions. Patient discharged at this time and left Emergency Department with steady gait.   

## 2012-10-03 NOTE — ED Notes (Signed)
Suture cart at bedside 

## 2012-10-03 NOTE — ED Notes (Addendum)
Pt was slicing sausage patties with a butter knife this am, knife slipped hitting left hand area, pt has laceration noted to left hand, small amount of bleeding noted. , unsure of last tetanus.

## 2012-10-04 ENCOUNTER — Encounter: Payer: Self-pay | Admitting: Internal Medicine

## 2012-10-06 ENCOUNTER — Ambulatory Visit (INDEPENDENT_AMBULATORY_CARE_PROVIDER_SITE_OTHER): Payer: Medicaid Other | Admitting: Gastroenterology

## 2012-10-06 ENCOUNTER — Encounter: Payer: Self-pay | Admitting: Gastroenterology

## 2012-10-06 VITALS — BP 125/62 | HR 76 | Temp 98.4°F | Ht 66.0 in | Wt 232.0 lb

## 2012-10-06 DIAGNOSIS — R195 Other fecal abnormalities: Secondary | ICD-10-CM

## 2012-10-06 MED ORDER — FERROUS SULFATE 325 (65 FE) MG PO TABS: 325.0000 mg | ORAL_TABLET | Freq: Every day | ORAL | Status: DC

## 2012-10-06 NOTE — Patient Instructions (Addendum)
I have sent a prescription for iron to your pharmacy. Take 1 tablet daily with breakfast. This can make your stool look dark/black. Avoid constipation. Eat a high fiber diet.   We will send you a letter in about 3 months to remind you of lab work to be done.   We will see you back in 1 year or sooner if needed!  How to Increase Fiber in the Meal Plan for Diabetes Increasing fiber in the diet has many benefits including lowering blood cholesterol, helping to control blood glucose (sugar), preventing constipation, and aiding in weight management by helping you feel full longer. Start adding fiber to your diet slowly. A gradual substitution of high fiber foods for low fiber foods will allow the digestive tract to adjust. Most men under 74 years of age should aim to eat 38 g of fiber a day. Women should aim for 25 g. Over 77 years of age, most men need 30 g of fiber and most women need 21 g. Below are some suggestions for increasing fiber.  Try whole-wheat bread instead of white bread. Look for words high on the list of ingredients such as whole wheat, whole rye, or whole oats.  Try baked potato with skin instead of mashed potatoes.  Try a fresh apple with skin instead of applesauce.  Try a fresh orange instead of orange juice.  Try popcorn instead of potato chips.  Try bran cereal instead of corn flakes.  Try kidney, whole pinto, or garbanzo beans instead of bread.  Try whole-grain crackers instead of saltine crackers.  Try whole-wheat pasta instead of regular varieties.  While on a high fiber diet, drink enough water and fluids to keep your urine clear or pale yellow.  Eat a variety of high fiber foods such as fruits, vegetables, whole grains, nuts, and seeds.  Try to increase your intake of fiber by eating high fiber foods instead of taking fiber pills or supplements that contain small amounts of fiber. There can be additional benefits for long-term health and blood glucose control with  high fiber foods. Aim for 5 servings of fruit and vegetables per day. SOURCES OF FIBER The following list shows the average dietary fiber for types of food in the various food groups. Starch and Bread / Dietary Fiber (g)  Whole-grain breads, 1 slice / 2 g  Whole grain,  cup / 2 g  Whole-grain cereals,  cup / 3 g  Bran cereals,  to  cup / 8 g  Starchy vegetables,  cup / 3 g  Legumes (beans, peas, lentils),  cup / 8 g  Oatmeal,  cup / 2 g  Whole-wheat pasta,  cup / 2 g  Brown rice,  cup / 2 g  Barley,  cup / 3 g Meat and Meat Substitutes / Dietary Fiber (g) This group averages 0 grams of fiber. Exceptions are:  Nuts, seeds, 1 oz or  cup / 3 g  Chunky peanut butter, 2 tbs / 3 g Vegetables / Dietary Fiber (g)  Cooked vegetables,  to  cup / 2 to 3 g  Raw vegetables, 1 to 2 cups / 2 to 3 g Fruit / Dietary Fiber (g)  Raw or cooked fruit,  cup or 1 small, fresh piece / 2 g Milk / Dietary Fiber (g)  Milk, 1 cup or 8 oz / 0 g Fats and Oils / Dietary Fiber (g)  Fats and oils, 1 tsp / 0 g You can determine how much fiber you  are eating by reading the Nutrition Facts panel on the labels of the foods you eat. FIBER IN SPECIFIC FOODS Cereals / Dietary Fiber (g)  All Bran,  cup / 9 g  All Bran with Extra Fiber,  cup / 13 g  Bran Flakes,  cup / 4 g  Cheerios,  cup / 1.5 g  Corn Bran,  cup / 4 g  Corn Flakes,  cup / 0.75 g  Cracklin' Oat Bran,  cup / 4 g  Fiber One,  cup / 13 g  Grape Nuts, 3 tbs / 3 g  Grape Nuts Flakes,  cup / 3 g  Noodles,  cup, cooked / 0.5 g  Nutrigrain Wheat,  cup / 3.5 g  Oatmeal,  cup, cooked / 1.1 g  Pasta, white (macaroni, spaghetti),  cup, cooked / 0.5 g  Pasta, whole-wheat (macaroni, spaghetti),  cup, cooked / 2 g  [place],  cup, cooked / 3 g  Rice, wild,  cup, cooked / 0.5 g  Rice, brown,  cup, cooked / 1 g  Rice, white,  cup, cooked / 0.2 g  Shredded Wheat, bite-sized,  cup / 2  g  Total,  cup / 1.75 g  Wheat Chex,  cup / 2.5 g  [place]heatena,  cup, cooked / 4 g  Wheaties,  cup / 2.75 g Bread, Starchy Vegetables, and Dried Peas and Beans / Dietary Fiber (g)  Bagel, whole / 0.6 g  Baked beans in tomato sauce,  cup, cooked / 3 g  Bran muffin, 1 small / 2.5 g  Bread, cracked wheat, 1 slice / 2.5 g  Bread, pumpernickel, 1 slice / 2.5 g  Bread, white, 1 slice / 0.4 g  Bread, whole-wheat, 1 slice / 1.4 g  Corn,  cup, canned / 2.9 g  Kidney beans,  cup, cooked / 3.5 g  Lentils, cup, cooked / 3 g  Lima beans,  cup, cooked / 4 g  Navy beans,  cup, cooked / 4 g  Peas,  cup, cooked / 4 g  Popcorn, 3 cups popped, unbuttered / 3.5 g  Potato, baked (with skin), 1 small / 4 g  Potato, baked (without skin), 1 small / 2 g  Ry-Krisp, 4 crackers / 3 g  Saltine crackers, 6 squares / 0 g  Split peas,  cup, cooked / 2.5 g  Yams (sweet potato),  cup / 1.7 g Fruit / Dietary Fiber (g)  Apple, 1 small, fresh, with skin / 4 g  Apple juice,  cup / 0.4 g  Apricots, 4 medium, fresh / 4 g  Apricots, 7 halves, dried / 2 g  Banana,  medium / 1.2 g  Blueberries,  cup / 2 g  Cantaloupe, melon / 1.3 g  Cherries,  cup, canned / 1.4 g  Grapefruit,  medium / 1.6 g  Grapes, 15 small / 1.2 g  Grape juice,  cup / 0.5 g  Orange, 1 medium, fresh / 2 g  Orange juice,  cup / 0.5 g  Peach, 1 medium,fresh, with skin / 2 g  Pear, 1 medium, fresh, with skin / 4 g  Pineapple, cup, canned / 0.7 g  Plums, 2 whole / 2 g  Prunes, 3 whole / 1.5 g  Raspberries, 1 cup / 6 g  Strawberries, 1  cup / 4 g  Watermelon, 1  cup / 0.5 g Vegetables / Dietary Fiber (g)  Asparagus,  cup, cooked / 1 g  Beans, green and wax,  cup, cooked / 1.6 g  Beets,  cup, cooked / 1.8 g  Broccoli,  cup, cooked / 2.2 g  Brussels sprouts,  cup, cooked / 4 g  Cabbage,  cup, cooked / 2.5 g  Carrots,  cup, cooked / 2.3 g  Cauliflower,  cup, cooked  / 1.1 g  Celery, 1 cup, raw / 1.5 g  Cucumber, 1 cup, raw / 0.8 g  Green pepper,  cup sliced, cooked / 1.5 g  Lettuce, 1 cup, sliced / 0.9 g  Mushrooms, 1 cup sliced, raw / 1.8 g  Onion, 1 cup sliced, raw / 1.6 g  Spinach,  cup, cooked / 2.4 g  Tomato, 1 medium, fresh / 1.5 g  Tomato juice,  cup / 0 g  Zucchini,  cup, cooked / 1.8 g Document Released: 08/30/2001 Document Revised: 06/02/2011 Document Reviewed: 09/26/2008 [employer] Patient Information 2014 [place], [place].

## 2012-10-06 NOTE — ED Provider Notes (Signed)
Medical screening examination/treatment/procedure(s) were performed by non-physician practitioner and as supervising physician I was immediately available for consultation/collaboration.   Sharyon Cable, MD 10/06/12 [phone]

## 2012-10-06 NOTE — Progress Notes (Signed)
Cc PCP 

## 2012-10-06 NOTE — Assessment & Plan Note (Signed)
63 year old female found to have heme positive stool, very mild anemia likely multifactorial (chronic disease +/- early IDA?) and findings of oozing gastric hyperplastic polyp on recent EGD. Colonoscopy benign, with need for repeat screening in 2019 due to family history of colon cancer. Gastric polyp could very well be the culprit of heme positive stool; she has no overt signs of GI bleeding. Will hold off on capsule study at this time, as her anemia is likely multifactorial. Recommend repeat CBC, iron, and ferritin in 3 months. For now, will add iron daily. Physically return in 1 year.   As of note, dysphagia resolved after empiric dilation at time of EGD.

## 2012-10-06 NOTE — Progress Notes (Signed)
Referring Provider: Vernie Shanks, MD Primary Care Physician:  Anthoney Harada, MD Primary GI: Dr. Gala Romney   Chief Complaint  Patient presents with  . Follow-up    HPI:   [name] is a 62 year old female returning today in follow-up after TCS/EGD performed secondary to heme positive stool. Oozing gastric polyp noted, hyperplastic path. Empiric dilation performed at time of EGD. TCS with colonic diverticulosis; she will need repeat colonoscopy in 2019. Iron studies revealed low normal ferritin, low iron at 29. Likely multifactorial mixed anemia with chronic disease and possible early IDA.   Denies N/V, abdominal pain. No changes in bowel habits. No melena or hematochezia. Dysphagia resolved. No concerns.     Past Medical History  Diagnosis Date  . Chest pain 2003    ?h/o MI, syncope; 2003 stress nuclear-inferoseptal scar, EF-54%; 2007-MI ruled out, echocardiogram-normal LV size, wall thickness and function  . RLS (restless legs syndrome)     sleep study in 2010: No apnea; moderate periodic movements  . GERD (gastroesophageal reflux disease)     H/o upper GI bleed  . GI bleeding   . Obese   . Gout   . Hypothyroidism   . Low back pain   . Cellulitis     Neck  . Diabetes mellitus 1998    A1c-7.1 in 2010, 6.7 in 2013  . Hyperlipidemia     Fasting lipids 2013:158, 152, 35, 93  . Hypertension 2002  . Anemia   . Hyperkalemia   . Irregular heart beat   . PONV (postoperative nausea and vomiting)   . Myocardial infarction   . Anginal pain     last cp few months ago  . CAD (coronary artery disease)     dr. Lattie Haw ([facility])  . Depression     Insomnia  . Chronic kidney disease, stage III (moderate)     Creat-1.9 in 04/2008, 2.65 in 02/2010(noHD  . Headache(784.0)   . Vitiligo   . H/O hiatal hernia   . Arthritis   . Cancer     HX SKIN CANCER  . Renal insufficiency     Past Surgical History  Procedure Laterality Date  . Appendectomy    . Carpal tunnel release   1992, 1993, 2011    Right in 2011; previously bilateral  . Tubal ligation  1976  . Partial hysterectomy  1985    Vaginal  . Shoulder surgery  1997    Left  . Incise and drain abcess  2010    Neck-MRSA  . Colonoscopy  02/2002, 07/2009    few diverticula, otherwise negative  . Foot surgery  1984    Bilateral heel spurs  . Abdominal hysterectomy    . Shoulder surgery      RIGHT  ADJUSTED   . Av fistula placement Left 07/16/2012    Procedure: ARTERIOVENOUS (AV) FISTULA CREATION;  Surgeon: Mal Misty, MD;  Location: [facility];  Service: Vascular;  Laterality: Left;  . Av fistula placement Left 07/29/2012    Procedure: ARTERIOVENOUS (AV) FISTULA CREATION;  Surgeon: Mal Misty, MD;  Location: [facility];  Service: Vascular;  Laterality: Left;  Left Brachial-Cephalic Arteriovenous Fistula Creation  . Colonoscopy with esophagogastroduodenoscopy (egd) N/A 08/27/2012    RMR: Normal esophagus s/p 54 F dilator. Small hiatal hernia. Gastric polyp removed, oozing (hyperplastic), which could explain heme positive stool:Colonic diverticulosis    Current Outpatient Prescriptions  Medication Sig Dispense Refill  . allopurinol (ZYLOPRIM) 100 MG tablet Take 1 tablet (100 mg total) by  mouth every morning.  30 tablet  11  . amLODipine (NORVASC) 5 MG tablet Take 1 tablet (5 mg total) by mouth daily.  90 tablet  3  . carvedilol (COREG) 25 MG tablet Take 1 tablet (25 mg total) by mouth 2 (two) times daily with a meal.  60 tablet  6  . citalopram (CELEXA) 40 MG tablet Take 1 tablet (40 mg total) by mouth at bedtime.  30 tablet  5  . ergocalciferol (VITAMIN D2) 50000 UNITS capsule Take 1 capsule (50,000 Units total) by mouth once a week. Monday  4 capsule  3  . furosemide (LASIX) 40 MG tablet Take 1.5 tablets (60 mg total) by mouth daily.  45 tablet  6  . gabapentin (NEURONTIN) 800 MG tablet Take 3 tablets (2,400 mg total) by mouth at bedtime.  90 tablet  3  . insulin aspart (NOVOLOG FLEXPEN) 100 UNIT/ML injection  Inject 40 Units into the skin 3 (three) times daily before meals.       . Insulin Glargine (LANTUS SOLOSTAR) 100 UNIT/ML SOPN Inject 41-43 Units into the skin 2 (two) times daily. 41 units in the morning and 43 units at night      . levothyroxine (SYNTHROID, LEVOTHROID) 50 MCG tablet Take 50 mcg by mouth every evening.      Marland Kitchen lisinopril (PRINIVIL,ZESTRIL) 5 MG tablet Take 1 tablet (5 mg total) by mouth daily.  90 tablet  1  . nitroGLYCERIN (NITROSTAT) 0.4 MG SL tablet Place 1 tablet (0.4 mg total) under the tongue every 5 (five) minutes as needed for chest pain.  50 tablet  0  . omeprazole (PRILOSEC) 20 MG capsule Take 1 capsule (20 mg total) by mouth every morning.  30 capsule  2  . potassium chloride (K-DUR) 10 MEQ tablet Take 10 mEq by mouth 2 (two) times daily.      . pravastatin (PRAVACHOL) 40 MG tablet Take 1 tablet (40 mg total) by mouth at bedtime.  30 tablet  2  . rOPINIRole (REQUIP) 1 MG tablet Take 1 tablet (1 mg total) by mouth at bedtime.  30 tablet  11   No current facility-administered medications for this visit.    Allergies as of 10/06/2012 - Review Complete 10/06/2012  Allergen Reaction Noted  . Latex Shortness Of Breath and Swelling 01/22/2009  . Vancomycin hcl Swelling 01/22/2009  . Quinapril hcl Swelling and Rash     Family History  Problem Relation Age of Onset  . Emphysema Mother   . COPD Mother   . Colon cancer Father   . Diabetes Father   . Cancer Father     colon  . Hypertension Father   . Congestive Heart Failure Maternal Grandmother   . Cancer Paternal Grandmother     History   Social History  . Marital Status: Divorced    Spouse Name: N/A    Number of Children: 3  . Years of Education: N/A   Occupational History  . Disabled     previously Nurse, adult   Social History Main Topics  . Smoking status: Never Smoker   . Smokeless tobacco: Never Used  . Alcohol Use: No  . Drug Use: No  . Sexually Active: No   Other Topics Concern  . None    Social History Narrative   No regular exercise    Review of Systems: Negative unless mentioned in HPI  Physical Exam: BP 125/62  Pulse 76  Temp(Src) 98.4 F (36.9 C) (Oral)  Ht 5\' 6"  (1.676  m)  Wt 232 lb (105.235 kg)  BMI 37.46 kg/m2 General:   Alert and oriented. No distress noted. Pleasant and cooperative.  Head:  Normocephalic and atraumatic. Eyes:  Conjuctiva clear without scleral icterus. Neck:  Supple, without mass or thyromegaly. Heart:  S1, S2 present without murmurs, rubs, or gallops. Regular rate and rhythm. Abdomen:  +BS, soft, non-tender and non-distended. No rebound or guarding. No HSM or masses noted. Msk:  Symmetrical without gross deformities. Normal posture. Extremities:  Without edema. Neurologic:  Alert and  oriented x4;  grossly normal neurologically. Skin:  Intact without significant lesions or rashes. Cervical Nodes:  No significant cervical adenopathy. Psych:  Alert and cooperative. Normal mood and affect.  Lab Results  Component Value Date   WBC 9.6 09/30/2012   HGB 11.8* 09/30/2012   HCT 34.2* 09/30/2012   MCV 83.8 09/30/2012   PLT 367 05/22/2012   Lab Results  Component Value Date   IRON 39* 08/23/2012   TIBC 306 08/03/2009   FERRITIN 34 09/30/2012

## 2012-10-21 ENCOUNTER — Other Ambulatory Visit: Payer: Self-pay

## 2012-10-21 DIAGNOSIS — D649 Anemia, unspecified: Secondary | ICD-10-CM

## 2012-10-21 DIAGNOSIS — R195 Other fecal abnormalities: Secondary | ICD-10-CM

## 2012-11-09 ENCOUNTER — Other Ambulatory Visit: Payer: Self-pay

## 2012-11-09 MED ORDER — OMEPRAZOLE 20 MG PO CPDR: 20.0000 mg | DELAYED_RELEASE_CAPSULE | Freq: Every morning | ORAL | Status: DC

## 2012-11-09 MED ORDER — PRAVASTATIN SODIUM 40 MG PO TABS: 40.0000 mg | ORAL_TABLET | Freq: Every day | ORAL | Status: DC

## 2012-11-11 ENCOUNTER — Encounter: Payer: Self-pay | Admitting: *Deleted

## 2012-11-18 ENCOUNTER — Other Ambulatory Visit: Payer: Self-pay

## 2012-12-01 ENCOUNTER — Telehealth: Payer: Self-pay | Admitting: Vascular Surgery

## 2012-12-01 ENCOUNTER — Telehealth: Payer: Self-pay

## 2012-12-01 ENCOUNTER — Ambulatory Visit: Payer: Medicare HMO | Admitting: Family Medicine

## 2012-12-01 DIAGNOSIS — T82898A Other specified complication of vascular prosthetic devices, implants and grafts, initial encounter: Secondary | ICD-10-CM

## 2012-12-01 DIAGNOSIS — N186 End stage renal disease: Secondary | ICD-10-CM

## 2012-12-01 NOTE — Telephone Encounter (Signed)
Phone call from pt. to report that has approx. 3 " area on left arm AVF site that has "ballooned-up".  States she can feel a thrill at the AVF site, when questioned.   Denies any tenderness.  Denies any fever/ chills.  States "a little red at the incision", but no warmth noted.  Offered an appt.  Requests to schedule appt. 9/23, due to a planned trip.  Discussed w/ Dr. Scot Dock.  Advised to schedule for duplex of the access and office visit.

## 2012-12-01 NOTE — Telephone Encounter (Addendum)
Message copied by Doristine Section on Wed Dec 01, 2012  5:00 PM ------      Message from: Michaele Offer S      Created: Wed Dec 01, 2012  3:11 PM      Regarding: needs appt with Dr. Kellie Simmering 9/23      Contact: [phone]       Please schedule pt. for duplex of left arm access, and appt. with Dr. Kellie Simmering 12/14/12; c/o left arm AVF site ballooned-out; said she can't come before week of 9/22.   ------  notified patient of fu appt. on 12-13-12 2:30

## 2012-12-03 ENCOUNTER — Encounter: Payer: Self-pay | Admitting: Family Medicine

## 2012-12-03 ENCOUNTER — Ambulatory Visit (INDEPENDENT_AMBULATORY_CARE_PROVIDER_SITE_OTHER): Payer: Medicare HMO | Admitting: Family Medicine

## 2012-12-03 VITALS — BP 123/60 | HR 73 | Temp 98.6°F | Wt 227.2 lb

## 2012-12-03 DIAGNOSIS — M109 Gout, unspecified: Secondary | ICD-10-CM

## 2012-12-03 DIAGNOSIS — E1149 Type 2 diabetes mellitus with other diabetic neurological complication: Secondary | ICD-10-CM

## 2012-12-03 DIAGNOSIS — E785 Hyperlipidemia, unspecified: Secondary | ICD-10-CM

## 2012-12-03 DIAGNOSIS — I10 Essential (primary) hypertension: Secondary | ICD-10-CM

## 2012-12-03 DIAGNOSIS — E1142 Type 2 diabetes mellitus with diabetic polyneuropathy: Secondary | ICD-10-CM

## 2012-12-03 DIAGNOSIS — E039 Hypothyroidism, unspecified: Secondary | ICD-10-CM

## 2012-12-03 DIAGNOSIS — K219 Gastro-esophageal reflux disease without esophagitis: Secondary | ICD-10-CM

## 2012-12-03 DIAGNOSIS — D649 Anemia, unspecified: Secondary | ICD-10-CM

## 2012-12-03 DIAGNOSIS — G2581 Restless legs syndrome: Secondary | ICD-10-CM

## 2012-12-03 DIAGNOSIS — E1140 Type 2 diabetes mellitus with diabetic neuropathy, unspecified: Secondary | ICD-10-CM

## 2012-12-03 DIAGNOSIS — I770 Arteriovenous fistula, acquired: Secondary | ICD-10-CM

## 2012-12-03 DIAGNOSIS — IMO0002 Reserved for concepts with insufficient information to code with codable children: Secondary | ICD-10-CM

## 2012-12-03 LAB — POCT GLYCOSYLATED HEMOGLOBIN (HGB A1C): Hemoglobin A1C: 11.7

## 2012-12-03 NOTE — Progress Notes (Signed)
Patient ID: [name], female   DOB: 1950-07-02, 62 y.o.   MRN: [id number] SUBJECTIVE: CC: Chief Complaint  Patient presents with  . Follow-up    2 month follow up [place] instead of insulin ?    HPI: Patient is here for follow up of Diabetes Mellitus: Symptoms evaluated: Denies Nocturia ,Denies Urinary Frequency , denies Blurred vision ,deniesDizziness,denies.Dysuria,denies paresthesias, denies extremity pain or ulcers.Marland Kitchendenies chest pain. has had an annual eye exam. do check the feet. Does check CBGs. Average CBG:195 getting better Denies episodes of hypoglycemia. Does have an emergency hypoglycemic plan. admits toCompliance with medications. Denies Problems with medications.  Past Medical History  Diagnosis Date  . Chest pain 2003    ?h/o MI, syncope; 2003 stress nuclear-inferoseptal scar, EF-54%; 2007-MI ruled out, echocardiogram-normal LV size, wall thickness and function  . RLS (restless legs syndrome)     sleep study in 2010: No apnea; moderate periodic movements  . GERD (gastroesophageal reflux disease)     H/o upper GI bleed  . GI bleeding   . Obese   . Gout   . Hypothyroidism   . Low back pain   . Cellulitis     Neck  . Diabetes mellitus 1998    A1c-7.1 in 2010, 6.7 in 2013  . Hyperlipidemia     Fasting lipids 2013:158, 152, 35, 93  . Hypertension 2002  . Anemia   . Hyperkalemia   . Irregular heart beat   . PONV (postoperative nausea and vomiting)   . Myocardial infarction   . Anginal pain     last cp few months ago  . CAD (coronary artery disease)     dr. Lattie Haw (Linna Hoff)  . Depression     Insomnia  . Chronic kidney disease, stage III (moderate)     Creat-1.9 in 04/2008, 2.65 in 02/2010(noHD  . Headache(784.0)   . Vitiligo   . H/O hiatal hernia   . Arthritis   . Cancer     HX SKIN CANCER  . Renal insufficiency    Past Surgical History  Procedure Laterality Date  . Appendectomy    . Carpal tunnel release  1992, 1993, 2011    Right  in 2011; previously bilateral  . Tubal ligation  1976  . Partial hysterectomy  1985    Vaginal  . Shoulder surgery  1997    Left  . Incise and drain abcess  2010    Neck-MRSA  . Colonoscopy  02/2002, 07/2009    few diverticula, otherwise negative  . Foot surgery  1984    Bilateral heel spurs  . Abdominal hysterectomy    . Shoulder surgery      RIGHT  ADJUSTED   . Av fistula placement Left 07/16/2012    Procedure: ARTERIOVENOUS (AV) FISTULA CREATION;  Surgeon: Mal Misty, MD;  Location: [facility];  Service: Vascular;  Laterality: Left;  . Av fistula placement Left 07/29/2012    Procedure: ARTERIOVENOUS (AV) FISTULA CREATION;  Surgeon: Mal Misty, MD;  Location: [facility];  Service: Vascular;  Laterality: Left;  Left Brachial-Cephalic Arteriovenous Fistula Creation  . Colonoscopy with esophagogastroduodenoscopy (egd) N/A 08/27/2012    RMR: Normal esophagus s/p 54 F dilator. Small hiatal hernia. Gastric polyp removed, oozing (hyperplastic), which could explain heme positive stool:Colonic diverticulosis   History   Social History  . Marital Status: Divorced    Spouse Name: N/A    Number of Children: 3  . Years of Education: N/A   Occupational History  .  Disabled     previously Nurse, adult   Social History Main Topics  . Smoking status: Never Smoker   . Smokeless tobacco: Never Used  . Alcohol Use: No  . Drug Use: No  . Sexual Activity: No   Other Topics Concern  . Not on file   Social History Narrative   No regular exercise   Family History  Problem Relation Age of Onset  . Emphysema Mother   . COPD Mother   . Colon cancer Father   . Diabetes Father   . Cancer Father     colon  . Hypertension Father   . Congestive Heart Failure Maternal Grandmother   . Cancer Paternal Grandmother    Current Outpatient Prescriptions on File Prior to Visit  Medication Sig Dispense Refill  . allopurinol (ZYLOPRIM) 100 MG tablet Take 1 tablet (100 mg total) by mouth every morning.  30  tablet  11  . amLODipine (NORVASC) 5 MG tablet Take 1 tablet (5 mg total) by mouth daily.  90 tablet  3  . carvedilol (COREG) 25 MG tablet Take 1 tablet (25 mg total) by mouth 2 (two) times daily with a meal.  60 tablet  6  . citalopram (CELEXA) 40 MG tablet Take 1 tablet (40 mg total) by mouth at bedtime.  30 tablet  5  . ergocalciferol (VITAMIN D2) 50000 UNITS capsule Take 1 capsule (50,000 Units total) by mouth once a week. Monday  4 capsule  3  . ferrous sulfate 325 (65 FE) MG tablet Take 1 tablet (325 mg total) by mouth daily with breakfast.  30 tablet  3  . furosemide (LASIX) 40 MG tablet Take 1.5 tablets (60 mg total) by mouth daily.  45 tablet  6  . gabapentin (NEURONTIN) 800 MG tablet Take 3 tablets (2,400 mg total) by mouth at bedtime.  90 tablet  3  . insulin aspart (NOVOLOG FLEXPEN) 100 UNIT/ML injection Inject 40 Units into the skin 3 (three) times daily before meals.       . Insulin Glargine (LANTUS SOLOSTAR) 100 UNIT/ML SOPN Inject 41-43 Units into the skin 2 (two) times daily. 41 units in the morning and 43 units at night      . levothyroxine (SYNTHROID, LEVOTHROID) 50 MCG tablet Take 50 mcg by mouth every evening.      Marland Kitchen lisinopril (PRINIVIL,ZESTRIL) 5 MG tablet Take 1 tablet (5 mg total) by mouth daily.  90 tablet  1  . nitroGLYCERIN (NITROSTAT) 0.4 MG SL tablet Place 1 tablet (0.4 mg total) under the tongue every 5 (five) minutes as needed for chest pain.  50 tablet  0  . omeprazole (PRILOSEC) 20 MG capsule Take 1 capsule (20 mg total) by mouth every morning.  30 capsule  4  . pravastatin (PRAVACHOL) 40 MG tablet Take 1 tablet (40 mg total) by mouth at bedtime.  30 tablet  2  . rOPINIRole (REQUIP) 1 MG tablet Take 1 tablet (1 mg total) by mouth at bedtime.  30 tablet  11   No current facility-administered medications on file prior to visit.   Allergies  Allergen Reactions  . Latex Shortness Of Breath and Swelling    Facial swelling  . Vancomycin Hcl Swelling    REACTION:  facial swelling  . Quinapril Hcl Swelling and Rash    Arm swelling   Immunization History  Administered Date(s) Administered  . Tdap 10/03/2012   Prior to Admission medications   Medication Sig Start Date End Date Taking? Authorizing Provider  allopurinol (ZYLOPRIM) 100 MG tablet Take 1 tablet (100 mg total) by mouth every morning. 09/30/12  Yes Vernie Shanks, MD  amLODipine (NORVASC) 5 MG tablet Take 1 tablet (5 mg total) by mouth daily. 08/25/12  Yes Yehuda Savannah, MD  carvedilol (COREG) 25 MG tablet Take 1 tablet (25 mg total) by mouth 2 (two) times daily with a meal. 09/06/12  Yes Yehuda Savannah, MD  citalopram (CELEXA) 40 MG tablet Take 1 tablet (40 mg total) by mouth at bedtime. 09/07/12  Yes Vernie Shanks, MD  ergocalciferol (VITAMIN D2) 50000 UNITS capsule Take 1 capsule (50,000 Units total) by mouth once a week. Monday 09/30/12  Yes Vernie Shanks, MD  ferrous sulfate 325 (65 FE) MG tablet Take 1 tablet (325 mg total) by mouth daily with breakfast. 10/06/12  Yes Orvil Feil, NP  fluocinonide (LIDEX) 0.05 % external solution  11/16/12  Yes Historical Provider, MD  furosemide (LASIX) 40 MG tablet Take 1.5 tablets (60 mg total) by mouth daily. 09/06/12  Yes Yehuda Savannah, MD  gabapentin (NEURONTIN) 800 MG tablet Take 3 tablets (2,400 mg total) by mouth at bedtime. 09/07/12  Yes Vernie Shanks, MD  insulin aspart (NOVOLOG FLEXPEN) 100 UNIT/ML injection Inject 40 Units into the skin 3 (three) times daily before meals.    Yes Historical Provider, MD  Insulin Glargine (LANTUS SOLOSTAR) 100 UNIT/ML SOPN Inject 41-43 Units into the skin 2 (two) times daily. 41 units in the morning and 43 units at night   Yes Historical Provider, MD  KLOR-CON M10 10 MEQ tablet Take 10 mEq by mouth daily.  10/31/12  Yes Historical Provider, MD  levothyroxine (SYNTHROID, LEVOTHROID) 50 MCG tablet Take 50 mcg by mouth every evening.   Yes Historical Provider, MD  lisinopril (PRINIVIL,ZESTRIL) 5 MG tablet Take 1  tablet (5 mg total) by mouth daily. 09/06/12  Yes Yehuda Savannah, MD  nitroGLYCERIN (NITROSTAT) 0.4 MG SL tablet Place 1 tablet (0.4 mg total) under the tongue every 5 (five) minutes as needed for chest pain. 05/24/12  Yes Yehuda Savannah, MD  omeprazole (PRILOSEC) 20 MG capsule Take 1 capsule (20 mg total) by mouth every morning. 11/09/12  Yes Vernie Shanks, MD  pravastatin (PRAVACHOL) 40 MG tablet Take 1 tablet (40 mg total) by mouth at bedtime. 11/09/12  Yes Vernie Shanks, MD  rOPINIRole (REQUIP) 1 MG tablet Take 1 tablet (1 mg total) by mouth at bedtime. 09/30/12  Yes Vernie Shanks, MD    ROS: As above in the HPI. All other systems are stable or negative.  OBJECTIVE: APPEARANCE:  Patient in no acute distress.The patient appeared well nourished and normally developed. Acyanotic. Waist: VITAL SIGNS:BP 123/60  Pulse 73  Temp(Src) 98.6 F (37 C) (Oral)  Wt 227 lb 3.2 oz (103.057 kg)  BMI 36.69 kg/m2 Obese WF  SKIN: warm and  Dry without overt rashes, tattoos and scars  HEAD and Neck: without JVD, Head and scalp: normal Eyes:No scleral icterus. Fundi normal, eye movements normal. Ears: Auricle normal, canal normal, Tympanic membranes normal, insufflation normal. Nose: normal Throat: normal Neck & thyroid: normal  CHEST & LUNGS: Chest wall: normal Lungs: Clear  CVS: Reveals the PMI to be normally located. Regular rhythm, First and Second Heart sounds are normal,  absence of murmurs, rubs or gallops. Peripheral vasculature: Radial pulses: normal Dorsal pedis pulses: normal Posterior pulses: normal Left brachial shunt . Functional for planned dialysis  ABDOMEN:  Appearance: normal Benign, no  organomegaly, no masses, no Abdominal Aortic enlargement. No Guarding , no rebound. No Bruits. Bowel sounds: normal  RECTAL: N/A GU: N/A  EXTREMETIES: nonedematous.  MUSCULOSKELETAL:  Spine: normal Joints: intact  NEUROLOGIC: oriented to time,place and person;  nonfocal.   ASSESSMENT: A-V fistula  Anemia, normocytic normochromic  DM type 2, uncontrolled, with neuropathy - Plan: POCT glycosylated hemoglobin (Hb A1C), CMP14+EGFR, Fructosamine  Gastroesophageal reflux disease  Gout - Plan: Uric acid  Hyperlipidemia - Plan: CMP14+EGFR, NMR, lipoprofile  Hypertension - Plan: CMP14+EGFR  Hypothyroidism  RLS (restless legs syndrome)   PLAN: Orders Placed This Encounter  Procedures  . CMP14+EGFR  . Fructosamine  . Uric acid  . NMR, lipoprofile  . POCT glycosylated hemoglobin (Hb A1C)    Meds ordered this encounter  Medications  . fluocinonide (LIDEX) 0.05 % external solution    Sig:   . KLOR-CON M10 10 MEQ tablet    Sig: Take 10 mEq by mouth daily.     Medications Discontinued During This Encounter  Medication Reason  . potassium chloride (K-DUR) 10 MEQ tablet Duplicate        Dr Paula Libra Recommendations  For nutrition information, I recommend books:  1).Eat to Live by Dr Excell Seltzer. 2).Prevent and Reverse Heart Disease by Dr Karl Luke. 3) Dr Janene Harvey Book:  Program to Reverse Diabetes  Exercise recommendations are:  If unable to walk, then the patient can exercise in a chair 3 times a day. By flapping arms like a bird gently and raising legs outwards to the front.  If ambulatory, the patient can go for walks for 30 minutes 3 times a week. Then increase the intensity and duration as tolerated.  Goal is to try to attain exercise frequency to 5 times a week.  If applicable: Best to perform resistance exercises (machines or weights) 2 days a week and cardio type exercises 3 days per week.  Lifestyle changes.  Discussed with her her erroneous misconceptions of th etreatment of her DM and replacing insulin with Januvia alone based on marketing on the TV.  Follow up with Tammy or Sharyn Lull  Return in about 3 months (around 03/04/2013) for Recheck medical problems.  Lannie Yusuf P. Jacelyn Grip,  M.D.

## 2012-12-03 NOTE — Patient Instructions (Addendum)
      Dr Tamaya Pun's Recommendations  For nutrition information, I recommend books:  1).Eat to Live by Dr Joel Fuhrman. 2).Prevent and Reverse Heart Disease by Dr Caldwell Esselstyn. 3) Dr Neal Barnard's Book:  Program to Reverse Diabetes  Exercise recommendations are:  If unable to walk, then the patient can exercise in a chair 3 times a day. By flapping arms like a bird gently and raising legs outwards to the front.  If ambulatory, the patient can go for walks for 30 minutes 3 times a week. Then increase the intensity and duration as tolerated.  Goal is to try to attain exercise frequency to 5 times a week.  If applicable: Best to perform resistance exercises (machines or weights) 2 days a week and cardio type exercises 3 days per week.  

## 2012-12-05 LAB — NMR, LIPOPROFILE
Cholesterol: 163 mg/dL (ref <200)
HDL Cholesterol by NMR: 34 mg/dL — ABNORMAL LOW (ref >=40)
HDL Particle Number: 30.9 umol/L (ref >=30.5)
LDL Particle Number: 1254 nmol/L — ABNORMAL HIGH (ref <1000)
LDL Size: 20.1 nm — ABNORMAL LOW (ref >20.5)
LDLC SERPL CALC-MCNC: 68 mg/dL (ref <100)
LP-IR Score: 81 — ABNORMAL HIGH (ref <=45)
Small LDL Particle Number: 825 nmol/L — ABNORMAL HIGH (ref <=527)
Triglycerides by NMR: 306 mg/dL — ABNORMAL HIGH (ref <150)

## 2012-12-05 LAB — CMP14+EGFR
ALT: 21 IU/L (ref 0–32)
AST: 20 IU/L (ref 0–40)
Albumin/Globulin Ratio: 1.2 (ref 1.1–2.5)
Albumin: 4 g/dL (ref 3.6–4.8)
Alkaline Phosphatase: 96 IU/L (ref 39–117)
BUN/Creatinine Ratio: 18 (ref 11–26)
BUN: 29 mg/dL — ABNORMAL HIGH (ref 8–27)
CO2: 21 mmol/L (ref 18–29)
Calcium: 9.2 mg/dL (ref 8.6–10.2)
Chloride: 100 mmol/L (ref 97–108)
Creatinine, Ser: 1.65 mg/dL — ABNORMAL HIGH (ref 0.57–1.00)
GFR calc Af Amer: 38 mL/min/{1.73_m2} — ABNORMAL LOW (ref >59)
GFR calc non Af Amer: 33 mL/min/{1.73_m2} — ABNORMAL LOW (ref >59)
Globulin, Total: 3.3 g/dL (ref 1.5–4.5)
Glucose: 141 mg/dL — ABNORMAL HIGH (ref 65–99)
Potassium: 3.9 mmol/L (ref 3.5–5.2)
Sodium: 139 mmol/L (ref 134–144)
Total Bilirubin: 0.2 mg/dL (ref 0.0–1.2)
Total Protein: 7.3 g/dL (ref 6.0–8.5)

## 2012-12-05 LAB — URIC ACID: Uric Acid: 7.5 mg/dL — ABNORMAL HIGH (ref 2.5–7.1)

## 2012-12-05 LAB — FRUCTOSAMINE: Fructosamine: 301 umol/L — ABNORMAL HIGH (ref 0–285)

## 2012-12-10 ENCOUNTER — Encounter: Payer: Self-pay | Admitting: Surgery

## 2012-12-13 ENCOUNTER — Encounter (INDEPENDENT_AMBULATORY_CARE_PROVIDER_SITE_OTHER): Payer: Medicare PPO | Admitting: Vascular Surgery

## 2012-12-13 ENCOUNTER — Encounter: Payer: Self-pay | Admitting: Surgery

## 2012-12-13 ENCOUNTER — Other Ambulatory Visit: Payer: Self-pay

## 2012-12-13 ENCOUNTER — Ambulatory Visit (INDEPENDENT_AMBULATORY_CARE_PROVIDER_SITE_OTHER): Payer: Medicare PPO | Admitting: Surgery

## 2012-12-13 VITALS — BP 141/62 | HR 69 | Ht 66.0 in | Wt 229.0 lb

## 2012-12-13 DIAGNOSIS — D649 Anemia, unspecified: Secondary | ICD-10-CM

## 2012-12-13 DIAGNOSIS — T82898A Other specified complication of vascular prosthetic devices, implants and grafts, initial encounter: Secondary | ICD-10-CM

## 2012-12-13 DIAGNOSIS — N186 End stage renal disease: Secondary | ICD-10-CM | POA: Insufficient documentation

## 2012-12-13 DIAGNOSIS — T82598A Other mechanical complication of other cardiac and vascular devices and implants, initial encounter: Secondary | ICD-10-CM

## 2012-12-13 DIAGNOSIS — R195 Other fecal abnormalities: Secondary | ICD-10-CM

## 2012-12-13 NOTE — Progress Notes (Signed)
[facility]   Patient name: [name] MRN: [id number] DOB: July 20, 1950 Sex: female     Chief Complaint  Patient presents with  . Re-evaluation    c/o AVF sited ballooned-out     HISTORY OF PRESENT ILLNESS: The patient is here for fistula evaluation. This was created by Dr. Kellie Simmering on 07/29/2012. She states that she noted a knot around her incision site and wanted to have it checked out. She's not yet on dialysis. She has no pain or discomfort in her arm. She has no symptoms of steal syndrome. She has no swelling in her arm.  Past Medical History  Diagnosis Date  . Chest pain 2003    ?h/o MI, syncope; 2003 stress nuclear-inferoseptal scar, EF-54%; 2007-MI ruled out, echocardiogram-normal LV size, wall thickness and function  . RLS (restless legs syndrome)     sleep study in 2010: No apnea; moderate periodic movements  . GERD (gastroesophageal reflux disease)     H/o upper GI bleed  . GI bleeding   . Obese   . Gout   . Hypothyroidism   . Low back pain   . Cellulitis     Neck  . Diabetes mellitus 1998    A1c-7.1 in 2010, 6.7 in 2013  . Hyperlipidemia     Fasting lipids 2013:158, 152, 35, 93  . Hypertension 2002  . Anemia   . Hyperkalemia   . Irregular heart beat   . PONV (postoperative nausea and vomiting)   . Myocardial infarction   . Anginal pain     last cp few months ago  . CAD (coronary artery disease)     dr. Lattie Haw (Linna Hoff)  . Depression     Insomnia  . Chronic kidney disease, stage III (moderate)     Creat-1.9 in 04/2008, 2.65 in 02/2010(noHD  . Headache(784.0)   . Vitiligo   . H/O hiatal hernia   . Arthritis   . Cancer     HX SKIN CANCER  . Renal insufficiency     Past Surgical History  Procedure Laterality Date  . Appendectomy    . Carpal tunnel release  1992, 1993, 2011    Right in 2011; previously bilateral  . Tubal ligation  1976  . Partial hysterectomy  1985    Vaginal  . Shoulder surgery  1997    Left   . Incise and drain abcess  2010    Neck-MRSA  . Colonoscopy  02/2002, 07/2009    few diverticula, otherwise negative  . Foot surgery  1984    Bilateral heel spurs  . Abdominal hysterectomy    . Shoulder surgery      RIGHT  ADJUSTED   . Av fistula placement Left 07/16/2012    Procedure: ARTERIOVENOUS (AV) FISTULA CREATION;  Surgeon: Mal Misty, MD;  Location: [facility];  Service: Vascular;  Laterality: Left;  . Av fistula placement Left 07/29/2012    Procedure: ARTERIOVENOUS (AV) FISTULA CREATION;  Surgeon: Mal Misty, MD;  Location: [facility];  Service: Vascular;  Laterality: Left;  Left Brachial-Cephalic Arteriovenous Fistula Creation  . Colonoscopy with esophagogastroduodenoscopy (egd) N/A 08/27/2012    RMR: Normal esophagus s/p 54 F dilator. Small hiatal hernia. Gastric polyp removed, oozing (hyperplastic), which could explain heme positive stool:Colonic diverticulosis    History   Social History  . Marital Status: Divorced    Spouse Name: N/A    Number of Children: 3  . Years of Education: N/A   Occupational History  .  Disabled     previously Nurse, adult   Social History Main Topics  . Smoking status: Never Smoker   . Smokeless tobacco: Never Used  . Alcohol Use: No  . Drug Use: No  . Sexual Activity: No   Other Topics Concern  . Not on file   Social History Narrative   No regular exercise    Family History  Problem Relation Age of Onset  . Emphysema Mother   . COPD Mother   . Colon cancer Father   . Diabetes Father   . Cancer Father     colon  . Hypertension Father   . Congestive Heart Failure Maternal Grandmother   . Cancer Paternal Grandmother     Allergies as of 12/13/2012 - Review Complete 12/13/2012  Allergen Reaction Noted  . Latex Shortness Of Breath and Swelling 01/22/2009  . Vancomycin hcl Swelling 01/22/2009  . Quinapril hcl Swelling and Rash     Current Outpatient Prescriptions on File Prior to Visit  Medication Sig Dispense Refill  .  allopurinol (ZYLOPRIM) 100 MG tablet Take 1 tablet (100 mg total) by mouth every morning.  30 tablet  11  . amLODipine (NORVASC) 5 MG tablet Take 1 tablet (5 mg total) by mouth daily.  90 tablet  3  . carvedilol (COREG) 25 MG tablet Take 1 tablet (25 mg total) by mouth 2 (two) times daily with a meal.  60 tablet  6  . citalopram (CELEXA) 40 MG tablet Take 1 tablet (40 mg total) by mouth at bedtime.  30 tablet  5  . ergocalciferol (VITAMIN D2) 50000 UNITS capsule Take 1 capsule (50,000 Units total) by mouth once a week. Monday  4 capsule  3  . ferrous sulfate 325 (65 FE) MG tablet Take 1 tablet (325 mg total) by mouth daily with breakfast.  30 tablet  3  . fluocinonide (LIDEX) 0.05 % external solution       . furosemide (LASIX) 40 MG tablet Take 1.5 tablets (60 mg total) by mouth daily.  45 tablet  6  . gabapentin (NEURONTIN) 800 MG tablet Take 3 tablets (2,400 mg total) by mouth at bedtime.  90 tablet  3  . insulin aspart (NOVOLOG FLEXPEN) 100 UNIT/ML injection Inject 40 Units into the skin 3 (three) times daily before meals.       . Insulin Glargine (LANTUS SOLOSTAR) 100 UNIT/ML SOPN Inject 41-43 Units into the skin 2 (two) times daily. 41 units in the morning and 43 units at night      . KLOR-CON M10 10 MEQ tablet Take 10 mEq by mouth daily.       Marland Kitchen levothyroxine (SYNTHROID, LEVOTHROID) 50 MCG tablet Take 50 mcg by mouth every evening.      Marland Kitchen lisinopril (PRINIVIL,ZESTRIL) 5 MG tablet Take 1 tablet (5 mg total) by mouth daily.  90 tablet  1  . nitroGLYCERIN (NITROSTAT) 0.4 MG SL tablet Place 1 tablet (0.4 mg total) under the tongue every 5 (five) minutes as needed for chest pain.  50 tablet  0  . omeprazole (PRILOSEC) 20 MG capsule Take 1 capsule (20 mg total) by mouth every morning.  30 capsule  4  . pravastatin (PRAVACHOL) 40 MG tablet Take 1 tablet (40 mg total) by mouth at bedtime.  30 tablet  2  . rOPINIRole (REQUIP) 1 MG tablet Take 1 tablet (1 mg total) by mouth at bedtime.  30 tablet  11    No current facility-administered medications on file prior to visit.  REVIEW OF SYSTEMS: All systems negative  PHYSICAL EXAMINATION:   Vital signs are BP 141/62  Pulse 69  Ht 5\' 6"  (1.676 m)  Wt 229 lb (103.874 kg)  BMI 36.98 kg/m2  SpO2 96% General: The patient appears their stated age. HEENT:  No gross abnormalities Pulmonary:  Non labored breathing Musculoskeletal: There are no major deformities. Neurologic: No focal weakness or paresthesias are detected, Skin: There are no ulcer or rashes noted. Psychiatric: The patient has normal affect. Cardiovascular:  There is an excellent thrill within the left upper arm fistula. I did not feel any aneurysmal segments in the area of concern. That area actually is relatively superficial and have an excellent thrill to it.   Diagnostic Studies Duplex ultrasound of the fistula was performed fistula is widely patent. Vein diameters are excellent, all greater than 0.8 cm. There is an area of elevated velocities just beyond the arteriovenous anastomosis. There is also a large branch measuring 0.5 cm in the mid arm  Assessment: Stage IV renal insufficiency Plan: I think that the patient's fistula is functioning nicely. The area of where the patient has concern, there is no evidence that this is a problem. 5 this area looks very normal. I would recommend using the fistula if and when it is required. If there   Vany difficulty with flow rates, I would consider first a fistulogram to evaluate the elevated velocities in the proximal portion of the fistula. Otherwise she may require ligation of the large branch in the mid arm. However, at this time a did not feel like either of these are warranted. She will followup on a when necessary basis  . Leia Alf, M.D. [facility] Office: [phone] Pager:  [phone]

## 2012-12-16 ENCOUNTER — Other Ambulatory Visit: Payer: Self-pay | Admitting: Gastroenterology

## 2012-12-17 LAB — CBC WITH DIFFERENTIAL/PLATELET
Basophils Absolute: 0 10*3/uL (ref 0.0–0.1)
Basophils Relative: 0 % (ref 0–1)
Eosinophils Absolute: 0.4 10*3/uL (ref 0.0–0.7)
Eosinophils Relative: 4 % (ref 0–5)
MCH: 27.7 pg (ref 26.0–34.0)
MCV: 84 fL (ref 78.0–100.0)
Neutrophils Relative %: 65 % (ref 43–77)
Platelets: 401 10*3/uL — ABNORMAL HIGH (ref 150–400)
RBC: 3.94 MIL/uL (ref 3.87–5.11)
RDW: 15.8 % — ABNORMAL HIGH (ref 11.5–15.5)
WBC: 9.2 10*3/uL (ref 4.0–10.5)

## 2012-12-17 LAB — IRON: Iron: 55 ug/dL (ref 42–145)

## 2012-12-20 NOTE — Progress Notes (Signed)
Quick Note:  Hgb 10.9 but iron and ferritin both improved.  Continue iron supplementation for now.  Let's check ifobt. ______

## 2013-01-03 ENCOUNTER — Ambulatory Visit (INDEPENDENT_AMBULATORY_CARE_PROVIDER_SITE_OTHER): Payer: Medicare HMO | Admitting: Pharmacist

## 2013-01-03 VITALS — BP 130/66 | HR 68 | Ht 66.0 in | Wt 228.0 lb

## 2013-01-03 DIAGNOSIS — E785 Hyperlipidemia, unspecified: Secondary | ICD-10-CM

## 2013-01-03 DIAGNOSIS — E1140 Type 2 diabetes mellitus with diabetic neuropathy, unspecified: Secondary | ICD-10-CM

## 2013-01-03 DIAGNOSIS — N184 Chronic kidney disease, stage 4 (severe): Secondary | ICD-10-CM

## 2013-01-03 DIAGNOSIS — E1149 Type 2 diabetes mellitus with other diabetic neurological complication: Secondary | ICD-10-CM

## 2013-01-03 DIAGNOSIS — E669 Obesity, unspecified: Secondary | ICD-10-CM

## 2013-01-03 DIAGNOSIS — E1142 Type 2 diabetes mellitus with diabetic polyneuropathy: Secondary | ICD-10-CM

## 2013-01-03 DIAGNOSIS — I10 Essential (primary) hypertension: Secondary | ICD-10-CM

## 2013-01-03 LAB — GLUCOSE, POCT (MANUAL RESULT ENTRY): POC Glucose: 296 mg/dl — AB (ref 70–99)

## 2013-01-03 NOTE — Progress Notes (Signed)
Diabetes Flow Sheet:  Visit 1  Chief Complaint:   Chief Complaint  Patient presents with  . Hyperlipidemia  . Diabetes    Filed Vitals:   01/03/13 0856  BP: 130/66  Pulse: 68   Filed Weights   01/03/13 0856  Weight: 228 lb (103.42 kg)   HPI:  Patient with type 2DM since around 1998.  She also has comorbid conditions of hyperlipidemia, hyptertension and stage 4 chronic kidney disease.  Her most recent A1C shows uncontrolled DM.   Current DM medication - Lantus 41 units each morning and 43 units each evening, Novolog 40 units prior to each meal.   Edema:  negative  Polyuria:  negatvie  Polydipsia:  negative Polyphagia:  positive  BMI:  Body mass index is 36.82 kg/(m^2).   Weight changes:  stable General Appearance:  alert, oriented, no acute distress and obese Mood/Affect:  normal  Low fat/carbohydrate diet?  No, Recently switched from soda to sweet tea.  Breakfast - 2 eggs, toast, tomato.  Lunch - sandwich - tomato.  Supper - pork chops or chicken, vegetable and potatoes.  Snack - doesn't snack.  Nicotine Abuse?  No Medication Compliance?  Patient stated at one point during the interview that she doesn't miss nay medications but later said that she misses an insulin does about 2 times per week.  Exercise?  No - ;limited by back pain Alcohol Abuse?  No    Lab Results  Component Value Date   HGBA1C 11.7% 12/03/2012    Lab Results  Component Value Date   CHOL 163 12/03/2012   HDL 21 01/22/2009   LDLCALC 72 07/26/2012   TRIG 527* 07/26/2012   CHOLHDL 7.1 05/15/2008     Medication Checklist: ACE Inhibitor/ARB?  Yes Lipid Lowering Agent?  Yes Aspirin?  No (history of GI bleed)   Assessment: 1.  type 2 Diabetes.  uncontrolled 2.  Blood Pressure Control.  controlled 3.  Lipid Control.  Elevated Tg and LDL-P 4.  Stage 4 CKD  Recommendations: 1.  1800 calorie, carbohydrate counting diet.  Patient is counseled extensively on carbohydrate counting, serving sizes, saturated  fat intake and meal planning. 45 grams CHO per meal and 15 grams per snack.   Patient is instructed to eat 3 meals a day and 3 small snacks.  Patient will supplement snacks based on physical activity. 2.  5 minutes of physical activity to start and wok up as tolerated.  Suggested Silver Sneakers program.  Patient is counseled to always carry glucose tablets, lifesavers, hard candies, etc., while exercising in case of hypoglycemic event. 3.  Patient is counseled on pathophysiology of diabetes and the risk of long-term complications.  Fasting blood glucose goals are 70-130mg /dL.  Post-prandial goals are < 180.  A1C goals < 7.0%. 4.  LDL goal of < 100, HDL > 40 and TG < 150; BP goal < 130/80. 5.  Patient is counseled on proper use of glucometer and lancing device.  Patient is informed how often to test and how to respond to unsuitable results. 6.  Medication recommendations at this time are as follows:    Decrease gabapentin to 1600mg  at bedtime  I believe that patient needs increase in Lantus but with her HBG readings reported I am afraid to increase hypoglycemia.  Will try to see what dietary changes she can make and how this will improve BG.  We did discuss come tip on how to improve compliance with insulin regimen. 7.  Patient is reminded to bring  HBG monitor to next visit.    Time spent counseling patient:  60 minutes   Referring provider:  Tish Men, PharmD, CPP

## 2013-01-06 ENCOUNTER — Ambulatory Visit (INDEPENDENT_AMBULATORY_CARE_PROVIDER_SITE_OTHER): Payer: Medicare PPO | Admitting: Cardiology

## 2013-01-06 ENCOUNTER — Encounter: Payer: Self-pay | Admitting: Cardiology

## 2013-01-06 VITALS — BP 127/65 | HR 72 | Ht 66.0 in | Wt 232.2 lb

## 2013-01-06 DIAGNOSIS — R0602 Shortness of breath: Secondary | ICD-10-CM

## 2013-01-06 NOTE — Progress Notes (Signed)
Clinical Summary Ms. Trenkamp is a 62 y.o.female seen for follow up of the following problems.  1. DOE - started approx 6 months ago. SOB with activity. DOE with walking around house. No significant chest pain. Occas palpitations. Uses 2-3 pillows for several years now, no PND. Occas LE edema, better with increased lasix dose.  - June 2014 echo with normal systolic and diastolic function. Stress MPI with no ischemia  - no history of smoking. Grew up in house with both partents and sister smoking, children smoked at home. Never had PFTs.    2. HTN - does not check at home - compliant w/ meds  3. Hyperlipidemia - compliant pravastatin - followed by PCP  Past Medical History  Diagnosis Date  . Chest pain 2003    ?h/o MI, syncope; 2003 stress nuclear-inferoseptal scar, EF-54%; 2007-MI ruled out, echocardiogram-normal LV size, wall thickness and function  . RLS (restless legs syndrome)     sleep study in 2010: No apnea; moderate periodic movements  . GERD (gastroesophageal reflux disease)     H/o upper GI bleed  . GI bleeding   . Obese   . Gout   . Hypothyroidism   . Low back pain   . Cellulitis     Neck  . Diabetes mellitus 1998    A1c-7.1 in 2010, 6.7 in 2013  . Hyperlipidemia     Fasting lipids 2013:158, 152, 35, 93  . Hypertension 2002  . Anemia   . Hyperkalemia   . Irregular heart beat   . PONV (postoperative nausea and vomiting)   . Myocardial infarction   . Anginal pain     last cp few months ago  . CAD (coronary artery disease)     dr. Lattie Haw (Linna Hoff)  . Depression     Insomnia  . Chronic kidney disease, stage III (moderate)     Creat-1.9 in 04/2008, 2.65 in 02/2010(noHD  . Headache(784.0)   . Vitiligo   . H/O hiatal hernia   . Arthritis   . Cancer     HX SKIN CANCER  . Renal insufficiency      Allergies  Allergen Reactions  . Latex Shortness Of Breath and Swelling    Facial swelling  . Vancomycin Hcl Swelling    REACTION: facial swelling    . Quinapril Hcl Swelling and Rash    Arm swelling     Current Outpatient Prescriptions  Medication Sig Dispense Refill  . allopurinol (ZYLOPRIM) 100 MG tablet Take 1 tablet (100 mg total) by mouth every morning.  30 tablet  11  . amLODipine (NORVASC) 5 MG tablet Take 1 tablet (5 mg total) by mouth daily.  90 tablet  3  . carvedilol (COREG) 25 MG tablet Take 1 tablet (25 mg total) by mouth 2 (two) times daily with a meal.  60 tablet  6  . citalopram (CELEXA) 40 MG tablet Take 1 tablet (40 mg total) by mouth at bedtime.  30 tablet  5  . ergocalciferol (VITAMIN D2) 50000 UNITS capsule Take 1 capsule (50,000 Units total) by mouth once a week. Monday  4 capsule  3  . ferrous sulfate 325 (65 FE) MG tablet Take 1 tablet (325 mg total) by mouth daily with breakfast.  30 tablet  3  . fluocinonide (LIDEX) 0.05 % external solution       . furosemide (LASIX) 40 MG tablet Take 1.5 tablets (60 mg total) by mouth daily.  45 tablet  6  . gabapentin (  NEURONTIN) 800 MG tablet Take 2 tablets (1,600 mg total) by mouth at bedtime.  60 tablet  3  . insulin aspart (NOVOLOG FLEXPEN) 100 UNIT/ML injection Inject 40 Units into the skin 3 (three) times daily before meals.       . Insulin Glargine (LANTUS SOLOSTAR) 100 UNIT/ML SOPN Inject 41-48 Units into the skin 2 (two) times daily. 41 units in the morning and 43 units at night      . KLOR-CON M10 10 MEQ tablet Take 10 mEq by mouth daily.       Marland Kitchen levothyroxine (SYNTHROID, LEVOTHROID) 50 MCG tablet Take 50 mcg by mouth every evening.      Marland Kitchen lisinopril (PRINIVIL,ZESTRIL) 5 MG tablet Take 1 tablet (5 mg total) by mouth daily.  90 tablet  1  . nitroGLYCERIN (NITROSTAT) 0.4 MG SL tablet Place 1 tablet (0.4 mg total) under the tongue every 5 (five) minutes as needed for chest pain.  50 tablet  0  . omeprazole (PRILOSEC) 20 MG capsule Take 1 capsule (20 mg total) by mouth every morning.  30 capsule  4  . pravastatin (PRAVACHOL) 40 MG tablet Take 1 tablet (40 mg total) by  mouth at bedtime.  30 tablet  2  . rOPINIRole (REQUIP) 1 MG tablet Take 1 tablet (1 mg total) by mouth at bedtime.  30 tablet  11   No current facility-administered medications for this visit.     Past Surgical History  Procedure Laterality Date  . Appendectomy    . Carpal tunnel release  1992, 1993, 2011    Right in 2011; previously bilateral  . Tubal ligation  1976  . Partial hysterectomy  1985    Vaginal  . Shoulder surgery  1997    Left  . Incise and drain abcess  2010    Neck-MRSA  . Colonoscopy  02/2002, 07/2009    few diverticula, otherwise negative  . Foot surgery  1984    Bilateral heel spurs  . Abdominal hysterectomy    . Shoulder surgery      RIGHT  ADJUSTED   . Av fistula placement Left 07/16/2012    Procedure: ARTERIOVENOUS (AV) FISTULA CREATION;  Surgeon: Mal Misty, MD;  Location: [facility];  Service: Vascular;  Laterality: Left;  . Av fistula placement Left 07/29/2012    Procedure: ARTERIOVENOUS (AV) FISTULA CREATION;  Surgeon: Mal Misty, MD;  Location: [place];  Service: Vascular;  Laterality: Left;  Left Brachial-Cephalic Arteriovenous Fistula Creation  . Colonoscopy with esophagogastroduodenoscopy (egd) N/A 08/27/2012    RMR: Normal esophagus s/p 54 F dilator. Small hiatal hernia. Gastric polyp removed, oozing (hyperplastic), which could explain heme positive stool:Colonic diverticulosis     Allergies  Allergen Reactions  . Latex Shortness Of Breath and Swelling    Facial swelling  . Vancomycin Hcl Swelling    REACTION: facial swelling  . Quinapril Hcl Swelling and Rash    Arm swelling      Family History  Problem Relation Age of Onset  . Emphysema Mother   . COPD Mother   . Colon cancer Father   . Diabetes Father   . Cancer Father     colon  . Hypertension Father   . Congestive Heart Failure Maternal Grandmother   . Cancer Paternal Grandmother      Social History Ms. Nobel reports that she has never smoked. She has never used smokeless  tobacco. Ms. Schupp reports that she does not drink alcohol.   Review of Systems  CONSTITUTIONAL: No weight loss, fever, chills, weakness or fatigue.  HEENT: Eyes: No visual loss, blurred vision, double vision or yellow sclerae.No hearing loss, sneezing, congestion, runny nose or sore throat.  SKIN: No rash or itching.  CARDIOVASCULAR: per HPI RESPIRATORY: per HPI  GASTROINTESTINAL: No anorexia, nausea, vomiting or diarrhea. No abdominal pain or blood.  GENITOURINARY: No burning on urination, no polyuria NEUROLOGICAL: No headache, dizziness, syncope, paralysis, ataxia, numbness or tingling in the extremities. No change in bowel or bladder control.  MUSCULOSKELETAL: No muscle, back pain, joint pain or stiffness.  LYMPHATICS: No enlarged nodes. No history of splenectomy.  PSYCHIATRIC: No history of depression or anxiety.  ENDOCRINOLOGIC: No reports of sweating, cold or heat intolerance. No polyuria or polydipsia.  Marland Kitchen   Physical Examination p72 bp 127/65 Wt 232 lbs BMI 38 Gen: resting comfortably, no acute distress HEENT: no scleral icterus, pupils equal round and reactive, no palptable cervical adenopathy,  CV: RRR, no m/r/g, no JVD, no carotid bruits Resp: Clear to auscultation bilaterally GI: abdomen is soft, non-tender, non-distended, normal bowel sounds, no hepatosplenomegaly MSK: extremities are warm, no edema.  Skin: warm, no rash Neuro:  no focal deficits Psych: appropriate affect   Diagnostic Studies 09/03/12 Echo: LVEF 55-60%, mild LVH, normal diastolic function, PASP 50, trivial effusion, normal IVC, normal RV  09/06/12 MPI:  Scintigraphic Data: Acquisition notable for mild breast attenuation, moderate subdiaphragmatic activity adjacent to the inferior myocardium and normal left ventricular size. On tomographic images reconstructed in standard planes, there was a small defect of borderline numeric significance in the mid and distal anteroseptal segment. By comparison to  the resting portion of the study, no reversibility was apparent. The gated reconstruction demonstrated normal regional and global LV systolic function as well as normal systolic accentuation of activity throughout. Estimated ejection fraction was 65%.  IMPRESSION: Negative combined low level exercise and pharmacologic stress nuclear myocardial study. Mild breast attenuation was noted without evidence for ischemia or infarction. There was no electrocardiographic evidence for myocardial hypoperfusion nor were abnormalities apparent by scintigraphic imaging. Other findings as noted.     Assessment and Plan  1. DOE - no clear cardiac etiology from workup including no evidence of significant systolic dysfunction, diastolic dysfunction, or ischemia. Her PA pressure is slightly elevated on echo, however she has normal RA pressures with no evidence of right sided enlargement or dysfunction. I don't think she has significant pulmonary HTN based on these findings - long history of second hand smoke exposure, will order PFTs.   2. HTN - at goal, continue current meds  3. HL - managed by her PCP, continue statin per there recommendations  4. Primary prevention - ASA 81mg  daily started given her history of diabetes     Arnoldo Lenis, M.D., F.A.C.C.

## 2013-01-06 NOTE — Patient Instructions (Signed)
Your physician recommends that you schedule a follow-up appointment in: 3 months with Dr Harl Bowie  Your physician has recommended you make the following change in your medication:  1. Start Aspirin 81 mg daily  Your physician has recommended that you have a pulmonary function test. Pulmonary Function Tests are a group of tests that measure how well air moves in and out of your lungs.

## 2013-01-20 ENCOUNTER — Other Ambulatory Visit: Payer: Self-pay | Admitting: Family Medicine

## 2013-01-24 ENCOUNTER — Ambulatory Visit (HOSPITAL_COMMUNITY)
Admission: RE | Admit: 2013-01-24 | Discharge: 2013-01-24 | Disposition: A | Discharge status: Home / Self Care | Payer: Medicare PPO | Source: Ambulatory Visit | Attending: Cardiology | Admitting: Cardiology

## 2013-01-24 DIAGNOSIS — R0602 Shortness of breath: Secondary | ICD-10-CM | POA: Insufficient documentation

## 2013-01-24 LAB — BLOOD GAS, ARTERIAL
Bicarbonate: 21.3 mEq/L (ref 20.0–24.0)
FIO2: 0.21 %
Patient temperature: 37
TCO2: 19.6 mmol/L (ref 0–100)
pCO2 arterial: 37 mmHg (ref 35.0–45.0)
pH, Arterial: 7.379 (ref 7.350–7.450)
pO2, Arterial: 76.2 mmHg — ABNORMAL LOW (ref 80.0–100.0)

## 2013-01-24 MED ORDER — ALBUTEROL SULFATE (5 MG/ML) 0.5% IN NEBU
2.5000 mg | INHALATION_SOLUTION | Freq: Once | RESPIRATORY_TRACT | Status: AC
  Administered 2013-01-24: 2.5 mg via RESPIRATORY_TRACT

## 2013-01-27 NOTE — Procedures (Signed)
NAMESHEYANNA, [name] NO.:  [id number]  MEDICAL RECORD NO.:  [id number]  LOCATION:                                 FACILITY:  PHYSICIAN:  Daune Divirgilio L. Luan Pulling, M.D.DATE OF BIRTH:  07-07-1950  DATE OF PROCEDURE:  01/25/2013 DATE OF DISCHARGE:                           PULMONARY FUNCTION TEST   REASON FOR PULMONARY FUNCTION TESTING:  Shortness of breath.  1. Spirometry shows a moderate ventilatory defect without definite     airflow obstruction. 2. Lung volume show reduction in total lung capacity. 3. DLCO is moderately to severely reduced. 4. Airway resistance is minimally high. 5. There is no improvement with inhaled bronchodilator. 6. Arterial blood gas is normal. 7. Noting the patient's height and weight, this may be related to body     habitus.     [name] L. Luan Pulling, M.D.     ELH/MEDQ  D:  01/25/2013  T:  01/26/2013  Job:  [id number]  cc:   Carlyle Dolly

## 2013-02-01 ENCOUNTER — Emergency Department (HOSPITAL_COMMUNITY): Payer: Medicare PPO

## 2013-02-01 ENCOUNTER — Encounter (HOSPITAL_COMMUNITY): Payer: Self-pay | Admitting: Emergency Medicine

## 2013-02-01 ENCOUNTER — Emergency Department (HOSPITAL_COMMUNITY)
Admission: EM | Admit: 2013-02-01 | Discharge: 2013-02-01 | Disposition: A | Discharge status: Home / Self Care | Payer: Medicare PPO | Attending: Emergency Medicine | Admitting: Emergency Medicine

## 2013-02-01 DIAGNOSIS — D649 Anemia, unspecified: Secondary | ICD-10-CM | POA: Insufficient documentation

## 2013-02-01 DIAGNOSIS — E669 Obesity, unspecified: Secondary | ICD-10-CM | POA: Insufficient documentation

## 2013-02-01 DIAGNOSIS — I209 Angina pectoris, unspecified: Secondary | ICD-10-CM | POA: Insufficient documentation

## 2013-02-01 DIAGNOSIS — N183 Chronic kidney disease, stage 3 unspecified: Secondary | ICD-10-CM | POA: Insufficient documentation

## 2013-02-01 DIAGNOSIS — M129 Arthropathy, unspecified: Secondary | ICD-10-CM | POA: Insufficient documentation

## 2013-02-01 DIAGNOSIS — I2510 Atherosclerotic heart disease of native coronary artery without angina pectoris: Secondary | ICD-10-CM | POA: Insufficient documentation

## 2013-02-01 DIAGNOSIS — S0003XA Contusion of scalp, initial encounter: Secondary | ICD-10-CM

## 2013-02-01 DIAGNOSIS — E785 Hyperlipidemia, unspecified: Secondary | ICD-10-CM | POA: Insufficient documentation

## 2013-02-01 DIAGNOSIS — I129 Hypertensive chronic kidney disease with stage 1 through stage 4 chronic kidney disease, or unspecified chronic kidney disease: Secondary | ICD-10-CM | POA: Insufficient documentation

## 2013-02-01 DIAGNOSIS — Y9389 Activity, other specified: Secondary | ICD-10-CM | POA: Insufficient documentation

## 2013-02-01 DIAGNOSIS — Z8669 Personal history of other diseases of the nervous system and sense organs: Secondary | ICD-10-CM | POA: Insufficient documentation

## 2013-02-01 DIAGNOSIS — S0993XA Unspecified injury of face, initial encounter: Secondary | ICD-10-CM | POA: Insufficient documentation

## 2013-02-01 DIAGNOSIS — W07XXXA Fall from chair, initial encounter: Secondary | ICD-10-CM

## 2013-02-01 DIAGNOSIS — E119 Type 2 diabetes mellitus without complications: Secondary | ICD-10-CM | POA: Insufficient documentation

## 2013-02-01 DIAGNOSIS — Z872 Personal history of diseases of the skin and subcutaneous tissue: Secondary | ICD-10-CM | POA: Insufficient documentation

## 2013-02-01 DIAGNOSIS — Z79899 Other long term (current) drug therapy: Secondary | ICD-10-CM | POA: Insufficient documentation

## 2013-02-01 DIAGNOSIS — Z91040 Latex allergy status: Secondary | ICD-10-CM | POA: Insufficient documentation

## 2013-02-01 DIAGNOSIS — Z794 Long term (current) use of insulin: Secondary | ICD-10-CM | POA: Insufficient documentation

## 2013-02-01 DIAGNOSIS — Y9289 Other specified places as the place of occurrence of the external cause: Secondary | ICD-10-CM | POA: Insufficient documentation

## 2013-02-01 DIAGNOSIS — K219 Gastro-esophageal reflux disease without esophagitis: Secondary | ICD-10-CM | POA: Insufficient documentation

## 2013-02-01 DIAGNOSIS — M109 Gout, unspecified: Secondary | ICD-10-CM | POA: Insufficient documentation

## 2013-02-01 DIAGNOSIS — F329 Major depressive disorder, single episode, unspecified: Secondary | ICD-10-CM | POA: Insufficient documentation

## 2013-02-01 DIAGNOSIS — I252 Old myocardial infarction: Secondary | ICD-10-CM | POA: Insufficient documentation

## 2013-02-01 DIAGNOSIS — F3289 Other specified depressive episodes: Secondary | ICD-10-CM | POA: Insufficient documentation

## 2013-02-01 DIAGNOSIS — E039 Hypothyroidism, unspecified: Secondary | ICD-10-CM | POA: Insufficient documentation

## 2013-02-01 DIAGNOSIS — Z85828 Personal history of other malignant neoplasm of skin: Secondary | ICD-10-CM | POA: Insufficient documentation

## 2013-02-01 DIAGNOSIS — Z7982 Long term (current) use of aspirin: Secondary | ICD-10-CM | POA: Insufficient documentation

## 2013-02-01 DIAGNOSIS — W1809XA Striking against other object with subsequent fall, initial encounter: Secondary | ICD-10-CM | POA: Insufficient documentation

## 2013-02-01 NOTE — ED Notes (Signed)
Pt presents  Secondary to a fall after a camping stool broke that she was sitting on causing her to fall backwards striking her head on concrete. Pt has an approximately 2 inch wide by 1 inch raised "knot " on the occipital area of her head. Pt denies LOC, vision changes and N/V.  NAD noted. Pt ambulated in room with steady gait noted.

## 2013-02-01 NOTE — ED Notes (Addendum)
Pt went to sit down in a collapable camping chair at the [employer] and the chair broke under her causing her to fall backwards hitting her head against the floor, pt alert, oriented, denies any LOC, n/v, vision problems, does complain of headache and pain between the shoulder blades. CBG with EMS 307

## 2013-02-01 NOTE — ED Provider Notes (Signed)
CSN: [id number]     Arrival date & time 02/01/13  1534 History   First MD Initiated Contact with Patient 02/01/13 1544     Chief Complaint  Patient presents with  . Fall    HPI Pt was seen at 20. Per pt, c/o sudden onset and resolution of one episode of fall that occurred this afternoon PTA. Pt states she sat down in a folding chair and "it collapsed." States she fell backwards onto the floor, hitting her head. Pt was ambulatory after the fall. Pt c/o head and neck pain. Denies syncope, no LOC, no AMS, no CP/SOB, no abd pain, no N/V/D, no visual changes, no focal motor weakness, no tingling/numbness in extremities, no ataxia, no slurred speech, no facial droop.     Past Medical History  Diagnosis Date  . Chest pain 2003    ?h/o MI, syncope; 2003 stress nuclear-inferoseptal scar, EF-54%; 2007-MI ruled out, echocardiogram-normal LV size, wall thickness and function  . RLS (restless legs syndrome)     sleep study in 2010: No apnea; moderate periodic movements  . GERD (gastroesophageal reflux disease)     H/o upper GI bleed  . GI bleeding   . Obese   . Gout   . Hypothyroidism   . Low back pain   . Cellulitis     Neck  . Diabetes mellitus 1998    A1c-7.1 in 2010, 6.7 in 2013  . Hyperlipidemia     Fasting lipids 2013:158, 152, 35, 93  . Hypertension 2002  . Anemia   . Hyperkalemia   . Irregular heart beat   . PONV (postoperative nausea and vomiting)   . Myocardial infarction   . Anginal pain     last cp few months ago  . CAD (coronary artery disease)     dr. Lattie Haw (Linna Hoff)  . Depression     Insomnia  . Chronic kidney disease, stage III (moderate)     Creat-1.9 in 04/2008, 2.65 in 02/2010(noHD  . Headache(784.0)   . Vitiligo   . H/O hiatal hernia   . Arthritis   . Cancer     HX SKIN CANCER  . Renal insufficiency    Past Surgical History  Procedure Laterality Date  . Appendectomy    . Carpal tunnel release  1992, 1993, 2011    Right in 2011; previously  bilateral  . Tubal ligation  1976  . Partial hysterectomy  1985    Vaginal  . Shoulder surgery  1997    Left  . Incise and drain abcess  2010    Neck-MRSA  . Colonoscopy  02/2002, 07/2009    few diverticula, otherwise negative  . Foot surgery  1984    Bilateral heel spurs  . Abdominal hysterectomy    . Shoulder surgery      RIGHT  ADJUSTED   . Av fistula placement Left 07/16/2012    Procedure: ARTERIOVENOUS (AV) FISTULA CREATION;  Surgeon: Mal Misty, MD;  Location: [place];  Service: Vascular;  Laterality: Left;  . Av fistula placement Left 07/29/2012    Procedure: ARTERIOVENOUS (AV) FISTULA CREATION;  Surgeon: Mal Misty, MD;  Location: [place];  Service: Vascular;  Laterality: Left;  Left Brachial-Cephalic Arteriovenous Fistula Creation  . Colonoscopy with esophagogastroduodenoscopy (egd) N/A 08/27/2012    RMR: Normal esophagus s/p 54 F dilator. Small hiatal hernia. Gastric polyp removed, oozing (hyperplastic), which could explain heme positive stool:Colonic diverticulosis   Family History  Problem Relation Age of Onset  . Emphysema  Mother   . COPD Mother   . Colon cancer Father   . Diabetes Father   . Cancer Father     colon  . Hypertension Father   . Congestive Heart Failure Maternal Grandmother   . Cancer Paternal Grandmother    History  Substance Use Topics  . Smoking status: Never Smoker   . Smokeless tobacco: Never Used  . Alcohol Use: No    Review of Systems ROS: Statement: All systems negative except as marked or noted in the HPI; Constitutional: Negative for fever and chills. ; ; Eyes: Negative for eye pain, redness and discharge. ; ; ENMT: Negative for ear pain, hoarseness, nasal congestion, sinus pressure and sore throat. ; ; Cardiovascular: Negative for chest pain, palpitations, diaphoresis, dyspnea and peripheral edema. ; ; Respiratory: Negative for cough, wheezing and stridor. ; ; Gastrointestinal: Negative for nausea, vomiting, diarrhea, abdominal pain, blood  in stool, hematemesis, jaundice and rectal bleeding. ; ; Genitourinary: Negative for dysuria, flank pain and hematuria. ; ; Musculoskeletal: +head and neck pain. Negative for back pain. Negative for swelling and trauma.; ; Skin: Negative for pruritus, rash, abrasions, blisters, bruising and skin lesion.; ; Neuro: Negative for headache, lightheadedness and neck stiffness. Negative for weakness, altered level of consciousness , altered mental status, extremity weakness, paresthesias, involuntary movement, seizure and syncope.     Allergies  Latex; Vancomycin hcl; and Quinapril hcl  Home Medications   Current Outpatient Rx  Name  Route  Sig  Dispense  Refill  . allopurinol (ZYLOPRIM) 100 MG tablet   Oral   Take 1 tablet (100 mg total) by mouth every morning.   30 tablet   11   . amLODipine (NORVASC) 5 MG tablet   Oral   Take 1 tablet (5 mg total) by mouth daily.   90 tablet   3   . aspirin EC 81 MG tablet   Oral   Take 81 mg by mouth daily.         . carvedilol (COREG) 25 MG tablet   Oral   Take 1 tablet (25 mg total) by mouth 2 (two) times daily with a meal.   60 tablet   6   . citalopram (CELEXA) 40 MG tablet   Oral   Take 1 tablet (40 mg total) by mouth at bedtime.   30 tablet   5   . clobetasol (TEMOVATE) 0.05 % external solution               . ergocalciferol (VITAMIN D2) 50000 UNITS capsule   Oral   Take 1 capsule (50,000 Units total) by mouth once a week. Monday   4 capsule   3   . ferrous sulfate 325 (65 FE) MG tablet   Oral   Take 1 tablet (325 mg total) by mouth daily with breakfast.   30 tablet   3   . fluocinonide (LIDEX) 0.05 % external solution               . furosemide (LASIX) 40 MG tablet   Oral   Take 1.5 tablets (60 mg total) by mouth daily.   45 tablet   6   . gabapentin (NEURONTIN) 800 MG tablet   Oral   Take 2 tablets (1,600 mg total) by mouth at bedtime.   60 tablet   3   . gabapentin (NEURONTIN) 800 MG tablet      TAKE  THREE TABLETS BY MOUTH AT BEDTIME   90 tablet  1   . insulin aspart (NOVOLOG FLEXPEN) 100 UNIT/ML injection   Subcutaneous   Inject 40 Units into the skin 3 (three) times daily before meals.          . Insulin Glargine (LANTUS SOLOSTAR) 100 UNIT/ML SOPN   Subcutaneous   Inject 41-48 Units into the skin 2 (two) times daily. 41 units in the morning and 43 units at night         . KLOR-CON M10 10 MEQ tablet   Oral   Take 10 mEq by mouth daily.          Marland Kitchen levothyroxine (SYNTHROID, LEVOTHROID) 50 MCG tablet   Oral   Take 50 mcg by mouth every evening.         . linagliptin (TRADJENTA) 5 MG TABS tablet   Oral   Take 5 mg by mouth daily.         Marland Kitchen lisinopril (PRINIVIL,ZESTRIL) 5 MG tablet   Oral   Take 1 tablet (5 mg total) by mouth daily.   90 tablet   1   . nitroGLYCERIN (NITROSTAT) 0.4 MG SL tablet   Sublingual   Place 1 tablet (0.4 mg total) under the tongue every 5 (five) minutes as needed for chest pain.   50 tablet   0   . omeprazole (PRILOSEC) 20 MG capsule   Oral   Take 1 capsule (20 mg total) by mouth every morning.   30 capsule   4   . pravastatin (PRAVACHOL) 40 MG tablet   Oral   Take 1 tablet (40 mg total) by mouth at bedtime.   30 tablet   2   . rOPINIRole (REQUIP) 1 MG tablet   Oral   Take 1 tablet (1 mg total) by mouth at bedtime.   30 tablet   11    BP 133/47  Pulse 74  Temp(Src) 98.7 F (37.1 C) (Oral)  Ht 5\' 6"  (1.676 m)  Wt 232 lb (105.235 kg)  BMI 37.46 kg/m2  SpO2 96% Physical Exam 1605: Physical examination: Vital signs and O2 SAT: Reviewed; Constitutional: Well developed, Well nourished, Well hydrated, In no acute distress; Head and Face: Normocephalic. Small contusion to posterior scalp. No ecchymosis, no abrasions, no open wounds.; Eyes: EOMI, PERRL, No scleral icterus; ENMT: Mouth and pharynx normal, Left TM normal, Right TM normal, Mucous membranes moist; Neck: Supple, Trachea midline; Spine: No midline CS, TS, LS  tenderness. +TTP bilat cervical paraspinal muscles. No ecchymosis, no abrasions.;; Cardiovascular: Regular rate and rhythm, No gallop; Respiratory: Breath sounds clear & equal bilaterally, No wheezes, Normal respiratory effort/excursion; Chest: Nontender, No deformity, Movement normal, No crepitus, No abrasions or ecchymosis.; Abdomen: Soft, Nontender, Nondistended, Normal bowel sounds, No abrasions or ecchymosis.; Genitourinary: No CVA tenderness;; Extremities: No deformity, Full range of motion major/large joints of bilat UE's and LE's without pain or tenderness to palp, Neurovascularly intact, Pulses normal, No tenderness, No edema, Pelvis stable; Neuro: AA&Ox3, GCS 15.  Major CN grossly intact. Speech clear. Climbs on and off stretcher easily by herself. Gait steady. No gross focal motor or sensory deficits in extremities.; Skin: Color normal, Warm, Dry   ED Course  Procedures    EKG Interpretation   None       MDM  MDM Reviewed: previous chart, nursing note and vitals Interpretation: CT scan     Ct Head Wo Contrast 02/01/2013   CLINICAL DATA:  [place] backwards at of broken chair, hit head  EXAM: CT HEAD WITHOUT CONTRAST  CT  CERVICAL SPINE WITHOUT CONTRAST  TECHNIQUE: Multidetector CT imaging of the head and cervical spine was performed following the standard protocol without intravenous contrast. Multiplanar CT image reconstructions of the cervical spine were also generated.  COMPARISON:  Prior CT scan of the cervical spine 05/14/2010  FINDINGS: CT HEAD FINDINGS  Negative for acute intracranial hemorrhage, acute infarction, mass, mass effect, hydrocephalus or midline shift. Gray-white differentiation is preserved throughout. Trace reticulation of the subcutaneous fat overlying the left occipital bone likely reflects small scalp contusion. No large hematoma. No calvarial fracture. Normal aeration of the mastoid air cells and paranasal sinuses. Trace atherosclerotic calcification of bilateral  intracranial internal carotid arteries.  CT CERVICAL SPINE FINDINGS  No acute fracture, malalignment or prevertebral soft tissue swelling. Degenerative disc disease at C5-C6. Mild left facet arthropathy at C2-C3 and moderate facet arthropathy on the right at C6-C7. Heterogeneous and mildly enlarged right thyroid gland is nonspecific by CT. No acute soft tissue abnormality. The lung apices are unremarkable.  IMPRESSION: CT BRAIN:  1. No acute intracranial abnormality. 2. Small soft tissue contusion overlying the left posterior occiput without evidence of underlying fracture. CT C-SPINE:  1. No acute fracture or malalignment. 2. Cervical spondylosis and facet arthropathy as above. 3. Nonspecific heterogeneous and mildly enlarged right thyroid gland is nonspecific by CT but favored to reflect goiter. If clinically warranted, ultrasound could further evaluate for nodules.   Electronically Signed   By: Jacqulynn Cadet M.D.   On: 02/01/2013 16:49    1800:  Pt has been ambulatory in the ED with steady gait. Tol PO well without N/V. Neuro exam intact and unchanged. Wants to go home now. Tx symptomatically. Dx and testing d/w pt and family.  Questions answered.  Verb understanding, agreeable to d/c home with outpt f/u.   Alfonzo Feller, DO 02/04/13 1023

## 2013-02-02 ENCOUNTER — Telehealth: Payer: Self-pay | Admitting: *Deleted

## 2013-02-02 NOTE — Telephone Encounter (Signed)
Spoke to patient concerning lab/test results/instructions from provider. Patient understood.    

## 2013-02-02 NOTE — Telephone Encounter (Signed)
Message copied by Truett Mainland on Wed Feb 02, 2013  5:09 PM ------      Message from: [place] F      Created: Wed Feb 02, 2013  4:43 PM       Please let patient know that the lung tests suggest there may be a problem with her lungs ability to take in oxygen. She needs to be seen by a pulmonologist, please offer a referal to Dr Luan Pulling or if she would prefer to go to [facility].                  Carlyle Dolly MD ------

## 2013-02-07 ENCOUNTER — Ambulatory Visit: Payer: Self-pay

## 2013-03-04 ENCOUNTER — Ambulatory Visit: Payer: Medicare HMO | Admitting: Family Medicine

## 2013-03-07 ENCOUNTER — Other Ambulatory Visit: Payer: Self-pay

## 2013-03-07 ENCOUNTER — Other Ambulatory Visit: Payer: Self-pay | Admitting: Family Medicine

## 2013-03-08 MED ORDER — FERROUS SULFATE 325 (65 FE) MG PO TABS: 325.0000 mg | ORAL_TABLET | Freq: Every day | ORAL | Status: DC

## 2013-03-08 NOTE — Telephone Encounter (Signed)
She is due labs.

## 2013-03-10 ENCOUNTER — Ambulatory Visit (INDEPENDENT_AMBULATORY_CARE_PROVIDER_SITE_OTHER): Payer: Medicare HMO | Admitting: Family Medicine

## 2013-03-10 ENCOUNTER — Encounter: Payer: Self-pay | Admitting: Family Medicine

## 2013-03-10 VITALS — BP 120/58 | HR 77 | Temp 98.5°F | Ht 66.0 in | Wt 229.0 lb

## 2013-03-10 DIAGNOSIS — J209 Acute bronchitis, unspecified: Secondary | ICD-10-CM

## 2013-03-10 MED ORDER — HYDROCODONE-HOMATROPINE 5-1.5 MG/5ML PO SYRP: 5.0000 mL | ORAL_SOLUTION | Freq: Three times a day (TID) | ORAL | Status: DC | PRN

## 2013-03-10 MED ORDER — AMOXICILLIN 875 MG PO TABS: 875.0000 mg | ORAL_TABLET | Freq: Two times a day (BID) | ORAL | Status: DC

## 2013-03-10 NOTE — Progress Notes (Signed)
   Subjective:    Patient ID: [name], female    DOB: 12-15-1950, 62 y.o.   MRN: [id number]  HPI  This 62 y.o. female presents for evaluation of persistent cough for over 3 weeks.  Review of Systems .No chest pain, SOB, HA, dizziness, vision change, N/V, diarrhea, constipation, dysuria, urinary urgency or frequency, myalgias, arthralgias or rash.     Objective:   Physical Exam  Vital signs noted  Well developed well nourished female.  HEENT - Head atraumatic Normocephalic                Eyes - PERRLA, Conjuctiva - clear Sclera- Clear EOMI                Ears - EAC's Wnl TM's Wnl Gross Hearing WNL                Nose - Nares patent                 Throat - oropharanx wnl Respiratory - Lungs CTA bilateral Cardiac - RRR S1 and S2 without murmur GI - Abdomen soft Nontender and bowel sounds active x 4 Extremities - No edema. Neuro - Grossly intact.      Assessment & Plan:  Acute bronchitis - Plan: amoxicillin (AMOXIL) 875 MG tablet, HYDROcodone-homatropine (HYCODAN) 5-1.5 MG/5ML syrup  Push po fluids, rest, tylenol and motrin otc prn as directed for fever, arthralgias, and myalgias.  Follow up prn if sx's continue or persist.  Lysbeth Penner FNP

## 2013-03-14 ENCOUNTER — Ambulatory Visit (INDEPENDENT_AMBULATORY_CARE_PROVIDER_SITE_OTHER): Payer: Medicare HMO | Admitting: Family Medicine

## 2013-03-14 ENCOUNTER — Ambulatory Visit (INDEPENDENT_AMBULATORY_CARE_PROVIDER_SITE_OTHER): Payer: Medicare HMO

## 2013-03-14 ENCOUNTER — Encounter (INDEPENDENT_AMBULATORY_CARE_PROVIDER_SITE_OTHER): Payer: Self-pay

## 2013-03-14 ENCOUNTER — Encounter: Payer: Self-pay | Admitting: Family Medicine

## 2013-03-14 ENCOUNTER — Telehealth: Payer: Self-pay | Admitting: Nurse Practitioner

## 2013-03-14 VITALS — BP 109/62 | HR 75 | Temp 98.8°F | Ht 66.0 in | Wt 230.0 lb

## 2013-03-14 DIAGNOSIS — R252 Cramp and spasm: Secondary | ICD-10-CM

## 2013-03-14 DIAGNOSIS — J209 Acute bronchitis, unspecified: Secondary | ICD-10-CM

## 2013-03-14 DIAGNOSIS — J020 Streptococcal pharyngitis: Secondary | ICD-10-CM

## 2013-03-14 DIAGNOSIS — R059 Cough, unspecified: Secondary | ICD-10-CM

## 2013-03-14 DIAGNOSIS — J029 Acute pharyngitis, unspecified: Secondary | ICD-10-CM

## 2013-03-14 DIAGNOSIS — R05 Cough: Secondary | ICD-10-CM

## 2013-03-14 LAB — POCT RAPID STREP A (OFFICE): Rapid Strep A Screen: POSITIVE — AB

## 2013-03-14 MED ORDER — AMOXICILLIN-POT CLAVULANATE 875-125 MG PO TABS: 1.0000 | ORAL_TABLET | Freq: Two times a day (BID) | ORAL | Status: DC

## 2013-03-14 NOTE — Telephone Encounter (Signed)
appt given  

## 2013-03-14 NOTE — Progress Notes (Signed)
Subjective:    Patient ID: [name], female    DOB: 11-27-1950, 62 y.o.   MRN: [id number]  HPI Patient here today for recheck of cough, St , and muscle cramps.     Patient Active Problem List   Diagnosis Date Noted  . 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 End stage renal disease 12/13/2012  . Encounter for routine gynecological examination 09/30/2012  . 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 Heme positive stool 08/18/2012  . Psoriasis 07/26/2012  . DM type 2, uncontrolled, with neuropathy 04/29/2012  . Chest pain   . RLS (restless legs syndrome)   . Chronic kidney disease, stage IV (severe)   . Gout 11/22/2009  . Anemia, normocytic normochromic 07/24/2009  . Hypothyroidism 01/22/2009  . Hyperlipidemia 01/22/2009  . Obesity 01/22/2009  . Hypertension 01/22/2009  . Gastroesophageal reflux disease 01/22/2009   Outpatient Encounter Prescriptions as of 03/14/2013  Medication Sig  . allopurinol (ZYLOPRIM) 100 MG tablet Take 1 tablet (100 mg total) by mouth every morning.  Marland Kitchen amLODipine (NORVASC) 5 MG tablet Take 5 mg by mouth at bedtime.  Marland Kitchen amoxicillin (AMOXIL) 875 MG tablet Take 1 tablet (875 mg total) by mouth 2 (two) times daily.  . carvedilol (COREG) 25 MG tablet Take 1 tablet (25 mg total) by mouth 2 (two) times daily with a meal.  . citalopram (CELEXA) 40 MG tablet Take 40 mg by mouth at bedtime.  . clobetasol (TEMOVATE) 0.05 % external solution Apply 1 application topically daily. Applied to scalp for psoriasis  . ergocalciferol (VITAMIN D2) 50000 UNITS capsule Take 1 capsule (50,000 Units total) by mouth once a week. Monday  . ferrous sulfate 325 (65 FE) MG tablet Take 1 tablet (325 mg total) by mouth daily with breakfast.  . furosemide (LASIX) 40 MG tablet Take 1.5 tablets (60 mg total) by mouth daily.  Marland Kitchen gabapentin (NEURONTIN) 800 MG tablet Take 1,600-2,400 mg by mouth at bedtime.   Marland Kitchen HYDROcodone-homatropine (HYCODAN) 5-1.5 MG/5ML  syrup Take 5 mLs by mouth every 8 (eight) hours as needed for cough.  . insulin aspart (NOVOLOG FLEXPEN) 100 UNIT/ML injection Inject 40 Units into the skin 3 (three) times daily before meals.   . Insulin Glargine (LANTUS SOLOSTAR) 100 UNIT/ML SOPN Inject 41-48 Units into the skin 2 (two) times daily. 41 units in the morning and 43 units at night  . KLOR-CON M10 10 MEQ tablet Take 10 mEq by mouth daily.   Marland Kitchen levothyroxine (SYNTHROID, LEVOTHROID) 50 MCG tablet Take 50 mcg by mouth every evening.  . linagliptin (TRADJENTA) 5 MG TABS tablet Take 5 mg by mouth daily.  Marland Kitchen lisinopril (PRINIVIL,ZESTRIL) 5 MG tablet Take 1 tablet (5 mg total) by mouth daily.  . nitroGLYCERIN (NITROSTAT) 0.4 MG SL tablet Place 1 tablet (0.4 mg total) under the tongue every 5 (five) minutes as needed for chest pain.  Marland Kitchen omeprazole (PRILOSEC) 20 MG capsule Take 1 capsule (20 mg total) by mouth every morning.  . pravastatin (PRAVACHOL) 40 MG tablet TAKE ONE TABLET BY MOUTH ONCE DAILY AT BEDTIME  . rOPINIRole (REQUIP) 1 MG tablet Take 1 tablet (1 mg total) by mouth at bedtime.    Review of Systems  Constitutional: Negative.   HENT: Positive for congestion and sore throat.   Eyes: Negative.   Respiratory: Positive for cough and shortness of breath.   Cardiovascular: Negative.   Gastrointestinal: Negative.   Endocrine: Negative.   Genitourinary: Negative.   Musculoskeletal: Positive for  myalgias.  Skin: Negative.   Allergic/Immunologic: Negative.   Neurological: Negative.   Hematological: Negative.   Psychiatric/Behavioral: Negative.        Objective:   Physical Exam  Nursing note and vitals reviewed. Constitutional: She is oriented to person, place, and time. She appears well-developed and well-nourished. She appears distressed.  HENT:  Head: Normocephalic and atraumatic.  Right Ear: External ear normal.  Left Ear: External ear normal.  Nasal congestion bilaterally, is somewhat red and swollen posteriorly    Eyes: Conjunctivae and EOM are normal. Pupils are equal, round, and reactive to light. Right eye exhibits no discharge. Left eye exhibits no discharge. No scleral icterus.  Neck: Normal range of motion. Neck supple. No tracheal deviation (minimal adenopathy on the left side) present. No thyromegaly present.  Cardiovascular: Normal rate, regular rhythm and normal heart sounds.  Exam reveals no gallop and no friction rub.   No murmur heard. Pulmonary/Chest: Effort normal. No respiratory distress. She has no wheezes. She has no rales.  Bronchial congestion with coughing  Musculoskeletal: Normal range of motion.  Lymphadenopathy:    She has cervical adenopathy.  Neurological: She is alert and oriented to person, place, and time. She has normal reflexes.  Skin: Skin is warm and dry. No rash noted.  Psychiatric: She has a normal mood and affect. Her behavior is normal. Judgment and thought content normal.   BP 109/62  Pulse 75  Temp(Src) 98.8 F (37.1 C) (Oral)  Ht 5\' 6"  (1.676 m)  Wt 230 lb (104.327 kg)  BMI 37.14 kg/m2  Results for orders placed in visit on 03/14/13  POCT RAPID STREP A (OFFICE)      Result Value Range   Rapid Strep A Screen Positive (*) Negative   [facility] reading (PRIMARY) by  Dr. Laurance Flatten; chest x-ray shows increased bronchial markings                                        Assessment & Plan:  1. Cough - DG Chest 2 View; Future  2. Sore throat - POCT rapid strep A  3. Streptococcal sore throat - amoxicillin-clavulanate (AUGMENTIN) 875-125 MG per tablet; Take 1 tablet by mouth 2 (two) times daily.  Dispense: 20 tablet; Refill: 0  4. Acute bronchitis - amoxicillin-clavulanate (AUGMENTIN) 875-125 MG per tablet; Take 1 tablet by mouth 2 (two) times daily.  Dispense: 20 tablet; Refill: 0    Orders Placed This Encounter  Procedures  . DG Chest 2 View    Standing Status: Future     Number of Occurrences: 1     Standing Expiration Date: 05/14/2014    Order  Specific Question:  Reason for Exam (SYMPTOM  OR DIAGNOSIS REQUIRED)    Answer:  cough    Order Specific Question:  Preferred imaging location?    Answer:  Internal  . POCT rapid strep A   Meds ordered this encounter  Medications  . amoxicillin-clavulanate (AUGMENTIN) 875-125 MG per tablet    Sig: Take 1 tablet by mouth 2 (two) times daily.    Dispense:  20 tablet    Refill:  0   Patient Instructions   Take Mucinex maximum strength one twice daily, blue and quite, over-the-counter with a large glass of water  Continue to use the inhaler but make sure you weren't sure mouth out after using it Drink plenty of fluids Use saline nose spray frequently through  the day,Ayr Discontinue amoxicillin and take new antibiotic as directed You may continue to use the medicine for severe cough at bedtime   Arrie Senate MD

## 2013-03-14 NOTE — Addendum Note (Signed)
Addended by: Zannie Cove on: 03/14/2013 06:02 PM   Modules accepted: Orders

## 2013-03-14 NOTE — Patient Instructions (Signed)
Take Mucinex maximum strength one twice daily, blue and quite, over-the-counter with a large glass of water  Continue to use the inhaler but make sure you weren't sure mouth out after using it Drink plenty of fluids Use saline nose spray frequently through the day,Ayr Discontinue amoxicillin and take new antibiotic as directed You may continue to use the medicine for severe cough at bedtime

## 2013-03-15 ENCOUNTER — Other Ambulatory Visit (INDEPENDENT_AMBULATORY_CARE_PROVIDER_SITE_OTHER): Payer: Medicare HMO

## 2013-03-15 DIAGNOSIS — R05 Cough: Secondary | ICD-10-CM

## 2013-03-15 DIAGNOSIS — R252 Cramp and spasm: Secondary | ICD-10-CM

## 2013-03-15 DIAGNOSIS — R059 Cough, unspecified: Secondary | ICD-10-CM

## 2013-03-15 LAB — POCT CBC
Granulocyte percent: 78.9 %G (ref 37–80)
HCT, POC: 42.3 % (ref 37.7–47.9)
Hemoglobin: 13.2 g/dL (ref 12.2–16.2)
Lymph, poc: 2.2 (ref 0.6–3.4)
MCH, POC: 26.4 pg — AB (ref 27–31.2)
MCHC: 31.2 g/dL — AB (ref 31.8–35.4)
MPV: 8.3 fL (ref 0–99.8)
WBC: 11.3 10*3/uL — AB (ref 4.6–10.2)

## 2013-03-15 NOTE — Progress Notes (Signed)
Patient came in for labs only.

## 2013-03-16 ENCOUNTER — Telehealth: Payer: Self-pay

## 2013-03-16 LAB — BMP8+EGFR
BUN/Creatinine Ratio: 19 (ref 11–26)
Calcium: 9.3 mg/dL (ref 8.6–10.2)
GFR calc Af Amer: 26 mL/min/{1.73_m2} — ABNORMAL LOW (ref >59)
GFR calc non Af Amer: 23 mL/min/{1.73_m2} — ABNORMAL LOW (ref >59)
Potassium: 4.1 mmol/L (ref 3.5–5.2)
Sodium: 134 mmol/L (ref 134–144)

## 2013-03-16 LAB — SPECIMEN STATUS REPORT

## 2013-03-16 NOTE — Telephone Encounter (Signed)
Phone number out of service

## 2013-03-22 ENCOUNTER — Other Ambulatory Visit: Payer: Self-pay | Admitting: Family Medicine

## 2013-04-01 ENCOUNTER — Ambulatory Visit (INDEPENDENT_AMBULATORY_CARE_PROVIDER_SITE_OTHER): Payer: Medicare HMO | Admitting: Physician Assistant

## 2013-04-01 VITALS — BP 131/67 | HR 76 | Temp 98.4°F | Ht 66.0 in | Wt 227.0 lb

## 2013-04-01 DIAGNOSIS — R059 Cough, unspecified: Secondary | ICD-10-CM

## 2013-04-01 DIAGNOSIS — R05 Cough: Secondary | ICD-10-CM

## 2013-04-01 NOTE — Progress Notes (Signed)
   Subjective:    Patient ID: [name], female    DOB: 05-Dec-1950, 63 y.o.   MRN: [id number]  HPI 63 y/o female recently diagnosed with emphysema presents with continuing cough. She was dx w/ bronchitis a month ago and treated with antibiotic but feels that she is not improving. Has been using LABA in addition to SABA this week with no relief. Saw her Pulmonologist on 4 days ago.     Review of Systems  Constitutional: Negative.   HENT: Negative.   Respiratory: Positive for cough, chest tightness and wheezing. Negative for apnea, choking, shortness of breath and stridor.   Cardiovascular: Negative.   Neurological: Negative.        Objective:   Physical Exam  Constitutional: She is oriented to person, place, and time. She appears well-developed and well-nourished. No distress.  Cardiovascular: Normal rate, regular rhythm and normal heart sounds.  Exam reveals no gallop and no friction rub.   No murmur heard. Pulmonary/Chest: Effort normal. No respiratory distress. She has wheezes (occasional expiratory wheezes heard throughout. Mildly cleared with cough. Otherwise, BS are CTA). She exhibits no tenderness.  O2 95% resting  Musculoskeletal: She exhibits no edema.  Neurological: She is alert and oriented to person, place, and time. She has normal reflexes.  Skin: She is not diaphoretic.          Assessment & Plan:  . 1. Cough d/t Chronic Emphysema: Advised patient to continue using inhalers as prescribed and to follow up with her Pulmonologist on Monday to adjust medications if she is still having problems. At this time, I do not think further medical treatment is needed. Patient is not a candidate for steroid dose pak or injection due to recent dx as a diabetic and elevated FBS.

## 2013-04-02 LAB — BMP8+EGFR
BUN/Creatinine Ratio: 16 (ref 11–26)
BUN: 29 mg/dL — AB (ref 8–27)
CALCIUM: 9.8 mg/dL (ref 8.6–10.2)
CHLORIDE: 97 mmol/L (ref 97–108)
CO2: 25 mmol/L (ref 18–29)
Creatinine, Ser: 1.84 mg/dL — ABNORMAL HIGH (ref 0.57–1.00)
GFR calc Af Amer: 33 mL/min/{1.73_m2} — ABNORMAL LOW (ref >59)
GFR calc non Af Amer: 29 mL/min/{1.73_m2} — ABNORMAL LOW (ref >59)
GLUCOSE: 66 mg/dL (ref 65–99)
POTASSIUM: 4 mmol/L (ref 3.5–5.2)
Sodium: 138 mmol/L (ref 134–144)

## 2013-04-06 ENCOUNTER — Other Ambulatory Visit: Payer: Self-pay

## 2013-04-06 MED ORDER — INSULIN GLARGINE 100 UNIT/ML SOLOSTAR PEN: 41.0000 [IU] | PEN_INJECTOR | Freq: Two times a day (BID) | SUBCUTANEOUS | Status: DC

## 2013-04-06 MED ORDER — INSULIN ASPART 100 UNIT/ML ~~LOC~~ SOLN: 40.0000 [IU] | Freq: Three times a day (TID) | SUBCUTANEOUS | Status: DC

## 2013-04-11 ENCOUNTER — Telehealth: Payer: Self-pay | Admitting: Family Medicine

## 2013-04-11 ENCOUNTER — Other Ambulatory Visit: Payer: Self-pay | Admitting: Family Medicine

## 2013-04-11 ENCOUNTER — Other Ambulatory Visit: Payer: Self-pay

## 2013-04-11 MED ORDER — FERROUS SULFATE 325 (65 FE) MG PO TABS: 325.0000 mg | ORAL_TABLET | Freq: Every day | ORAL | Status: DC

## 2013-04-11 NOTE — Telephone Encounter (Signed)
Message copied by Waverly Ferrari on Mon Apr 11, 2013 12:18 PM ------      Message from: Chipper Herb      Created: Sat Apr 02, 2013 11:30 AM       The creatinine does still elevated is 1.84 and this is consistent with readings over the past 8 months. The potassium and other electrolytes are within normal limit ------

## 2013-04-14 NOTE — Telephone Encounter (Signed)
Call patient : Prescription refilled & sent to pharmacy in [place]. 

## 2013-04-27 ENCOUNTER — Encounter: Payer: Self-pay | Admitting: General Practice

## 2013-04-27 ENCOUNTER — Telehealth: Payer: Self-pay | Admitting: Cardiology

## 2013-04-27 ENCOUNTER — Other Ambulatory Visit: Payer: Self-pay | Admitting: Cardiology

## 2013-04-27 ENCOUNTER — Other Ambulatory Visit: Payer: Self-pay | Admitting: Family Medicine

## 2013-04-27 ENCOUNTER — Ambulatory Visit (INDEPENDENT_AMBULATORY_CARE_PROVIDER_SITE_OTHER): Payer: Medicare HMO | Admitting: General Practice

## 2013-04-27 VITALS — BP 132/69 | HR 77 | Temp 98.7°F | Ht 66.0 in | Wt 227.0 lb

## 2013-04-27 DIAGNOSIS — IMO0001 Reserved for inherently not codable concepts without codable children: Secondary | ICD-10-CM

## 2013-04-27 DIAGNOSIS — B373 Candidiasis of vulva and vagina: Secondary | ICD-10-CM

## 2013-04-27 DIAGNOSIS — N390 Urinary tract infection, site not specified: Secondary | ICD-10-CM

## 2013-04-27 DIAGNOSIS — R350 Frequency of micturition: Secondary | ICD-10-CM

## 2013-04-27 DIAGNOSIS — B3731 Acute candidiasis of vulva and vagina: Secondary | ICD-10-CM

## 2013-04-27 LAB — POCT URINALYSIS DIPSTICK
Bilirubin, UA: NEGATIVE
Ketones, UA: NEGATIVE
Nitrite, UA: NEGATIVE
PH UA: 5
Spec Grav, UA: 1.01
UROBILINOGEN UA: NEGATIVE

## 2013-04-27 LAB — POCT UA - MICROSCOPIC ONLY
CASTS, UR, LPF, POC: NEGATIVE
CRYSTALS, UR, HPF, POC: NEGATIVE
Mucus, UA: NEGATIVE
YEAST UA: NEGATIVE

## 2013-04-27 MED ORDER — CLOTRIMAZOLE 1 % VA CREA: 1.0000 | TOPICAL_CREAM | Freq: Two times a day (BID) | VAGINAL | Status: AC

## 2013-04-27 MED ORDER — CIPROFLOXACIN HCL 500 MG PO TABS: 500.0000 mg | ORAL_TABLET | Freq: Two times a day (BID) | ORAL | Status: DC

## 2013-04-27 MED ORDER — FLUCONAZOLE 150 MG PO TABS: 150.0000 mg | ORAL_TABLET | Freq: Once | ORAL | Status: DC

## 2013-04-27 MED ORDER — FUROSEMIDE 40 MG PO TABS: ORAL_TABLET | ORAL | Status: DC

## 2013-04-27 NOTE — Patient Instructions (Signed)
Urinary Tract Infection  Urinary tract infections (UTIs) can develop anywhere along your urinary tract. Your urinary tract is your body's drainage system for removing wastes and extra water. Your urinary tract includes two kidneys, two ureters, a bladder, and a urethra. Your kidneys are a pair of bean-shaped organs. Each kidney is about the size of your fist. They are located below your ribs, one on each side of your spine.  CAUSES  Infections are caused by microbes, which are microscopic organisms, including fungi, viruses, and bacteria. These organisms are so small that they can only be seen through a microscope. Bacteria are the microbes that most commonly cause UTIs.  SYMPTOMS   Symptoms of UTIs may vary by age and gender of the patient and by the location of the infection. Symptoms in young women typically include a frequent and intense urge to urinate and a painful, burning feeling in the bladder or urethra during urination. Older women and men are more likely to be tired, shaky, and weak and have muscle aches and abdominal pain. A fever may mean the infection is in your kidneys. Other symptoms of a kidney infection include pain in your back or sides below the ribs, nausea, and vomiting.  DIAGNOSIS  To diagnose a UTI, your caregiver will ask you about your symptoms. Your caregiver also will ask to provide a urine sample. The urine sample will be tested for bacteria and white blood cells. White blood cells are made by your body to help fight infection.  TREATMENT   Typically, UTIs can be treated with medication. Because most UTIs are caused by a bacterial infection, they usually can be treated with the use of antibiotics. The choice of antibiotic and length of treatment depend on your symptoms and the type of bacteria causing your infection.  HOME CARE INSTRUCTIONS   If you were prescribed antibiotics, take them exactly as your caregiver instructs you. Finish the medication even if you feel better after you  have only taken some of the medication.   Drink enough water and fluids to keep your urine clear or pale yellow.   Avoid caffeine, tea, and carbonated beverages. They tend to irritate your bladder.   Empty your bladder often. Avoid holding urine for long periods of time.   Empty your bladder before and after sexual intercourse.   After a bowel movement, women should cleanse from front to back. Use each tissue only once.  SEEK MEDICAL CARE IF:    You have back pain.   You develop a fever.   Your symptoms do not begin to resolve within 3 days.  SEEK IMMEDIATE MEDICAL CARE IF:    You have severe back pain or lower abdominal pain.   You develop chills.   You have nausea or vomiting.   You have continued burning or discomfort with urination.  MAKE SURE YOU:    Understand these instructions.   Will watch your condition.   Will get help right away if you are not doing well or get worse.  Document Released: 12/18/2004 Document Revised: 09/09/2011 Document Reviewed: 04/18/2011  [employer] Patient Information 2014 [employer].

## 2013-04-27 NOTE — Telephone Encounter (Signed)
Refill given on omeprazole and patient aware need to schedule appointment for chronic health recheck.

## 2013-04-27 NOTE — Telephone Encounter (Signed)
Received fax refill request  Rx # [id number] Medication:  Furosemide 40 mg tab Qty 45 Sig:  Take one and one-half tablets by mouth once daily Physician:  Lattie Haw

## 2013-04-27 NOTE — Telephone Encounter (Signed)
Medication sent via escribe.  

## 2013-04-27 NOTE — Progress Notes (Signed)
   Subjective:    Patient ID: [name], female    DOB: November 01, 1950, 63 y.o.   MRN: [id number]  Dysuria  This is a new problem. The current episode started more than 1 month ago. The problem occurs every urination. The problem has been unchanged. The quality of the pain is described as burning. The pain is at a severity of 8/10. There has been no fever. She is not sexually active. There is no history of pyelonephritis. Associated symptoms include flank pain and urgency. Pertinent negatives include no chills, hematuria or nausea. She has tried nothing for the symptoms. Her past medical history is significant for a urological procedure. There is no history of kidney stones or recurrent UTIs.      Review of Systems  Constitutional: Negative for fever and chills.  Respiratory: Negative for chest tightness and shortness of breath.   Cardiovascular: Negative for chest pain and palpitations.  Gastrointestinal: Negative for nausea and abdominal pain.  Genitourinary: Positive for dysuria, urgency and flank pain. Negative for hematuria.       Objective:   Physical Exam  Constitutional: She is oriented to person, place, and time. She appears well-developed and well-nourished.  Cardiovascular: Normal rate, regular rhythm and normal heart sounds.   Pulmonary/Chest: Effort normal and breath sounds normal.  Abdominal: Soft. Bowel sounds are normal. She exhibits no distension. There is tenderness in the suprapubic area. There is no CVA tenderness.  Neurological: She is alert and oriented to person, place, and time.  Skin: Skin is warm and dry.  Psychiatric: She has a normal mood and affect.          Assessment & Plan:  1. Frequency  - POCT urinalysis dipstick - POCT UA - Microscopic Only - Urine culture  2. UTI (urinary tract infection)  - ciprofloxacin (CIPRO) 500 MG tablet; Take 1 tablet (500 mg total) by mouth 2 (two) times daily.  Dispense: 20 tablet; Refill: 0  3. Vulvovaginal  candidiasis - fluconazole (DIFLUCAN) 150 MG tablet; Take 1 tablet (150 mg total) by mouth once. May repeat in 3 days  Dispense: 2 tablet; Refill: 0 - clotrimazole (GYNE-LOTRIMIN) 1 % vaginal cream; Place 1 Applicatorful vaginally 2 (two) times daily.  Dispense: 45 g; Refill: 1 -RTO if symptoms worsen or unresolved Patient verbalized understanding Erby Pian, FNP-C

## 2013-04-29 LAB — URINE CULTURE

## 2013-05-02 ENCOUNTER — Other Ambulatory Visit: Payer: Self-pay | Admitting: General Practice

## 2013-05-26 ENCOUNTER — Other Ambulatory Visit: Payer: Self-pay | Admitting: Family Medicine

## 2013-05-27 NOTE — Telephone Encounter (Signed)
Patient needs to be seen. Has exceeded time since last visit with PCP. Has been here for minor acute problems. Limited quantity refilled. Needs to bring all medications to next appointment.

## 2013-05-30 ENCOUNTER — Other Ambulatory Visit: Payer: Self-pay

## 2013-05-30 MED ORDER — LEVOTHYROXINE SODIUM 50 MCG PO TABS: 50.0000 ug | ORAL_TABLET | Freq: Every evening | ORAL | Status: DC

## 2013-06-15 ENCOUNTER — Other Ambulatory Visit: Payer: Self-pay | Admitting: Family Medicine

## 2013-07-01 ENCOUNTER — Other Ambulatory Visit: Payer: Self-pay | Admitting: Family Medicine

## 2013-07-03 NOTE — Telephone Encounter (Signed)
Patient needs to be seen. Has exceeded time since last visit. Limited quantity refilled. Needs to bring all medications to next appointment.   

## 2013-07-03 NOTE — Telephone Encounter (Signed)
Patient needs to be seen.  Has exceeded time since last visit for follow up on her diseases. She has been here for acute problems. She needs to establish with a new provider since I won't be here. She needs to check with her kidney doctors on the appropriate doses on her medications if any adjustments are needed with her kidney diesease.. Limited quantity refilled. Needs to bring all medications to next appointment.

## 2013-07-04 ENCOUNTER — Encounter: Payer: Self-pay | Admitting: Family Medicine

## 2013-07-04 ENCOUNTER — Ambulatory Visit (INDEPENDENT_AMBULATORY_CARE_PROVIDER_SITE_OTHER): Payer: Medicare HMO | Admitting: Family Medicine

## 2013-07-04 VITALS — BP 136/70 | HR 78 | Temp 98.5°F | Ht 66.0 in | Wt 221.4 lb

## 2013-07-04 DIAGNOSIS — R7989 Other specified abnormal findings of blood chemistry: Secondary | ICD-10-CM

## 2013-07-04 DIAGNOSIS — E785 Hyperlipidemia, unspecified: Secondary | ICD-10-CM

## 2013-07-04 DIAGNOSIS — E559 Vitamin D deficiency, unspecified: Secondary | ICD-10-CM

## 2013-07-04 DIAGNOSIS — M109 Gout, unspecified: Secondary | ICD-10-CM

## 2013-07-04 DIAGNOSIS — I770 Arteriovenous fistula, acquired: Secondary | ICD-10-CM

## 2013-07-04 DIAGNOSIS — E1142 Type 2 diabetes mellitus with diabetic polyneuropathy: Secondary | ICD-10-CM

## 2013-07-04 DIAGNOSIS — E039 Hypothyroidism, unspecified: Secondary | ICD-10-CM

## 2013-07-04 DIAGNOSIS — N184 Chronic kidney disease, stage 4 (severe): Secondary | ICD-10-CM

## 2013-07-04 DIAGNOSIS — E1149 Type 2 diabetes mellitus with other diabetic neurological complication: Secondary | ICD-10-CM

## 2013-07-04 DIAGNOSIS — F32A Depression, unspecified: Secondary | ICD-10-CM

## 2013-07-04 DIAGNOSIS — IMO0002 Reserved for concepts with insufficient information to code with codable children: Secondary | ICD-10-CM

## 2013-07-04 DIAGNOSIS — G2581 Restless legs syndrome: Secondary | ICD-10-CM

## 2013-07-04 DIAGNOSIS — F329 Major depressive disorder, single episode, unspecified: Secondary | ICD-10-CM

## 2013-07-04 DIAGNOSIS — E1140 Type 2 diabetes mellitus with diabetic neuropathy, unspecified: Secondary | ICD-10-CM

## 2013-07-04 DIAGNOSIS — E669 Obesity, unspecified: Secondary | ICD-10-CM

## 2013-07-04 DIAGNOSIS — E790 Hyperuricemia without signs of inflammatory arthritis and tophaceous disease: Secondary | ICD-10-CM

## 2013-07-04 DIAGNOSIS — F3289 Other specified depressive episodes: Secondary | ICD-10-CM

## 2013-07-04 DIAGNOSIS — I10 Essential (primary) hypertension: Secondary | ICD-10-CM

## 2013-07-04 DIAGNOSIS — E1165 Type 2 diabetes mellitus with hyperglycemia: Secondary | ICD-10-CM

## 2013-07-04 LAB — POCT URINALYSIS DIPSTICK
Bilirubin, UA: NEGATIVE
Glucose, UA: NEGATIVE
Ketones, UA: NEGATIVE
Nitrite, UA: NEGATIVE
Protein, UA: NEGATIVE
Spec Grav, UA: 1.01
Urobilinogen, UA: NEGATIVE
pH, UA: 5

## 2013-07-04 LAB — POCT UA - MICROSCOPIC ONLY
Bacteria, U Microscopic: NEGATIVE
Casts, Ur, LPF, POC: NEGATIVE
Crystals, Ur, HPF, POC: NEGATIVE
Mucus, UA: NEGATIVE
Yeast, UA: NEGATIVE

## 2013-07-04 LAB — GLUCOSE, POCT (MANUAL RESULT ENTRY): POC Glucose: 242 mg/dl — AB (ref 70–99)

## 2013-07-04 LAB — POCT GLYCOSYLATED HEMOGLOBIN (HGB A1C): Hemoglobin A1C: >14

## 2013-07-04 LAB — POCT UA - MICROALBUMIN: Microalbumin Ur, POC: 100 mg/L

## 2013-07-04 MED ORDER — PRAVASTATIN SODIUM 40 MG PO TABS: ORAL_TABLET | ORAL | Status: DC

## 2013-07-04 MED ORDER — INSULIN ASPART 100 UNIT/ML ~~LOC~~ SOLN: 40.0000 [IU] | Freq: Three times a day (TID) | SUBCUTANEOUS | Status: DC

## 2013-07-04 MED ORDER — TIOTROPIUM BROMIDE MONOHYDRATE 18 MCG IN CAPS: 18.0000 ug | ORAL_CAPSULE | Freq: Every morning | RESPIRATORY_TRACT | Status: DC

## 2013-07-04 MED ORDER — AMLODIPINE BESYLATE 5 MG PO TABS: 5.0000 mg | ORAL_TABLET | Freq: Every day | ORAL | Status: DC

## 2013-07-04 MED ORDER — OMEPRAZOLE 20 MG PO CPDR: DELAYED_RELEASE_CAPSULE | ORAL | Status: DC

## 2013-07-04 MED ORDER — ROPINIROLE HCL 1 MG PO TABS: 1.0000 mg | ORAL_TABLET | Freq: Every day | ORAL | Status: DC

## 2013-07-04 MED ORDER — CARVEDILOL 25 MG PO TABS: 25.0000 mg | ORAL_TABLET | Freq: Two times a day (BID) | ORAL | Status: DC

## 2013-07-04 MED ORDER — LEVOTHYROXINE SODIUM 50 MCG PO TABS: 50.0000 ug | ORAL_TABLET | Freq: Every evening | ORAL | Status: DC

## 2013-07-04 MED ORDER — POTASSIUM CHLORIDE CRYS ER 10 MEQ PO TBCR: EXTENDED_RELEASE_TABLET | ORAL | Status: DC

## 2013-07-04 MED ORDER — GABAPENTIN 800 MG PO TABS: ORAL_TABLET | ORAL | Status: DC

## 2013-07-04 MED ORDER — CITALOPRAM HYDROBROMIDE 40 MG PO TABS: ORAL_TABLET | ORAL | Status: DC

## 2013-07-04 MED ORDER — ALLOPURINOL 100 MG PO TABS: 100.0000 mg | ORAL_TABLET | Freq: Every morning | ORAL | Status: DC

## 2013-07-04 MED ORDER — ALBUTEROL SULFATE HFA 108 (90 BASE) MCG/ACT IN AERS: 2.0000 | INHALATION_SPRAY | Freq: Four times a day (QID) | RESPIRATORY_TRACT | Status: DC | PRN

## 2013-07-04 MED ORDER — FUROSEMIDE 40 MG PO TABS: 40.0000 mg | ORAL_TABLET | Freq: Two times a day (BID) | ORAL | Status: DC

## 2013-07-04 NOTE — Progress Notes (Signed)
Patient ID: [name], female   DOB: June 04, 1950, 63 y.o.   MRN: [id number] SUBJECTIVE: CC: Chief Complaint  Patient presents with  . Follow-up    needs referral for endrinologist.     HPI: Breakfast: egg sandwich with mayonaise with bread, tomato. Snack:nothing Lunch:none Supper: banana sandwich and mayonaise. The night before baked chicken Cheese at times.on the sandwiches.  Vegetables once in a while.  Exercise : walks 2 blocks a day. Rest of the day cleans house. Watches TV half of the day.  Patient is here for follow up of Diabetes Mellitus: Symptoms evaluated: Denies Nocturia ,Denies Urinary Frequency , denies Blurred vision ,deniesDizziness,denies.Dysuria,denies paresthesias, denies extremity pain or ulcers.Marland Kitchendenies chest pain. has had an annual eye exam. do check the feet. Does check CBGs. Average [id number] was up to over 600 recently at the nephrologists office. Denies episodes of hypoglycemia. Does have an emergency hypoglycemic plan. admits toCompliance with medications. Denies Problems with medications.  Past Medical History  Diagnosis Date  . Chest pain 2003    ?h/o MI, syncope; 2003 stress nuclear-inferoseptal scar, EF-54%; 2007-MI ruled out, echocardiogram-normal LV size, wall thickness and function  . RLS (restless legs syndrome)     sleep study in 2010: No apnea; moderate periodic movements  . GERD (gastroesophageal reflux disease)     H/o upper GI bleed  . GI bleeding   . Obese   . Gout   . Hypothyroidism   . Low back pain   . Cellulitis     Neck  . Diabetes mellitus 1998    A1c-7.1 in 2010, 6.7 in 2013  . Hyperlipidemia     Fasting lipids 2013:158, 152, 35, 93  . Hypertension 2002  . Anemia   . Hyperkalemia   . Irregular heart beat   . PONV (postoperative nausea and vomiting)   . Myocardial infarction   . Anginal pain     last cp few months ago  . CAD (coronary artery disease)     dr. Lattie Haw ([facility])  . Depression     Insomnia  .  Chronic kidney disease, stage III (moderate)     Creat-1.9 in 04/2008, 2.65 in 02/2010(noHD  . Headache(784.0)   . Vitiligo   . H/O hiatal hernia   . Arthritis   . Cancer     HX SKIN CANCER  . Renal insufficiency    Past Surgical History  Procedure Laterality Date  . Appendectomy    . Carpal tunnel release  1992, 1993, 2011    Right in 2011; previously bilateral  . Tubal ligation  1976  . Partial hysterectomy  1985    Vaginal  . Shoulder surgery  1997    Left  . Incise and drain abcess  2010    Neck-MRSA  . Colonoscopy  02/2002, 07/2009    few diverticula, otherwise negative  . Foot surgery  1984    Bilateral heel spurs  . Abdominal hysterectomy    . Shoulder surgery      RIGHT  ADJUSTED   . Av fistula placement Left 07/16/2012    Procedure: ARTERIOVENOUS (AV) FISTULA CREATION;  Surgeon: Mal Misty, MD;  Location: [facility];  Service: Vascular;  Laterality: Left;  . Av fistula placement Left 07/29/2012    Procedure: ARTERIOVENOUS (AV) FISTULA CREATION;  Surgeon: Mal Misty, MD;  Location: [place] [facility];  Service: Vascular;  Laterality: Left;  Left Brachial-Cephalic Arteriovenous Fistula Creation  . Colonoscopy with esophagogastroduodenoscopy (egd) N/A 08/27/2012    RMR: Normal esophagus s/p  3 F dilator. Small hiatal hernia. Gastric polyp removed, oozing (hyperplastic), which could explain heme positive stool:Colonic diverticulosis   History   Social History  . Marital Status: Divorced    Spouse Name: N/A    Number of Children: 3  . Years of Education: N/A   Occupational History  . Disabled     previously Nurse, adult   Social History Main Topics  . Smoking status: Never Smoker   . Smokeless tobacco: Never Used  . Alcohol Use: No  . Drug Use: No  . Sexual Activity: No   Other Topics Concern  . Not on file   Social History Narrative   No regular exercise   Family History  Problem Relation Age of Onset  . Emphysema Mother   . COPD Mother   . Colon cancer Father   .  Diabetes Father   . Cancer Father     colon  . Hypertension Father   . Congestive Heart Failure Maternal Grandmother   . Cancer Paternal Grandmother    Current Outpatient Prescriptions on File Prior to Visit  Medication Sig Dispense Refill  . clobetasol (TEMOVATE) 0.05 % external solution Apply 1 application topically daily. Applied to scalp for psoriasis      . ferrous sulfate 325 (65 FE) MG tablet Take 1 tablet (325 mg total) by mouth daily with breakfast.  30 tablet  5  . nitroGLYCERIN (NITROSTAT) 0.4 MG SL tablet Place 1 tablet (0.4 mg total) under the tongue every 5 (five) minutes as needed for chest pain.  50 tablet  0  . Vitamin D, Ergocalciferol, (DRISDOL) 50000 UNITS CAPS capsule TAKE ONE CAPSULE BY MOUTH ONCE A WEEK ON  MONDAY  4 capsule  0  . ciprofloxacin (CIPRO) 500 MG tablet Take 1 tablet (500 mg total) by mouth 2 (two) times daily.  20 tablet  0  . fluconazole (DIFLUCAN) 150 MG tablet Take 1 tablet (150 mg total) by mouth once. May repeat in 3 days  2 tablet  0   No current facility-administered medications on file prior to visit.   Allergies  Allergen Reactions  . Latex Shortness Of Breath and Swelling    Facial swelling  . Vancomycin Hcl Swelling    REACTION: facial swelling  . Quinapril Hcl Swelling and Rash    Arm swelling   Immunization History  Administered Date(s) Administered  . Tdap 10/03/2012   Prior to Admission medications   Medication Sig Start Date End Date Taking? Authorizing Provider  allopurinol (ZYLOPRIM) 100 MG tablet Take 1 tablet (100 mg total) by mouth every morning. 09/30/12  Yes Vernie Shanks, MD  amLODipine (NORVASC) 5 MG tablet Take 5 mg by mouth at bedtime.   Yes Historical Provider, MD  carvedilol (COREG) 25 MG tablet Take 1 tablet (25 mg total) by mouth 2 (two) times daily with a meal. 09/06/12  Yes Yehuda Savannah, MD  citalopram (CELEXA) 40 MG tablet TAKE ONE TABLET BY MOUTH ONCE DAILY AT BEDTIME    Yes Vernie Shanks, MD  clobetasol  (TEMOVATE) 0.05 % external solution Apply 1 application topically daily. Applied to scalp for psoriasis 12/20/12  Yes Historical Provider, MD  ferrous sulfate 325 (65 FE) MG tablet Take 1 tablet (325 mg total) by mouth daily with breakfast. 04/11/13  Yes Orvil Feil, NP  furosemide (LASIX) 40 MG tablet 40 mg. TAKE ONE & ONE-HALF TABLETS BY MOUTH ONCE DAILY 04/27/13  Yes Arnoldo Lenis, MD  gabapentin (NEURONTIN) 800 MG tablet  TAKE THREE TABLETS BY MOUTH ONCE DAILY AT BEDTIME   Yes Vernie Shanks, MD  insulin aspart (NOVOLOG FLEXPEN) 100 UNIT/ML injection Inject 40 Units into the skin 3 (three) times daily before meals. 04/06/13  Yes Vernie Shanks, MD  Insulin Glargine (LANTUS SOLOSTAR) 100 UNIT/ML Solostar Pen Inject 41-48 Units into the skin 2 (two) times daily. 41 units in the morning and 43 units at night 04/06/13  Yes Chipper Herb, MD  KLOR-CON M10 10 MEQ tablet TAKE ONE TABLET BY MOUTH TWICE DAILY 03/22/13  Yes Chipper Herb, MD  levothyroxine (SYNTHROID, LEVOTHROID) 50 MCG tablet Take 1 tablet (50 mcg total) by mouth every evening. 05/30/13  Yes Mae Loree Fee, FNP  lisinopril (PRINIVIL,ZESTRIL) 5 MG tablet Take 1 tablet (5 mg total) by mouth daily. 09/06/12  Yes Yehuda Savannah, MD  losartan (COZAAR) 50 MG tablet  06/13/13  Yes Historical Provider, MD  nitroGLYCERIN (NITROSTAT) 0.4 MG SL tablet Place 1 tablet (0.4 mg total) under the tongue every 5 (five) minutes as needed for chest pain. 05/24/12  Yes Yehuda Savannah, MD  omeprazole (PRILOSEC) 20 MG capsule TAKE ONE CAPSULE BY MOUTH ONCE DAILY IN THE MORNING 04/27/13  Yes Mae Loree Fee, FNP  pravastatin (PRAVACHOL) 40 MG tablet TAKE ONE TABLET BY MOUTH ONCE DAILY AT BEDTIME   Yes Vernie Shanks, MD  PROAIR HFA 108 [phone] BASE) MCG/ACT inhaler  06/15/13  Yes Historical Provider, MD  rOPINIRole (REQUIP) 1 MG tablet Take 1 tablet (1 mg total) by mouth at bedtime. 09/30/12  Yes Vernie Shanks, MD  SPIRIVA HANDIHALER 18 MCG inhalation capsule  06/15/13   Yes Historical Provider, MD  Vitamin D, Ergocalciferol, (DRISDOL) 50000 UNITS CAPS capsule TAKE ONE CAPSULE BY MOUTH ONCE A WEEK ON  MONDAY   Yes Vernie Shanks, MD  ciprofloxacin (CIPRO) 500 MG tablet Take 1 tablet (500 mg total) by mouth 2 (two) times daily. 04/27/13   Erby Pian, FNP  fluconazole (DIFLUCAN) 150 MG tablet Take 1 tablet (150 mg total) by mouth once. May repeat in 3 days 04/27/13   Erby Pian, FNP     ROS: As above in the HPI. All other systems are stable or negative.  OBJECTIVE: APPEARANCE:  Patient in no acute distress.The patient appeared well nourished and normally developed. Acyanotic. Waist: VITAL SIGNS:BP 136/70  Pulse 78  Temp(Src) 98.5 F (36.9 C) (Oral)  Ht 5\' 6"  (1.676 m)  Wt 221 lb 6.4 oz (100.426 kg)  BMI 35.75 kg/m2  Obese WF  SKIN: warm and  Dry without overt rashes, tattoos and scars  HEAD and Neck: without JVD, Head and scalp: normal Eyes:No scleral icterus. Fundi normal, eye movements normal. Ears: Auricle normal, canal normal, Tympanic membranes normal, insufflation normal. Nose: normal Throat: normal Neck & thyroid: normal  CHEST & LUNGS: Chest wall: normal Lungs: Clear  CVS: Reveals the PMI to be normally located. Regular rhythm, First and Second Heart sounds are normal,  absence of murmurs, rubs or gallops. Peripheral vasculature: Radial pulses: normal Dorsal pedis pulses: normal Posterior pulses: normal  ABDOMEN:  Appearance: obese Benign, no organomegaly, no masses, no Abdominal Aortic enlargement. No Guarding , no rebound. No Bruits. Bowel sounds: normal  RECTAL: N/A GU: N/A  EXTREMETIES: nonedematous.  NEUROLOGIC: oriented to time,place and person; nonfocal. Cranial Nerves are normal.  ASSESSMENT: RLS (restless legs syndrome) - Plan: rOPINIRole (REQUIP) 1 MG tablet  Obesity  Hypertension - Plan: POCT urinalysis dipstick, POCT UA - Microscopic  Only  Hyperlipidemia - Plan: Lipid panel, Hepatic  function panel  DM type 2, uncontrolled, with neuropathy - Plan: POCT UA - Microalbumin, POCT glycosylated hemoglobin (Hb A1C), POCT glucose (manual entry), Insulin Glargine (LANTUS SOLOSTAR) 100 UNIT/ML Solostar Pen, Ambulatory referral to Endocrinology, insulin aspart (NOVOLOG FLEXPEN) 100 UNIT/ML injection, gabapentin (NEURONTIN) 800 MG tablet, Microalbumin, urine  Chronic kidney disease, stage IV (severe) - Plan: furosemide (LASIX) 40 MG tablet  A-V fistula  Hypothyroidism - Plan: TSH  Gout  Unspecified vitamin D deficiency - Plan: Vit D  25 hydroxy (rtn osteoporosis monitoring)  Hyperuricemia - Plan: allopurinol (ZYLOPRIM) 100 MG tablet  HTN (hypertension) - Plan: amLODipine (NORVASC) 5 MG tablet, carvedilol (COREG) 25 MG tablet, furosemide (LASIX) 40 MG tablet, potassium chloride (KLOR-CON M10) 10 MEQ tablet  Depression - Plan: citalopram (CELEXA) 40 MG tablet  PLAN:  Orders Placed This Encounter  Procedures  . Lipid panel  . Hepatic function panel  . Vit D  25 hydroxy (rtn osteoporosis monitoring)  . TSH  . Microalbumin, urine  . Ambulatory referral to Endocrinology    Referral Priority:  Urgent    Referral Type:  Consultation    Referral Reason:  Specialty Services Required    Number of Visits Requested:  1  . POCT UA - Microalbumin  . POCT urinalysis dipstick  . POCT UA - Microscopic Only  . POCT glycosylated hemoglobin (Hb A1C)  . POCT glucose (manual entry)   Results for orders placed in visit on 07/04/13  POCT UA - MICROALBUMIN      Result Value Ref Range   Microalbumin Ur, POC 100    POCT URINALYSIS DIPSTICK      Result Value Ref Range   Color, UA yellow     Clarity, UA clear     Glucose, UA neg     Bilirubin, UA neg     Ketones, UA neg     Spec Grav, UA 1.010     Blood, UA trace     pH, UA 5.0     Protein, UA neg     Urobilinogen, UA negative     Nitrite, UA neg     Leukocytes, UA moderate (2+)    POCT UA - MICROSCOPIC ONLY      Result Value  Ref Range   WBC, Ur, HPF, POC 10-15     RBC, urine, microscopic 1-5     Bacteria, U Microscopic neg     Mucus, UA neg     Epithelial cells, urine per micros occ     Crystals, Ur, HPF, POC neg     Casts, Ur, LPF, POC neg     Yeast, UA neg    POCT GLYCOSYLATED HEMOGLOBIN (HGB A1C)      Result Value Ref Range   Hemoglobin A1C >14.0    GLUCOSE, POCT (MANUAL RESULT ENTRY)      Result Value Ref Range   POC Glucose 242 (*) 70 - 99 mg/dl    Meds ordered this encounter  Medications  . DISCONTD: PROAIR HFA 108 (90 BASE) MCG/ACT inhaler    Sig:   . losartan (COZAAR) 50 MG tablet    Sig:   . DISCONTD: SPIRIVA HANDIHALER 18 MCG inhalation capsule    Sig:   . DISCONTD: furosemide (LASIX) 40 MG tablet    Sig: 40 mg. TAKE ONE & ONE-HALF TABLETS BY MOUTH ONCE DAILY  . Insulin Glargine (LANTUS SOLOSTAR) 100 UNIT/ML Solostar Pen    Sig: 45 units in the morning  and 45 units at night    Dispense:  30 mL    Refill:  2  . insulin aspart (NOVOLOG FLEXPEN) 100 UNIT/ML injection    Sig: Inject 40 Units into the skin 3 (three) times daily before meals.    Dispense:  10 mL    Refill:  2  . allopurinol (ZYLOPRIM) 100 MG tablet    Sig: Take 1 tablet (100 mg total) by mouth every morning.    Dispense:  30 tablet    Refill:  11  . amLODipine (NORVASC) 5 MG tablet    Sig: Take 1 tablet (5 mg total) by mouth daily.    Dispense:  30 tablet    Refill:  5  . carvedilol (COREG) 25 MG tablet    Sig: Take 1 tablet (25 mg total) by mouth 2 (two) times daily with a meal.    Dispense:  60 tablet    Refill:  6  . citalopram (CELEXA) 40 MG tablet    Sig: TAKE ONE TABLET BY MOUTH ONCe daily    Dispense:  30 tablet    Refill:  3    Last refill, until seen.  . furosemide (LASIX) 40 MG tablet    Sig: Take 1 tablet (40 mg total) by mouth 2 (two) times daily.    Dispense:  60 tablet    Refill:  5  . gabapentin (NEURONTIN) 800 MG tablet    Sig: TAKE THREE TABLETS BY MOUTH ONCE DAILY AT BEDTIME    Dispense:  90  tablet    Refill:  5    Last refill. Needs to be seen by a new provider.  . potassium chloride (KLOR-CON M10) 10 MEQ tablet    Sig: TAKE ONE TABLET BY MOUTH TWICE DAILY    Dispense:  30 tablet    Refill:  5  . levothyroxine (SYNTHROID, LEVOTHROID) 50 MCG tablet    Sig: Take 1 tablet (50 mcg total) by mouth every evening.    Dispense:  30 tablet    Refill:  2  . omeprazole (PRILOSEC) 20 MG capsule    Sig: TAKE ONE CAPSULE BY MOUTH ONCE DAILY IN THE MORNING    Dispense:  30 capsule    Refill:  2  . pravastatin (PRAVACHOL) 40 MG tablet    Sig: TAKE ONE TABLET BY MOUTH ONCE DAILY AT BEDTIME    Dispense:  30 tablet    Refill:  3    Needs to be seen before next refill  . albuterol (PROAIR HFA) 108 (90 BASE) MCG/ACT inhaler    Sig: Inhale 2 puffs into the lungs every 6 (six) hours as needed for wheezing or shortness of breath.    Dispense:  1 Inhaler    Refill:  1  . rOPINIRole (REQUIP) 1 MG tablet    Sig: Take 1 tablet (1 mg total) by mouth at bedtime.    Dispense:  30 tablet    Refill:  11  . tiotropium (SPIRIVA HANDIHALER) 18 MCG inhalation capsule    Sig: Place 1 capsule (18 mcg total) into inhaler and inhale every morning.    Dispense:  30 capsule    Refill:  5   Medications Discontinued During This Encounter  Medication Reason  . citalopram (CELEXA) 40 MG tablet Duplicate  . furosemide (LASIX) 40 MG tablet   . lisinopril (PRINIVIL,ZESTRIL) 5 MG tablet Completed Course  . Insulin Glargine (LANTUS SOLOSTAR) 100 UNIT/ML Solostar Pen Reorder  . insulin aspart (NOVOLOG FLEXPEN) 100 UNIT/ML injection  Reorder  . allopurinol (ZYLOPRIM) 100 MG tablet Reorder  . amLODipine (NORVASC) 5 MG tablet Reorder  . carvedilol (COREG) 25 MG tablet Reorder  . citalopram (CELEXA) 40 MG tablet Reorder  . furosemide (LASIX) 40 MG tablet Reorder  . gabapentin (NEURONTIN) 800 MG tablet Reorder  . KLOR-CON M10 10 MEQ tablet Reorder  . levothyroxine (SYNTHROID, LEVOTHROID) 50 MCG tablet Reorder  .  omeprazole (PRILOSEC) 20 MG capsule Reorder  . pravastatin (PRAVACHOL) 40 MG tablet Reorder  . PROAIR HFA 108 (90 BASE) MCG/ACT inhaler Reorder  . rOPINIRole (REQUIP) 1 MG tablet Reorder  . SPIRIVA HANDIHALER 18 MCG inhalation capsule Reorder   Return in about 2 days (around 07/06/2013), or 2 months appointment with new PCP., for with Tammy to review her insulin and sugars.  Margaret Cockerill P. Jacelyn Grip, M.D.

## 2013-07-04 NOTE — Telephone Encounter (Signed)
Left message with Dr. Jodi Mourning recommendations.

## 2013-07-04 NOTE — Patient Instructions (Signed)
Vegetarian Diets Many foods in the vegetarian diet (nuts, legumes, seeds, whole grains, and fresh fruits and vegetables) contain large amounts of fiber, which give a feeling of fullness (satiation) after meals. Vegetarian eating plans may provide significant health benefits including lowering rates of heart disease, obesity, diabetes, cancer, and high blood pressure. A vegetarian diet is usually low in cholesterol and saturated fat due to the high amount of grains, fruits, and vegetables and the elimination of meats. In addition, a vegetarian diet may have some advantages for people with diabetes. These advantages include:  Helping to maintain normal blood glucose levels.  Promoting a healthy weight.  Improving blood glucose control and insulin response.  Reducing the risk for cardiovascular disease. TYPES OF VEGETARIAN DIETS Vegans. These are vegetarians who consume only plant food. No animal foods of any kind are eaten. Anyone following such a diet will need to select fortified foods or take a vitamin B12 supplement. Vegans also may need a vitamin D supplement if sun exposure is limited. Some foods are fortified with vitamin D that can be chosen as well.  Lacto-vegetarians. These are vegetarians who eat dairy products (milk, cheese, and yogurt) in addition to plant foods. They do not eat meat, poultry, fish, or eggs. Lacto-ovo vegetarians. These are vegetarians who eat plant foods, eggs, and dairy products, but do not eat meat, fish, or poultry. LIMITING PROTEINS Meals should be planned to provide adequate sources of proteins as they may be limited in an unbalanced vegetarian diet. Good sources of proteins include beans or legumes, soy products, nuts, seeds, eggs, milk, and cheese. The list below includes food sources of protein. Your Registered Dietitian can help you determine your individual protein needs.  Beans and Peas (Dried and Cooked) Each serving represents 7 to 8 grams of  protein.  Black beans, 1 cup.  Black-eyed peas, 1 cup.  [place], 1 cup.  Garbanzo, chickpeas,  cup.  Kidney beans, 1 cup.  Lentils, 1 cup.  Lima beans, 1 cup.  Mung beans, 2 cups.  Navy beans,  cup.  Pinto beans,  cup.  Split peas,  cup.  Split pea soup, 1 cup. Meat Substitutes Each serving represents 7 to 8 grams of protein.  Bacon substitute, 2 tbs.  Hummus,  cup.  Soybeans, cup.  Textured vegetable protein,  oz.  Tofu (2 inch x 2 inch x 1 inch cube),  cup (4 oz). Nuts and Seeds Each serving represents 7 to 8 grams of protein.  Almonds, dry-roasted,  cup (1 oz).  Bolivia nuts,  cup (1 oz).  Butternuts,  cup (1 oz).  Peanuts, roasted,  cup (1 oz).  Pecans,  cup (1 oz).  Pignolias, pine nuts, 2 tbs.  Pistachios, 60 nuts (1 oz).  Pumpkin or squash seeds,  cup.  Sunflower seeds (no hull),  cup.  Sunflower seeds (with hulls),  cup.  Walnuts,  cup (1 oz). Milk/Milk Substitutes Each serving represents 7 to 8 grams of protein.  Soy milk, fortified, 1 cup.  Goat milk, 1 cup.  Kefir, 1 cup. SAMPLE MEAL PLAN - 2000 CALORIES  Carbohydrate: 276 grams (55% of total calories).  Protein: 95 grams (19% of total calories).  Fat: 60 grams (26% of total calories). Breakfast  Skim milk, 1 cup (8g*).  Orange juice,  cup.  1 slice whole-wheat toast (3g).  Flaked corn cereal,  cup (3g).  Margarine, 2 tsp. Morning snack  1 apple or 6 whole-wheat crackers (3g). Noon meal  Skim milk, 1 cup (8g).  Cottage cheese,  cup (8g).  1 medium orange.  1 pita bread filled with  cup garbanzo spread, chopped tomatoes, onions, and lettuce (15g).  Tahini sauce, 1 tsp. Afternoon snack   banana.  4 light, crispy rye crackers (3g). Evening meal  Green salad with bean sprouts (4g).  Pineapple with juice,  cup.  Spaghetti,  cup (3g).  Lentil spaghetti sauce, 1 cup (3g).  [place] dressing, 1 tbs. Evening snack  Skim milk, 1 cup  (8g).  Peanuts,  cup (7g).  Popcorn (lightly salted, no butter), 3 cups (3g). * Grams of protein. Document Released: 03/13/2003 Document Revised: 06/02/2011 Document Reviewed: 08/29/2010 [facility] Patient Information 2014 [place], [place].

## 2013-07-05 LAB — LIPID PANEL
Chol/HDL Ratio: 6.6 ratio units — ABNORMAL HIGH (ref 0.0–4.4)
Cholesterol, Total: 184 mg/dL (ref 100–199)
HDL: 28 mg/dL — ABNORMAL LOW (ref >39)
Triglycerides: 504 mg/dL — ABNORMAL HIGH (ref 0–149)

## 2013-07-05 LAB — HEPATIC FUNCTION PANEL
ALT: 13 IU/L (ref 0–32)
AST: 13 IU/L (ref 0–40)
Albumin: 3.9 g/dL (ref 3.6–4.8)
Alkaline Phosphatase: 153 IU/L — ABNORMAL HIGH (ref 39–117)
Bilirubin, Direct: 0.14 mg/dL (ref 0.00–0.40)
Total Bilirubin: 0.5 mg/dL (ref 0.0–1.2)
Total Protein: 7.3 g/dL (ref 6.0–8.5)

## 2013-07-05 LAB — MICROALBUMIN, URINE: Microalbumin, Urine: 174.9 ug/mL — ABNORMAL HIGH (ref 0.0–17.0)

## 2013-07-05 LAB — TSH: TSH: 3.66 u[IU]/mL (ref 0.450–4.500)

## 2013-07-05 LAB — VITAMIN D 25 HYDROXY (VIT D DEFICIENCY, FRACTURES): Vit D, 25-Hydroxy: 19.9 ng/mL — ABNORMAL LOW (ref 30.0–100.0)

## 2013-07-06 ENCOUNTER — Ambulatory Visit: Payer: Medicare HMO

## 2013-07-25 ENCOUNTER — Other Ambulatory Visit: Payer: Self-pay | Admitting: Family Medicine

## 2013-07-26 NOTE — Telephone Encounter (Signed)
Seen in office on 07-04-13. Also had labs done that day. Vit D and other labs need adjustment per lab note. Recommended appt with pharmacist but patient has not seen them yet. Do you want to continue with VitD or wait for adjustment? Please advise

## 2013-07-26 NOTE — Telephone Encounter (Signed)
Call patient : Prescription refilled & sent to pharmacy in [place]. 

## 2013-07-27 ENCOUNTER — Encounter (HOSPITAL_COMMUNITY): Payer: Self-pay | Admitting: Emergency Medicine

## 2013-07-27 ENCOUNTER — Telehealth: Payer: Self-pay

## 2013-07-27 ENCOUNTER — Emergency Department (HOSPITAL_COMMUNITY)
Admission: EM | Admit: 2013-07-27 | Discharge: 2013-07-27 | Disposition: A | Discharge status: Home / Self Care | Payer: Medicare PPO | Attending: Emergency Medicine | Admitting: Emergency Medicine

## 2013-07-27 DIAGNOSIS — T82898A Other specified complication of vascular prosthetic devices, implants and grafts, initial encounter: Secondary | ICD-10-CM

## 2013-07-27 DIAGNOSIS — R739 Hyperglycemia, unspecified: Secondary | ICD-10-CM

## 2013-07-27 DIAGNOSIS — R200 Anesthesia of skin: Secondary | ICD-10-CM

## 2013-07-27 DIAGNOSIS — F3289 Other specified depressive episodes: Secondary | ICD-10-CM | POA: Insufficient documentation

## 2013-07-27 DIAGNOSIS — Z91040 Latex allergy status: Secondary | ICD-10-CM | POA: Insufficient documentation

## 2013-07-27 DIAGNOSIS — I129 Hypertensive chronic kidney disease with stage 1 through stage 4 chronic kidney disease, or unspecified chronic kidney disease: Secondary | ICD-10-CM | POA: Insufficient documentation

## 2013-07-27 DIAGNOSIS — E119 Type 2 diabetes mellitus without complications: Secondary | ICD-10-CM | POA: Diagnosis not present

## 2013-07-27 DIAGNOSIS — G2581 Restless legs syndrome: Secondary | ICD-10-CM | POA: Insufficient documentation

## 2013-07-27 DIAGNOSIS — I2510 Atherosclerotic heart disease of native coronary artery without angina pectoris: Secondary | ICD-10-CM | POA: Insufficient documentation

## 2013-07-27 DIAGNOSIS — K219 Gastro-esophageal reflux disease without esophagitis: Secondary | ICD-10-CM | POA: Insufficient documentation

## 2013-07-27 DIAGNOSIS — F329 Major depressive disorder, single episode, unspecified: Secondary | ICD-10-CM | POA: Insufficient documentation

## 2013-07-27 DIAGNOSIS — Z9889 Other specified postprocedural states: Secondary | ICD-10-CM | POA: Diagnosis not present

## 2013-07-27 DIAGNOSIS — Z794 Long term (current) use of insulin: Secondary | ICD-10-CM | POA: Diagnosis not present

## 2013-07-27 DIAGNOSIS — Z9089 Acquired absence of other organs: Secondary | ICD-10-CM | POA: Insufficient documentation

## 2013-07-27 DIAGNOSIS — R252 Cramp and spasm: Secondary | ICD-10-CM

## 2013-07-27 DIAGNOSIS — Z79899 Other long term (current) drug therapy: Secondary | ICD-10-CM | POA: Insufficient documentation

## 2013-07-27 DIAGNOSIS — I252 Old myocardial infarction: Secondary | ICD-10-CM | POA: Diagnosis not present

## 2013-07-27 DIAGNOSIS — Z862 Personal history of diseases of the blood and blood-forming organs and certain disorders involving the immune mechanism: Secondary | ICD-10-CM | POA: Diagnosis not present

## 2013-07-27 DIAGNOSIS — E039 Hypothyroidism, unspecified: Secondary | ICD-10-CM | POA: Insufficient documentation

## 2013-07-27 DIAGNOSIS — N183 Chronic kidney disease, stage 3 unspecified: Secondary | ICD-10-CM | POA: Insufficient documentation

## 2013-07-27 DIAGNOSIS — N390 Urinary tract infection, site not specified: Secondary | ICD-10-CM | POA: Diagnosis not present

## 2013-07-27 DIAGNOSIS — E669 Obesity, unspecified: Secondary | ICD-10-CM | POA: Diagnosis not present

## 2013-07-27 DIAGNOSIS — Z85828 Personal history of other malignant neoplasm of skin: Secondary | ICD-10-CM | POA: Diagnosis not present

## 2013-07-27 DIAGNOSIS — M109 Gout, unspecified: Secondary | ICD-10-CM | POA: Diagnosis not present

## 2013-07-27 DIAGNOSIS — E785 Hyperlipidemia, unspecified: Secondary | ICD-10-CM | POA: Insufficient documentation

## 2013-07-27 LAB — CBC WITH DIFFERENTIAL/PLATELET
BASOS ABS: 0 10*3/uL (ref 0.0–0.1)
BASOS PCT: 0 % (ref 0–1)
Eosinophils Absolute: 0.3 10*3/uL (ref 0.0–0.7)
Eosinophils Relative: 4 % (ref 0–5)
HCT: 39.5 % (ref 36.0–46.0)
Hemoglobin: 13.4 g/dL (ref 12.0–15.0)
Lymphocytes Relative: 23 % (ref 12–46)
Lymphs Abs: 1.9 10*3/uL (ref 0.7–4.0)
MCH: 28.8 pg (ref 26.0–34.0)
MCHC: 33.9 g/dL (ref 30.0–36.0)
MCV: 84.8 fL (ref 78.0–100.0)
MONO ABS: 0.6 10*3/uL (ref 0.1–1.0)
Monocytes Relative: 7 % (ref 3–12)
NEUTROS ABS: 5.5 10*3/uL (ref 1.7–7.7)
Neutrophils Relative %: 66 % (ref 43–77)
Platelets: 311 10*3/uL (ref 150–400)
RBC: 4.66 MIL/uL (ref 3.87–5.11)
RDW: 13.4 % (ref 11.5–15.5)
WBC: 8.4 10*3/uL (ref 4.0–10.5)

## 2013-07-27 LAB — URINE MICROSCOPIC-ADD ON

## 2013-07-27 LAB — BASIC METABOLIC PANEL
BUN: 23 mg/dL (ref 6–23)
CALCIUM: 8.8 mg/dL (ref 8.4–10.5)
CO2: 24 mEq/L (ref 19–32)
Chloride: 90 mEq/L — ABNORMAL LOW (ref 96–112)
Creatinine, Ser: 1.62 mg/dL — ABNORMAL HIGH (ref 0.50–1.10)
GFR, EST AFRICAN AMERICAN: 38 mL/min — AB (ref >90)
GFR, EST NON AFRICAN AMERICAN: 33 mL/min — AB (ref >90)
GLUCOSE: 653 mg/dL — AB (ref 70–99)
POTASSIUM: 3.2 meq/L — AB (ref 3.7–5.3)
Sodium: 131 mEq/L — ABNORMAL LOW (ref 137–147)

## 2013-07-27 LAB — URINALYSIS, ROUTINE W REFLEX MICROSCOPIC
BILIRUBIN URINE: NEGATIVE
Glucose, UA: >1000 mg/dL — AB
KETONES UR: NEGATIVE mg/dL
Leukocytes, UA: NEGATIVE
Nitrite: NEGATIVE
PROTEIN: NEGATIVE mg/dL
Specific Gravity, Urine: 1.01 (ref 1.005–1.030)
UROBILINOGEN UA: 0.2 mg/dL (ref 0.0–1.0)
pH: 5.5 (ref 5.0–8.0)

## 2013-07-27 LAB — CBG MONITORING, ED
GLUCOSE-CAPILLARY: 258 mg/dL — AB (ref 70–99)
GLUCOSE-CAPILLARY: 216 mg/dL — AB (ref 70–99)
Glucose-Capillary: >600 mg/dL (ref 70–99)

## 2013-07-27 MED ORDER — CEPHALEXIN 500 MG PO CAPS: 500.0000 mg | ORAL_CAPSULE | Freq: Four times a day (QID) | ORAL | Status: DC

## 2013-07-27 MED ORDER — ACETAMINOPHEN 500 MG PO TABS
1000.0000 mg | ORAL_TABLET | Freq: Once | ORAL | Status: AC
  Administered 2013-07-27: 1000 mg via ORAL
  Filled 2013-07-27: qty 2

## 2013-07-27 MED ORDER — SODIUM CHLORIDE 0.9 % IV BOLUS (SEPSIS)
1000.0000 mL | Freq: Once | INTRAVENOUS | Status: AC
  Administered 2013-07-27: 1000 mL via INTRAVENOUS

## 2013-07-27 MED ORDER — DEXTROSE 5 % IV SOLN
1.0000 g | Freq: Once | INTRAVENOUS | Status: AC
  Administered 2013-07-27: 1 g via INTRAVENOUS
  Filled 2013-07-27: qty 10

## 2013-07-27 MED ORDER — SODIUM CHLORIDE 0.9 % IV BOLUS (SEPSIS)
2000.0000 mL | Freq: Once | INTRAVENOUS | Status: AC
  Administered 2013-07-27: 2000 mL via INTRAVENOUS

## 2013-07-27 NOTE — ED Provider Notes (Signed)
CSN: [id number]     Arrival date & time 07/27/13  1755 History   First MD Initiated Contact with Patient 07/27/13 [place]     Chief Complaint  Patient presents with  . Hyperglycemia     (Consider location/radiation/quality/duration/timing/severity/associated sxs/prior Treatment) Patient is a 63 y.o. female presenting with hyperglycemia. The history is provided by the patient.  Hyperglycemia  Patient states that her sugar is over 600, and she "can't get it down." She states that it has been like this for several months. She saw her PCP 2 weeks ago, but no medicine changes were undertaken at that time. She states that at 4 PM today, she took 50 units of NovoLog, and 45 units of Lantus insulin. She denies fever, chills, nausea, vomiting, weakness, or dizziness. She has dysuria without urinary frequency. There are no other known modifying factors   Past Medical History  Diagnosis Date  . Chest pain 2003    ?h/o MI, syncope; 2003 stress nuclear-inferoseptal scar, EF-54%; 2007-MI ruled out, echocardiogram-normal LV size, wall thickness and function  . RLS (restless legs syndrome)     sleep study in 2010: No apnea; moderate periodic movements  . GERD (gastroesophageal reflux disease)     H/o upper GI bleed  . GI bleeding   . Obese   . Gout   . Hypothyroidism   . Low back pain   . Cellulitis     Neck  . Diabetes mellitus 1998    A1c-7.1 in 2010, 6.7 in 2013  . Hyperlipidemia     Fasting lipids 2013:158, 152, 35, 93  . Hypertension 2002  . Anemia   . Hyperkalemia   . Irregular heart beat   . PONV (postoperative nausea and vomiting)   . Myocardial infarction   . Anginal pain     last cp few months ago  . CAD (coronary artery disease)     dr. Lattie Haw (Linna Hoff)  . Depression     Insomnia  . Chronic kidney disease, stage III (moderate)     Creat-1.9 in 04/2008, 2.65 in 02/2010(noHD  . Headache(784.0)   . Vitiligo   . H/O hiatal hernia   . Arthritis   . Cancer     HX SKIN  CANCER  . Renal insufficiency    Past Surgical History  Procedure Laterality Date  . Appendectomy    . Carpal tunnel release  1992, 1993, 2011    Right in 2011; previously bilateral  . Tubal ligation  1976  . Partial hysterectomy  1985    Vaginal  . Shoulder surgery  1997    Left  . Incise and drain abcess  2010    Neck-MRSA  . Colonoscopy  02/2002, 07/2009    few diverticula, otherwise negative  . Foot surgery  1984    Bilateral heel spurs  . Abdominal hysterectomy    . Shoulder surgery      RIGHT  ADJUSTED   . Av fistula placement Left 07/16/2012    Procedure: ARTERIOVENOUS (AV) FISTULA CREATION;  Surgeon: Mal Misty, MD;  Location: [facility];  Service: Vascular;  Laterality: Left;  . Av fistula placement Left 07/29/2012    Procedure: ARTERIOVENOUS (AV) FISTULA CREATION;  Surgeon: Mal Misty, MD;  Location: [facility];  Service: Vascular;  Laterality: Left;  Left Brachial-Cephalic Arteriovenous Fistula Creation  . Colonoscopy with esophagogastroduodenoscopy (egd) N/A 08/27/2012    RMR: Normal esophagus s/p 54 F dilator. Small hiatal hernia. Gastric polyp removed, oozing (hyperplastic), which could explain heme positive  stool:Colonic diverticulosis   Family History  Problem Relation Age of Onset  . Emphysema Mother   . COPD Mother   . Colon cancer Father   . Diabetes Father   . Cancer Father     colon  . Hypertension Father   . Congestive Heart Failure Maternal Grandmother   . Cancer Paternal Grandmother    History  Substance Use Topics  . Smoking status: Never Smoker   . Smokeless tobacco: Never Used  . Alcohol Use: No   OB History   Grav Para Term Preterm Abortions TAB SAB Ect Mult Living                 Review of Systems  All other systems reviewed and are negative.     Allergies  Latex; Vancomycin hcl; and Quinapril hcl  Home Medications   Prior to Admission medications   Medication Sig Start Date End Date Taking? Authorizing Provider  allopurinol  (ZYLOPRIM) 100 MG tablet Take 1 tablet (100 mg total) by mouth every morning. 07/04/13  Yes Vernie Shanks, MD  amLODipine (NORVASC) 5 MG tablet Take 1 tablet (5 mg total) by mouth daily. 07/04/13  Yes Vernie Shanks, MD  carvedilol (COREG) 25 MG tablet Take 1 tablet (25 mg total) by mouth 2 (two) times daily with a meal. 07/04/13  Yes Vernie Shanks, MD  citalopram (CELEXA) 40 MG tablet TAKE ONE TABLET BY MOUTH ONCe daily 07/04/13  Yes Vernie Shanks, MD  ferrous sulfate 325 (65 FE) MG tablet Take 1 tablet (325 mg total) by mouth daily with breakfast. 04/11/13  Yes Orvil Feil, NP  furosemide (LASIX) 40 MG tablet Take 1 tablet (40 mg total) by mouth 2 (two) times daily. 07/04/13  Yes Vernie Shanks, MD  gabapentin (NEURONTIN) 800 MG tablet TAKE THREE TABLETS BY MOUTH ONCE DAILY AT BEDTIME 07/04/13  Yes Vernie Shanks, MD  levothyroxine (SYNTHROID, LEVOTHROID) 50 MCG tablet Take 1 tablet (50 mcg total) by mouth every evening. 07/04/13  Yes Vernie Shanks, MD  losartan (COZAAR) 50 MG tablet  06/13/13  Yes Historical Provider, MD  omeprazole (PRILOSEC) 20 MG capsule TAKE ONE CAPSULE BY MOUTH ONCE DAILY IN THE MORNING 07/04/13  Yes Vernie Shanks, MD  potassium chloride (KLOR-CON M10) 10 MEQ tablet TAKE ONE TABLET BY MOUTH TWICE DAILY 07/04/13  Yes Vernie Shanks, MD  pravastatin (PRAVACHOL) 40 MG tablet TAKE ONE TABLET BY MOUTH ONCE DAILY AT BEDTIME 07/04/13  Yes Vernie Shanks, MD  rOPINIRole (REQUIP) 1 MG tablet Take 1 tablet (1 mg total) by mouth at bedtime. 07/04/13  Yes Vernie Shanks, MD  sodium bicarbonate 650 MG tablet Take 650 mg by mouth 2 (two) times daily.   Yes Historical Provider, MD  tiotropium (SPIRIVA HANDIHALER) 18 MCG inhalation capsule Place 1 capsule (18 mcg total) into inhaler and inhale every morning. 07/04/13  Yes Vernie Shanks, MD  Vitamin D, Ergocalciferol, (DRISDOL) 50000 UNITS CAPS capsule Take 50,000 Units by mouth every 7 (seven) days. Monday   Yes Historical Provider, MD  albuterol  (PROAIR HFA) 108 (90 BASE) MCG/ACT inhaler Inhale 2 puffs into the lungs every 6 (six) hours as needed for wheezing or shortness of breath. 07/04/13   Vernie Shanks, MD  ciprofloxacin (CIPRO) 500 MG tablet Take 1 tablet (500 mg total) by mouth 2 (two) times daily. 04/27/13   Erby Pian, FNP  clobetasol (TEMOVATE) 0.05 % external solution Apply 1 application topically daily.  Applied to scalp for psoriasis 12/20/12   Historical Provider, MD  fluconazole (DIFLUCAN) 150 MG tablet Take 1 tablet (150 mg total) by mouth once. May repeat in 3 days 04/27/13   Erby Pian, FNP  insulin aspart (NOVOLOG FLEXPEN) 100 UNIT/ML injection Inject 40 Units into the skin 3 (three) times daily before meals. 07/04/13   Vernie Shanks, MD  Insulin Glargine (LANTUS SOLOSTAR) 100 UNIT/ML Solostar Pen 45 units in the morning and 45 units at night 07/04/13   Vernie Shanks, MD  nitroGLYCERIN (NITROSTAT) 0.4 MG SL tablet Place 1 tablet (0.4 mg total) under the tongue every 5 (five) minutes as needed for chest pain. 05/24/12   Yehuda Savannah, MD  Vitamin D, Ergocalciferol, (DRISDOL) 50000 UNITS CAPS capsule TAKE ONE CAPSULE BY MOUTH ONCE A WEEK ON  MONDAY    Vernie Shanks, MD   BP 138/59  Pulse 68  Temp(Src) 98.2 F (36.8 C) (Oral)  Resp 20  Ht 5\' 7"  (1.702 m)  Wt 224 lb (101.606 kg)  BMI 35.08 kg/m2  SpO2 95% Physical Exam  Nursing note and vitals reviewed. Constitutional: She is oriented to person, place, and time. She appears well-developed and well-nourished.  She is overweight  HENT:  Head: Normocephalic and atraumatic.  Mouth/Throat: Oropharynx is clear and moist.  Eyes: Conjunctivae and EOM are normal. Pupils are equal, round, and reactive to light.  Neck: Normal range of motion and phonation normal. Neck supple.  Cardiovascular: Normal rate, regular rhythm and intact distal pulses.   Pulmonary/Chest: Effort normal and breath sounds normal. She exhibits no tenderness.  Abdominal: Soft. She exhibits no  distension. There is no tenderness. There is no guarding.  Musculoskeletal: Normal range of motion.  Neurological: She is alert and oriented to person, place, and time. She exhibits normal muscle tone.  Skin: Skin is warm and dry.  Psychiatric: She has a normal mood and affect. Her behavior is normal. Judgment and thought content normal.    ED Course  Procedures (including critical care time)  Medications  sodium chloride 0.9 % bolus 1,000 mL (0 mLs Intravenous Stopped 07/27/13 1909)  sodium chloride 0.9 % bolus 2,000 mL (0 mLs Intravenous Stopped 07/27/13 2020)  cefTRIAXone (ROCEPHIN) 1 g in dextrose 5 % 50 mL IVPB (0 g Intravenous Stopped 07/27/13 1950)  acetaminophen (TYLENOL) tablet 1,000 mg (1,000 mg Oral Given 07/27/13 2052)    Patient Vitals for the past 24 hrs:  BP Temp Temp src Pulse Resp SpO2 Height Weight  07/27/13 2039 138/59 mmHg 98.2 F (36.8 C) Oral 68 20 95 % - -  07/27/13 1758 149/69 mmHg 98 F (36.7 C) Oral 74 18 94 % 5\' 7"  (1.702 m) 224 lb (101.606 kg)    9:39 PM Reevaluation with update and discussion. After initial assessment and treatment, an updated evaluation reveals she is comfortable. Repeat vital signs normal. Findings discussed with patient and all questions answered. Richarda Blade     Labs Review Labs Reviewed  BASIC METABOLIC PANEL - Abnormal; Notable for the following:    Sodium 131 (*)    Potassium 3.2 (*)    Chloride 90 (*)    Glucose, Bld 653 (*)    Creatinine, Ser 1.62 (*)    GFR calc non Af Amer 33 (*)    GFR calc Af Amer 38 (*)    All other components within normal limits  URINALYSIS, ROUTINE W REFLEX MICROSCOPIC - Abnormal; Notable for the following:    Glucose, UA >  1000 (*)    Hgb urine dipstick TRACE (*)    All other components within normal limits  URINE MICROSCOPIC-ADD ON - Abnormal; Notable for the following:    Bacteria, UA FEW (*)    All other components within normal limits  CBG MONITORING, ED - Abnormal; Notable for the  following:    Glucose-Capillary >600 (*)    All other components within normal limits  CBG MONITORING, ED - Abnormal; Notable for the following:    Glucose-Capillary 258 (*)    All other components within normal limits  CBC WITH DIFFERENTIAL  CBG MONITORING, ED  CBG MONITORING, ED    Imaging Review No results found.   EKG Interpretation None      MDM   Final diagnoses:  UTI (urinary tract infection)  Hyperglycemia    Hyperglycemia with urinary tract infection. No Metabolic instability. Anion Gap 16.  Nursing Notes Reviewed/ Care Coordinated Applicable Imaging Reviewed Interpretation of Laboratory Data incorporated into ED treatment  The patient appears reasonably screened and/or stabilized for discharge and I doubt any other medical condition or other [facility] requiring further screening, evaluation, or treatment in the ED at this time prior to discharge.  Plan: Home Medications- Keflex; Home Treatments- rest, increase fluids; return here if the recommended treatment, does not improve the symptoms; Recommended follow up- PCP 1 week.    Richarda Blade, MD 07/27/13 2141

## 2013-07-27 NOTE — ED Notes (Signed)
CRITICAL VALUE ALERT  Critical value received:  Glucose  Date of notification:  07/27/13  Time of notification: 1852  Critical value read back:yes  Nurse who received alert:  Fredna Dow RN  MD notified (1st page): Dr. Eulis Foster  Time of first page:  1852  MD notified (2nd page):  Time of second page:  Responding MD: Dr Eulis Foster  Time MD responded:  [phone]

## 2013-07-27 NOTE — Discharge Instructions (Signed)
Hyperglycemia °Hyperglycemia occurs when the glucose (sugar) in your blood is too high. Hyperglycemia can happen for many reasons, but it most often happens to people who do not know they have diabetes or are not managing their diabetes properly.  °CAUSES  °Whether you have diabetes or not, there are other causes of hyperglycemia. Hyperglycemia can occur when you have diabetes, but it can also occur in other situations that you might not be as aware of, such as: °Diabetes °· If you have diabetes and are having problems controlling your blood glucose, hyperglycemia could occur because of some of the following reasons: °· Not following your meal plan. °· Not taking your diabetes medications or not taking it properly. °· Exercising less or doing less activity than you normally do. °· Being sick. °Pre-diabetes °· This cannot be ignored. Before people develop Type 2 diabetes, they almost always have "pre-diabetes." This is when your blood glucose levels are higher than normal, but not yet high enough to be diagnosed as diabetes. Research has shown that some long-term damage to the body, especially the heart and circulatory system, may already be occurring during pre-diabetes. If you take action to manage your blood glucose when you have pre-diabetes, you may delay or prevent Type 2 diabetes from developing. °Stress °· If you have diabetes, you may be "diet" controlled or on oral medications or insulin to control your diabetes. However, you may find that your blood glucose is higher than usual in the hospital whether you have diabetes or not. This is often referred to as "stress hyperglycemia." Stress can elevate your blood glucose. This happens because of hormones put out by the body during times of stress. If stress has been the cause of your high blood glucose, it can be followed regularly by your caregiver. That way he/she can make sure your hyperglycemia does not continue to get worse or progress to  diabetes. °Steroids °· Steroids are medications that act on the infection fighting system (immune system) to block inflammation or infection. One side effect can be a rise in blood glucose. Most people can produce enough extra insulin to allow for this rise, but for those who cannot, steroids make blood glucose levels go even higher. It is not unusual for steroid treatments to "uncover" diabetes that is developing. It is not always possible to determine if the hyperglycemia will go away after the steroids are stopped. A special blood test called an A1c is sometimes done to determine if your blood glucose was elevated before the steroids were started. °SYMPTOMS °· Thirsty. °· Frequent urination. °· Dry mouth. °· Blurred vision. °· Tired or fatigue. °· Weakness. °· Sleepy. °· Tingling in feet or leg. °DIAGNOSIS  °Diagnosis is made by monitoring blood glucose in one or all of the following ways: °· A1c test. This is a chemical found in your blood. °· Fingerstick blood glucose monitoring. °· Laboratory results. °TREATMENT  °First, knowing the cause of the hyperglycemia is important before the hyperglycemia can be treated. Treatment may include, but is not be limited to: °· Education. °· Change or adjustment in medications. °· Change or adjustment in meal plan. °· Treatment for an illness, infection, etc. °· More frequent blood glucose monitoring. °· Change in exercise plan. °· Decreasing or stopping steroids. °· Lifestyle changes. °HOME CARE INSTRUCTIONS  °· Test your blood glucose as directed. °· Exercise regularly. Your caregiver will give you instructions about exercise. Pre-diabetes or diabetes which comes on with stress is helped by exercising. °· Eat wholesome,   balanced meals. Eat often and at regular, fixed times. Your caregiver or nutritionist will give you a meal plan to guide your sugar intake.  Being at an ideal weight is important. If needed, losing as little as 10 to 15 pounds may help improve blood  glucose levels. SEEK MEDICAL CARE IF:   You have questions about medicine, activity, or diet.  You continue to have symptoms (problems such as increased thirst, urination, or weight gain). SEEK IMMEDIATE MEDICAL CARE IF:   You are vomiting or have diarrhea.  Your breath smells fruity.  You are breathing faster or slower.  You are very sleepy or incoherent.  You have numbness, tingling, or pain in your feet or hands.  You have chest pain.  Your symptoms get worse even though you have been following your caregiver's orders.  If you have any other questions or concerns. Document Released: 09/03/2000 Document Revised: 06/02/2011 Document Reviewed: 07/07/2011 [facility] Patient Information 2014 [place], [place].  Urinary Tract Infection Urinary tract infections (UTIs) can develop anywhere along your urinary tract. Your urinary tract is your body's drainage system for removing wastes and extra water. Your urinary tract includes two kidneys, two ureters, a bladder, and a urethra. Your kidneys are a pair of bean-shaped organs. Each kidney is about the size of your fist. They are located below your ribs, one on each side of your spine. CAUSES Infections are caused by microbes, which are microscopic organisms, including fungi, viruses, and bacteria. These organisms are so small that they can only be seen through a microscope. Bacteria are the microbes that most commonly cause UTIs. SYMPTOMS  Symptoms of UTIs may vary by age and gender of the patient and by the location of the infection. Symptoms in young women typically include a frequent and intense urge to urinate and a painful, burning feeling in the bladder or urethra during urination. Older women and men are more likely to be tired, shaky, and weak and have muscle aches and abdominal pain. A fever may mean the infection is in your kidneys. Other symptoms of a kidney infection include pain in your back or sides below the ribs, nausea, and  vomiting. DIAGNOSIS To diagnose a UTI, your caregiver will ask you about your symptoms. Your caregiver also will ask to provide a urine sample. The urine sample will be tested for bacteria and white blood cells. White blood cells are made by your body to help fight infection. TREATMENT  Typically, UTIs can be treated with medication. Because most UTIs are caused by a bacterial infection, they usually can be treated with the use of antibiotics. The choice of antibiotic and length of treatment depend on your symptoms and the type of bacteria causing your infection. HOME CARE INSTRUCTIONS  If you were prescribed antibiotics, take them exactly as your caregiver instructs you. Finish the medication even if you feel better after you have only taken some of the medication.  Drink enough water and fluids to keep your urine clear or pale yellow.  Avoid caffeine, tea, and carbonated beverages. They tend to irritate your bladder.  Empty your bladder often. Avoid holding urine for long periods of time.  Empty your bladder before and after sexual intercourse.  After a bowel movement, women should cleanse from front to back. Use each tissue only once. SEEK MEDICAL CARE IF:   You have back pain.  You develop a fever.  Your symptoms do not begin to resolve within 3 days. SEEK IMMEDIATE MEDICAL CARE IF:   You  have severe back pain or lower abdominal pain.  You develop chills.  You have nausea or vomiting.  You have continued burning or discomfort with urination. MAKE SURE YOU:   Understand these instructions.  Will watch your condition.  Will get help right away if you are not doing well or get worse. Document Released: 12/18/2004 Document Revised: 09/09/2011 Document Reviewed: 04/18/2011 [facility] Patient Information 2014 [place].

## 2013-07-27 NOTE — Telephone Encounter (Signed)
Phone call from pt.  Reported a 3 mo. History of cramping in left hand and fingers, intermittently.  Stated the cramping comes and goes, but has gotten progressively worse.  Reported she notices there is a tendency for the fingers to curl up when doing activities such as peeling potatoes, or gripping her steering wheel.   Reported her fingertips get numb and ice cold at times.  Reported noticing the base of nailbeds (L) hand get dusky at times.  Also, reported she can feel veins in the left arm, above the fistula, feel like they are bulging.  Has not started dialysis. Discussed pt's symptoms with Dr. Scot Dock.  Recommends a Left Upper Extremity Arterial Duplex to r/o steal.  Will notify pt. of appt. for vascular lab and MD appt.

## 2013-07-27 NOTE — ED Notes (Signed)
Pt states "my sugar is over 600 and I can't get it down". Pt c/o nausea and states she is disoriented. Pt took 50 units of novolog at 1600 today. Pt also had 45 units lantus today.

## 2013-07-29 ENCOUNTER — Encounter: Payer: Self-pay | Admitting: Vascular Surgery

## 2013-07-29 ENCOUNTER — Ambulatory Visit (HOSPITAL_COMMUNITY)
Admission: RE | Admit: 2013-07-29 | Discharge: 2013-07-29 | Disposition: A | Discharge status: Home / Self Care | Payer: Medicare HMO | Source: Ambulatory Visit | Attending: Vascular Surgery | Admitting: Vascular Surgery

## 2013-07-29 ENCOUNTER — Ambulatory Visit (INDEPENDENT_AMBULATORY_CARE_PROVIDER_SITE_OTHER): Payer: Medicare PPO | Admitting: Vascular Surgery

## 2013-07-29 ENCOUNTER — Encounter: Payer: Self-pay | Admitting: *Deleted

## 2013-07-29 ENCOUNTER — Other Ambulatory Visit: Payer: Self-pay | Admitting: *Deleted

## 2013-07-29 VITALS — BP 127/98 | HR 72 | Ht 67.0 in | Wt 226.0 lb

## 2013-07-29 DIAGNOSIS — R200 Anesthesia of skin: Secondary | ICD-10-CM | POA: Insufficient documentation

## 2013-07-29 DIAGNOSIS — T82898A Other specified complication of vascular prosthetic devices, implants and grafts, initial encounter: Secondary | ICD-10-CM | POA: Diagnosis not present

## 2013-07-29 DIAGNOSIS — R252 Cramp and spasm: Secondary | ICD-10-CM | POA: Insufficient documentation

## 2013-07-29 DIAGNOSIS — Y849 Medical procedure, unspecified as the cause of abnormal reaction of the patient, or of later complication, without mention of misadventure at the time of the procedure: Secondary | ICD-10-CM | POA: Diagnosis not present

## 2013-07-29 DIAGNOSIS — R209 Unspecified disturbances of skin sensation: Secondary | ICD-10-CM | POA: Diagnosis not present

## 2013-07-29 DIAGNOSIS — N186 End stage renal disease: Secondary | ICD-10-CM

## 2013-07-29 NOTE — Progress Notes (Signed)
Established Dialysis Access  History of Present Illness  [name] is a 63 y.o. ([birth year]) female who presents for re-evaluation of left brachiocephalic arteriovenous fistula. The patient is right hand dominant.  Previous access procedures have been completed in the left arm.  The patient's complication from previous access procedures include: steal syndrome.  The patient has never had a previous PPM placed.  She complaints of intermittent severe cramping, numbness, and coldness in her left hand  Past Medical History  Diagnosis Date  . Chest pain 2003    ?h/o MI, syncope; 2003 stress nuclear-inferoseptal scar, EF-54%; 2007-MI ruled out, echocardiogram-normal LV size, wall thickness and function  . RLS (restless legs syndrome)     sleep study in 2010: No apnea; moderate periodic movements  . GERD (gastroesophageal reflux disease)     H/o upper GI bleed  . GI bleeding   . Obese   . Gout   . Hypothyroidism   . Low back pain   . Cellulitis     Neck  . Diabetes mellitus 1998    A1c-7.1 in 2010, 6.7 in 2013  . Hyperlipidemia     Fasting lipids 2013:158, 152, 35, 93  . Hypertension 2002  . Anemia   . Hyperkalemia   . Irregular heart beat   . PONV (postoperative nausea and vomiting)   . Myocardial infarction   . Anginal pain     last cp few months ago  . CAD (coronary artery disease)     dr. Lattie Haw ([facility])  . Depression     Insomnia  . Chronic kidney disease, stage III (moderate)     Creat-1.9 in 04/2008, 2.65 in 02/2010(noHD  . Headache(784.0)   . Vitiligo   . H/O hiatal hernia   . Arthritis   . Cancer     HX SKIN CANCER  . Renal insufficiency     Past Surgical History  Procedure Laterality Date  . Appendectomy    . Carpal tunnel release  1992, 1993, 2011    Right in 2011; previously bilateral  . Tubal ligation  1976  . Partial hysterectomy  1985    Vaginal  . Shoulder surgery  1997    Left  . Incise and drain abcess  2010    Neck-MRSA  .  Colonoscopy  02/2002, 07/2009    few diverticula, otherwise negative  . Foot surgery  1984    Bilateral heel spurs  . Abdominal hysterectomy    . Shoulder surgery      RIGHT  ADJUSTED   . Av fistula placement Left 07/16/2012    Procedure: ARTERIOVENOUS (AV) FISTULA CREATION;  Surgeon: Mal Misty, MD;  Location: [facility];  Service: Vascular;  Laterality: Left;  . Av fistula placement Left 07/29/2012    Procedure: ARTERIOVENOUS (AV) FISTULA CREATION;  Surgeon: Mal Misty, MD;  Location: [facility];  Service: Vascular;  Laterality: Left;  Left Brachial-Cephalic Arteriovenous Fistula Creation  . Colonoscopy with esophagogastroduodenoscopy (egd) N/A 08/27/2012    RMR: Normal esophagus s/p 54 F dilator. Small hiatal hernia. Gastric polyp removed, oozing (hyperplastic), which could explain heme positive stool:Colonic diverticulosis    History   Social History  . Marital Status: Divorced    Spouse Name: N/A    Number of Children: 3  . Years of Education: N/A   Occupational History  . Disabled     previously Nurse, adult   Social History Main Topics  . Smoking status: Never Smoker   . Smokeless tobacco: Never  Used  . Alcohol Use: No  . Drug Use: No  . Sexual Activity: No   Other Topics Concern  . Not on file   Social History Narrative   No regular exercise    Family History  Problem Relation Age of Onset  . Emphysema Mother   . COPD Mother   . Colon cancer Father   . Diabetes Father   . Cancer Father     colon  . Hypertension Father   . Congestive Heart Failure Maternal Grandmother   . Cancer Paternal Grandmother     Current Outpatient Prescriptions on File Prior to Visit  Medication Sig Dispense Refill  . albuterol (PROAIR HFA) 108 (90 BASE) MCG/ACT inhaler Inhale 2 puffs into the lungs every 6 (six) hours as needed for wheezing or shortness of breath.  1 Inhaler  1  . allopurinol (ZYLOPRIM) 100 MG tablet Take 1 tablet (100 mg total) by mouth every morning.  30 tablet  11  .  amLODipine (NORVASC) 5 MG tablet Take 1 tablet (5 mg total) by mouth daily.  30 tablet  5  . carvedilol (COREG) 25 MG tablet Take 1 tablet (25 mg total) by mouth 2 (two) times daily with a meal.  60 tablet  6  . cephALEXin (KEFLEX) 500 MG capsule Take 1 capsule (500 mg total) by mouth 4 (four) times daily.  28 capsule  0  . citalopram (CELEXA) 40 MG tablet Take 40 mg by mouth daily.      . clobetasol (TEMOVATE) 0.05 % external solution Apply 1 application topically daily. Applied to scalp for psoriasis      . ferrous sulfate 325 (65 FE) MG tablet Take 1 tablet (325 mg total) by mouth daily with breakfast.  30 tablet  5  . furosemide (LASIX) 40 MG tablet Take 1 tablet (40 mg total) by mouth 2 (two) times daily.  60 tablet  5  . gabapentin (NEURONTIN) 800 MG tablet Take 2,400 mg by mouth at bedtime.      . insulin aspart (NOVOLOG FLEXPEN) 100 UNIT/ML FlexPen Inject 45 Units into the skin 3 (three) times daily with meals.      . Insulin Glargine (LANTUS SOLOSTAR) 100 UNIT/ML Solostar Pen Inject 45 Units into the skin 2 (two) times daily. 45 units in the morning and 45 units at night  30 mL  2  . levothyroxine (SYNTHROID, LEVOTHROID) 50 MCG tablet Take 1 tablet (50 mcg total) by mouth every evening.  30 tablet  2  . losartan (COZAAR) 50 MG tablet       . nitroGLYCERIN (NITROSTAT) 0.4 MG SL tablet Place 1 tablet (0.4 mg total) under the tongue every 5 (five) minutes as needed for chest pain.  50 tablet  0  . omeprazole (PRILOSEC) 20 MG capsule Take 20 mg by mouth daily.      . potassium chloride (K-DUR) 10 MEQ tablet Take 10 mEq by mouth 2 (two) times daily.      . pravastatin (PRAVACHOL) 40 MG tablet Take 40 mg by mouth at bedtime.      Marland Kitchen rOPINIRole (REQUIP) 1 MG tablet Take 1 tablet (1 mg total) by mouth at bedtime.  30 tablet  11  . sodium bicarbonate 650 MG tablet Take 650 mg by mouth 2 (two) times daily.      Marland Kitchen tiotropium (SPIRIVA HANDIHALER) 18 MCG inhalation capsule Place 1 capsule (18 mcg total)  into inhaler and inhale every morning.  30 capsule  5  . Vitamin  D, Ergocalciferol, (DRISDOL) 50000 UNITS CAPS capsule Take 50,000 Units by mouth every 7 (seven) days. Monday       No current facility-administered medications on file prior to visit.    Allergies  Allergen Reactions  . Latex Shortness Of Breath and Swelling    Facial swelling  . Vancomycin Hcl Swelling    REACTION: facial swelling  . Quinapril Hcl Swelling and Rash    Arm swelling    REVIEW OF SYSTEMS:  (Positives checked otherwise negative)  CARDIOVASCULAR:  []  chest pain, []  chest pressure, []  palpitations, []  shortness of breath when laying flat, []  shortness of breath with exertion,  []  pain in feet when walking, []  pain in feet when laying flat, []  history of blood clot in veins (DVT), []  history of phlebitis, [x]  swelling in legs, []  varicose veins  PULMONARY:  []  productive cough, []  asthma, []  wheezing  NEUROLOGIC:  []  weakness in arms or legs, [x]  numbness in arms or legs, []  difficulty speaking or slurred speech, []  temporary loss of vision in one eye, []  dizziness  HEMATOLOGIC:  []  bleeding problems, []  problems with blood clotting too easily  MUSCULOSKEL:  []  joint pain, []  joint swelling  GASTROINTEST:  []  vomiting blood, []  blood in stool     GENITOURINARY:  []  burning with urination, []  blood in urine  PSYCHIATRIC:  []  history of major depression  INTEGUMENTARY:  []  rashes, []  ulcers  Physical Examination  Filed Vitals:   07/29/13 1545  BP: 127/98  Pulse: 72  Height: 5\' 7"  (1.702 m)  Weight: 226 lb (102.513 kg)  SpO2: 98%   Body mass index is 35.39 kg/(m^2).  General: A&O x 3, WD, obese  Pulmonary: Sym exp, good air movt, CTAB, no rales, rhonchi, & wheezing  Cardiac: RRR, Nl S1, S2, no Murmurs, rubs or gallops Vascular: Vessel Right Left  Radial Faintly Palpable Not Palpable  Ulnar Not Palpable Not Palpable  Brachial Palpable Palpable   Gastrointestinal: soft, NTND, -G/R, -  HSM, - masses, - CVAT B  Musculoskeletal: M/S 5/5 throughout , Extremities without  ischemic changes , palpable thrill in access in L BC AVF, + bruit in access, left hand somewhat cyanotic, CR <4 sec  Neurologic: Pain and light touch intact in extremities , Motor exam as listed above  Non-Invasive Vascular Imaging  Left arm arterial Duplex  (Date: 07/29/2013):   SCA: mono  Axillary: mono  Brach: mono  Radial: Bi  Ulnar: Bi/tri  Medical Decision Making  DELEENA OCONNOR is a 63 y.o. female who presents with chronic kidney disease stage V complicated with likely steal syndrome  The patient likely has inflow compromise as evident by the high PSV in the arterial system of the L arm  I discussed with the patient the nature of angiographic procedures, especially the limited patencies of any endovascular intervention.  The patient is aware of that the risks of an angiographic procedure include but are not limited to: bleeding, infection, access site complications, renal failure, embolization, rupture of vessel, dissection, possible need for emergent surgical intervention, possible need for surgical procedures to treat the patient's pathology, anaphylactic reaction to contrast, and stroke and death.    The patient is aware of the risks and agrees to proceed.  Adele Barthel, MD [facility] Office: [phone] Pager: [phone]  07/29/2013, 4:07 PM

## 2013-08-01 ENCOUNTER — Other Ambulatory Visit: Payer: Self-pay | Admitting: *Deleted

## 2013-08-01 ENCOUNTER — Encounter (HOSPITAL_COMMUNITY)
Admission: RE | Disposition: A | Discharge status: Home / Self Care | Payer: Self-pay | Source: Ambulatory Visit | Attending: Vascular Surgery

## 2013-08-01 ENCOUNTER — Ambulatory Visit (HOSPITAL_COMMUNITY)
Admission: RE | Admit: 2013-08-01 | Discharge: 2013-08-01 | Disposition: A | Discharge status: Home / Self Care | Payer: Medicare PPO | Source: Ambulatory Visit | Attending: Vascular Surgery | Admitting: Vascular Surgery

## 2013-08-01 DIAGNOSIS — E039 Hypothyroidism, unspecified: Secondary | ICD-10-CM | POA: Insufficient documentation

## 2013-08-01 DIAGNOSIS — T82898A Other specified complication of vascular prosthetic devices, implants and grafts, initial encounter: Secondary | ICD-10-CM | POA: Insufficient documentation

## 2013-08-01 DIAGNOSIS — D649 Anemia, unspecified: Secondary | ICD-10-CM | POA: Diagnosis not present

## 2013-08-01 DIAGNOSIS — I2510 Atherosclerotic heart disease of native coronary artery without angina pectoris: Secondary | ICD-10-CM | POA: Diagnosis not present

## 2013-08-01 DIAGNOSIS — E785 Hyperlipidemia, unspecified: Secondary | ICD-10-CM | POA: Diagnosis not present

## 2013-08-01 DIAGNOSIS — N183 Chronic kidney disease, stage 3 unspecified: Secondary | ICD-10-CM | POA: Diagnosis not present

## 2013-08-01 DIAGNOSIS — Z85828 Personal history of other malignant neoplasm of skin: Secondary | ICD-10-CM | POA: Diagnosis not present

## 2013-08-01 DIAGNOSIS — I252 Old myocardial infarction: Secondary | ICD-10-CM | POA: Insufficient documentation

## 2013-08-01 DIAGNOSIS — M109 Gout, unspecified: Secondary | ICD-10-CM | POA: Diagnosis not present

## 2013-08-01 DIAGNOSIS — K219 Gastro-esophageal reflux disease without esophagitis: Secondary | ICD-10-CM | POA: Diagnosis not present

## 2013-08-01 DIAGNOSIS — I129 Hypertensive chronic kidney disease with stage 1 through stage 4 chronic kidney disease, or unspecified chronic kidney disease: Secondary | ICD-10-CM | POA: Diagnosis not present

## 2013-08-01 DIAGNOSIS — Y838 Other surgical procedures as the cause of abnormal reaction of the patient, or of later complication, without mention of misadventure at the time of the procedure: Secondary | ICD-10-CM | POA: Insufficient documentation

## 2013-08-01 HISTORY — PX: SHUNTOGRAM: SHX5491

## 2013-08-01 LAB — POCT I-STAT, CHEM 8
BUN: 27 mg/dL — ABNORMAL HIGH (ref 6–23)
CREATININE: 1.8 mg/dL — AB (ref 0.50–1.10)
Calcium, Ion: 1.2 mmol/L (ref 1.13–1.30)
Chloride: 103 mEq/L (ref 96–112)
GLUCOSE: 132 mg/dL — AB (ref 70–99)
HEMATOCRIT: 36 % (ref 36.0–46.0)
Hemoglobin: 12.2 g/dL (ref 12.0–15.0)
POTASSIUM: 3.1 meq/L — AB (ref 3.7–5.3)
Sodium: 142 mEq/L (ref 137–147)
TCO2: 24 mmol/L (ref 0–100)

## 2013-08-01 LAB — GLUCOSE, CAPILLARY: Glucose-Capillary: 74 mg/dL (ref 70–99)

## 2013-08-01 SURGERY — ASSESSMENT, SHUNT FUNCTION, WITH CONTRAST RADIOGRAPHIC STUDY
Anesthesia: LOCAL | Laterality: Left

## 2013-08-01 MED ORDER — SODIUM CHLORIDE 0.9 % IV SOLN: 250.0000 mL | INTRAVENOUS | Status: DC | PRN

## 2013-08-01 MED ORDER — SODIUM CHLORIDE 0.9 % IJ SOLN: 3.0000 mL | INTRAMUSCULAR | Status: DC | PRN

## 2013-08-01 MED ORDER — SODIUM CHLORIDE 0.9 % IV SOLN: 1.0000 mL/kg/h | INTRAVENOUS | Status: DC

## 2013-08-01 MED ORDER — SODIUM CHLORIDE 0.9 % IJ SOLN: 3.0000 mL | Freq: Two times a day (BID) | INTRAMUSCULAR | Status: DC

## 2013-08-01 MED ORDER — HEPARIN (PORCINE) IN NACL 2-0.9 UNIT/ML-% IJ SOLN
INTRAMUSCULAR | Status: AC
  Filled 2013-08-01: qty 500

## 2013-08-01 MED ORDER — LIDOCAINE HCL (PF) 1 % IJ SOLN
INTRAMUSCULAR | Status: AC
  Filled 2013-08-01: qty 30

## 2013-08-01 MED ORDER — ACETAMINOPHEN 325 MG PO TABS: 650.0000 mg | ORAL_TABLET | ORAL | Status: DC | PRN

## 2013-08-01 NOTE — Op Note (Signed)
    OPERATIVE NOTE   PROCEDURE: 1. Left brachiocephalic arteriovenous fistula cannulation under ultrasound guidance 2. Left arm fistulogram 3. Left arm arteriogram  PRE-OPERATIVE DIAGNOSIS: Possible left arm steal syndrome  POST-OPERATIVE DIAGNOSIS: same as above   SURGEON: Adele Barthel, MD  ANESTHESIA: local  ESTIMATED BLOOD LOSS: 5 cc  FINDING(S): 1. Widely patent brachiocephalic arteriovenous fistula   2. Large side branch in distal 1/3 feeding brachial system 3. Widely patent left subclavian artery, axillary artery, and brachial artery 4. Widely patent anastomosis  SPECIMEN(S):  None  CONTRAST: 60 cc  INDICATIONS: [name] is a 63 y.o. female who  presents with possible left hand steal syndrome of unknown etiology. I discussed with the patient verifying that she actually had steal and then investigate whether the arterial inflow was the cause.  The patient is scheduled for left arm fistulogram, possible left arm angiogram.  The patient is aware the risks include but are not limited to: bleeding, infection, thrombosis of the cannulated access, and possible anaphylactic reaction to the contrast.  The patient is aware of the risks of the procedure and elects to proceed forward.  DESCRIPTION: After full informed written consent was obtained, the patient was brought back to the angiography suite and placed supine upon the angiography table.  The patient was connected to monitoring equipment.  The left upper arm was prepped and draped in the standard fashion for a left arm fistulogram.  Under ultrasound guidance, the left brachiocephalic arteriovenous fistula was cannulated with a micropuncture needle.  The microwire was advanced into the fistula and the needle was exchanged for the a microsheath, which was lodged 2 cm into the access.  The wire was removed and the sheath was connected to the IV extension tubing.  Hand injections were completed to image the access from the  antecubitum up to the level of axilla.  The central venous structures were also imaged by hand injections.  No intervention on the fistula was needed as the fistula was widely patent.  I placed Benson wire through the anastomosis and rolled the wire into the brachial artery.  I advanced the sheath into the brachial artery.  Hand injections verified hemodynamically significant steal syndrome.  I exchanged the sheath for a short 5-Fr sheath which I placed into the distal fistula.  I then advanced a 4-Fr endhole catheter over the wire, advancing the catheter and wire together into the left subclavian artery.  Via the catheter, I did hand injections to image the left arm arterial system.  No hemodynamically stenosis was evident.  I removed the endhole catheter.  A 4-0 Monocryl purse-string suture was sewn around the sheath.  The sheath was removed while tying down the suture.  A sterile bandage was applied to the puncture site.  Based on the images, I would attempt a banding of this fistula to try to improve the perfusion of the left hand.    COMPLICATIONS: none  CONDITION: stable  Adele Barthel, MD [facility] Office: [phone] Pager: [phone]  08/01/2013 1:42 PM

## 2013-08-01 NOTE — H&P (View-Only) (Signed)
Established Dialysis Access  History of Present Illness  [name] is a 63 y.o. (1950-06-26) female who presents for re-evaluation of left brachiocephalic arteriovenous fistula. The patient is right hand dominant.  Previous access procedures have been completed in the left arm.  The patient's complication from previous access procedures include: steal syndrome.  The patient has never had a previous PPM placed.  She complaints of intermittent severe cramping, numbness, and coldness in her left hand  Past Medical History  Diagnosis Date  . Chest pain 2003    ?h/o MI, syncope; 2003 stress nuclear-inferoseptal scar, EF-54%; 2007-MI ruled out, echocardiogram-normal LV size, wall thickness and function  . RLS (restless legs syndrome)     sleep study in 2010: No apnea; moderate periodic movements  . GERD (gastroesophageal reflux disease)     H/o upper GI bleed  . GI bleeding   . Obese   . Gout   . Hypothyroidism   . Low back pain   . Cellulitis     Neck  . Diabetes mellitus 1998    A1c-7.1 in 2010, 6.7 in 2013  . Hyperlipidemia     Fasting lipids 2013:158, 152, 35, 93  . Hypertension 2002  . Anemia   . Hyperkalemia   . Irregular heart beat   . PONV (postoperative nausea and vomiting)   . Myocardial infarction   . Anginal pain     last cp few months ago  . CAD (coronary artery disease)     dr. Lattie Haw (Linna Hoff)  . Depression     Insomnia  . Chronic kidney disease, stage III (moderate)     Creat-1.9 in 04/2008, 2.65 in 02/2010(noHD  . Headache(784.0)   . Vitiligo   . H/O hiatal hernia   . Arthritis   . Cancer     HX SKIN CANCER  . Renal insufficiency     Past Surgical History  Procedure Laterality Date  . Appendectomy    . Carpal tunnel release  1992, 1993, 2011    Right in 2011; previously bilateral  . Tubal ligation  1976  . Partial hysterectomy  1985    Vaginal  . Shoulder surgery  1997    Left  . Incise and drain abcess  2010    Neck-MRSA  .  Colonoscopy  02/2002, 07/2009    few diverticula, otherwise negative  . Foot surgery  1984    Bilateral heel spurs  . Abdominal hysterectomy    . Shoulder surgery      RIGHT  ADJUSTED   . Av fistula placement Left 07/16/2012    Procedure: ARTERIOVENOUS (AV) FISTULA CREATION;  Surgeon: Mal Misty, MD;  Location: [facility];  Service: Vascular;  Laterality: Left;  . Av fistula placement Left 07/29/2012    Procedure: ARTERIOVENOUS (AV) FISTULA CREATION;  Surgeon: Mal Misty, MD;  Location: [facility];  Service: Vascular;  Laterality: Left;  Left Brachial-Cephalic Arteriovenous Fistula Creation  . Colonoscopy with esophagogastroduodenoscopy (egd) N/A 08/27/2012    RMR: Normal esophagus s/p 54 F dilator. Small hiatal hernia. Gastric polyp removed, oozing (hyperplastic), which could explain heme positive stool:Colonic diverticulosis    History   Social History  . Marital Status: Divorced    Spouse Name: N/A    Number of Children: 3  . Years of Education: N/A   Occupational History  . Disabled     previously Nurse, adult   Social History Main Topics  . Smoking status: Never Smoker   . Smokeless tobacco: Never  Used  . Alcohol Use: No  . Drug Use: No  . Sexual Activity: No   Other Topics Concern  . Not on file   Social History Narrative   No regular exercise    Family History  Problem Relation Age of Onset  . Emphysema Mother   . COPD Mother   . Colon cancer Father   . Diabetes Father   . Cancer Father     colon  . Hypertension Father   . Congestive Heart Failure Maternal Grandmother   . Cancer Paternal Grandmother     Current Outpatient Prescriptions on File Prior to Visit  Medication Sig Dispense Refill  . albuterol (PROAIR HFA) 108 (90 BASE) MCG/ACT inhaler Inhale 2 puffs into the lungs every 6 (six) hours as needed for wheezing or shortness of breath.  1 Inhaler  1  . allopurinol (ZYLOPRIM) 100 MG tablet Take 1 tablet (100 mg total) by mouth every morning.  30 tablet  11  .  amLODipine (NORVASC) 5 MG tablet Take 1 tablet (5 mg total) by mouth daily.  30 tablet  5  . carvedilol (COREG) 25 MG tablet Take 1 tablet (25 mg total) by mouth 2 (two) times daily with a meal.  60 tablet  6  . cephALEXin (KEFLEX) 500 MG capsule Take 1 capsule (500 mg total) by mouth 4 (four) times daily.  28 capsule  0  . citalopram (CELEXA) 40 MG tablet Take 40 mg by mouth daily.      . clobetasol (TEMOVATE) 0.05 % external solution Apply 1 application topically daily. Applied to scalp for psoriasis      . ferrous sulfate 325 (65 FE) MG tablet Take 1 tablet (325 mg total) by mouth daily with breakfast.  30 tablet  5  . furosemide (LASIX) 40 MG tablet Take 1 tablet (40 mg total) by mouth 2 (two) times daily.  60 tablet  5  . gabapentin (NEURONTIN) 800 MG tablet Take 2,400 mg by mouth at bedtime.      . insulin aspart (NOVOLOG FLEXPEN) 100 UNIT/ML FlexPen Inject 45 Units into the skin 3 (three) times daily with meals.      . Insulin Glargine (LANTUS SOLOSTAR) 100 UNIT/ML Solostar Pen Inject 45 Units into the skin 2 (two) times daily. 45 units in the morning and 45 units at night  30 mL  2  . levothyroxine (SYNTHROID, LEVOTHROID) 50 MCG tablet Take 1 tablet (50 mcg total) by mouth every evening.  30 tablet  2  . losartan (COZAAR) 50 MG tablet       . nitroGLYCERIN (NITROSTAT) 0.4 MG SL tablet Place 1 tablet (0.4 mg total) under the tongue every 5 (five) minutes as needed for chest pain.  50 tablet  0  . omeprazole (PRILOSEC) 20 MG capsule Take 20 mg by mouth daily.      . potassium chloride (K-DUR) 10 MEQ tablet Take 10 mEq by mouth 2 (two) times daily.      . pravastatin (PRAVACHOL) 40 MG tablet Take 40 mg by mouth at bedtime.      Marland Kitchen rOPINIRole (REQUIP) 1 MG tablet Take 1 tablet (1 mg total) by mouth at bedtime.  30 tablet  11  . sodium bicarbonate 650 MG tablet Take 650 mg by mouth 2 (two) times daily.      Marland Kitchen tiotropium (SPIRIVA HANDIHALER) 18 MCG inhalation capsule Place 1 capsule (18 mcg total)  into inhaler and inhale every morning.  30 capsule  5  . Vitamin  D, Ergocalciferol, (DRISDOL) 50000 UNITS CAPS capsule Take 50,000 Units by mouth every 7 (seven) days. Monday       No current facility-administered medications on file prior to visit.    Allergies  Allergen Reactions  . Latex Shortness Of Breath and Swelling    Facial swelling  . Vancomycin Hcl Swelling    REACTION: facial swelling  . Quinapril Hcl Swelling and Rash    Arm swelling    REVIEW OF SYSTEMS:  (Positives checked otherwise negative)  CARDIOVASCULAR:  []  chest pain, []  chest pressure, []  palpitations, []  shortness of breath when laying flat, []  shortness of breath with exertion,  []  pain in feet when walking, []  pain in feet when laying flat, []  history of blood clot in veins (DVT), []  history of phlebitis, [x]  swelling in legs, []  varicose veins  PULMONARY:  []  productive cough, []  asthma, []  wheezing  NEUROLOGIC:  []  weakness in arms or legs, [x]  numbness in arms or legs, []  difficulty speaking or slurred speech, []  temporary loss of vision in one eye, []  dizziness  HEMATOLOGIC:  []  bleeding problems, []  problems with blood clotting too easily  MUSCULOSKEL:  []  joint pain, []  joint swelling  GASTROINTEST:  []  vomiting blood, []  blood in stool     GENITOURINARY:  []  burning with urination, []  blood in urine  PSYCHIATRIC:  []  history of major depression  INTEGUMENTARY:  []  rashes, []  ulcers  Physical Examination  Filed Vitals:   07/29/13 1545  BP: 127/98  Pulse: 72  Height: 5\' 7"  (1.702 m)  Weight: 226 lb (102.513 kg)  SpO2: 98%   Body mass index is 35.39 kg/(m^2).  General: A&O x 3, WD, obese  Pulmonary: Sym exp, good air movt, CTAB, no rales, rhonchi, & wheezing  Cardiac: RRR, Nl S1, S2, no Murmurs, rubs or gallops Vascular: Vessel Right Left  Radial Faintly Palpable Not Palpable  Ulnar Not Palpable Not Palpable  Brachial Palpable Palpable   Gastrointestinal: soft, NTND, -G/R, -  HSM, - masses, - CVAT B  Musculoskeletal: M/S 5/5 throughout , Extremities without  ischemic changes , palpable thrill in access in L BC AVF, + bruit in access, left hand somewhat cyanotic, CR <4 sec  Neurologic: Pain and light touch intact in extremities , Motor exam as listed above  Non-Invasive Vascular Imaging  Left arm arterial Duplex  (Date: 07/29/2013):   SCA: mono  Axillary: mono  Brach: mono  Radial: Bi  Ulnar: Bi/tri  Medical Decision Making  [name] is a 63 y.o. female who presents with chronic kidney disease stage V complicated with likely steal syndrome  The patient likely has inflow compromise as evident by the high PSV in the arterial system of the L arm  I discussed with the patient the nature of angiographic procedures, especially the limited patencies of any endovascular intervention.  The patient is aware of that the risks of an angiographic procedure include but are not limited to: bleeding, infection, access site complications, renal failure, embolization, rupture of vessel, dissection, possible need for emergent surgical intervention, possible need for surgical procedures to treat the patient's pathology, anaphylactic reaction to contrast, and stroke and death.    The patient is aware of the risks and agrees to proceed.  Adele Barthel, MD [facility] Office: [phone] Pager: [phone]  07/29/2013, 4:07 PM

## 2013-08-01 NOTE — Discharge Instructions (Signed)
AV Fistula °Care After °Refer to this sheet in the next few weeks. These instructions provide you with information on caring for yourself after your procedure. Your caregiver may also give you more specific instructions. Your treatment has been planned according to current medical practices, but problems sometimes occur. Call your caregiver if you have any problems or questions after your procedure. °HOME CARE INSTRUCTIONS  °· Do not drive a car or take public transportation alone. °· Do not drink alcohol. °· Only take medicine that has been prescribed by your caregiver. °· Do not sign important papers or make important decisions. °· Have a responsible person with you. °· Ask your caregiver to show you how to check your access at home for a vibration (called a "thrill") or for a sound (called a "bruit" pronounced brew-ee). °· Your vein will need time to enlarge and mature so needles can be inserted for dialysis. Follow your caregiver's instructions about what you need to do to make this happen. °· Keep dressings clean and dry. °· Keep the arm elevated above your heart. Use a pillow. °· Rest. °· Use the arm as usual for all activities. °· Have the stitches or tape closures removed in 10 to 14 days, or as directed by your caregiver. °· Do not sleep or lie on the area of the fistula or that arm. This may decrease or stop the blood flow through your fistula. °· Do not allow blood pressures to be taken on this arm. °· Do not allow blood drawing to be done from the graft. °· Do not wear tight clothing around the access site or on the arm. °· Avoid lifting heavy objects with the arm that has the fistula. °· Do not use creams or lotions over the access site. °SEEK MEDICAL CARE IF:  °· You have a fever. °· You have swelling around the fistula that gets worse, or you have new pain. °· You have unusual bleeding at the fistula site or from any other area. °· You have pus or other drainage at the fistula site. °· You have skin  redness or red streaking on the skin around, above, or below the fistula site. °· Your access site feels warm. °· You have any flu-like symptoms. °SEEK IMMEDIATE MEDICAL CARE IF:  °· You have pain, numbness, or an unusual pale skin on the hand or on the side of your fistula. °· You have dizziness or weakness that you have not had before. °· You have shortness of breath. °· You have chest pain. °· Your fistula disconnects or breaks, and there is bleeding that cannot be easily controlled. °Call for local emergency medical help. Do not try to drive yourself to the hospital. °MAKE SURE YOU °· Understand these instructions. °· Will watch your condition. °· Will get help right away if you are not doing well or get worse. °Document Released: 03/10/2005 Document Revised: 06/02/2011 Document Reviewed: 08/28/2010 °Exit[employer]® Patient Information ©2014 [employer]. ° °

## 2013-08-01 NOTE — Interval H&P Note (Signed)
[facility] [place]  History and Physical Update  The patient was interviewed and re-examined.  The patient's previous History and Physical has been reviewed and is unchanged.  There is no change in the plan of care: Left arm fistulogram.  Adele Barthel, MD [facility] Office: [phone] Pager: [phone]  08/01/2013, 10:45 AM

## 2013-08-02 ENCOUNTER — Encounter (HOSPITAL_COMMUNITY): Payer: Self-pay | Admitting: *Deleted

## 2013-08-02 ENCOUNTER — Encounter (HOSPITAL_COMMUNITY): Payer: Self-pay | Admitting: Pharmacy Technician

## 2013-08-02 MED ORDER — SODIUM CHLORIDE 0.9 % IV SOLN: INTRAVENOUS | Status: DC

## 2013-08-02 MED ORDER — CHLORHEXIDINE GLUCONATE CLOTH 2 % EX PADS: 6.0000 | MEDICATED_PAD | Freq: Once | CUTANEOUS | Status: DC

## 2013-08-02 MED ORDER — DEXTROSE 5 % IV SOLN
1.5000 g | INTRAVENOUS | Status: AC
  Administered 2013-08-03: 1.5 g via INTRAVENOUS
  Filled 2013-08-02: qty 1.5

## 2013-08-03 ENCOUNTER — Encounter (HOSPITAL_COMMUNITY): Payer: Self-pay | Admitting: *Deleted

## 2013-08-03 ENCOUNTER — Encounter (HOSPITAL_COMMUNITY): Payer: Commercial Managed Care - HMO | Admitting: Certified Registered Nurse Anesthetist

## 2013-08-03 ENCOUNTER — Ambulatory Visit (HOSPITAL_COMMUNITY): Payer: Commercial Managed Care - HMO | Admitting: Certified Registered Nurse Anesthetist

## 2013-08-03 ENCOUNTER — Ambulatory Visit (HOSPITAL_COMMUNITY)
Admission: RE | Admit: 2013-08-03 | Discharge: 2013-08-03 | Disposition: A | Discharge status: Home / Self Care | Payer: Commercial Managed Care - HMO | Source: Ambulatory Visit | Attending: Vascular Surgery | Admitting: Vascular Surgery

## 2013-08-03 ENCOUNTER — Encounter (HOSPITAL_COMMUNITY): Payer: Commercial Managed Care - HMO | Admitting: Anesthesiology

## 2013-08-03 ENCOUNTER — Encounter (HOSPITAL_COMMUNITY)
Admission: RE | Disposition: A | Discharge status: Home / Self Care | Payer: Self-pay | Source: Ambulatory Visit | Attending: Vascular Surgery

## 2013-08-03 ENCOUNTER — Ambulatory Visit (HOSPITAL_COMMUNITY): Payer: Commercial Managed Care - HMO | Admitting: Anesthesiology

## 2013-08-03 DIAGNOSIS — I120 Hypertensive chronic kidney disease with stage 5 chronic kidney disease or end stage renal disease: Secondary | ICD-10-CM | POA: Diagnosis not present

## 2013-08-03 DIAGNOSIS — M109 Gout, unspecified: Secondary | ICD-10-CM | POA: Diagnosis not present

## 2013-08-03 DIAGNOSIS — N185 Chronic kidney disease, stage 5: Secondary | ICD-10-CM | POA: Insufficient documentation

## 2013-08-03 DIAGNOSIS — K219 Gastro-esophageal reflux disease without esophagitis: Secondary | ICD-10-CM | POA: Insufficient documentation

## 2013-08-03 DIAGNOSIS — Z794 Long term (current) use of insulin: Secondary | ICD-10-CM | POA: Diagnosis not present

## 2013-08-03 DIAGNOSIS — E785 Hyperlipidemia, unspecified: Secondary | ICD-10-CM | POA: Insufficient documentation

## 2013-08-03 DIAGNOSIS — E039 Hypothyroidism, unspecified: Secondary | ICD-10-CM | POA: Diagnosis not present

## 2013-08-03 DIAGNOSIS — Z79899 Other long term (current) drug therapy: Secondary | ICD-10-CM | POA: Insufficient documentation

## 2013-08-03 DIAGNOSIS — E119 Type 2 diabetes mellitus without complications: Secondary | ICD-10-CM | POA: Diagnosis not present

## 2013-08-03 DIAGNOSIS — E669 Obesity, unspecified: Secondary | ICD-10-CM | POA: Diagnosis not present

## 2013-08-03 DIAGNOSIS — I252 Old myocardial infarction: Secondary | ICD-10-CM | POA: Diagnosis not present

## 2013-08-03 DIAGNOSIS — I2510 Atherosclerotic heart disease of native coronary artery without angina pectoris: Secondary | ICD-10-CM | POA: Diagnosis not present

## 2013-08-03 DIAGNOSIS — D649 Anemia, unspecified: Secondary | ICD-10-CM | POA: Insufficient documentation

## 2013-08-03 DIAGNOSIS — Y929 Unspecified place or not applicable: Secondary | ICD-10-CM | POA: Diagnosis not present

## 2013-08-03 DIAGNOSIS — N186 End stage renal disease: Secondary | ICD-10-CM

## 2013-08-03 DIAGNOSIS — G2581 Restless legs syndrome: Secondary | ICD-10-CM | POA: Insufficient documentation

## 2013-08-03 DIAGNOSIS — Y832 Surgical operation with anastomosis, bypass or graft as the cause of abnormal reaction of the patient, or of later complication, without mention of misadventure at the time of the procedure: Secondary | ICD-10-CM | POA: Diagnosis not present

## 2013-08-03 DIAGNOSIS — T82898A Other specified complication of vascular prosthetic devices, implants and grafts, initial encounter: Secondary | ICD-10-CM | POA: Insufficient documentation

## 2013-08-03 DIAGNOSIS — Z85828 Personal history of other malignant neoplasm of skin: Secondary | ICD-10-CM | POA: Insufficient documentation

## 2013-08-03 DIAGNOSIS — E875 Hyperkalemia: Secondary | ICD-10-CM | POA: Diagnosis not present

## 2013-08-03 DIAGNOSIS — Z91040 Latex allergy status: Secondary | ICD-10-CM | POA: Insufficient documentation

## 2013-08-03 DIAGNOSIS — Z6835 Body mass index (BMI) 35.0-35.9, adult: Secondary | ICD-10-CM | POA: Insufficient documentation

## 2013-08-03 HISTORY — DX: Cardiac arrhythmia, unspecified: I49.9

## 2013-08-03 HISTORY — PX: REVISON OF ARTERIOVENOUS FISTULA: SHX6074

## 2013-08-03 HISTORY — PX: THROMBECTOMY W/ EMBOLECTOMY: SHX2507

## 2013-08-03 HISTORY — DX: Chronic obstructive pulmonary disease, unspecified: J44.9

## 2013-08-03 LAB — POCT I-STAT 4, (NA,K, GLUC, HGB,HCT)
Glucose, Bld: 146 mg/dL — ABNORMAL HIGH (ref 70–99)
HCT: 35 % — ABNORMAL LOW (ref 36.0–46.0)
Hemoglobin: 11.9 g/dL — ABNORMAL LOW (ref 12.0–15.0)
Potassium: 3.4 mEq/L — ABNORMAL LOW (ref 3.7–5.3)
Sodium: 141 mEq/L (ref 137–147)

## 2013-08-03 LAB — GLUCOSE, CAPILLARY
GLUCOSE-CAPILLARY: 145 mg/dL — AB (ref 70–99)
GLUCOSE-CAPILLARY: 118 mg/dL — AB (ref 70–99)
Glucose-Capillary: 110 mg/dL — ABNORMAL HIGH (ref 70–99)
Glucose-Capillary: 91 mg/dL (ref 70–99)

## 2013-08-03 SURGERY — THROMBECTOMY ARTERIOVENOUS FISTULA
Anesthesia: Monitor Anesthesia Care | Site: Arm Upper | Laterality: Left

## 2013-08-03 SURGERY — REVISON OF ARTERIOVENOUS FISTULA
Anesthesia: Monitor Anesthesia Care | Site: Arm Upper | Laterality: Left

## 2013-08-03 MED ORDER — PROPOFOL 10 MG/ML IV BOLUS
INTRAVENOUS | Status: AC
  Filled 2013-08-03: qty 20

## 2013-08-03 MED ORDER — ONDANSETRON HCL 4 MG/2ML IJ SOLN: 4.0000 mg | Freq: Four times a day (QID) | INTRAMUSCULAR | Status: DC | PRN

## 2013-08-03 MED ORDER — FENTANYL CITRATE 0.05 MG/ML IJ SOLN
INTRAMUSCULAR | Status: AC
  Filled 2013-08-03: qty 5

## 2013-08-03 MED ORDER — FENTANYL CITRATE 0.05 MG/ML IJ SOLN
INTRAMUSCULAR | Status: DC | PRN
  Administered 2013-08-03: 50 ug via INTRAVENOUS

## 2013-08-03 MED ORDER — SODIUM CHLORIDE 0.9 % IV SOLN
INTRAVENOUS | Status: DC | PRN
  Administered 2013-08-03: 12:00:00 via INTRAVENOUS

## 2013-08-03 MED ORDER — OXYCODONE HCL 5 MG PO TABS: 5.0000 mg | ORAL_TABLET | Freq: Once | ORAL | Status: DC | PRN

## 2013-08-03 MED ORDER — 0.9 % SODIUM CHLORIDE (POUR BTL) OPTIME
TOPICAL | Status: DC | PRN
  Administered 2013-08-03: 1000 mL

## 2013-08-03 MED ORDER — FENTANYL CITRATE 0.05 MG/ML IJ SOLN: 25.0000 ug | INTRAMUSCULAR | Status: DC | PRN

## 2013-08-03 MED ORDER — BUPIVACAINE HCL (PF) 0.5 % IJ SOLN
INTRAMUSCULAR | Status: DC | PRN
  Administered 2013-08-03: 30 mL

## 2013-08-03 MED ORDER — THROMBIN 20000 UNITS EX SOLR
CUTANEOUS | Status: AC
  Filled 2013-08-03: qty 20000

## 2013-08-03 MED ORDER — SODIUM CHLORIDE 0.9 % IV SOLN
INTRAVENOUS | Status: DC
  Administered 2013-08-03: 11:00:00 via INTRAVENOUS

## 2013-08-03 MED ORDER — SODIUM CHLORIDE 0.9 % IV SOLN
INTRAVENOUS | Status: DC | PRN
  Administered 2013-08-03: 14:00:00 via INTRAVENOUS

## 2013-08-03 MED ORDER — MIDAZOLAM HCL 2 MG/2ML IJ SOLN
INTRAMUSCULAR | Status: AC
  Filled 2013-08-03: qty 2

## 2013-08-03 MED ORDER — OXYCODONE HCL 5 MG/5ML PO SOLN: 5.0000 mg | Freq: Once | ORAL | Status: DC | PRN

## 2013-08-03 MED ORDER — OXYCODONE HCL 5 MG PO TABS: 5.0000 mg | ORAL_TABLET | ORAL | Status: DC | PRN

## 2013-08-03 MED ORDER — FENTANYL CITRATE 0.05 MG/ML IJ SOLN
25.0000 ug | INTRAMUSCULAR | Status: DC | PRN
Start: 2013-08-03 — End: 2013-08-03

## 2013-08-03 MED ORDER — PROPOFOL INFUSION 10 MG/ML OPTIME
INTRAVENOUS | Status: DC | PRN
  Administered 2013-08-03: 100 ug/kg/min via INTRAVENOUS

## 2013-08-03 MED ORDER — SODIUM CHLORIDE 0.9 % IR SOLN
Status: DC | PRN
  Administered 2013-08-03: 1000 mL

## 2013-08-03 MED ORDER — LIDOCAINE-EPINEPHRINE (PF) 1 %-1:200000 IJ SOLN
INTRAMUSCULAR | Status: DC | PRN
  Administered 2013-08-03: 30 mL

## 2013-08-03 MED ORDER — SODIUM CHLORIDE 0.9 % IR SOLN
Status: DC | PRN
  Administered 2013-08-03: 14:00:00

## 2013-08-03 MED ORDER — ONDANSETRON HCL 4 MG/2ML IJ SOLN
INTRAMUSCULAR | Status: DC | PRN
  Administered 2013-08-03: 4 mg via INTRAVENOUS

## 2013-08-03 MED ORDER — BUPIVACAINE HCL (PF) 0.5 % IJ SOLN
INTRAMUSCULAR | Status: DC | PRN
  Administered 2013-08-03: 5 mL

## 2013-08-03 MED ORDER — MIDAZOLAM HCL 5 MG/5ML IJ SOLN
INTRAMUSCULAR | Status: DC | PRN
  Administered 2013-08-03: 2 mg via INTRAVENOUS

## 2013-08-03 MED ORDER — HEPARIN SODIUM (PORCINE) 5000 UNIT/ML IJ SOLN
INTRAMUSCULAR | Status: DC | PRN
  Administered 2013-08-03: 13:00:00

## 2013-08-03 MED ORDER — LIDOCAINE-EPINEPHRINE (PF) 1 %-1:200000 IJ SOLN
INTRAMUSCULAR | Status: DC | PRN
  Administered 2013-08-03: 5 mL

## 2013-08-03 SURGICAL SUPPLY — 37 items
BLADE 10 SAFETY STRL DISP (BLADE) IMPLANT
CANISTER SUCTION 2500CC (MISCELLANEOUS) ×3 IMPLANT
CLIP TI MEDIUM 6 (CLIP) ×3 IMPLANT
CLIP TI WIDE RED SMALL 6 (CLIP) ×3 IMPLANT
COVER PROBE W GEL 5X96 (DRAPES) IMPLANT
COVER SURGICAL LIGHT HANDLE (MISCELLANEOUS) ×3 IMPLANT
DECANTER SPIKE VIAL GLASS SM (MISCELLANEOUS) ×3 IMPLANT
DERMABOND ADVANCED (GAUZE/BANDAGES/DRESSINGS) ×2
DERMABOND ADVANCED .7 DNX12 (GAUZE/BANDAGES/DRESSINGS) ×1 IMPLANT
DRAIN PENROSE 1/2X12 LTX STRL (WOUND CARE) IMPLANT
ELECT REM PT RETURN 9FT ADLT (ELECTROSURGICAL) ×3
ELECTRODE REM PT RTRN 9FT ADLT (ELECTROSURGICAL) ×1 IMPLANT
GLOVE BIO SURGEON STRL SZ7 (GLOVE) IMPLANT
GLOVE BIOGEL PI IND STRL 7.0 (GLOVE) ×1 IMPLANT
GLOVE BIOGEL PI IND STRL 7.5 (GLOVE) ×2 IMPLANT
GLOVE BIOGEL PI INDICATOR 7.0 (GLOVE) ×2
GLOVE BIOGEL PI INDICATOR 7.5 (GLOVE) ×4
GLOVE SURG SS PI 6.5 STRL IVOR (GLOVE) ×3 IMPLANT
GOWN STRL REUS W/ TWL LRG LVL3 (GOWN DISPOSABLE) ×2 IMPLANT
GOWN STRL REUS W/TWL LRG LVL3 (GOWN DISPOSABLE) ×4
GRAFT GORETEX 6X10 (Vascular Products) ×3 IMPLANT
KIT BASIN OR (CUSTOM PROCEDURE TRAY) ×3 IMPLANT
KIT ROOM TURNOVER OR (KITS) ×3 IMPLANT
NS IRRIG 1000ML POUR BTL (IV SOLUTION) ×3 IMPLANT
PACK CV ACCESS (CUSTOM PROCEDURE TRAY) ×3 IMPLANT
PAD ARMBOARD 7.5X6 YLW CONV (MISCELLANEOUS) ×6 IMPLANT
SPONGE SURGIFOAM ABS GEL 100 (HEMOSTASIS) IMPLANT
SUT MNCRL AB 4-0 PS2 18 (SUTURE) ×3 IMPLANT
SUT PROLENE 5 0 C 1 24 (SUTURE) ×3 IMPLANT
SUT PROLENE 6 0 BV (SUTURE) ×3 IMPLANT
SUT PROLENE 7 0 BV 1 (SUTURE) IMPLANT
SUT VIC AB 3-0 SH 27 (SUTURE) ×2
SUT VIC AB 3-0 SH 27X BRD (SUTURE) ×1 IMPLANT
TOWEL OR 17X24 6PK STRL BLUE (TOWEL DISPOSABLE) ×3 IMPLANT
TOWEL OR 17X26 10 PK STRL BLUE (TOWEL DISPOSABLE) ×3 IMPLANT
UNDERPAD 30X30 INCONTINENT (UNDERPADS AND DIAPERS) ×3 IMPLANT
WATER STERILE IRR 1000ML POUR (IV SOLUTION) ×3 IMPLANT

## 2013-08-03 SURGICAL SUPPLY — 40 items
ARMBAND PINK RESTRICT EXTREMIT (MISCELLANEOUS) ×3 IMPLANT
BLADE 10 SAFETY STRL DISP (BLADE) ×3 IMPLANT
CANISTER SUCTION 2500CC (MISCELLANEOUS) ×3 IMPLANT
CATH EMB 4FR 80CM (CATHETERS) ×3 IMPLANT
CATH EMB LATEX FREE 4FRX80CM (CATHETERS) ×2
CATH EMB LF 4FRX80 (CATHETERS) ×1 IMPLANT
CLIP TI MEDIUM 6 (CLIP) ×3 IMPLANT
CLIP TI WIDE RED SMALL 6 (CLIP) ×3 IMPLANT
COVER SURGICAL LIGHT HANDLE (MISCELLANEOUS) ×3 IMPLANT
DERMABOND ADVANCED (GAUZE/BANDAGES/DRESSINGS) ×2
DERMABOND ADVANCED .7 DNX12 (GAUZE/BANDAGES/DRESSINGS) ×1 IMPLANT
DRAPE X-RAY CASS 24X20 (DRAPES) IMPLANT
ELECT REM PT RETURN 9FT ADLT (ELECTROSURGICAL) ×3
ELECTRODE REM PT RTRN 9FT ADLT (ELECTROSURGICAL) ×1 IMPLANT
GAUZE SPONGE 4X4 16PLY XRAY LF (GAUZE/BANDAGES/DRESSINGS) IMPLANT
GEL ULTRASOUND 20GR AQUASONIC (MISCELLANEOUS) IMPLANT
GLOVE BIO SURGEON STRL SZ7 (GLOVE) ×3 IMPLANT
GLOVE BIOGEL PI IND STRL 7.5 (GLOVE) ×1 IMPLANT
GLOVE BIOGEL PI INDICATOR 7.5 (GLOVE) ×2
GOWN STRL REUS W/ TWL LRG LVL3 (GOWN DISPOSABLE) ×3 IMPLANT
GOWN STRL REUS W/TWL LRG LVL3 (GOWN DISPOSABLE) ×6
INSERT FOGARTY SM (MISCELLANEOUS) ×3 IMPLANT
KIT BASIN OR (CUSTOM PROCEDURE TRAY) ×3 IMPLANT
KIT ROOM TURNOVER OR (KITS) ×3 IMPLANT
NS IRRIG 1000ML POUR BTL (IV SOLUTION) ×3 IMPLANT
PACK CV ACCESS (CUSTOM PROCEDURE TRAY) ×3 IMPLANT
PAD ARMBOARD 7.5X6 YLW CONV (MISCELLANEOUS) ×6 IMPLANT
SET COLLECT BLD 21X3/4 12 (NEEDLE) IMPLANT
SPONGE SURGIFOAM ABS GEL 100 (HEMOSTASIS) IMPLANT
STOPCOCK 4 WAY LG BORE MALE ST (IV SETS) IMPLANT
SUT MNCRL AB 4-0 PS2 18 (SUTURE) ×3 IMPLANT
SUT PROLENE 5 0 C 1 24 (SUTURE) ×3 IMPLANT
SUT PROLENE 6 0 BV (SUTURE) ×3 IMPLANT
SUT VIC AB 3-0 SH 27 (SUTURE) ×2
SUT VIC AB 3-0 SH 27X BRD (SUTURE) ×1 IMPLANT
TOWEL OR 17X24 6PK STRL BLUE (TOWEL DISPOSABLE) ×3 IMPLANT
TOWEL OR 17X26 10 PK STRL BLUE (TOWEL DISPOSABLE) ×3 IMPLANT
TUBING EXTENTION W/L.L. (IV SETS) IMPLANT
UNDERPAD 30X30 INCONTINENT (UNDERPADS AND DIAPERS) ×3 IMPLANT
WATER STERILE IRR 1000ML POUR (IV SOLUTION) ×3 IMPLANT

## 2013-08-03 NOTE — Anesthesia Postprocedure Evaluation (Signed)
Anesthesia Post Note  Patient: [name]  Procedure(s) Performed: Procedure(s) (LRB): BANDING OF BRACHIOCEPHALIC ARTERIOVENOUS FISTULA (Left)  Anesthesia type: MAC  Patient location: PACU  Post pain: Pain level controlled and Adequate analgesia  Post assessment: Post-op Vital signs reviewed, Patient's Cardiovascular Status Stable and Respiratory Function Stable  Last Vitals:  Filed Vitals:   08/03/13 1511  BP: 126/67  Pulse: 58  Temp: 36.6 C  Resp: 15    Post vital signs: Reviewed and stable  Level of consciousness: awake, alert  and oriented  Complications: No apparent anesthesia complications.  Graft is not functioning properly, pt will go back to OR.

## 2013-08-03 NOTE — Op Note (Signed)
    OPERATIVE NOTE   PROCEDURE: 1. Thrombectomy of left brachiocephalic arteriovenous fistula   2. Plication of left brachiocephalic arteriovenous fistula    PRE-OPERATIVE DIAGNOSIS: thrombosed left brachiocephalic arteriovenous fistula    POST-OPERATIVE DIAGNOSIS: same as above   SURGEON: Adele Barthel, MD  ANESTHESIA: local and MAC  ESTIMATED BLOOD LOSS: 30 cc  FINDING(S): 1. Palpable thrill and palpable radial pulse at end of case  SPECIMEN(S):  none  INDICATIONS:   [name] is a 63 y.o. female who presents with acute loss of brachiocephalic arteriovenous fistula thrill after banding of left brachiocephalic arteriovenous fistula.  I suspected likely thrombosis of the left brachiocephalic arteriovenous fistula due to the banding.  I discussed completing a thrombectomy on the left brachiocephalic arteriovenous fistula and then revising the banding.  Risk, benefits, and alternatives to access surgery were discussed.  The patient is aware the risks include but are not limited to: bleeding, infection, steal syndrome, nerve damage, inability to salvage the access, re-thrombosis of the fistula, need for additional procedures, death and stroke.  The patient agrees to proceed forward with the procedure.  DESCRIPTION: After obtaining full informed written consent, the patient was brought back to the operating room and placed supine upon the operating table.  The patient received IV antibiotics prior to induction.  After obtaining adequate anesthesia, the patient was prepped and draped in the standard fashion for: a left arm access.  I injected 5 cc of 1% lidocaine with epinephrine to anesthesize the skin previously incised.  I took off the Dermabond placed earlier today.  I sharply transected the subcuticular and subcutaneous sutures to get access to the distal fistula.  I sharply removed the sutures in the band around the fistula and removed the graft band.  Initially, there was no pulse in  the fistula then the thrill resumed.  I placed a vessel loop around the fistula and clamped the fistula proximally in this incision.  I placed the distal fistula under tension and made a transverse incision in the fistula.  I passed a 4 Fogarty balloon distally and obtained not thrombus on this pass.  I clamped the fistula distally and removed the vessel loop.  I repaired the fistula with a running stitch of 6-0 Prolene.  I removed all clamps.  There was now a strong thrill the brachiocephalic arteriovenous fistula and loss of the radial pulse.  I suspect the graft caused the fistula to bunch even though the graft band was >=4 mm in diameter, so I elected to plicate this fistula.  I oversewed the distal 3 cm of the fistula with a 5-0 Prolene in a running fashion to taper the distal fistula.  I repeated this one more time to taper the distal fistula down to 4 mm.  This maintained a thrill in the fistula and palpable radial pulse.  The incision was washed out and bleeding controlled with electrocautery.  I repaired this incision with a double layer of 3-0 Vicryl and a layer of 4-0 Monocryl in a subcuticular fashion.  The skin was cleaned, dried, and reinforced with Dermabond.  At the end of the case, there was a palpable thrill and weakly palpable radial pulse.    COMPLICATIONS: none  CONDITION: stable  Adele Barthel, MD [facility] [place] Office: [phone] Pager: [phone]  08/03/2013, 3:03 PM

## 2013-08-03 NOTE — Interval H&P Note (Signed)
History and Physical Interval Note:  08/03/2013 11:18 AM  [name]  has presented today for surgery, with the diagnosis of Steal syndrome as complication of dialysis access  The various methods of treatment have been discussed with the patient and family. After consideration of risks, benefits and other options for treatment, the patient has consented to  Procedure(s): BANDING OF BRACHIOCEPHALIC ARTERIOVENOUS FISTULA (Left) as a surgical intervention .  The patient's history has been reviewed, patient examined, no change in status, stable for surgery.  I have reviewed the patient's chart and labs.  Questions were answered to the patient's satisfaction.     Conrad Iliamna

## 2013-08-03 NOTE — H&P (View-Only) (Signed)
Established Dialysis Access  History of Present Illness  [name] is a 63 y.o. ([birth year]) female who presents for re-evaluation of left brachiocephalic arteriovenous fistula. The patient is right hand dominant.  Previous access procedures have been completed in the left arm.  The patient's complication from previous access procedures include: steal syndrome.  The patient has never had a previous PPM placed.  She complaints of intermittent severe cramping, numbness, and coldness in her left hand  Past Medical History  Diagnosis Date  . Chest pain 2003    ?h/o MI, syncope; 2003 stress nuclear-inferoseptal scar, EF-54%; 2007-MI ruled out, echocardiogram-normal LV size, wall thickness and function  . RLS (restless legs syndrome)     sleep study in 2010: No apnea; moderate periodic movements  . GERD (gastroesophageal reflux disease)     H/o upper GI bleed  . GI bleeding   . Obese   . Gout   . Hypothyroidism   . Low back pain   . Cellulitis     Neck  . Diabetes mellitus 1998    A1c-7.1 in 2010, 6.7 in 2013  . Hyperlipidemia     Fasting lipids 2013:158, 152, 35, 93  . Hypertension 2002  . Anemia   . Hyperkalemia   . Irregular heart beat   . PONV (postoperative nausea and vomiting)   . Myocardial infarction   . Anginal pain     last cp few months ago  . CAD (coronary artery disease)     dr. Lattie Haw ([facility])  . Depression     Insomnia  . Chronic kidney disease, stage III (moderate)     Creat-1.9 in 04/2008, 2.65 in 02/2010(noHD  . Headache(784.0)   . Vitiligo   . H/O hiatal hernia   . Arthritis   . Cancer     HX SKIN CANCER  . Renal insufficiency     Past Surgical History  Procedure Laterality Date  . Appendectomy    . Carpal tunnel release  1992, 1993, 2011    Right in 2011; previously bilateral  . Tubal ligation  1976  . Partial hysterectomy  1985    Vaginal  . Shoulder surgery  1997    Left  . Incise and drain abcess  2010    Neck-MRSA  .  Colonoscopy  02/2002, 07/2009    few diverticula, otherwise negative  . Foot surgery  1984    Bilateral heel spurs  . Abdominal hysterectomy    . Shoulder surgery      RIGHT  ADJUSTED   . Av fistula placement Left 07/16/2012    Procedure: ARTERIOVENOUS (AV) FISTULA CREATION;  Surgeon: Mal Misty, MD;  Location: [place];  Service: Vascular;  Laterality: Left;  . Av fistula placement Left 07/29/2012    Procedure: ARTERIOVENOUS (AV) FISTULA CREATION;  Surgeon: Mal Misty, MD;  Location: [place];  Service: Vascular;  Laterality: Left;  Left Brachial-Cephalic Arteriovenous Fistula Creation  . Colonoscopy with esophagogastroduodenoscopy (egd) N/A 08/27/2012    RMR: Normal esophagus s/p 54 F dilator. Small hiatal hernia. Gastric polyp removed, oozing (hyperplastic), which could explain heme positive stool:Colonic diverticulosis    History   Social History  . Marital Status: Divorced    Spouse Name: N/A    Number of Children: 3  . Years of Education: N/A   Occupational History  . Disabled     previously Nurse, adult   Social History Main Topics  . Smoking status: Never Smoker   . Smokeless tobacco: Never  Used  . Alcohol Use: No  . Drug Use: No  . Sexual Activity: No   Other Topics Concern  . Not on file   Social History Narrative   No regular exercise    Family History  Problem Relation Age of Onset  . Emphysema Mother   . COPD Mother   . Colon cancer Father   . Diabetes Father   . Cancer Father     colon  . Hypertension Father   . Congestive Heart Failure Maternal Grandmother   . Cancer Paternal Grandmother     Current Outpatient Prescriptions on File Prior to Visit  Medication Sig Dispense Refill  . albuterol (PROAIR HFA) 108 (90 BASE) MCG/ACT inhaler Inhale 2 puffs into the lungs every 6 (six) hours as needed for wheezing or shortness of breath.  1 Inhaler  1  . allopurinol (ZYLOPRIM) 100 MG tablet Take 1 tablet (100 mg total) by mouth every morning.  30 tablet  11  .  amLODipine (NORVASC) 5 MG tablet Take 1 tablet (5 mg total) by mouth daily.  30 tablet  5  . carvedilol (COREG) 25 MG tablet Take 1 tablet (25 mg total) by mouth 2 (two) times daily with a meal.  60 tablet  6  . cephALEXin (KEFLEX) 500 MG capsule Take 1 capsule (500 mg total) by mouth 4 (four) times daily.  28 capsule  0  . citalopram (CELEXA) 40 MG tablet Take 40 mg by mouth daily.      . clobetasol (TEMOVATE) 0.05 % external solution Apply 1 application topically daily. Applied to scalp for psoriasis      . ferrous sulfate 325 (65 FE) MG tablet Take 1 tablet (325 mg total) by mouth daily with breakfast.  30 tablet  5  . furosemide (LASIX) 40 MG tablet Take 1 tablet (40 mg total) by mouth 2 (two) times daily.  60 tablet  5  . gabapentin (NEURONTIN) 800 MG tablet Take 2,400 mg by mouth at bedtime.      . insulin aspart (NOVOLOG FLEXPEN) 100 UNIT/ML FlexPen Inject 45 Units into the skin 3 (three) times daily with meals.      . Insulin Glargine (LANTUS SOLOSTAR) 100 UNIT/ML Solostar Pen Inject 45 Units into the skin 2 (two) times daily. 45 units in the morning and 45 units at night  30 mL  2  . levothyroxine (SYNTHROID, LEVOTHROID) 50 MCG tablet Take 1 tablet (50 mcg total) by mouth every evening.  30 tablet  2  . losartan (COZAAR) 50 MG tablet       . nitroGLYCERIN (NITROSTAT) 0.4 MG SL tablet Place 1 tablet (0.4 mg total) under the tongue every 5 (five) minutes as needed for chest pain.  50 tablet  0  . omeprazole (PRILOSEC) 20 MG capsule Take 20 mg by mouth daily.      . potassium chloride (K-DUR) 10 MEQ tablet Take 10 mEq by mouth 2 (two) times daily.      . pravastatin (PRAVACHOL) 40 MG tablet Take 40 mg by mouth at bedtime.      Marland Kitchen rOPINIRole (REQUIP) 1 MG tablet Take 1 tablet (1 mg total) by mouth at bedtime.  30 tablet  11  . sodium bicarbonate 650 MG tablet Take 650 mg by mouth 2 (two) times daily.      Marland Kitchen tiotropium (SPIRIVA HANDIHALER) 18 MCG inhalation capsule Place 1 capsule (18 mcg total)  into inhaler and inhale every morning.  30 capsule  5  . Vitamin  D, Ergocalciferol, (DRISDOL) 50000 UNITS CAPS capsule Take 50,000 Units by mouth every 7 (seven) days. Monday       No current facility-administered medications on file prior to visit.    Allergies  Allergen Reactions  . Latex Shortness Of Breath and Swelling    Facial swelling  . Vancomycin Hcl Swelling    REACTION: facial swelling  . Quinapril Hcl Swelling and Rash    Arm swelling    REVIEW OF SYSTEMS:  (Positives checked otherwise negative)  CARDIOVASCULAR:  []  chest pain, []  chest pressure, []  palpitations, []  shortness of breath when laying flat, []  shortness of breath with exertion,  []  pain in feet when walking, []  pain in feet when laying flat, []  history of blood clot in veins (DVT), []  history of phlebitis, [x]  swelling in legs, []  varicose veins  PULMONARY:  []  productive cough, []  asthma, []  wheezing  NEUROLOGIC:  []  weakness in arms or legs, [x]  numbness in arms or legs, []  difficulty speaking or slurred speech, []  temporary loss of vision in one eye, []  dizziness  HEMATOLOGIC:  []  bleeding problems, []  problems with blood clotting too easily  MUSCULOSKEL:  []  joint pain, []  joint swelling  GASTROINTEST:  []  vomiting blood, []  blood in stool     GENITOURINARY:  []  burning with urination, []  blood in urine  PSYCHIATRIC:  []  history of major depression  INTEGUMENTARY:  []  rashes, []  ulcers  Physical Examination  Filed Vitals:   07/29/13 1545  BP: 127/98  Pulse: 72  Height: 5\' 7"  (1.702 m)  Weight: 226 lb (102.513 kg)  SpO2: 98%   Body mass index is 35.39 kg/(m^2).  General: A&O x 3, WD, obese  Pulmonary: Sym exp, good air movt, CTAB, no rales, rhonchi, & wheezing  Cardiac: RRR, Nl S1, S2, no Murmurs, rubs or gallops Vascular: Vessel Right Left  Radial Faintly Palpable Not Palpable  Ulnar Not Palpable Not Palpable  Brachial Palpable Palpable   Gastrointestinal: soft, NTND, -G/R, -  HSM, - masses, - CVAT B  Musculoskeletal: M/S 5/5 throughout , Extremities without  ischemic changes , palpable thrill in access in L BC AVF, + bruit in access, left hand somewhat cyanotic, CR <4 sec  Neurologic: Pain and light touch intact in extremities , Motor exam as listed above  Non-Invasive Vascular Imaging  Left arm arterial Duplex  (Date: 07/29/2013):   SCA: mono  Axillary: mono  Brach: mono  Radial: Bi  Ulnar: Bi/tri  Medical Decision Making  [name] is a 63 y.o. female who presents with chronic kidney disease stage V complicated with likely steal syndrome  The patient likely has inflow compromise as evident by the high PSV in the arterial system of the L arm  I discussed with the patient the nature of angiographic procedures, especially the limited patencies of any endovascular intervention.  The patient is aware of that the risks of an angiographic procedure include but are not limited to: bleeding, infection, access site complications, renal failure, embolization, rupture of vessel, dissection, possible need for emergent surgical intervention, possible need for surgical procedures to treat the patient's pathology, anaphylactic reaction to contrast, and stroke and death.    The patient is aware of the risks and agrees to proceed.  Adele Barthel, MD [facility] [place] Office: [phone] Pager: [phone]  07/29/2013, 4:07 PM

## 2013-08-03 NOTE — Progress Notes (Signed)
   Since coming out of the OR, the previous thrill in the [facility] AVF has disappeared.  Subsequently, I suspect that the fistula has thrombosed, as evident by a strong radial pulse now being present in the radial artery.  The patient is amendable to a thrombectomy of the brachiocephalic arteriovenous fistula and revision of the banding.  The same risks as previously are present.  I have discussed with the daughter and patient that she is again at risk of thrombosing the arteriovenous fistula with the repeat banding.    Adele Barthel, MD [facility] [place] Office: [phone] Pager: [phone]  08/03/2013, 1:31 PM

## 2013-08-03 NOTE — Transfer of Care (Signed)
Immediate Anesthesia Transfer of Care Note  Patient: [name]  Procedure(s) Performed: Procedure(s): THROMBECTOMY OF ARTERIOVENOUS FISTULA  AND REVISION OF  ARTERIOVENOUS FISTULA BANDING. (Left)  Patient Location: PACU  Anesthesia Type:MAC  Level of Consciousness: awake, alert  and oriented  Airway & Oxygen Therapy: Patient Spontanous Breathing  Post-op Assessment: Report given to PACU RN  Post vital signs: Reviewed and stable  Complications: No apparent anesthesia complications

## 2013-08-03 NOTE — Anesthesia Preprocedure Evaluation (Addendum)
Anesthesia Evaluation  Patient identified by MRN, date of birth, ID band Patient awake    Reviewed: Allergy & Precautions, H&P , NPO status , Patient's Chart, lab work & pertinent test results  History of Anesthesia Complications (+) PONV  Airway Mallampati: II  Neck ROM: full    Dental   Pulmonary COPD         Cardiovascular hypertension, + angina + CAD and + Past MI     Neuro/Psych  Headaches, Depression    GI/Hepatic hiatal hernia, GERD-  ,  Endo/Other  diabetes, Type 2Hypothyroidism obese  Renal/GU Renal InsufficiencyRenal disease     Musculoskeletal  (+) Arthritis -,   Abdominal   Peds  Hematology   Anesthesia Other Findings   Reproductive/Obstetrics                         Anesthesia Physical Anesthesia Plan  ASA: III  Anesthesia Plan: MAC   Post-op Pain Management:    Induction: Intravenous  Airway Management Planned: Simple Face Mask  Additional Equipment:   Intra-op Plan:   Post-operative Plan:   Informed Consent: I have reviewed the patients History and Physical, chart, labs and discussed the procedure including the risks, benefits and alternatives for the proposed anesthesia with the patient or authorized representative who has indicated his/her understanding and acceptance.     Plan Discussed with: CRNA, Anesthesiologist and Surgeon  Anesthesia Plan Comments:         Anesthesia Quick Evaluation

## 2013-08-03 NOTE — Op Note (Signed)
    OPERATIVE NOTE   PROCEDURE: 1. Banding of left brachiocephalic arteriovenous fistula    PRE-OPERATIVE DIAGNOSIS: left hand steal syndrome  POST-OPERATIVE DIAGNOSIS: same as above   SURGEON: Adele Barthel, MD  ANESTHESIA: local and MAC  ESTIMATED BLOOD LOSS: 30 cc  FINDING(S): 1. Distal fistula >6 mm in diameter 2. No dopplerable radial signal before banding: monophasic radial signal without augmentation after banding 3. Palpable thrill in brachiocephalic arteriovenous fistula    SPECIMEN(S):  none  INDICATIONS:   [name] is a 63 y.o. female with chronic kidney disease stage III-IV presents with left hand steal syndrome.  I brought her back to the angiosuite for a left arm fistulogram and arm angiogram.  Those studies were consistent with hemodynamically significant steal.   I did not think this patient was a good candidate for a DRIL so I recommended we attempt to band her left brachiocephalic arteriovenous fistula.  Risk, benefits, and alternatives to access surgery were discussed.  The patient is aware the risks include but are not limited to: bleeding, infection, continued steal syndrome, nerve damage, thrombosis of the fistula, need for additional procedures, death and stroke.  The patient agrees to proceed forward with the procedure.  DESCRIPTION: After obtaining full informed written consent, the patient was brought back to the operating room and placed supine upon the operating table.  The patient received IV antibiotics prior to induction.  After obtaining adequate anesthesia, the patient was prepped and draped in the standard fashion for: a left access procedure.  I dissected out the distal 4 cm of this left brachiocephalic arteriovenous fistula.  I checked distally for a radial signal and none was present.  I cut a 3 cm length from a 6 mm Goretex graft.  I split the graft longitudinally and wrapped it around the distal fistula.  I sewed the graft back together with a  running stitch of 5-0 Prolene, forming a 3-4 mm cuff around the fistula.  Distally there was a monophasic signal present now, where there was none previously.  I also had no further augmentation of the signal with compression of venous outflow.  I washed out the incision and controlled the bleeding with electrocautery.  I reapproximated the subcutaneous tissue with a running stitch of 3-0 Vicryl.  The skin was closed with a running subcuticular stitch of 4-0 Monocryl.  The skin was cleaned, dried, and reinforced with Dermabond.    COMPLICATIONS: none  CONDITION: stable  Adele Barthel, MD [facility] [place] Office: [phone] Pager: [phone]  08/03/2013, 1:03 PM

## 2013-08-03 NOTE — Anesthesia Preprocedure Evaluation (Signed)
Anesthesia Evaluation  Patient identified by MRN, date of birth, ID band Patient awake    Reviewed: Allergy & Precautions, H&P , NPO status , Patient's Chart, lab work & pertinent test results  History of Anesthesia Complications (+) PONV  Airway Mallampati: II  Neck ROM: full    Dental   Pulmonary COPD         Cardiovascular hypertension, + angina + CAD and + Past MI     Neuro/Psych  Headaches, Depression    GI/Hepatic hiatal hernia, GERD-  ,  Endo/Other  diabetes, Type 2Hypothyroidism obese  Renal/GU Renal InsufficiencyRenal disease     Musculoskeletal  (+) Arthritis -,   Abdominal   Peds  Hematology   Anesthesia Other Findings   Reproductive/Obstetrics                           Anesthesia Physical  Anesthesia Plan  ASA: III  Anesthesia Plan: MAC   Post-op Pain Management:    Induction: Intravenous  Airway Management Planned: Simple Face Mask  Additional Equipment:   Intra-op Plan:   Post-operative Plan:   Informed Consent: I have reviewed the patients History and Physical, chart, labs and discussed the procedure including the risks, benefits and alternatives for the proposed anesthesia with the patient or authorized representative who has indicated his/her understanding and acceptance.     Plan Discussed with: CRNA, Anesthesiologist and Surgeon  Anesthesia Plan Comments:         Anesthesia Quick Evaluation

## 2013-08-03 NOTE — Transfer of Care (Signed)
Immediate Anesthesia Transfer of Care Note  Patient: [name]  Procedure(s) Performed: Procedure(s): BANDING OF BRACHIOCEPHALIC ARTERIOVENOUS FISTULA (Left)  Patient Location: PACU  Anesthesia Type:MAC  Level of Consciousness: awake, alert  and oriented  Airway & Oxygen Therapy: Patient Spontanous Breathing  Post-op Assessment: Report given to PACU RN  Post vital signs: Reviewed and stable  Complications: No apparent anesthesia complications

## 2013-08-03 NOTE — Anesthesia Postprocedure Evaluation (Signed)
Anesthesia Post Note  Patient: [name]  Procedure(s) Performed: Procedure(s) (LRB): THROMBECTOMY OF ARTERIOVENOUS FISTULA  AND REVISION OF  ARTERIOVENOUS FISTULA BANDING. (Left)  Anesthesia type: MAC  Patient location: PACU  Post pain: Pain level controlled and Adequate analgesia  Post assessment: Post-op Vital signs reviewed, Patient's Cardiovascular Status Stable and Respiratory Function Stable  Last Vitals:  Filed Vitals:   08/03/13 1545  BP: 136/69  Pulse:   Temp:   Resp:     Post vital signs: Reviewed and stable  Level of consciousness: awake, alert  and oriented  Complications: No apparent anesthesia complications

## 2013-08-04 ENCOUNTER — Telehealth: Payer: Self-pay | Admitting: Vascular Surgery

## 2013-08-04 NOTE — Telephone Encounter (Addendum)
Message copied by Gena Fray on Thu Aug 04, 2013 11:57 AM ------      Message from: Peter Minium K      Created: Wed Aug 03, 2013  1:14 PM      Regarding: Schedule                   ----- Message -----         From: Conrad [place], MD         Sent: 08/03/2013   1:11 PM           To: [facility] [address]            MARKAILA PECHA      [id number]      1951/02/28            PROCEDURE:      Banding of left brachiocephalic arteriovenous fistula              Follow-up: 4 weeks       ------  08/04/13: spoke with pt, dpm

## 2013-08-05 ENCOUNTER — Encounter (HOSPITAL_COMMUNITY): Payer: Self-pay | Admitting: Vascular Surgery

## 2013-08-08 ENCOUNTER — Ambulatory Visit (INDEPENDENT_AMBULATORY_CARE_PROVIDER_SITE_OTHER): Payer: Medicare HMO | Admitting: Family Medicine

## 2013-08-08 VITALS — BP 119/62 | HR 64 | Temp 98.5°F | Ht 66.0 in | Wt 230.0 lb

## 2013-08-08 DIAGNOSIS — H6690 Otitis media, unspecified, unspecified ear: Secondary | ICD-10-CM

## 2013-08-08 MED ORDER — AMOXICILLIN 875 MG PO TABS: 875.0000 mg | ORAL_TABLET | Freq: Two times a day (BID) | ORAL | Status: DC

## 2013-08-08 MED ORDER — ANTIPYRINE-BENZOCAINE 5.4-1.4 % OT SOLN: 3.0000 [drp] | OTIC | Status: DC | PRN

## 2013-08-08 NOTE — Progress Notes (Signed)
   Subjective:    Patient ID: [name], female    DOB: 10/30/1950, 63 y.o.   MRN: [id number]  HPI This 63 y.o. female presents for evaluation of right ear pain.  Patient is having Right ear pain and decreased hearing acuity in the right ear.   Review of Systems C/o right ear discomfort and decreased hearing acuity. No chest pain, SOB, HA, dizziness, vision change, N/V, diarrhea, constipation, dysuria, urinary urgency or frequency, myalgias, arthralgias or rash.     Objective:   Physical Exam  Vital signs noted  Well developed well nourished female.  HEENT - Head atraumatic Normocephalic                Eyes - PERRLA, Conjuctiva - clear Sclera- Clear EOMI                Ears - EAC's Wnl TM's Wnl Gross Hearing WNL                Throat - oropharanx wnl Respiratory - Lungs CTA bilateral Cardiac - RRR S1 and S2 without murmur GI - Abdomen soft Nontender and bowel sounds active x 4 Extremities - No edema. Neuro - Grossly intact.      Assessment & Plan:  Otitis media - Plan: amoxicillin (AMOXIL) 875 MG tablet, antipyrine-benzocaine (AURALGAN) otic solution Push po fluids, rest, tylenol and motrin otc prn as directed for fever, arthralgias, and myalgias.  Follow up prn if sx's continue or persist.  Lysbeth Penner FNP

## 2013-08-24 ENCOUNTER — Other Ambulatory Visit: Payer: Self-pay | Admitting: Family Medicine

## 2013-09-01 ENCOUNTER — Telehealth: Payer: Self-pay

## 2013-09-01 ENCOUNTER — Encounter: Payer: Self-pay | Admitting: Vascular Surgery

## 2013-09-01 NOTE — Telephone Encounter (Signed)
Phone call from pt.  Stated she thinks her left arm is infected where she recently had surgery.  Reports increased pain @ left upper arm incision.  Stated it is red and draining pus.  Denies fever/ chills.  Offered appt. with nurse practitioner today, @ 2:20 PM.  Stated she cannot come to office today, due to daughter having an appt. this afternoon.  Requested appt. in the AM.  Appt. given for 9:40 AM with nurse practitioner.  Agrees w/ plan.

## 2013-09-01 NOTE — Telephone Encounter (Signed)
Patient was already scheduled to see [place] at 9:45am on 06/12- will keep with [place] as per Arbie Cookey. dpm

## 2013-09-02 ENCOUNTER — Encounter: Payer: Self-pay | Admitting: Vascular Surgery

## 2013-09-02 ENCOUNTER — Ambulatory Visit (INDEPENDENT_AMBULATORY_CARE_PROVIDER_SITE_OTHER): Payer: Medicare PPO | Admitting: Vascular Surgery

## 2013-09-02 VITALS — BP 131/62 | HR 75 | Resp 16 | Ht 67.0 in | Wt 222.8 lb

## 2013-09-02 DIAGNOSIS — I770 Arteriovenous fistula, acquired: Secondary | ICD-10-CM

## 2013-09-02 NOTE — Progress Notes (Signed)
    Postoperative Access Visit   History of Present Illness  [name] is a 63 y.o. year old female who presents for postoperative follow-up for: L BC AVF banding which resulted in thrombosis of the fistula -> TE and plication of left BC AVF (Date: 08/03/13).  The patient's wounds are nearly healed.  The patient notes no steal symptoms.  The patient is able to complete their activities of daily living.  The patient's current symptoms are: mild amount of drainage from incision.  For VQI Use Only  PRE-ADM LIVING: Home  AMB STATUS: Ambulatory  Physical Examination Filed Vitals:   09/02/13 0848  BP: 131/62  Pulse: 75  Resp: 16    LUE: Incision is nearly healed, evidence of some stitch spitting, skin feels warm, hand grip is 5/5, sensation in digits is intact, palpable thrill, bruit can be auscultated   Medical Decision Making  [name] is a 63 y.o. year old female who presents s/p L BC AVF plication and failed banding.  The patient's access is ready for use.  Thank you for allowing [place] to participate in this patient's care.  Adele Barthel, MD [facility] Office: [phone] Pager: [phone]  09/02/2013, 9:00 AM

## 2013-09-12 ENCOUNTER — Telehealth: Payer: Self-pay

## 2013-09-12 ENCOUNTER — Ambulatory Visit (HOSPITAL_COMMUNITY)
Admission: RE | Admit: 2013-09-12 | Discharge: 2013-09-12 | Disposition: A | Discharge status: Home / Self Care | Payer: Medicare PPO | Source: Ambulatory Visit | Attending: Family | Admitting: Family

## 2013-09-12 ENCOUNTER — Ambulatory Visit (INDEPENDENT_AMBULATORY_CARE_PROVIDER_SITE_OTHER): Payer: Medicare PPO | Admitting: Family

## 2013-09-12 ENCOUNTER — Encounter: Payer: Self-pay | Admitting: Family

## 2013-09-12 VITALS — BP 116/66 | HR 72 | Temp 97.2°F | Resp 16 | Ht 67.0 in | Wt 224.0 lb

## 2013-09-12 DIAGNOSIS — T82898A Other specified complication of vascular prosthetic devices, implants and grafts, initial encounter: Secondary | ICD-10-CM

## 2013-09-12 DIAGNOSIS — R238 Other skin changes: Secondary | ICD-10-CM

## 2013-09-12 DIAGNOSIS — R252 Cramp and spasm: Secondary | ICD-10-CM | POA: Insufficient documentation

## 2013-09-12 DIAGNOSIS — R209 Unspecified disturbances of skin sensation: Secondary | ICD-10-CM | POA: Insufficient documentation

## 2013-09-12 DIAGNOSIS — M79609 Pain in unspecified limb: Secondary | ICD-10-CM

## 2013-09-12 DIAGNOSIS — M79642 Pain in left hand: Secondary | ICD-10-CM | POA: Insufficient documentation

## 2013-09-12 NOTE — Telephone Encounter (Signed)
Phone call from pt.  Stated she has had intermittent temperature change and cramping in left hand x 2 days.  Reported her hand gets cold and cramps to the point she has to lift her fingers to release objects, such as shower head and telephone.  Stated these are similar symptoms she had prior to her last procedure.   Discussed with nurse practitioner.  Rec'd. verbal order for duplex of left arm AVF and appt. this afternoon.  Voice message left for pt. with appt. @ 2:00 PM today.

## 2013-09-12 NOTE — Progress Notes (Signed)
Established Dialysis Access  History of Present Illness  [name] is a 63 y.o. (Jan 02, 1951) female patient of Dr. Bridgett Larsson who is s/p L BC AVF banding which resulted in thrombosis of the fistula -> TE and plication of left BC AVF (Date: 08/03/13). She returns today for C/O Left Hand Cramps, cold sensation and pain, duration 3 days. Pt has trouble holding objects in left hand, fingers cramp and will not open. She is having same symptoms as she had prior to needing the TE and plication of left BC AVF (Date: 08/03/13). She is not needing HD yet.  She has not been exercising left hand. She has never smoked, her DM is uncontrolled. The patient's PMH, PSH, SH, FamHx, Med, and Allergies are unchanged from 07/29/13.  REVIEW OF SYSTEMS: See HPI for pertinent positives and negatives   PHYSICAL EXAMINATION:  Filed Vitals:   09/12/13 1517  BP: 116/66  Pulse: 72  Temp: 97.2 F (36.2 C)  TempSrc: Oral  Resp: 16  Height: 5\' 7"  (1.702 m)  Weight: 224 lb (101.606 kg)  SpO2: 93%   Body mass index is 35.08 kg/(m^2).  General: The patient appears their stated age, obese female.   HEENT:  No gross abnormalities Pulmonary: Respirations are non-labored Abdomen: Soft and non-tender. Musculoskeletal: There are no major deformities. 2+ bilateral pretibial pitting edema.   Neurologic: No focal weakness, hand grip is strong and equal. Skin: There are no ulcers or rashes noted, + petechia on forearms. Psychiatric: The patient has normal affect. Cardiovascular: There is a regular rate and rhythm without significant murmur appreciated. Left upper arm AVF has a palpable thrill and audible bruit.  Radial pulses: right is 2 + palpable; Left: 1+ palpable. Left ulnar pulse is 2+.  Non-Invasive Vascular Imaging  Left upper arm Access Duplex  (Date: 09/12/2013):  DIALYSIS FISTULA DUPLEX EVALUATION    INDICATION: Other complication due to renal dialysis access    PREVIOUS INTERVENTION(S): Left  brachiocephalic arteriovenous fistula on 07/29/12 with arteriovenous fistula thrombectomy on 08/03/13    DUPLEX EXAM:     RIGHT  LEFT    Peak Systolic Velocities (cm/s) Ratio LOCATION Peak Systolic Velocities (cm/s)  Ratio        Inflow Artery 376         Arterio-Venous Anastomosis 372     Diameter (cm) Depth (cm)  Outflow Vein  Diameter (cm) Depth (cm)       Prox. Brachium  Vein 106  0.88 1.7       Mid Brachium  Vein 124  1.02 0.47       Distal Brachium  Vein 236  1.3 0.45      Antecubital Fossa   Vein 850 2.3 0.59 0.54    Diameter (cm) Branch Location Diameter (cm)   Mid Brachium 0.53        ADDITIONAL FINDINGS:   Triphasic antegrade flow noted in the left distal radial artery with velocities of 94cm/s and 138cm/s at rest and with fistula compression, respectively.    Elevated velocity and a residual lumen of 3.75mm noted in the left antecubital fossa level outflow vein which appears to be due to partially occlusive thrombus versus a frozen valve leaflet.   No velocity ratio of greater than 3 or residual lumen of less than 66mm noted within the outflow vein or anastomosis.      IMPRESSION: Patent left arm arteriovenous fistula with findings noted above.       Medical Decision Making  Tamija Aherne  Bennis is a 63 y.o. female who presents s/p L BC AVF banding which resulted in thrombosis of the fistula -> TE and plication of left BC AVF (Date: 08/03/13). She has steal symptoms in her left hand, the same symptoms she has prior to needing the TE and plication of left BC AVF (Date: 08/03/13). The dialysis fistula duplex results as above. Her left radial pulse is palpable at 1+, left ulnar pulse is 2+ palpable.   Based on Dialysis Fistula Duplex Evaluation, and after Dr. Trula Slade spoke with and examined pt, this patient's permanent access options include: pumping exercises of left hand, return in 4 days to see Dr. Bridgett Larsson.   NICKEL, Sharmon Leyden, RN, MSN, FNP-C [facility]  [place] Office: [phone]  09/12/2013, 3:36 PM  Clinic MD: Trula Slade

## 2013-09-13 ENCOUNTER — Telehealth: Payer: Self-pay | Admitting: Family Medicine

## 2013-09-13 NOTE — Telephone Encounter (Signed)
[facility] patient

## 2013-09-15 ENCOUNTER — Encounter: Payer: Self-pay | Admitting: Vascular Surgery

## 2013-09-16 ENCOUNTER — Ambulatory Visit (HOSPITAL_COMMUNITY)
Admission: RE | Admit: 2013-09-16 | Discharge: 2013-09-16 | Disposition: A | Discharge status: Home / Self Care | Payer: Medicare PPO | Source: Ambulatory Visit | Attending: Vascular Surgery | Admitting: Vascular Surgery

## 2013-09-16 ENCOUNTER — Encounter: Payer: Self-pay | Admitting: Vascular Surgery

## 2013-09-16 ENCOUNTER — Ambulatory Visit (INDEPENDENT_AMBULATORY_CARE_PROVIDER_SITE_OTHER): Payer: Medicare PPO | Admitting: Vascular Surgery

## 2013-09-16 VITALS — BP 127/56 | HR 72 | Ht 67.0 in | Wt 223.0 lb

## 2013-09-16 DIAGNOSIS — Y841 Kidney dialysis as the cause of abnormal reaction of the patient, or of later complication, without mention of misadventure at the time of the procedure: Secondary | ICD-10-CM | POA: Insufficient documentation

## 2013-09-16 DIAGNOSIS — T82898A Other specified complication of vascular prosthetic devices, implants and grafts, initial encounter: Secondary | ICD-10-CM | POA: Insufficient documentation

## 2013-09-16 DIAGNOSIS — R200 Anesthesia of skin: Secondary | ICD-10-CM

## 2013-09-16 DIAGNOSIS — N186 End stage renal disease: Secondary | ICD-10-CM | POA: Insufficient documentation

## 2013-09-16 DIAGNOSIS — R209 Unspecified disturbances of skin sensation: Secondary | ICD-10-CM

## 2013-09-16 NOTE — Progress Notes (Signed)
   Postoperative Access Visit   History of Present Illness  [name] is a 63 y.o. year old female who presents for postoperative follow-up for: L BC AVF banding which resulted in thrombosis of the fistula -> TE and plication of left BC AVF (Date: 08/03/13). The patient's wounds are nearly healed. The patient notes intermittent paraesthesia in her fingers.  The patient is able to complete their activities of daily living.  Pt notes prior history of left carpal tunnel release and also left cervical pain.  For VQI Use Only   PRE-ADM LIVING: Home   AMB STATUS: Ambulatory   Physical Examination  Filed Vitals:   09/16/13 1013  BP: 127/56  Pulse: 72  Height: 5\' 7"  (1.702 m)  Weight: 223 lb (101.152 kg)  SpO2: 95%   LUE: Incision is healed, skin feels warm, hand grip is 5/5, sensation in digits is intact, palpable thrill, bruit can be auscultated , cap refill <2 sec, no cyanosis in fingers  L steal study: ulnar pressure augments: 47 mm Hg, with 114 mm Hg at rest  [name] is a 63 y.o. year old female who presents s/p L BC AVF plication and failed banding.  I doubt this patient's sx are due to steal.  At baseline, she has adequate perfusion of the hand via the ulnar artery.  Even with compression, augmentation is not a clinically significant magnitude. I suspect this patient has some residual neuropathy from either her carpal tunnel, cubital tunnel, or cervical compression. I have referred her to Dr. Amedeo Plenty (Hand) for surgery evaluation. Thank you for allowing [place] to participate in this patient's care.  Adele Barthel, MD [facility] Office: [phone] Pager: [phone]  09/16/2013, 7:20 PM

## 2013-09-21 ENCOUNTER — Ambulatory Visit (INDEPENDENT_AMBULATORY_CARE_PROVIDER_SITE_OTHER): Payer: Commercial Managed Care - HMO | Admitting: Cardiology

## 2013-09-21 ENCOUNTER — Encounter: Payer: Self-pay | Admitting: Cardiology

## 2013-09-21 VITALS — BP 132/68 | HR 68 | Ht 67.0 in | Wt 223.0 lb

## 2013-09-21 DIAGNOSIS — R0602 Shortness of breath: Secondary | ICD-10-CM

## 2013-09-21 DIAGNOSIS — I10 Essential (primary) hypertension: Secondary | ICD-10-CM

## 2013-09-21 NOTE — Patient Instructions (Signed)
Your physician wants you to follow-up in: 1 year You will receive a reminder letter in the mail two months in advance. If you don't receive a letter, please call our office to schedule the follow-up appointment.    Your physician recommends that you continue on your current medications as directed. Please refer to the Current Medication list given to you today.     Thank you for choosing [facility] !  

## 2013-09-21 NOTE — Progress Notes (Signed)
Clinical Summary [name] is a 63 y.o.female seen today for follow up of the following medical problems.   1. [place] with walking around house. No significant chest pain. Occas palpitations. Uses 2-3 pillows for several years now, no PND. Occas LE edema, better with increased lasix dose.  - June 2014 echo with normal systolic and diastolic function. Stress MPI with no ischemia  - 01/2013 PFTs showed severe restriction and moderate diffusion defect, concerning for possible interstitial process. She was referred to Dr Luan Pulling  2. HTN  - does not check at home  - compliant w/ meds   3. Hyperlipidemia  - compliant with pravastatin  - last panel 06/2013 TC 184 TG 504 HDL 28 LDL not able to calculate - she reports blood sugars typically 200-300s, elevated Hgb A1c.   4. CKD - not on HD yet, though access has been placed.   5. COPD - followed Dr Luan Pulling, breathing remains stable  Past Medical History  Diagnosis Date  . Chest pain 2003    ?h/o MI, syncope; 2003 stress nuclear-inferoseptal scar, EF-54%; 2007-MI ruled out, echocardiogram-normal LV size, wall thickness and function  . RLS (restless legs syndrome)     sleep study in 2010: No apnea; moderate periodic movements  . GERD (gastroesophageal reflux disease)     H/o upper GI bleed  . GI bleeding   . Obese   . Gout   . Hypothyroidism   . Low back pain   . Cellulitis     Neck  . Diabetes mellitus 1998    A1c-7.1 in 2010, 6.7 in 2013  . Hyperlipidemia     Fasting lipids 2013:158, 152, 35, 93  . Hypertension 2002  . Anemia   . Hyperkalemia   . Irregular heart beat   . PONV (postoperative nausea and vomiting)   . Myocardial infarction   . CAD (coronary artery disease)     dr. Lattie Haw ([facility])  . Depression     Insomnia  . Headache(784.0)   . Vitiligo   . H/O hiatal hernia   . Arthritis   . Cancer     HX SKIN CANCER  . Anginal pain     last cp few months ago.  Marland Kitchen Dysrhythmia     "skips a beat"  .  COPD (chronic obstructive pulmonary disease)   . Chronic kidney disease, stage III (moderate)     Creat-1.9 in 04/2008, 2.65 in 02/2010(noHD  . Renal insufficiency   . Neuropathy      Allergies  Allergen Reactions  . Latex Shortness Of Breath and Swelling    Facial swelling  . Vancomycin Shortness Of Breath  . Vancomycin Hcl Swelling    REACTION: facial swelling  . Quinapril Hcl Swelling and Rash    Arm swelling     Current Outpatient Prescriptions  Medication Sig Dispense Refill  . albuterol (PROAIR HFA) 108 (90 BASE) MCG/ACT inhaler Inhale 2 puffs into the lungs every 6 (six) hours as needed for wheezing or shortness of breath.  1 Inhaler  1  . allopurinol (ZYLOPRIM) 100 MG tablet Take 1 tablet (100 mg total) by mouth every morning.  30 tablet  11  . amLODipine (NORVASC) 5 MG tablet Take 1 tablet (5 mg total) by mouth daily.  30 tablet  5  . amoxicillin (AMOXIL) 875 MG tablet Take 1 tablet (875 mg total) by mouth 2 (two) times daily.  20 tablet  0  . antipyrine-benzocaine (AURALGAN) otic solution Place  3-4 drops into the right ear every 2 (two) hours as needed for ear pain.  10 mL  0  . carvedilol (COREG) 25 MG tablet Take 1 tablet (25 mg total) by mouth 2 (two) times daily with a meal.  60 tablet  6  . cephALEXin (KEFLEX) 500 MG capsule Take 500 mg by mouth 4 (four) times daily.      . citalopram (CELEXA) 40 MG tablet Take 40 mg by mouth daily.      . clobetasol (TEMOVATE) 0.05 % external solution Apply 1 application topically daily as needed. Applied to scalp for psoriasis      . ferrous sulfate 325 (65 FE) MG tablet Take 1 tablet (325 mg total) by mouth daily with breakfast.  30 tablet  5  . furosemide (LASIX) 40 MG tablet Take 1 tablet (40 mg total) by mouth 2 (two) times daily.  60 tablet  5  . gabapentin (NEURONTIN) 800 MG tablet Take 2,400 mg by mouth at bedtime.      . insulin aspart (NOVOLOG FLEXPEN) 100 UNIT/ML FlexPen Inject 45 Units into the skin 3 (three) times daily  with meals.      . Insulin Glargine (LANTUS SOLOSTAR) 100 UNIT/ML Solostar Pen Inject 45 Units into the skin 2 (two) times daily. 45 units in the morning and 45 units at night  30 mL  2  . LANTUS SOLOSTAR 100 UNIT/ML Solostar Pen INJECT 41 TO 48 UNITS SUBCUTANEOUSLY INTO THE SKIN TWICE DAILY 41 UNITS IN THE MORNING AND 43 UNITS AT NIGHT  15 pen  2  . levothyroxine (SYNTHROID, LEVOTHROID) 50 MCG tablet Take 1 tablet (50 mcg total) by mouth every evening.  30 tablet  2  . losartan (COZAAR) 50 MG tablet Take 50 mg by mouth daily.       . nitroGLYCERIN (NITROSTAT) 0.4 MG SL tablet Place 1 tablet (0.4 mg total) under the tongue every 5 (five) minutes as needed for chest pain.  50 tablet  0  . omeprazole (PRILOSEC) 20 MG capsule Take 20 mg by mouth daily.      Marland Kitchen oxyCODONE (ROXICODONE) 5 MG immediate release tablet Take 1 tablet (5 mg total) by mouth every 4 (four) hours as needed for moderate pain.  15 tablet  0  . potassium chloride (K-DUR) 10 MEQ tablet Take 10 mEq by mouth daily.       . pravastatin (PRAVACHOL) 40 MG tablet Take 40 mg by mouth at bedtime.      Marland Kitchen rOPINIRole (REQUIP) 1 MG tablet Take 1 tablet (1 mg total) by mouth at bedtime.  30 tablet  11  . sodium bicarbonate 650 MG tablet Take 650 mg by mouth 2 (two) times daily.      Marland Kitchen tiotropium (SPIRIVA HANDIHALER) 18 MCG inhalation capsule Place 1 capsule (18 mcg total) into inhaler and inhale every morning.  30 capsule  5  . Vitamin D, Ergocalciferol, (DRISDOL) 50000 UNITS CAPS capsule Take 50,000 Units by mouth every 7 (seven) days. Monday       No current facility-administered medications for this visit.     Past Surgical History  Procedure Laterality Date  . Appendectomy    . Carpal tunnel release  1992, 1993, 2011    Right in 2011; previously bilateral  . Tubal ligation  1976  . Partial hysterectomy  1985    Vaginal  . Shoulder surgery  1997    Left  . Incise and drain abcess  2010    Neck-MRSA  .  Colonoscopy  02/2002, 07/2009      few diverticula, otherwise negative  . Foot surgery  1984    Bilateral heel spurs  . Abdominal hysterectomy    . Shoulder surgery      RIGHT  ADJUSTED   . Av fistula placement Left 07/16/2012    Procedure: ARTERIOVENOUS (AV) FISTULA CREATION;  Surgeon: Mal Misty, MD;  Location: [facility];  Service: Vascular;  Laterality: Left;  . Av fistula placement Left 07/29/2012    Procedure: ARTERIOVENOUS (AV) FISTULA CREATION;  Surgeon: Mal Misty, MD;  Location: [facility];  Service: Vascular;  Laterality: Left;  Left Brachial-Cephalic Arteriovenous Fistula Creation  . Colonoscopy with esophagogastroduodenoscopy (egd) N/A 08/27/2012    RMR: Normal esophagus s/p 54 F dilator. Small hiatal hernia. Gastric polyp removed, oozing (hyperplastic), which could explain heme positive stool:Colonic diverticulosis  . Revison of arteriovenous fistula Left 08/03/2013    Procedure: BANDING OF BRACHIOCEPHALIC ARTERIOVENOUS FISTULA;  Surgeon: Conrad Milam, MD;  Location: [facility];  Service: Vascular;  Laterality: Left;  . Thrombectomy w/ embolectomy Left 08/03/2013    Procedure: THROMBECTOMY OF ARTERIOVENOUS FISTULA  AND REVISION OF  ARTERIOVENOUS FISTULA BANDING.;  Surgeon: Conrad Lily Lake, MD;  Location: [facility];  Service: Vascular;  Laterality: Left;     Allergies  Allergen Reactions  . Latex Shortness Of Breath and Swelling    Facial swelling  . Vancomycin Shortness Of Breath  . Vancomycin Hcl Swelling    REACTION: facial swelling  . Quinapril Hcl Swelling and Rash    Arm swelling      Family History  Problem Relation Age of Onset  . Emphysema Mother   . COPD Mother   . Colon cancer Father   . Diabetes Father   . Cancer Father     colon  . Hypertension Father   . Congestive Heart Failure Maternal Grandmother   . Cancer Paternal Grandmother      Social History Ms. Eckmann reports that she has never smoked. She has never used smokeless tobacco. Ms. Portela reports that she does not drink alcohol.   Review  of Systems CONSTITUTIONAL: No weight loss, fever, chills, weakness or fatigue.  HEENT: Eyes: No visual loss, blurred vision, double vision or yellow sclerae.No hearing loss, sneezing, congestion, runny nose or sore throat.  SKIN: No rash or itching.  CARDIOVASCULAR:per HPI RESPIRATORY: +SOB GASTROINTESTINAL: No anorexia, nausea, vomiting or diarrhea. No abdominal pain or blood.  GENITOURINARY: No burning on urination, no polyuria NEUROLOGICAL: No headache, dizziness, syncope, paralysis, ataxia, numbness or tingling in the extremities. No change in bowel or bladder control.  MUSCULOSKELETAL: No muscle, back pain, joint pain or stiffness.  LYMPHATICS: No enlarged nodes. No history of splenectomy.  PSYCHIATRIC: No history of depression or anxiety.  ENDOCRINOLOGIC: No reports of sweating, cold or heat intolerance. No polyuria or polydipsia.  Marland Kitchen   Physical Examination p 68 bp 132/68 Wt 223 lbs BMI 35 Gen: resting comfortably, no acute distress HEENT: no scleral icterus, pupils equal round and reactive, no palptable cervical adenopathy,  CV: RRR, no m/r/g, no JVD, no carotid bruits Resp: Clear to auscultation bilaterally GI: abdomen is soft, non-tender, non-distended, normal bowel sounds, no hepatosplenomegaly MSK: extremities are warm, no edema.  Skin: warm, no rash Neuro:  no focal deficits Psych: appropriate affect   Diagnostic Studies 09/03/12 Echo: LVEF 55-60%, mild LVH, normal diastolic function, PASP 50, trivial effusion, normal IVC, normal RV   09/06/12 MPI:  Scintigraphic Data: Acquisition notable for mild breast attenuation,  moderate subdiaphragmatic activity adjacent to the inferior myocardium and normal left ventricular size. On tomographic images reconstructed in standard planes, there was a small defect of borderline numeric significance in the mid and distal anteroseptal segment. By comparison to the resting portion of the study, no reversibility was apparent. The  gated reconstruction demonstrated normal regional and global LV systolic function as well as normal systolic accentuation of activity throughout. Estimated ejection fraction was 65%.  IMPRESSION: Negative combined low level exercise and pharmacologic stress nuclear myocardial study. Mild breast attenuation was noted without evidence for ischemia or infarction. There was no electrocardiographic evidence for myocardial hypoperfusion nor were abnormalities apparent by scintigraphic imaging. Other findings as noted.   01/2013 PFTs Severe restriction, moderate diffusion defect concerning for interstitial process    Assessment and Plan  1. DOE  - no clear cardiac etiology from workup including no evidence of significant systolic dysfunction, diastolic dysfunction, or ischemia.  - abnormal PFTs, she was referred to pulmonary and is being treated for COPD  2. HTN  - at goal, continue current meds   3. HL  - managed by her PCP, continue statin per there recommendations  - elevated trilgycerides, initial goal would be tighter DM control     F/u 1 year      Arnoldo Lenis, M.D., F.A.C.C.

## 2013-09-28 ENCOUNTER — Encounter: Payer: Self-pay | Admitting: Internal Medicine

## 2013-09-30 ENCOUNTER — Ambulatory Visit (INDEPENDENT_AMBULATORY_CARE_PROVIDER_SITE_OTHER): Payer: Commercial Managed Care - HMO | Admitting: Family Medicine

## 2013-09-30 ENCOUNTER — Encounter: Payer: Self-pay | Admitting: Family Medicine

## 2013-09-30 VITALS — BP 125/56 | HR 71 | Temp 99.9°F | Ht 67.0 in | Wt 220.0 lb

## 2013-09-30 DIAGNOSIS — D649 Anemia, unspecified: Secondary | ICD-10-CM

## 2013-09-30 DIAGNOSIS — E1149 Type 2 diabetes mellitus with other diabetic neurological complication: Secondary | ICD-10-CM

## 2013-09-30 DIAGNOSIS — R350 Frequency of micturition: Secondary | ICD-10-CM

## 2013-09-30 DIAGNOSIS — G609 Hereditary and idiopathic neuropathy, unspecified: Secondary | ICD-10-CM

## 2013-09-30 DIAGNOSIS — I10 Essential (primary) hypertension: Secondary | ICD-10-CM

## 2013-09-30 DIAGNOSIS — IMO0002 Reserved for concepts with insufficient information to code with codable children: Secondary | ICD-10-CM

## 2013-09-30 DIAGNOSIS — E1140 Type 2 diabetes mellitus with diabetic neuropathy, unspecified: Secondary | ICD-10-CM

## 2013-09-30 DIAGNOSIS — E1165 Type 2 diabetes mellitus with hyperglycemia: Secondary | ICD-10-CM

## 2013-09-30 DIAGNOSIS — M109 Gout, unspecified: Secondary | ICD-10-CM

## 2013-09-30 DIAGNOSIS — N184 Chronic kidney disease, stage 4 (severe): Secondary | ICD-10-CM

## 2013-09-30 DIAGNOSIS — E1142 Type 2 diabetes mellitus with diabetic polyneuropathy: Secondary | ICD-10-CM

## 2013-09-30 DIAGNOSIS — E559 Vitamin D deficiency, unspecified: Secondary | ICD-10-CM

## 2013-09-30 LAB — POCT UA - MICROSCOPIC ONLY
Bacteria, U Microscopic: NEGATIVE
CRYSTALS, UR, HPF, POC: NEGATIVE
Casts, Ur, LPF, POC: NEGATIVE
Mucus, UA: NEGATIVE
RBC, urine, microscopic: NEGATIVE
WBC, Ur, HPF, POC: NEGATIVE
YEAST UA: NEGATIVE

## 2013-09-30 LAB — POCT URINALYSIS DIPSTICK
Bilirubin, UA: NEGATIVE
GLUCOSE UA: NEGATIVE
Ketones, UA: NEGATIVE
Leukocytes, UA: NEGATIVE
NITRITE UA: NEGATIVE
Protein, UA: NEGATIVE
RBC UA: NEGATIVE
Spec Grav, UA: 1.01
Urobilinogen, UA: NEGATIVE
pH, UA: 5

## 2013-09-30 LAB — POCT UA - MICROALBUMIN: Microalbumin Ur, POC: 100 mg/L

## 2013-09-30 LAB — POCT GLYCOSYLATED HEMOGLOBIN (HGB A1C): Hemoglobin A1C: >14

## 2013-09-30 LAB — GLUCOSE, POCT (MANUAL RESULT ENTRY): POC GLUCOSE: 418 mg/dL — AB (ref 70–99)

## 2013-09-30 MED ORDER — POTASSIUM CHLORIDE CRYS ER 10 MEQ PO TBCR: 10.0000 meq | EXTENDED_RELEASE_TABLET | Freq: Two times a day (BID) | ORAL | Status: DC

## 2013-09-30 MED ORDER — OMEPRAZOLE 20 MG PO CPDR: 20.0000 mg | DELAYED_RELEASE_CAPSULE | Freq: Every day | ORAL | Status: DC

## 2013-09-30 MED ORDER — PRAVASTATIN SODIUM 40 MG PO TABS: 40.0000 mg | ORAL_TABLET | Freq: Every day | ORAL | Status: DC

## 2013-09-30 MED ORDER — NITROGLYCERIN 0.4 MG SL SUBL: 0.4000 mg | SUBLINGUAL_TABLET | SUBLINGUAL | Status: AC | PRN

## 2013-09-30 MED ORDER — HYDROCODONE-ACETAMINOPHEN 10-325 MG PO TABS: 1.0000 | ORAL_TABLET | Freq: Four times a day (QID) | ORAL | Status: DC | PRN

## 2013-09-30 MED ORDER — GLUCOSE BLOOD VI STRP: 1.0000 | ORAL_STRIP | Freq: Three times a day (TID) | Status: DC

## 2013-09-30 NOTE — Progress Notes (Addendum)
Subjective:    Patient ID: [name], female    DOB: February 11, 1951, 63 y.o.   MRN: [id number]  HPI Routine follow up.  Followed by cardiology, nephrology, and pulmonary.  Thinks sugars are elevated since she has not monitored recently    Review of Systems  Constitutional: Positive for fatigue.  HENT: Negative.   Eyes: Positive for visual disturbance.  Respiratory: Positive for wheezing.   Cardiovascular: Negative.   Gastrointestinal: Negative.   Genitourinary: Negative.   Skin: Negative.   Allergic/Immunologic: Negative.   Neurological: Negative.   Hematological: Negative.   Psychiatric/Behavioral: Negative.        Objective:   Physical Exam  Constitutional: She is oriented to person, place, and time. She appears well-developed and well-nourished.  HENT:  Head: Normocephalic and atraumatic.  Eyes: Pupils are equal, round, and reactive to light.  Neck: Normal range of motion.  Cardiovascular: Normal rate and normal heart sounds.   Abdominal: Soft.  Neurological: She is alert and oriented to person, place, and time.  Skin: Skin is warm.  Psychiatric: She has a normal mood and affect. Her behavior is normal. Judgment and thought content normal.   Results for orders placed in visit on 09/30/13  POCT UA - MICROALBUMIN      Result Value Ref Range   Microalbumin Ur, POC 100    POCT GLYCOSYLATED HEMOGLOBIN (HGB A1C)      Result Value Ref Range   Hemoglobin A1C >14.0    GLUCOSE, POCT (MANUAL RESULT ENTRY)      Result Value Ref Range   POC Glucose 418 (*) 70 - 99 mg/dl  POCT UA - MICROSCOPIC ONLY      Result Value Ref Range   WBC, Ur, HPF, POC neg     RBC, urine, microscopic neg     Bacteria, U Microscopic neg     Mucus, UA neg     Epithelial cells, urine per micros occ     Crystals, Ur, HPF, POC neg     Casts, Ur, LPF, POC neg     Yeast, UA neg    POCT URINALYSIS DIPSTICK      Result Value Ref Range   Color, UA yellow     Clarity, UA clear     Glucose, UA neg       Bilirubin, UA neg     Ketones, UA neg     Spec Grav, UA 1.010     Blood, UA neg     pH, UA 5.0     Protein, UA neg     Urobilinogen, UA negative     Nitrite, UA neg     Leukocytes, UA Negative           Assessment & Plan:  Essential hypertension - Plan: MM Digital Screening  DM type 2, uncontrolled, with neuropathy - Plan: POCT UA - Microalbumin, POCT glycosylated hemoglobin (Hb A1C), CMP14+EGFR, POCT glucose (manual entry), glucose blood (ACCU-CHEK AVIVA) test strip, MM Digital Screening  Chronic kidney disease, stage IV (severe) - Plan: MM Digital Screening  Anemia, normocytic normochromic - Plan: CBC With differential/Platelet, MM Digital Screening  Gout of knee, unspecified cause, unspecified chronicity, unspecified laterality - Plan: Uric acid, MM Digital Screening  Urinary frequency - Plan: POCT UA - Microscopic Only, POCT urinalysis dipstick, MM Digital Screening  Vitamin D deficiency - Plan: Vit D  25 hydroxy (rtn osteoporosis monitoring), MM Digital Screening  Unspecified hereditary and idiopathic peripheral neuropathy - Plan: MM Digital Screening Add hydrocodone  5 mg Wardell Honour MD

## 2013-10-02 LAB — CBC WITH DIFFERENTIAL
BASOS ABS: 0 10*3/uL (ref 0.0–0.2)
Basos: 0 %
EOS ABS: 0.3 10*3/uL (ref 0.0–0.4)
Eos: 3 %
HCT: 38.5 % (ref 34.0–46.6)
Hemoglobin: 12.8 g/dL (ref 11.1–15.9)
IMMATURE GRANULOCYTES: 0 %
Immature Grans (Abs): 0 10*3/uL (ref 0.0–0.1)
LYMPHS ABS: 1.9 10*3/uL (ref 0.7–3.1)
Lymphs: 19 %
MCH: 28.5 pg (ref 26.6–33.0)
MCHC: 33.2 g/dL (ref 31.5–35.7)
MCV: 86 fL (ref 79–97)
MONOCYTES: 7 %
Monocytes Absolute: 0.7 10*3/uL (ref 0.1–0.9)
Neutrophils Absolute: 6.9 10*3/uL (ref 1.4–7.0)
Neutrophils Relative %: 71 %
PLATELETS: 329 10*3/uL (ref 150–379)
RBC: 4.49 x10E6/uL (ref 3.77–5.28)
RDW: 14.9 % (ref 12.3–15.4)
WBC: 9.9 10*3/uL (ref 3.4–10.8)

## 2013-10-02 LAB — URIC ACID: Uric Acid: 4.7 mg/dL (ref 2.5–7.1)

## 2013-10-02 LAB — CMP14+EGFR
ALK PHOS: 162 IU/L — AB (ref 39–117)
ALT: 10 IU/L (ref 0–32)
AST: 8 IU/L (ref 0–40)
Albumin/Globulin Ratio: 1.3 (ref 1.1–2.5)
Albumin: 4 g/dL (ref 3.6–4.8)
BILIRUBIN TOTAL: 0.2 mg/dL (ref 0.0–1.2)
BUN/Creatinine Ratio: 16 (ref 11–26)
BUN: 25 mg/dL (ref 8–27)
CO2: 21 mmol/L (ref 18–29)
Calcium: 9.3 mg/dL (ref 8.7–10.3)
Chloride: 90 mmol/L — ABNORMAL LOW (ref 97–108)
Creatinine, Ser: 1.54 mg/dL — ABNORMAL HIGH (ref 0.57–1.00)
GFR calc Af Amer: 41 mL/min/{1.73_m2} — ABNORMAL LOW (ref >59)
GFR calc non Af Amer: 35 mL/min/{1.73_m2} — ABNORMAL LOW (ref >59)
Globulin, Total: 3.2 g/dL (ref 1.5–4.5)
Glucose: 451 mg/dL (ref 65–99)
POTASSIUM: 3.4 mmol/L — AB (ref 3.5–5.2)
Sodium: 132 mmol/L — ABNORMAL LOW (ref 134–144)
Total Protein: 7.2 g/dL (ref 6.0–8.5)

## 2013-10-02 LAB — VITAMIN D 25 HYDROXY (VIT D DEFICIENCY, FRACTURES): Vit D, 25-Hydroxy: 18.4 ng/mL — ABNORMAL LOW (ref 30.0–100.0)

## 2013-10-04 ENCOUNTER — Telehealth: Payer: Self-pay | Admitting: Family Medicine

## 2013-10-04 LAB — VITAMIN D 25 HYDROXY (VIT D DEFICIENCY, FRACTURES)

## 2013-10-06 ENCOUNTER — Ambulatory Visit (INDEPENDENT_AMBULATORY_CARE_PROVIDER_SITE_OTHER): Payer: Commercial Managed Care - HMO | Admitting: Pharmacist

## 2013-10-06 ENCOUNTER — Encounter: Payer: Self-pay | Admitting: Pharmacist

## 2013-10-06 VITALS — BP 138/70 | HR 70 | Ht 67.0 in | Wt 221.0 lb

## 2013-10-06 DIAGNOSIS — E1149 Type 2 diabetes mellitus with other diabetic neurological complication: Secondary | ICD-10-CM | POA: Diagnosis not present

## 2013-10-06 DIAGNOSIS — IMO0002 Reserved for concepts with insufficient information to code with codable children: Secondary | ICD-10-CM

## 2013-10-06 DIAGNOSIS — E785 Hyperlipidemia, unspecified: Secondary | ICD-10-CM

## 2013-10-06 DIAGNOSIS — E1140 Type 2 diabetes mellitus with diabetic neuropathy, unspecified: Secondary | ICD-10-CM

## 2013-10-06 DIAGNOSIS — E669 Obesity, unspecified: Secondary | ICD-10-CM

## 2013-10-06 DIAGNOSIS — E1165 Type 2 diabetes mellitus with hyperglycemia: Principal | ICD-10-CM

## 2013-10-06 DIAGNOSIS — E1142 Type 2 diabetes mellitus with diabetic polyneuropathy: Secondary | ICD-10-CM

## 2013-10-06 DIAGNOSIS — N184 Chronic kidney disease, stage 4 (severe): Secondary | ICD-10-CM

## 2013-10-06 MED ORDER — INSULIN PEN NEEDLE 31G X 5 MM MISC: Status: DC

## 2013-10-06 NOTE — Progress Notes (Signed)
Subjective:    [name] is a 63 y.o. female who presents for an initial evaluation of Type 2 diabetes mellitus.  Current symptoms/problems include hyperglycemia and increase appetite and have been worsening. Symptoms have been present for several months.  The patient was initially diagnosed with Type 2 diabetes mellitus in 1992  Known diabetic complications: nephropathy and peripheral neuropathy Cardiovascular risk factors: advanced age (older than 39 for men, 44 for women), diabetes mellitus, dyslipidemia, family history of premature cardiovascular disease, obesity (BMI >= 30 kg/m2) and sedentary lifestyle Current diabetic medications include Lantus 45 units BID (forgets second dose about 2-3 times per week) and Novolog 45 units TID prior to meals (forgets pm dose about 2-3 times weekly).  Patient's serum creatinine and histort of CKD prevents use of metformin at this time.  Current diet: in general, an "unhealthy" diet - drinks Pepsi regularly (about 2 liter per day) waffles in am, she has CHO counting educations about 5 years ago but has forgotten quite a bit of information since then Current exercise: none  Current monitoring regimen: home blood tests - 3 times daily Home blood sugar records: per patient ranges from 200 to 400 Any episodes of hypoglycemia? yes - about 2 months ago BG dropped to 38 during night and required EMS   Is She on ACE inhibitor or angiotensin II receptor blocker?  Yes    losartan (Cozaar)  The following portions of the patient's history were reviewed and updated as appropriate: allergies, current medications, past family history, past medical history, past social history, past surgical history and problem list.   Objective:    BP 138/70  Pulse 70  Ht 5\' 7"  (1.702 m)  Wt 221 lb (100.245 kg)  BMI 34.61 kg/m2   Lab Review Glucose (mg/dL)  Date Value  09/30/2013 451*  04/01/2013 66   03/15/2013 347*     Glucose, Bld (mg/dL)  Date Value  08/03/2013  146*  08/01/2013 132*  07/27/2013 653*     CO2 (mmol/L)  Date Value  09/30/2013 21   07/27/2013 24   04/01/2013 25      BUN (mg/dL)  Date Value  09/30/2013 25   08/01/2013 27*  07/27/2013 23   04/01/2013 29*  03/15/2013 42*  09/30/2012 27*     Creat (mg/dL)  Date Value  09/30/2012 1.84*  09/03/2012 1.94*  07/26/2012 2.54*     Creatinine (mg/dL)  Date Value  05/15/2008 1.81*     Creatinine, Ser (mg/dL)  Date Value  09/30/2013 1.54*  08/01/2013 1.80*  07/27/2013 1.62*  A1c was greater than 14% (09/30/2013)  Assessment:    Diabetes Mellitus type II, under poor control - medication choices complicated by nephropathy / CKD CKD Obesity Hyperlipidemia - elevated Tg mosly like related to unctonrolled Type 2 DM.    Plan:    1.  Rx changes: change Lantus to 60 unit qam and 35 unit qpm;  add tradgenta 5mg  daily;  continue novolog 45 units tid prior to meals 2.  Education: Reviewed 'ABCs' of diabetes management (respective goals in parentheses):  A1C (<7), blood pressure (<130/80), and cholesterol (LDL <100). 3.  Compliance at present is estimated to be poor. Efforts to improve compliance (if necessary) will be directed at dietary modifications: discussed CHO counting in depth.   Serving sizes discussed and reviewed how to read nutrition labels.   and regular blood sugar monitoring: 3-4 times daily. 4.  Reviewed s/s hypoglycemia and proper treatment 5. Follow up: 2 weeks

## 2013-10-06 NOTE — Telephone Encounter (Signed)
[facility] to Referrals [phone]  Pt can call [name] and schedule her own appointment as long as she is not having any problems. Their number is [phone]

## 2013-10-06 NOTE — Telephone Encounter (Signed)
Pt given number to call [name] to schedule mammo appointment

## 2013-10-06 NOTE — Patient Instructions (Signed)
Change Lantus to 60 units each morning and 35 units each evening Continue Novolog 45 units prior to meals Start tradjenta 5 mg  - take 1 tablet each morning.    Diabetes and Standards of Medical Care  Diabetes is complicated. You may find that your diabetes team includes a dietitian, nurse, diabetes educator, eye doctor, and more. To help everyone know what is going on and to help you get the care you deserve, the following schedule of care was developed to help keep you on track. Below are the tests, exams, vaccines, medicines, education, and plans you will need.  Blood Glucose Goals Prior to meals = 80 - 130 Within 2 hours of the start of a meal = less than 180  HbA1c test (goal is less than 7.0% - your last value was over 14%) This test shows how well you have controlled your glucose over the past 2 3 months. It is used to see if your diabetes management plan needs to be adjusted.   It is performed at least 2 times a year if you are meeting treatment goals.  It is performed 4 times a year if therapy has changed or if you are not meeting treatment goals.   Blood pressure test  This test is performed at every routine medical visit. The goal is less than 140/90 mmHg for most people, but 130/80 mmHg in some cases. Ask your health care provider about your goal. Dental exam  Follow up with the dentist regularly. Eye exam  If you are diagnosed with type 1 diabetes as a child, get an exam upon reaching the age of 82 years or older and have had diabetes for 3 5 years. Yearly eye exams are recommended after that initial eye exam.  If you are diagnosed with type 1 diabetes as an adult, get an exam within 5 years of diagnosis and then yearly.  If you are diagnosed with type 2 diabetes, get an exam as soon as possible after the diagnosis and then yearly. Foot care exam  Visual foot exams are performed at every routine medical visit. The exams check for cuts, injuries, or other problems with  the feet.  A comprehensive foot exam should be done yearly. This includes visual inspection as well as assessing foot pulses and testing for loss of sensation.  Check your feet nightly for cuts, injuries, or other problems with your feet. Tell your health care provider if anything is not healing. Kidney function test (urine microalbumin)  This test is performed once a year.  Type 1 diabetes: The first test is performed 5 years after diagnosis.  Type 2 diabetes: The first test is performed at the time of diagnosis.  A serum creatinine and estimated glomerular filtration rate (eGFR) test is done once a year to assess the level of chronic kidney disease (CKD), if present. Lipid profile (cholesterol, HDL, LDL, triglycerides)  Performed every 5 years for most people.  The goal for LDL is less than 100 mg/dL. If you are at high risk, the goal is less than 70 mg/dL.  The goal for HDL is 40 mg/dL 50 mg/dL for men and 50 mg/dL 60 mg/dL for women. An HDL cholesterol of 60 mg/dL or higher gives some protection against heart disease.  The goal for triglycerides is less than 150 mg/dL. Influenza vaccine, pneumococcal vaccine, and hepatitis B vaccine  The influenza vaccine is recommended yearly.  The pneumococcal vaccine is generally given once in a lifetime. However, there are some instances  when another vaccination is recommended. Check with your health care provider.  The hepatitis B vaccine is also recommended for adults with diabetes. Diabetes self-management education  Education is recommended at diagnosis and ongoing as needed. Treatment plan  Your treatment plan is reviewed at every medical visit. Document Released: 01/05/2009 Document Revised: 11/10/2012 Document Reviewed: 08/10/2012 [facility] Patient Information 2014 [place].   Hypoglycemia Hypoglycemia occurs when the glucose in your blood is too low. Glucose is a type of sugar that is your body's main energy source.  Hormones, such as insulin and glucagon, control the level of glucose in the blood. Insulin lowers blood glucose and glucagon increases blood glucose. Having too much insulin in your blood stream, or not eating enough food containing sugar, can result in hypoglycemia. Hypoglycemia can happen to people with or without diabetes. It can develop quickly and can be a medical emergency.  CAUSES   Missing or delaying meals.  Not eating enough carbohydrates at meals.  Taking too much diabetes medicine.  Not timing your oral diabetes medicine or insulin doses with meals, snacks, and exercise.  Nausea and vomiting.  Certain medicines.  Severe illnesses, such as hepatitis, kidney disorders, and certain eating disorders.  Increased activity or exercise without eating something extra or adjusting medicines.  Drinking too much alcohol.  A nerve disorder that affects body functions like your heart rate, blood pressure, and digestion (autonomic neuropathy).  A condition where the stomach muscles do not function properly (gastroparesis). Therefore, medicines and food may not absorb properly.  Rarely, a tumor of the pancreas can produce too much insulin. SYMPTOMS   Hunger.  Sweating (diaphoresis).  Change in body temperature.  Shakiness.  Headache.  Anxiety.  Lightheadedness.  Irritability.  Difficulty concentrating.  Dry mouth.  Tingling or numbness in the hands or feet.  Restless sleep or sleep disturbances.  Altered speech and coordination.  Change in mental status.  Seizures or prolonged convulsions.  Combativeness.  Drowsiness (lethargic).  Weakness.  Increased heart rate or palpitations.  Confusion.  Pale, gray skin color.  Blurred or double vision.  Fainting. DIAGNOSIS  A physical exam and medical history will be performed. Your caregiver may make a diagnosis based on your symptoms. Blood tests and other lab tests may be performed to confirm a diagnosis.  Once the diagnosis is made, your caregiver will see if your signs and symptoms go away once your blood glucose is raised.  TREATMENT  Usually, you can easily treat your hypoglycemia when you notice symptoms.  Check your blood glucose. If it is less than 70 mg/dl, take one of the following:   3-4 glucose tablets.    cup juice.    cup regular soda.   1 cup skim milk.   -1 tube of glucose gel.   5-6 hard candies.   Avoid high-fat drinks or food that may delay a rise in blood glucose levels.  Do not take more than the recommended amount of sugary foods, drinks, gel, or tablets. Doing so will cause your blood glucose to go too high.   Wait 10-15 minutes and recheck your blood glucose. If it is still less than 70 mg/dl or below your target range, repeat treatment.   Eat a snack if it is more than 1 hour until your next meal.  There may be a time when your blood glucose may go so low that you are unable to treat yourself at home when you start to notice symptoms. You may need someone to help  you. You may even faint or be unable to swallow. If you cannot treat yourself, someone will need to bring you to the hospital.  [place]  If you have diabetes, follow your diabetes management plan by:  Taking your medicines as directed.  Following your exercise plan.  Following your meal plan. Do not skip meals. Eat on time.  Testing your blood glucose regularly. Check your blood glucose before and after exercise. If you exercise longer or different than usual, be sure to check blood glucose more frequently.  Wearing your medical alert jewelry that says you have diabetes.  Identify the cause of your hypoglycemia. Then, develop ways to prevent the recurrence of hypoglycemia.  Do not take a hot bath or shower right after an insulin shot.  Always carry treatment with you. Glucose tablets are the easiest to carry.  If you are going to drink alcohol, drink it only with  meals.  Tell friends or family members ways to keep you safe during a seizure. This may include removing hard or sharp objects from the area or turning you on your side.  Maintain a healthy weight. SEEK MEDICAL CARE IF:   You are having problems keeping your blood glucose in your target range.  You are having frequent episodes of hypoglycemia.  You feel you might be having side effects from your medicines.  You are not sure why your blood glucose is dropping so low.  You notice a change in vision or a new problem with your vision. SEEK IMMEDIATE MEDICAL CARE IF:   Confusion develops.  A change in mental status occurs.  The inability to swallow develops.  Fainting occurs. Document Released: 03/10/2005 Document Revised: 03/15/2013 Document Reviewed: 07/07/2011 [facility] Patient Information 2015 [place], [place]. This information is not intended to replace advice given to you by your health care provider. Make sure you discuss any questions you have with your health care provider.

## 2013-10-12 ENCOUNTER — Other Ambulatory Visit: Payer: Self-pay | Admitting: Family Medicine

## 2013-10-12 DIAGNOSIS — IMO0002 Reserved for concepts with insufficient information to code with codable children: Secondary | ICD-10-CM

## 2013-10-12 DIAGNOSIS — N644 Mastodynia: Secondary | ICD-10-CM

## 2013-10-12 DIAGNOSIS — R229 Localized swelling, mass and lump, unspecified: Secondary | ICD-10-CM

## 2013-10-18 ENCOUNTER — Ambulatory Visit (HOSPITAL_COMMUNITY)
Admission: RE | Admit: 2013-10-18 | Discharge: 2013-10-18 | Disposition: A | Discharge status: Home / Self Care | Payer: Medicare HMO | Source: Ambulatory Visit | Attending: Family Medicine | Admitting: Family Medicine

## 2013-10-18 DIAGNOSIS — N644 Mastodynia: Secondary | ICD-10-CM | POA: Insufficient documentation

## 2013-10-18 DIAGNOSIS — R229 Localized swelling, mass and lump, unspecified: Secondary | ICD-10-CM | POA: Diagnosis not present

## 2013-10-18 DIAGNOSIS — IMO0002 Reserved for concepts with insufficient information to code with codable children: Secondary | ICD-10-CM

## 2013-10-19 ENCOUNTER — Telehealth: Payer: Self-pay | Admitting: *Deleted

## 2013-10-19 NOTE — Telephone Encounter (Signed)
done

## 2013-10-22 ENCOUNTER — Encounter (HOSPITAL_COMMUNITY): Payer: Self-pay | Admitting: Emergency Medicine

## 2013-10-22 ENCOUNTER — Inpatient Hospital Stay (HOSPITAL_COMMUNITY)
Admission: EM | Admit: 2013-10-22 | Discharge: 2013-10-25 | DRG: 392 | Disposition: A | Discharge status: Home / Self Care | Payer: Medicare HMO | Attending: Internal Medicine | Admitting: Internal Medicine

## 2013-10-22 ENCOUNTER — Emergency Department (HOSPITAL_COMMUNITY): Payer: Medicare HMO

## 2013-10-22 DIAGNOSIS — R1011 Right upper quadrant pain: Secondary | ICD-10-CM | POA: Diagnosis present

## 2013-10-22 DIAGNOSIS — M7989 Other specified soft tissue disorders: Secondary | ICD-10-CM | POA: Diagnosis present

## 2013-10-22 DIAGNOSIS — Z8249 Family history of ischemic heart disease and other diseases of the circulatory system: Secondary | ICD-10-CM | POA: Diagnosis not present

## 2013-10-22 DIAGNOSIS — F329 Major depressive disorder, single episode, unspecified: Secondary | ICD-10-CM | POA: Diagnosis present

## 2013-10-22 DIAGNOSIS — R739 Hyperglycemia, unspecified: Secondary | ICD-10-CM

## 2013-10-22 DIAGNOSIS — D649 Anemia, unspecified: Secondary | ICD-10-CM

## 2013-10-22 DIAGNOSIS — I129 Hypertensive chronic kidney disease with stage 1 through stage 4 chronic kidney disease, or unspecified chronic kidney disease: Secondary | ICD-10-CM | POA: Diagnosis present

## 2013-10-22 DIAGNOSIS — G2581 Restless legs syndrome: Secondary | ICD-10-CM | POA: Diagnosis present

## 2013-10-22 DIAGNOSIS — N186 End stage renal disease: Secondary | ICD-10-CM

## 2013-10-22 DIAGNOSIS — K819 Cholecystitis, unspecified: Secondary | ICD-10-CM

## 2013-10-22 DIAGNOSIS — IMO0002 Reserved for concepts with insufficient information to code with codable children: Secondary | ICD-10-CM | POA: Diagnosis present

## 2013-10-22 DIAGNOSIS — I2510 Atherosclerotic heart disease of native coronary artery without angina pectoris: Secondary | ICD-10-CM | POA: Diagnosis present

## 2013-10-22 DIAGNOSIS — E1140 Type 2 diabetes mellitus with diabetic neuropathy, unspecified: Secondary | ICD-10-CM

## 2013-10-22 DIAGNOSIS — G4700 Insomnia, unspecified: Secondary | ICD-10-CM | POA: Diagnosis present

## 2013-10-22 DIAGNOSIS — R209 Unspecified disturbances of skin sensation: Secondary | ICD-10-CM

## 2013-10-22 DIAGNOSIS — Z9089 Acquired absence of other organs: Secondary | ICD-10-CM

## 2013-10-22 DIAGNOSIS — K219 Gastro-esophageal reflux disease without esophagitis: Secondary | ICD-10-CM | POA: Diagnosis present

## 2013-10-22 DIAGNOSIS — J449 Chronic obstructive pulmonary disease, unspecified: Secondary | ICD-10-CM | POA: Diagnosis present

## 2013-10-22 DIAGNOSIS — E669 Obesity, unspecified: Secondary | ICD-10-CM

## 2013-10-22 DIAGNOSIS — E785 Hyperlipidemia, unspecified: Secondary | ICD-10-CM

## 2013-10-22 DIAGNOSIS — Z8614 Personal history of Methicillin resistant Staphylococcus aureus infection: Secondary | ICD-10-CM | POA: Diagnosis not present

## 2013-10-22 DIAGNOSIS — M109 Gout, unspecified: Secondary | ICD-10-CM

## 2013-10-22 DIAGNOSIS — Z91040 Latex allergy status: Secondary | ICD-10-CM | POA: Diagnosis not present

## 2013-10-22 DIAGNOSIS — N184 Chronic kidney disease, stage 4 (severe): Secondary | ICD-10-CM

## 2013-10-22 DIAGNOSIS — E039 Hypothyroidism, unspecified: Secondary | ICD-10-CM | POA: Diagnosis present

## 2013-10-22 DIAGNOSIS — E1149 Type 2 diabetes mellitus with other diabetic neurological complication: Secondary | ICD-10-CM

## 2013-10-22 DIAGNOSIS — Z794 Long term (current) use of insulin: Secondary | ICD-10-CM

## 2013-10-22 DIAGNOSIS — D72829 Elevated white blood cell count, unspecified: Secondary | ICD-10-CM | POA: Diagnosis present

## 2013-10-22 DIAGNOSIS — Z01419 Encounter for gynecological examination (general) (routine) without abnormal findings: Secondary | ICD-10-CM

## 2013-10-22 DIAGNOSIS — R252 Cramp and spasm: Secondary | ICD-10-CM

## 2013-10-22 DIAGNOSIS — Z90710 Acquired absence of both cervix and uterus: Secondary | ICD-10-CM | POA: Diagnosis not present

## 2013-10-22 DIAGNOSIS — F3289 Other specified depressive episodes: Secondary | ICD-10-CM | POA: Diagnosis present

## 2013-10-22 DIAGNOSIS — Z6837 Body mass index (BMI) 37.0-37.9, adult: Secondary | ICD-10-CM

## 2013-10-22 DIAGNOSIS — E1142 Type 2 diabetes mellitus with diabetic polyneuropathy: Secondary | ICD-10-CM

## 2013-10-22 DIAGNOSIS — Z881 Allergy status to other antibiotic agents status: Secondary | ICD-10-CM | POA: Diagnosis not present

## 2013-10-22 DIAGNOSIS — L409 Psoriasis, unspecified: Secondary | ICD-10-CM

## 2013-10-22 DIAGNOSIS — J4489 Other specified chronic obstructive pulmonary disease: Secondary | ICD-10-CM | POA: Diagnosis present

## 2013-10-22 DIAGNOSIS — I10 Essential (primary) hypertension: Secondary | ICD-10-CM

## 2013-10-22 DIAGNOSIS — R195 Other fecal abnormalities: Secondary | ICD-10-CM

## 2013-10-22 DIAGNOSIS — Z85828 Personal history of other malignant neoplasm of skin: Secondary | ICD-10-CM

## 2013-10-22 DIAGNOSIS — Z800 Family history of malignant neoplasm of digestive organs: Secondary | ICD-10-CM

## 2013-10-22 DIAGNOSIS — E034 Atrophy of thyroid (acquired): Secondary | ICD-10-CM

## 2013-10-22 DIAGNOSIS — M79642 Pain in left hand: Secondary | ICD-10-CM

## 2013-10-22 DIAGNOSIS — I770 Arteriovenous fistula, acquired: Secondary | ICD-10-CM

## 2013-10-22 DIAGNOSIS — R200 Anesthesia of skin: Secondary | ICD-10-CM

## 2013-10-22 DIAGNOSIS — E1165 Type 2 diabetes mellitus with hyperglycemia: Secondary | ICD-10-CM

## 2013-10-22 DIAGNOSIS — T82898A Other specified complication of vascular prosthetic devices, implants and grafts, initial encounter: Secondary | ICD-10-CM

## 2013-10-22 LAB — BASIC METABOLIC PANEL
Anion gap: 14 (ref 5–15)
BUN: 27 mg/dL — ABNORMAL HIGH (ref 6–23)
CALCIUM: 9 mg/dL (ref 8.4–10.5)
CHLORIDE: 98 meq/L (ref 96–112)
CO2: 23 meq/L (ref 19–32)
CREATININE: 1.67 mg/dL — AB (ref 0.50–1.10)
GFR calc Af Amer: 36 mL/min — ABNORMAL LOW (ref >90)
GFR calc non Af Amer: 31 mL/min — ABNORMAL LOW (ref >90)
GLUCOSE: 345 mg/dL — AB (ref 70–99)
Potassium: 3.9 mEq/L (ref 3.7–5.3)
Sodium: 135 mEq/L — ABNORMAL LOW (ref 137–147)

## 2013-10-22 LAB — URINALYSIS, ROUTINE W REFLEX MICROSCOPIC
BILIRUBIN URINE: NEGATIVE
GLUCOSE, UA: 500 mg/dL — AB
Ketones, ur: NEGATIVE mg/dL
Leukocytes, UA: NEGATIVE
Nitrite: NEGATIVE
Protein, ur: 100 mg/dL — AB
Specific Gravity, Urine: 1.02 (ref 1.005–1.030)
UROBILINOGEN UA: 1 mg/dL (ref 0.0–1.0)
pH: 6 (ref 5.0–8.0)

## 2013-10-22 LAB — HEPATIC FUNCTION PANEL
ALBUMIN: 3.2 g/dL — AB (ref 3.5–5.2)
ALT: 32 U/L (ref 0–35)
AST: 34 U/L (ref 0–37)
Alkaline Phosphatase: 131 U/L — ABNORMAL HIGH (ref 39–117)
BILIRUBIN TOTAL: 0.3 mg/dL (ref 0.3–1.2)
Total Protein: 7.6 g/dL (ref 6.0–8.3)

## 2013-10-22 LAB — URINE MICROSCOPIC-ADD ON

## 2013-10-22 LAB — CBC WITH DIFFERENTIAL/PLATELET
Basophils Absolute: 0 10*3/uL (ref 0.0–0.1)
Basophils Relative: 0 % (ref 0–1)
EOS PCT: 0 % (ref 0–5)
Eosinophils Absolute: 0 10*3/uL (ref 0.0–0.7)
HEMATOCRIT: 35.9 % — AB (ref 36.0–46.0)
Hemoglobin: 11.9 g/dL — ABNORMAL LOW (ref 12.0–15.0)
LYMPHS ABS: 1.2 10*3/uL (ref 0.7–4.0)
LYMPHS PCT: 7 % — AB (ref 12–46)
MCH: 28.5 pg (ref 26.0–34.0)
MCHC: 33.1 g/dL (ref 30.0–36.0)
MCV: 86.1 fL (ref 78.0–100.0)
MONO ABS: 0.6 10*3/uL (ref 0.1–1.0)
Monocytes Relative: 3 % (ref 3–12)
Neutro Abs: 15.8 10*3/uL — ABNORMAL HIGH (ref 1.7–7.7)
Neutrophils Relative %: 90 % — ABNORMAL HIGH (ref 43–77)
Platelets: 340 10*3/uL (ref 150–400)
RBC: 4.17 MIL/uL (ref 3.87–5.11)
RDW: 14.6 % (ref 11.5–15.5)
WBC: 17.6 10*3/uL — AB (ref 4.0–10.5)

## 2013-10-22 LAB — PRO B NATRIURETIC PEPTIDE: Pro B Natriuretic peptide (BNP): 2668 pg/mL — ABNORMAL HIGH (ref 0–125)

## 2013-10-22 LAB — LIPASE, BLOOD: Lipase: 13 U/L (ref 11–59)

## 2013-10-22 MED ORDER — PIPERACILLIN-TAZOBACTAM 3.375 G IVPB
3.3750 g | Freq: Once | INTRAVENOUS | Status: AC
  Administered 2013-10-22: 3.375 g via INTRAVENOUS
  Filled 2013-10-22: qty 50

## 2013-10-22 MED ORDER — FUROSEMIDE 10 MG/ML IJ SOLN
80.0000 mg | Freq: Once | INTRAMUSCULAR | Status: AC
  Administered 2013-10-22: 80 mg via INTRAVENOUS
  Filled 2013-10-22: qty 8

## 2013-10-22 NOTE — ED Notes (Signed)
Pt c/o ble swelling/sob and ruq abd pain.

## 2013-10-22 NOTE — ED Provider Notes (Signed)
CSN: [id number]     Arrival date & time 10/22/13  1750 History  This chart was scribed for Shaune Pollack, MD by Girtha Hake, ED Scribe. The patient was seen in [place]. The patient's care was started at 8:10 PM.   Chief Complaint  Patient presents with  . Leg Swelling    The history is provided by the patient. No language interpreter was used.   HPI Comments: SHERAE KARNOPP is a 63 y.o. female with a history of renal insufficiency, DM, and HTN who presents to the Emergency Department complaining of right flank and RUQ abdominal pain beginning one week ago. Patient reports that the pain is exacerbated by movement or coughing. She also reports increased leg swelling for the past week. Patient denies dysuria or increased urinary frequency. She is allergic to vancomycin,lissinopril, and latex.    Patient reports that she takes Lasix on a daily basis. She has a fistula in left arm in preparation for dialysis. She saw Dr. Luan Pulling two days ago and was given Keflex for a URI. PCP is Dr. Laurance Flatten.      Past Medical History  Diagnosis Date  . Chest pain 2003    ?h/o MI, syncope; 2003 stress nuclear-inferoseptal scar, EF-54%; 2007-MI ruled out, echocardiogram-normal LV size, wall thickness and function  . RLS (restless legs syndrome)     sleep study in 2010: No apnea; moderate periodic movements  . GERD (gastroesophageal reflux disease)     H/o upper GI bleed  . GI bleeding   . Obese   . Gout   . Hypothyroidism   . Low back pain   . Cellulitis     Neck  . Diabetes mellitus 1998    A1c-7.1 in 2010, 6.7 in 2013  . Hyperlipidemia     Fasting lipids 2013:158, 152, 35, 93  . Hypertension 2002  . Anemia   . Hyperkalemia   . Irregular heart beat   . PONV (postoperative nausea and vomiting)   . Myocardial infarction   . CAD (coronary artery disease)     dr. Lattie Haw (Linna Hoff)  . Depression     Insomnia  . Headache(784.0)   . Vitiligo   . H/O hiatal hernia   . Arthritis   .  Cancer     HX SKIN CANCER  . Anginal pain     last cp few months ago.  Marland Kitchen Dysrhythmia     "skips a beat"  . COPD (chronic obstructive pulmonary disease)   . Chronic kidney disease, stage III (moderate)     Creat-1.9 in 04/2008, 2.65 in 02/2010(noHD  . Renal insufficiency   . Neuropathy    Past Surgical History  Procedure Laterality Date  . Appendectomy    . Carpal tunnel release  1992, 1993, 2011    Right in 2011; previously bilateral  . Tubal ligation  1976  . Partial hysterectomy  1985    Vaginal  . Shoulder surgery  1997    Left  . Incise and drain abcess  2010    Neck-MRSA  . Colonoscopy  02/2002, 07/2009    few diverticula, otherwise negative  . Foot surgery  1984    Bilateral heel spurs  . Abdominal hysterectomy    . Shoulder surgery      RIGHT  ADJUSTED   . Av fistula placement Left 07/16/2012    Procedure: ARTERIOVENOUS (AV) FISTULA CREATION;  Surgeon: Mal Misty, MD;  Location: [place];  Service: Vascular;  Laterality: Left;  .  Av fistula placement Left 07/29/2012    Procedure: ARTERIOVENOUS (AV) FISTULA CREATION;  Surgeon: Mal Misty, MD;  Location: [place];  Service: Vascular;  Laterality: Left;  Left Brachial-Cephalic Arteriovenous Fistula Creation  . Colonoscopy with esophagogastroduodenoscopy (egd) N/A 08/27/2012    RMR: Normal esophagus s/p 54 F dilator. Small hiatal hernia. Gastric polyp removed, oozing (hyperplastic), which could explain heme positive stool:Colonic diverticulosis  . Revison of arteriovenous fistula Left 08/03/2013    Procedure: BANDING OF BRACHIOCEPHALIC ARTERIOVENOUS FISTULA;  Surgeon: Conrad Newdale, MD;  Location: [place];  Service: Vascular;  Laterality: Left;  . Thrombectomy w/ embolectomy Left 08/03/2013    Procedure: THROMBECTOMY OF ARTERIOVENOUS FISTULA  AND REVISION OF  ARTERIOVENOUS FISTULA BANDING.;  Surgeon: Conrad [place], MD;  Location: [facility] OR;  Service: Vascular;  Laterality: Left;   Family History  Problem Relation Age of Onset  .  Emphysema Mother   . COPD Mother   . Colon cancer Father   . Diabetes Father   . Cancer Father     colon  . Hypertension Father   . Congestive Heart Failure Maternal Grandmother   . Cancer Paternal Grandmother    History  Substance Use Topics  . Smoking status: Never Smoker   . Smokeless tobacco: Never Used  . Alcohol Use: No   OB History   Grav Para Term Preterm Abortions TAB SAB Ect Mult Living                 Review of Systems  Cardiovascular: Positive for leg swelling.  Gastrointestinal: Positive for abdominal pain.  Genitourinary: Negative for dysuria and frequency.  All other systems reviewed and are negative.     Allergies  Latex; Vancomycin; Vancomycin hcl; and Quinapril hcl  Home Medications   Prior to Admission medications   Medication Sig Start Date End Date Taking? Authorizing Provider  albuterol (PROAIR HFA) 108 (90 BASE) MCG/ACT inhaler Inhale 2 puffs into the lungs every 6 (six) hours as needed for wheezing or shortness of breath. 07/04/13   Vernie Shanks, MD  allopurinol (ZYLOPRIM) 100 MG tablet Take 1 tablet (100 mg total) by mouth every morning. 07/04/13   Vernie Shanks, MD  amLODipine (NORVASC) 5 MG tablet Take 1 tablet (5 mg total) by mouth daily. 07/04/13   Vernie Shanks, MD  antipyrine-benzocaine Toniann Fail) otic solution Place 3-4 drops into the right ear every 2 (two) hours as needed for ear pain. 08/08/13   Lysbeth Penner, FNP  carvedilol (COREG) 25 MG tablet Take 1 tablet (25 mg total) by mouth 2 (two) times daily with a meal. 07/04/13   Vernie Shanks, MD  citalopram (CELEXA) 40 MG tablet Take 40 mg by mouth daily.    Historical Provider, MD  clobetasol (TEMOVATE) 0.05 % external solution Apply 1 application topically daily as needed. Applied to scalp for psoriasis 12/20/12   Historical Provider, MD  ferrous sulfate 325 (65 FE) MG tablet Take 1 tablet (325 mg total) by mouth daily with breakfast. 04/11/13   Orvil Feil, NP  furosemide (LASIX) 40 MG  tablet Take 1 tablet (40 mg total) by mouth 2 (two) times daily. 07/04/13   Vernie Shanks, MD  gabapentin (NEURONTIN) 800 MG tablet Take 1,600 mg by mouth at bedtime.     Historical Provider, MD  glucose blood (ACCU-CHEK AVIVA) test strip 1 each by Other route 3 (three) times daily. Use as instructed 09/30/13   Wardell Honour, MD  HYDROcodone-acetaminophen [facility]) [phone]  MG per tablet Take 1 tablet by mouth every 6 (six) hours as needed. 09/30/13   Wardell Honour, MD  insulin aspart (NOVOLOG FLEXPEN) 100 UNIT/ML FlexPen Inject 45 Units into the skin 3 (three) times daily with meals.    Historical Provider, MD  Insulin Glargine (LANTUS SOLOSTAR) 100 UNIT/ML Solostar Pen Inject 45 Units into the skin 2 (two) times daily. 45 units in the morning and 45 units at night 07/04/13   Vernie Shanks, MD  Insulin Pen Needle 31G X 5 MM MISC Use to inject insulin up to 5 times a day.  Dx:  250.03 10/06/13   Tammy Eckard, PHARMD  levothyroxine (SYNTHROID, LEVOTHROID) 50 MCG tablet Take 1 tablet (50 mcg total) by mouth every evening. 07/04/13   Vernie Shanks, MD  losartan (COZAAR) 50 MG tablet Take 50 mg by mouth daily.  06/13/13   Historical Provider, MD  nitroGLYCERIN (NITROSTAT) 0.4 MG SL tablet Place 1 tablet (0.4 mg total) under the tongue every 5 (five) minutes as needed for chest pain. 09/30/13   Wardell Honour, MD  omeprazole (PRILOSEC) 20 MG capsule Take 1 capsule (20 mg total) by mouth daily. 09/30/13   Wardell Honour, MD  potassium chloride (K-DUR,KLOR-CON) 10 MEQ tablet Take 1 tablet (10 mEq total) by mouth 2 (two) times daily. 09/30/13   Wardell Honour, MD  pravastatin (PRAVACHOL) 40 MG tablet Take 1 tablet (40 mg total) by mouth at bedtime. 09/30/13   Wardell Honour, MD  rOPINIRole (REQUIP) 1 MG tablet Take 1 tablet (1 mg total) by mouth at bedtime. 07/04/13   Vernie Shanks, MD  sodium bicarbonate 650 MG tablet Take 650 mg by mouth 2 (two) times daily.    Historical Provider, MD  tiotropium (SPIRIVA  HANDIHALER) 18 MCG inhalation capsule Place 1 capsule (18 mcg total) into inhaler and inhale every morning. 07/04/13   Vernie Shanks, MD  Vitamin D, Ergocalciferol, (DRISDOL) 50000 UNITS CAPS capsule Take 50,000 Units by mouth every 7 (seven) days. Monday    Historical Provider, MD   Triage Vitals: BP 156/64  Pulse 79  Temp(Src) 98.5 F (36.9 C)  Resp 20  Ht 5\' 7"  (1.702 m)  Wt 240 lb (108.863 kg)  BMI 37.58 kg/m2  SpO2 97% Physical Exam  Nursing note and vitals reviewed. Constitutional: She is oriented to person, place, and time. She appears well-developed and well-nourished. No distress.  HENT:  Head: Normocephalic and atraumatic.  Right Ear: External ear normal.  Left Ear: External ear normal.  Nose: Nose normal.  Mouth/Throat: Oropharynx is clear and moist.  Eyes: Conjunctivae and EOM are normal. Pupils are equal, round, and reactive to light.  Neck: Normal range of motion. Neck supple.  Cardiovascular: Normal rate, regular rhythm, normal heart sounds and intact distal pulses.   Pulmonary/Chest: Effort normal and breath sounds normal.  Abdominal: Soft. Bowel sounds are normal. There is tenderness.    Mild ttp upper abdomen  Musculoskeletal: Normal range of motion. She exhibits edema.  Neurological: She is alert and oriented to person, place, and time. She has normal reflexes.  Skin: Skin is warm and dry.  Psychiatric: She has a normal mood and affect. Her behavior is normal. Thought content normal.    ED Course  Procedures (including critical care time) DIAGNOSTIC STUDIES: Oxygen Saturation is 97% on room air, normal by my interpretation.    COORDINATION OF CARE: 8:13 PM-Discussed treatment plan which includes Lasix, abdominal CT without contrast, CXR, EKG, and  labs with pt at bedside and pt agreed to plan.   9:52 PM - Spoke with Dr. Darrick Meigs about plans for admission to Hospitalist Service. Also informed patient of plans for admission.  10:28 PM - Spoke with Dr. Redmond Pulling,  General Surgery. 11:02 PM - Spoke with Dr. Redmond Pulling. He thinks that it would be best to admit the patient to hospitalist  Labs Review Labs Reviewed  CBC WITH DIFFERENTIAL - Abnormal; Notable for the following:    WBC 17.6 (*)    Hemoglobin 11.9 (*)    HCT 35.9 (*)    Neutrophils Relative % 90 (*)    Neutro Abs 15.8 (*)    Lymphocytes Relative 7 (*)    All other components within normal limits  BASIC METABOLIC PANEL - Abnormal; Notable for the following:    Sodium 135 (*)    Glucose, Bld 345 (*)    BUN 27 (*)    Creatinine, Ser 1.67 (*)    GFR calc non Af Amer 31 (*)    GFR calc Af Amer 36 (*)    All other components within normal limits  PRO B NATRIURETIC PEPTIDE - Abnormal; Notable for the following:    Pro B Natriuretic peptide (BNP) 2668.0 (*)    All other components within normal limits  HEPATIC FUNCTION PANEL  URINALYSIS, ROUTINE W REFLEX MICROSCOPIC  LIPASE, BLOOD    Imaging Review Dg Chest 2 View  10/22/2013   CLINICAL DATA:  Chronic shortness of breath, lower extremity swelling  EXAM: CHEST  2 VIEW  COMPARISON:  03/14/2013  FINDINGS: Pulmonary vascular congestion without frank interstitial edema. Mild bilateral lower lobe opacities, likely atelectasis. No pleural effusion or pneumothorax.  Cardiomegaly.  Mild degenerative changes of the visualized thoracolumbar spine.  IMPRESSION: Pulmonary vascular congestion without frank interstitial edema.  Mild bilateral lower lobe opacities, likely atelectasis.   Electronically Signed   By: Julian Hy M.D.   On: 10/22/2013 18:16     EKG Interpretation None      MDM   Final diagnoses:  A-V fistula  Cholecystitis  Chronic kidney disease, stage IV (severe)  DM type 2, uncontrolled, with neuropathy  Essential hypertension  RLS (restless legs syndrome)   1- ruq pain- ct with possible cholecystitis- patient will n eed [place] in a.m, surgery to be consulted, lipase and lft normal, wbc elevated.   2-renal insufficiency  3-  fluid retention with chf- patient being diuresed 4- hyperglycemia- no evidence of dka, patient will have iv  Insulin, no hydration given as being diuresed for volume overload. Plan consult hospitalist for admission  Patient's care discussed with Drs. [place] and [place].  Dr. Redmond Pulling felt patient best served by tx to [facility] as likely to require surgery and has complex medical issues.  Dr. Darrick Meigs arranged for interhospitalist transfer  I personally performed the services described in this documentation, which was scribed in my presence. The recorded information has been reviewed and considered.   Shaune Pollack, MD 10/24/13 1153

## 2013-10-22 NOTE — ED Notes (Signed)
Pt ambulated to ED nurse station from triage waiting area without difficulty.

## 2013-10-22 NOTE — H&P (Signed)
PCP:   Redge Gainer, MD   Chief Complaint:  Abdominal pain  HPI: 63 year old female who   has a past medical history of Chest pain (2003); RLS (restless legs syndrome); GERD (gastroesophageal reflux disease); GI bleeding; Obese; Gout; Hypothyroidism; Low back pain; Cellulitis; Diabetes mellitus (1998); Hyperlipidemia; Hypertension (2002); Anemia; Hyperkalemia; Irregular heart beat; PONV (postoperative nausea and vomiting); Myocardial infarction; CAD (coronary artery disease); Depression; Headache(784.0); Vitiligo; H/O hiatal hernia; Arthritis; Cancer; Anginal pain; Dysrhythmia; COPD (chronic obstructive pulmonary disease); Chronic kidney disease, stage III (moderate); Renal insufficiency; and Neuropathy. Today presented to the ED with chief complaint of abdominal pain right upper quadrant and right flank going on for a week. The pain is worse on movement and coughing. 8/10 in intensity. She denies dysuria urgency or frequency of urination. Patient also has been complaining of increasing leg swelling over the past few days. Patient has history of C. KD stage 3, and has AV fistula. She has been followed by nephrology as outpatient patient denies nausea or vomiting or diarrhea. No chest pain or shortness of breath. In the ED labs reveal BNP elevated 2668. Patient takes high-dose Lasix at home.  She was given Lasix 80 mg IV x1 in the ED. CT scan abdomen and pelvis was done without contrast, which showed indistinctness of the gallbladder wall which could be due to the inflammation, raising possibility of cholecystitis. General surgery was consulted at [facility], and they recommended to transfer the patient to [facility] for possible surgery in a.m.  Allergies:   Allergies  Allergen Reactions  . Latex Shortness Of Breath and Swelling    Facial swelling  . Vancomycin Shortness Of Breath  . Vancomycin Hcl Swelling    REACTION: facial swelling  . Quinapril Hcl Swelling and Rash    Arm swelling       Past Medical History  Diagnosis Date  . Chest pain 2003    ?h/o MI, syncope; 2003 stress nuclear-inferoseptal scar, EF-54%; 2007-MI ruled out, echocardiogram-normal LV size, wall thickness and function  . RLS (restless legs syndrome)     sleep study in 2010: No apnea; moderate periodic movements  . GERD (gastroesophageal reflux disease)     H/o upper GI bleed  . GI bleeding   . Obese   . Gout   . Hypothyroidism   . Low back pain   . Cellulitis     Neck  . Diabetes mellitus 1998    A1c-7.1 in 2010, 6.7 in 2013  . Hyperlipidemia     Fasting lipids 2013:158, 152, 35, 93  . Hypertension 2002  . Anemia   . Hyperkalemia   . Irregular heart beat   . PONV (postoperative nausea and vomiting)   . Myocardial infarction   . CAD (coronary artery disease)     dr. Lattie Haw ([facility]na [facility])  . Depression     Insomnia  . Headache(784.0)   . Vitiligo   . H/O hiatal hernia   . Arthritis   . Cancer     HX SKIN CANCER  . Anginal pain     last cp few months ago.  Marland Kitchen Dysrhythmia     "skips a beat"  . COPD (chronic obstructive pulmonary disease)   . Chronic kidney disease, stage III (moderate)     Creat-1.9 in 04/2008, 2.65 in 02/2010(noHD  . Renal insufficiency   . Neuropathy     Past Surgical History  Procedure Laterality Date  . Appendectomy    . Carpal tunnel release  1992, 1993, 2011  Right in 2011; previously bilateral  . Tubal ligation  1976  . Partial hysterectomy  1985    Vaginal  . Shoulder surgery  1997    Left  . Incise and drain abcess  2010    Neck-MRSA  . Colonoscopy  02/2002, 07/2009    few diverticula, otherwise negative  . Foot surgery  1984    Bilateral heel spurs  . Abdominal hysterectomy    . Shoulder surgery      RIGHT  ADJUSTED   . Av fistula placement Left 07/16/2012    Procedure: ARTERIOVENOUS (AV) FISTULA CREATION;  Surgeon: Mal Misty, MD;  Location: [facility];  Service: Vascular;  Laterality: Left;  . Av fistula placement Left 07/29/2012     Procedure: ARTERIOVENOUS (AV) FISTULA CREATION;  Surgeon: Mal Misty, MD;  Location: [facility];  Service: Vascular;  Laterality: Left;  Left Brachial-Cephalic Arteriovenous Fistula Creation  . Colonoscopy with esophagogastroduodenoscopy (egd) N/A 08/27/2012    RMR: Normal esophagus s/p 54 F dilator. Small hiatal hernia. Gastric polyp removed, oozing (hyperplastic), which could explain heme positive stool:Colonic diverticulosis  . Revison of arteriovenous fistula Left 08/03/2013    Procedure: BANDING OF BRACHIOCEPHALIC ARTERIOVENOUS FISTULA;  Surgeon: Conrad Hyde, MD;  Location: [place] [facility];  Service: Vascular;  Laterality: Left;  . Thrombectomy w/ embolectomy Left 08/03/2013    Procedure: THROMBECTOMY OF ARTERIOVENOUS FISTULA  AND REVISION OF  ARTERIOVENOUS FISTULA BANDING.;  Surgeon: Conrad Alapaha, MD;  Location: [facility];  Service: Vascular;  Laterality: Left;    Prior to Admission medications   Medication Sig Start Date End Date Taking? Authorizing Provider  albuterol (PROAIR HFA) 108 (90 BASE) MCG/ACT inhaler Inhale 2 puffs into the lungs every 6 (six) hours as needed for wheezing or shortness of breath. 07/04/13  Yes Vernie Shanks, MD  allopurinol (ZYLOPRIM) 100 MG tablet Take 1 tablet (100 mg total) by mouth every morning. 07/04/13  Yes Vernie Shanks, MD  amLODipine (NORVASC) 5 MG tablet Take 1 tablet (5 mg total) by mouth daily. 07/04/13  Yes Vernie Shanks, MD  carvedilol (COREG) 25 MG tablet Take 1 tablet (25 mg total) by mouth 2 (two) times daily with a meal. 07/04/13  Yes Vernie Shanks, MD  citalopram (CELEXA) 40 MG tablet Take 40 mg by mouth daily.   Yes Historical Provider, MD  clobetasol (TEMOVATE) 0.05 % external solution Apply 1 application topically daily as needed. Applied to scalp for psoriasis 12/20/12  Yes Historical Provider, MD  ferrous sulfate 325 (65 FE) MG tablet Take 1 tablet (325 mg total) by mouth daily with breakfast. 04/11/13  Yes Orvil Feil, NP  Fluticasone Furoate-Vilanterol (BREO  ELLIPTA) 100-25 MCG/INH AEPB Inhale 1 puff into the lungs 2 (two) times daily.   Yes Historical Provider, MD  furosemide (LASIX) 40 MG tablet Take 40-80 mg by mouth 2 (two) times daily. 2 tablets in the morning and 1 tablet in the evening   Yes Historical Provider, MD  gabapentin (NEURONTIN) 800 MG tablet Take 1,600 mg by mouth at bedtime.    Yes Historical Provider, MD  HYDROcodone-acetaminophen (NORCO) 10-325 MG per tablet Take 1 tablet by mouth every 6 (six) hours as needed. 09/30/13  Yes Wardell Honour, MD  insulin aspart (NOVOLOG FLEXPEN) 100 UNIT/ML FlexPen Inject 45 Units into the skin 3 (three) times daily with meals.   Yes Historical Provider, MD  Insulin Glargine (LANTUS SOLOSTAR) 100 UNIT/ML Solostar Pen Inject 30-60 Units into the skin 2 (two)  times daily. 60 units in the morning and 30 units at night 07/04/13  Yes Vernie Shanks, MD  levothyroxine (SYNTHROID, LEVOTHROID) 50 MCG tablet Take 1 tablet (50 mcg total) by mouth every evening. 07/04/13  Yes Vernie Shanks, MD  linagliptin (TRADJENTA) 5 MG TABS tablet Take 5 mg by mouth daily.   Yes Historical Provider, MD  losartan (COZAAR) 50 MG tablet Take 50 mg by mouth daily.  06/13/13  Yes Historical Provider, MD  nitroGLYCERIN (NITROSTAT) 0.4 MG SL tablet Place 1 tablet (0.4 mg total) under the tongue every 5 (five) minutes as needed for chest pain. 09/30/13  Yes Wardell Honour, MD  omeprazole (PRILOSEC) 20 MG capsule Take 1 capsule (20 mg total) by mouth daily. 09/30/13  Yes Wardell Honour, MD  potassium chloride (K-DUR,KLOR-CON) 10 MEQ tablet Take 1 tablet (10 mEq total) by mouth 2 (two) times daily. 09/30/13  Yes Wardell Honour, MD  pravastatin (PRAVACHOL) 40 MG tablet Take 1 tablet (40 mg total) by mouth at bedtime. 09/30/13  Yes Wardell Honour, MD  rOPINIRole (REQUIP) 1 MG tablet Take 1 tablet (1 mg total) by mouth at bedtime. 07/04/13  Yes Vernie Shanks, MD  sodium bicarbonate 650 MG tablet Take 650 mg by mouth 3 (three) times  daily.    Yes Historical Provider, MD  tiotropium (SPIRIVA HANDIHALER) 18 MCG inhalation capsule Place 1 capsule (18 mcg total) into inhaler and inhale every morning. 07/04/13  Yes Vernie Shanks, MD    Social History:  reports that she has never smoked. She has never used smokeless tobacco. She reports that she does not drink alcohol or use illicit drugs.  Family History  Problem Relation Age of Onset  . Emphysema Mother   . COPD Mother   . Colon cancer Father   . Diabetes Father   . Cancer Father     colon  . Hypertension Father   . Congestive Heart Failure Maternal Grandmother   . Cancer Paternal Grandmother      All the positives are listed in BOLD  Review of Systems:  HEENT: Headache, blurred vision, runny nose, sore throat Neck: Hypothyroidism, hyperthyroidism,,lymphadenopathy Chest : Shortness of breath, history of COPD, Asthma Heart : Chest pain, history of coronary arterey disease GI:  Nausea, vomiting, diarrhea, constipation, GERD GU: Dysuria, urgency, frequency of urination, hematuria Neuro: Stroke, seizures, syncope Psych: Depression, anxiety, hallucinations   Physical Exam: Blood pressure 159/70, pulse 72, temperature 98.5 F (36.9 C), resp. rate 20, height 5\' 7"  (1.702 m), weight 108.863 kg (240 lb), SpO2 98.00%. Constitutional:   Patient is a well-developed and well-nourished female in no acute distress and cooperative with exam. Head: Normocephalic and atraumatic Mouth: Mucus membranes moist Eyes: PERRL, EOMI, conjunctivae normal Neck: Supple, No Thyromegaly Cardiovascular: RRR, S1 normal, S2 normal Pulmonary/Chest: CTAB, no wheezes, rales, or rhonchi Abdominal: Soft. Positive right upper quadrant tenderness on palpation, non-distended, bowel sounds are normal, no masses, organomegaly, or guarding present.  Neurological: A&O x3, Strenght is normal and symmetric bilaterally, cranial nerve II-XII are grossly intact, no focal motor deficit, sensory intact to  light touch bilaterally.  Extremities : Bilateral 2+ pitting edema  Labs on Admission:  Basic Metabolic Panel:  Recent Labs Lab 10/22/13 1827  NA 135*  K 3.9  CL 98  CO2 23  GLUCOSE 345*  BUN 27*  CREATININE 1.67*  CALCIUM 9.0   Liver Function Tests:  Recent Labs Lab 10/22/13 2007  AST 34  ALT 32  ALKPHOS  131*  BILITOT 0.3  PROT 7.6  ALBUMIN 3.2*    Recent Labs Lab 10/22/13 2007  LIPASE 13   No results found for this basename: AMMONIA,  in the last 168 hours CBC:  Recent Labs Lab 10/22/13 1827  WBC 17.6*  NEUTROABS 15.8*  HGB 11.9*  HCT 35.9*  MCV 86.1  PLT 340   Cardiac Enzymes: No results found for this basename: CKTOTAL, CKMB, CKMBINDEX, TROPONINI,  in the last 168 hours  BNP (last 3 results)  Recent Labs  10/22/13 1827  PROBNP 2668.0*   CBG: No results found for this basename: GLUCAP,  in the last 168 hours  Radiological Exams on Admission: Ct Abdomen Pelvis Wo Contrast  10/22/2013   CLINICAL DATA:  Chest pain and shortness of breath, abdominal pain  EXAM: CT ABDOMEN AND PELVIS WITHOUT CONTRAST  TECHNIQUE: Multidetector CT imaging of the abdomen and pelvis was performed following the standard protocol without IV contrast.  COMPARISON:  None.  FINDINGS: Lung bases show mild atelectatic changes bilaterally.  The liver, spleen, adrenal glands and pancreas are within normal limits. The gallbladder is well distended and demonstrates some indistinctness of the wall which may be related wall thickening and wall edema. Kidneys are well visualized bilaterally but demonstrate some mild perinephric stranding. No obstructive changes are seen. No renal calculi are noted. Diverticular change is noted without diverticulitis. The appendix is within normal limits. The bladder is well distended. The uterus has been surgically removed. Degenerative changes of the lumbar spine are noted.  IMPRESSION: Indistinctness of the gallbladder wall which may be related to  gallbladder wall edema and inflammation. In the appropriate clinical setting this could represent acute cholecystitis. Ultrasound of the right upper quadrant may be helpful for further evaluation as necessary.   Electronically Signed   By: Inez Catalina M.D.   On: 10/22/2013 21:10   Dg Chest 2 View  10/22/2013   CLINICAL DATA:  Chronic shortness of breath, lower extremity swelling  EXAM: CHEST  2 VIEW  COMPARISON:  03/14/2013  FINDINGS: Pulmonary vascular congestion without frank interstitial edema. Mild bilateral lower lobe opacities, likely atelectasis. No pleural effusion or pneumothorax.  Cardiomegaly.  Mild degenerative changes of the visualized thoracolumbar spine.  IMPRESSION: Pulmonary vascular congestion without frank interstitial edema.  Mild bilateral lower lobe opacities, likely atelectasis.   Electronically Signed   By: Julian Hy M.D.   On: 10/22/2013 18:16    EKG: Independently reviewed. Normal sinus rhythm   Assessment/Plan Principal Problem:   Cholecystitis Active Problems:   Hypothyroidism   Hypertension   RLS (restless legs syndrome)   Chronic kidney disease, stage IV (severe)   DM type 2, uncontrolled, with neuropathy   Hyperglycemia  ? Acute cholecystitis Patient will be started on IV Zosyn, will keep the patient n.p.o. for possible surgery in a.m. ED physician spoke to Dr. Redmond Pulling who is on call for general surgery, will see the patient in a.m. Will give Zofran when necessary for nausea and vomiting, morphine when necessary for pain  Bilateral lower extremity edema Patient has elevated BNP, last echocardiogram done in 2014 showed EF in the range of 55-60%. Patient is high dose Lasix at home, she has been given one dose of Lasix 80 mg IV x1 in the ED. She has elevated BUN/creatinine, follow BMP in a.m., and based on kidney functions, consider starting Lasix in a.m.  Diabetes mellitus Will continue on Lantus and sliding scale insulin  Chronic kidney disease  stage III Patient's creatinine is  1.67, she has been taking Lasix at home, follow BMP in am.   DVT prophylaxis Heparin  I called and discussed with [facility] Roel Cluck at [facility], she will be the accepting physician.  Code status: Patient is full code  Family discussion: No family at bedside   Time Spent on Admission: 60 min  [facility] Pager: [phone] 10/22/2013, 11:36 PM  If 7PM-7AM, please contact night-coverage  [email or web address]  Password [email or web address]

## 2013-10-23 ENCOUNTER — Inpatient Hospital Stay (HOSPITAL_COMMUNITY): Payer: Medicare HMO

## 2013-10-23 ENCOUNTER — Encounter (HOSPITAL_COMMUNITY): Payer: Self-pay | Admitting: *Deleted

## 2013-10-23 DIAGNOSIS — I10 Essential (primary) hypertension: Secondary | ICD-10-CM

## 2013-10-23 DIAGNOSIS — R109 Unspecified abdominal pain: Secondary | ICD-10-CM

## 2013-10-23 LAB — CBC
HCT: 34.4 % — ABNORMAL LOW (ref 36.0–46.0)
Hemoglobin: 11.1 g/dL — ABNORMAL LOW (ref 12.0–15.0)
MCH: 27.6 pg (ref 26.0–34.0)
MCHC: 32.3 g/dL (ref 30.0–36.0)
MCV: 85.6 fL (ref 78.0–100.0)
PLATELETS: 349 10*3/uL (ref 150–400)
RBC: 4.02 MIL/uL (ref 3.87–5.11)
RDW: 14.6 % (ref 11.5–15.5)
WBC: 19.2 10*3/uL — AB (ref 4.0–10.5)

## 2013-10-23 LAB — COMPREHENSIVE METABOLIC PANEL
ALBUMIN: 3.2 g/dL — AB (ref 3.5–5.2)
ALK PHOS: 113 U/L (ref 39–117)
ALT: 33 U/L (ref 0–35)
AST: 42 U/L — AB (ref 0–37)
Anion gap: 16 — ABNORMAL HIGH (ref 5–15)
BUN: 28 mg/dL — ABNORMAL HIGH (ref 6–23)
CO2: 24 mEq/L (ref 19–32)
CREATININE: 1.55 mg/dL — AB (ref 0.50–1.10)
Calcium: 8.9 mg/dL (ref 8.4–10.5)
Chloride: 97 mEq/L (ref 96–112)
GFR calc Af Amer: 40 mL/min — ABNORMAL LOW (ref >90)
GFR, EST NON AFRICAN AMERICAN: 34 mL/min — AB (ref >90)
Glucose, Bld: 199 mg/dL — ABNORMAL HIGH (ref 70–99)
POTASSIUM: 4.5 meq/L (ref 3.7–5.3)
SODIUM: 137 meq/L (ref 137–147)
Total Bilirubin: 0.4 mg/dL (ref 0.3–1.2)
Total Protein: 7.5 g/dL (ref 6.0–8.3)

## 2013-10-23 LAB — SURGICAL PCR SCREEN
MRSA, PCR: NEGATIVE
Staphylococcus aureus: NEGATIVE

## 2013-10-23 LAB — GLUCOSE, CAPILLARY
GLUCOSE-CAPILLARY: 210 mg/dL — AB (ref 70–99)
GLUCOSE-CAPILLARY: 62 mg/dL — AB (ref 70–99)
Glucose-Capillary: 110 mg/dL — ABNORMAL HIGH (ref 70–99)
Glucose-Capillary: 140 mg/dL — ABNORMAL HIGH (ref 70–99)
Glucose-Capillary: 65 mg/dL — ABNORMAL LOW (ref 70–99)
Glucose-Capillary: 72 mg/dL (ref 70–99)
Glucose-Capillary: 99 mg/dL (ref 70–99)

## 2013-10-23 MED ORDER — CITALOPRAM HYDROBROMIDE 40 MG PO TABS
40.0000 mg | ORAL_TABLET | Freq: Every day | ORAL | Status: DC
  Administered 2013-10-23: 40 mg via ORAL
  Filled 2013-10-23 (×2): qty 1

## 2013-10-23 MED ORDER — ALLOPURINOL 100 MG PO TABS
100.0000 mg | ORAL_TABLET | Freq: Every morning | ORAL | Status: DC
  Administered 2013-10-23 – 2013-10-25 (×3): 100 mg via ORAL
  Filled 2013-10-23 (×3): qty 1

## 2013-10-23 MED ORDER — GABAPENTIN 400 MG PO CAPS
1600.0000 mg | ORAL_CAPSULE | Freq: Every day | ORAL | Status: DC
  Administered 2013-10-23 – 2013-10-24 (×3): 1600 mg via ORAL
  Filled 2013-10-23 (×4): qty 4

## 2013-10-23 MED ORDER — INSULIN GLARGINE 100 UNIT/ML ~~LOC~~ SOLN
60.0000 [IU] | Freq: Every day | SUBCUTANEOUS | Status: DC
  Administered 2013-10-23: 60 [IU] via SUBCUTANEOUS
  Filled 2013-10-23: qty 0.6

## 2013-10-23 MED ORDER — DEXTROSE 50 % IV SOLN
INTRAVENOUS | Status: AC
  Administered 2013-10-23: 25 mL
  Filled 2013-10-23: qty 50

## 2013-10-23 MED ORDER — ALBUTEROL SULFATE (2.5 MG/3ML) 0.083% IN NEBU: 3.0000 mL | INHALATION_SOLUTION | Freq: Four times a day (QID) | RESPIRATORY_TRACT | Status: DC | PRN

## 2013-10-23 MED ORDER — DEXTROSE 50 % IV SOLN
INTRAVENOUS | Status: AC
  Filled 2013-10-23: qty 50

## 2013-10-23 MED ORDER — GABAPENTIN 800 MG PO TABS
1600.0000 mg | ORAL_TABLET | Freq: Every day | ORAL | Status: DC
  Filled 2013-10-23: qty 2

## 2013-10-23 MED ORDER — CARVEDILOL 25 MG PO TABS
25.0000 mg | ORAL_TABLET | Freq: Two times a day (BID) | ORAL | Status: DC
  Administered 2013-10-23 – 2013-10-25 (×5): 25 mg via ORAL
  Filled 2013-10-23 (×7): qty 1

## 2013-10-23 MED ORDER — SODIUM CHLORIDE 0.9 % IV SOLN: 250.0000 mL | INTRAVENOUS | Status: DC | PRN

## 2013-10-23 MED ORDER — HEPARIN SODIUM (PORCINE) 5000 UNIT/ML IJ SOLN
5000.0000 [IU] | Freq: Three times a day (TID) | INTRAMUSCULAR | Status: DC
  Administered 2013-10-23 – 2013-10-24 (×6): 5000 [IU] via SUBCUTANEOUS
  Filled 2013-10-23 (×10): qty 1

## 2013-10-23 MED ORDER — INSULIN GLARGINE 100 UNIT/ML SOLOSTAR PEN: 30.0000 [IU] | PEN_INJECTOR | Freq: Two times a day (BID) | SUBCUTANEOUS | Status: DC

## 2013-10-23 MED ORDER — SIMVASTATIN 20 MG PO TABS
20.0000 mg | ORAL_TABLET | Freq: Every day | ORAL | Status: DC
  Administered 2013-10-23 – 2013-10-24 (×2): 20 mg via ORAL
  Filled 2013-10-23 (×3): qty 1

## 2013-10-23 MED ORDER — AMLODIPINE BESYLATE 5 MG PO TABS
5.0000 mg | ORAL_TABLET | Freq: Every day | ORAL | Status: DC
  Administered 2013-10-23 – 2013-10-25 (×3): 5 mg via ORAL
  Filled 2013-10-23 (×3): qty 1

## 2013-10-23 MED ORDER — MORPHINE SULFATE 2 MG/ML IJ SOLN
2.0000 mg | INTRAMUSCULAR | Status: DC | PRN
  Administered 2013-10-23 (×3): 2 mg via INTRAVENOUS
  Filled 2013-10-23 (×3): qty 1

## 2013-10-23 MED ORDER — CETYLPYRIDINIUM CHLORIDE 0.05 % MT LIQD
7.0000 mL | Freq: Two times a day (BID) | OROMUCOSAL | Status: DC
  Administered 2013-10-23 – 2013-10-24 (×3): 7 mL via OROMUCOSAL

## 2013-10-23 MED ORDER — LEVOTHYROXINE SODIUM 50 MCG PO TABS
50.0000 ug | ORAL_TABLET | Freq: Every evening | ORAL | Status: DC
  Administered 2013-10-23 – 2013-10-24 (×2): 50 ug via ORAL
  Filled 2013-10-23 (×3): qty 1

## 2013-10-23 MED ORDER — DEXTROSE 50 % IV SOLN
25.0000 mL | Freq: Once | INTRAVENOUS | Status: AC | PRN
  Administered 2013-10-23: 25 mL via INTRAVENOUS

## 2013-10-23 MED ORDER — TIOTROPIUM BROMIDE MONOHYDRATE 18 MCG IN CAPS
18.0000 ug | ORAL_CAPSULE | Freq: Every morning | RESPIRATORY_TRACT | Status: DC
  Administered 2013-10-23 – 2013-10-25 (×3): 18 ug via RESPIRATORY_TRACT
  Filled 2013-10-23: qty 5

## 2013-10-23 MED ORDER — CHLORHEXIDINE GLUCONATE 0.12 % MT SOLN
15.0000 mL | Freq: Two times a day (BID) | OROMUCOSAL | Status: DC
  Administered 2013-10-23 – 2013-10-24 (×3): 15 mL via OROMUCOSAL
  Filled 2013-10-23 (×6): qty 15

## 2013-10-23 MED ORDER — INSULIN ASPART 100 UNIT/ML ~~LOC~~ SOLN
3.0000 [IU] | Freq: Once | SUBCUTANEOUS | Status: AC
  Administered 2013-10-23: 3 [IU] via SUBCUTANEOUS

## 2013-10-23 MED ORDER — PIPERACILLIN-TAZOBACTAM 3.375 G IVPB
3.3750 g | Freq: Three times a day (TID) | INTRAVENOUS | Status: DC
  Administered 2013-10-23 – 2013-10-25 (×7): 3.375 g via INTRAVENOUS
  Filled 2013-10-23 (×8): qty 50

## 2013-10-23 MED ORDER — SODIUM CHLORIDE 0.9 % IV SOLN
INTRAVENOUS | Status: DC
  Administered 2013-10-23 (×2): via INTRAVENOUS

## 2013-10-23 MED ORDER — SODIUM CHLORIDE 0.9 % IJ SOLN: 3.0000 mL | INTRAMUSCULAR | Status: DC | PRN

## 2013-10-23 MED ORDER — INSULIN GLARGINE 100 UNIT/ML ~~LOC~~ SOLN
30.0000 [IU] | Freq: Every day | SUBCUTANEOUS | Status: DC
  Filled 2013-10-23: qty 0.3

## 2013-10-23 MED ORDER — ROPINIROLE HCL 1 MG PO TABS
1.0000 mg | ORAL_TABLET | Freq: Every day | ORAL | Status: DC
  Administered 2013-10-23 – 2013-10-24 (×3): 1 mg via ORAL
  Filled 2013-10-23 (×4): qty 1

## 2013-10-23 MED ORDER — SODIUM BICARBONATE 650 MG PO TABS
650.0000 mg | ORAL_TABLET | Freq: Three times a day (TID) | ORAL | Status: DC
  Administered 2013-10-23 – 2013-10-25 (×8): 650 mg via ORAL
  Filled 2013-10-23 (×11): qty 1

## 2013-10-23 MED ORDER — ONDANSETRON HCL 4 MG PO TABS: 4.0000 mg | ORAL_TABLET | Freq: Four times a day (QID) | ORAL | Status: DC | PRN

## 2013-10-23 MED ORDER — SODIUM CHLORIDE 0.9 % IJ SOLN
3.0000 mL | Freq: Two times a day (BID) | INTRAMUSCULAR | Status: DC
  Administered 2013-10-23: 3 mL via INTRAVENOUS

## 2013-10-23 MED ORDER — INSULIN ASPART 100 UNIT/ML ~~LOC~~ SOLN: 0.0000 [IU] | Freq: Three times a day (TID) | SUBCUTANEOUS | Status: DC

## 2013-10-23 MED ORDER — MOMETASONE FURO-FORMOTEROL FUM 100-5 MCG/ACT IN AERO
2.0000 | INHALATION_SPRAY | Freq: Two times a day (BID) | RESPIRATORY_TRACT | Status: DC
  Administered 2013-10-23 – 2013-10-25 (×4): 2 via RESPIRATORY_TRACT
  Filled 2013-10-23: qty 8.8

## 2013-10-23 MED ORDER — ONDANSETRON HCL 4 MG/2ML IJ SOLN: 4.0000 mg | Freq: Four times a day (QID) | INTRAMUSCULAR | Status: DC | PRN

## 2013-10-23 MED ORDER — ALBUTEROL SULFATE (2.5 MG/3ML) 0.083% IN NEBU: 3.0000 mL | INHALATION_SOLUTION | RESPIRATORY_TRACT | Status: DC | PRN

## 2013-10-23 NOTE — Consult Note (Signed)
Reason for Consult:right-sided abdominal pain Referring Physician: Dr. Eleonore Chiquito  [name] is an 63 y.o. female.  HPI: this patient has been transferred down from [facility] for evaluation of right-sided abdominal pain. She reports the sudden onset of right-sided sharp abdominal pain slightly less than one week ago. She describes the pain as constant and moderate in intensity. She does not related to anything she has eaten. She has no prior history of abdominal pain. She has had some slight nausea but no vomiting.  Bowel movements have been fairly normal. She had a completely noncontrasted CAT scan her abdomen which showed some vagueness the gallbladder so cholecystitis could not be ruled out. She currently has no other complaints. She denies shortness of breath or chest pain. Again, she has no prior history of similar abdominal pain.  Past Medical History  Diagnosis Date  . Chest pain 2003    ?h/o MI, syncope; 2003 stress nuclear-inferoseptal scar, EF-54%; 2007-MI ruled out, echocardiogram-normal LV size, wall thickness and function  . RLS (restless legs syndrome)     sleep study in 2010: No apnea; moderate periodic movements  . GERD (gastroesophageal reflux disease)     H/o upper GI bleed  . GI bleeding   . Obese   . Gout   . Hypothyroidism   . Low back pain   . Cellulitis     Neck  . Diabetes mellitus 1998    A1c-7.1 in 2010, 6.7 in 2013  . Hyperlipidemia     Fasting lipids 2013:158, 152, 35, 93  . Hypertension 2002  . Anemia   . Hyperkalemia   . Irregular heart beat   . PONV (postoperative nausea and vomiting)   . Myocardial infarction   . CAD (coronary artery disease)     dr. Lattie Haw ([facility])  . Depression     Insomnia  . Headache(784.0)   . Vitiligo   . H/O hiatal hernia   . Arthritis   . Cancer     HX SKIN CANCER  . Anginal pain     last cp few months ago.  Marland Kitchen Dysrhythmia     "skips a beat"  . COPD (chronic obstructive pulmonary disease)   .  Chronic kidney disease, stage III (moderate)     Creat-1.9 in 04/2008, 2.65 in 02/2010(noHD  . Renal insufficiency   . Neuropathy     Past Surgical History  Procedure Laterality Date  . Appendectomy    . Carpal tunnel release  1992, 1993, 2011    Right in 2011; previously bilateral  . Tubal ligation  1976  . Partial hysterectomy  1985    Vaginal  . Shoulder surgery  1997    Left  . Incise and drain abcess  2010    Neck-MRSA  . Colonoscopy  02/2002, 07/2009    few diverticula, otherwise negative  . Foot surgery  1984    Bilateral heel spurs  . Abdominal hysterectomy    . Shoulder surgery      RIGHT  ADJUSTED   . Av fistula placement Left 07/16/2012    Procedure: ARTERIOVENOUS (AV) FISTULA CREATION;  Surgeon: Mal Misty, MD;  Location: [place];  Service: Vascular;  Laterality: Left;  . Av fistula placement Left 07/29/2012    Procedure: ARTERIOVENOUS (AV) FISTULA CREATION;  Surgeon: Mal Misty, MD;  Location: [place];  Service: Vascular;  Laterality: Left;  Left Brachial-Cephalic Arteriovenous Fistula Creation  . Colonoscopy with esophagogastroduodenoscopy (egd) N/A 08/27/2012    RMR: Normal  esophagus s/p 71 F dilator. Small hiatal hernia. Gastric polyp removed, oozing (hyperplastic), which could explain heme positive stool:Colonic diverticulosis  . Revison of arteriovenous fistula Left 08/03/2013    Procedure: BANDING OF BRACHIOCEPHALIC ARTERIOVENOUS FISTULA;  Surgeon: Conrad Wallaceton, MD;  Location: [place];  Service: Vascular;  Laterality: Left;  . Thrombectomy w/ embolectomy Left 08/03/2013    Procedure: THROMBECTOMY OF ARTERIOVENOUS FISTULA  AND REVISION OF  ARTERIOVENOUS FISTULA BANDING.;  Surgeon: Conrad Gamewell, MD;  Location: [facility];  Service: Vascular;  Laterality: Left;    Family History  Problem Relation Age of Onset  . Emphysema Mother   . COPD Mother   . Colon cancer Father   . Diabetes Father   . Cancer Father     colon  . Hypertension Father   . Congestive Heart Failure  Maternal Grandmother   . Cancer Paternal Grandmother     Social History:  reports that she has never smoked. She has never used smokeless tobacco. She reports that she does not drink alcohol or use illicit drugs.  Allergies:  Allergies  Allergen Reactions  . Latex Shortness Of Breath and Swelling    Facial swelling  . Vancomycin Shortness Of Breath  . Vancomycin Hcl Swelling    REACTION: facial swelling  . Quinapril Hcl Swelling and Rash    Arm swelling    Medications: I have reviewed the patient's current medications.  Results for orders placed during the hospital encounter of 10/22/13 (from the past 48 hour(s))  CBC WITH DIFFERENTIAL     Status: Abnormal   Collection Time    10/22/13  6:27 PM      Result Value Ref Range   WBC 17.6 (*) 4.0 - 10.5 K/uL   RBC 4.17  3.87 - 5.11 MIL/uL   Hemoglobin 11.9 (*) 12.0 - 15.0 g/dL   HCT 35.9 (*) 36.0 - 46.0 %   MCV 86.1  78.0 - 100.0 fL   MCH 28.5  26.0 - 34.0 pg   MCHC 33.1  30.0 - 36.0 g/dL   RDW 14.6  11.5 - 15.5 %   Platelets 340  150 - 400 K/uL   Neutrophils Relative % 90 (*) 43 - 77 %   Neutro Abs 15.8 (*) 1.7 - 7.7 K/uL   Lymphocytes Relative 7 (*) 12 - 46 %   Lymphs Abs 1.2  0.7 - 4.0 K/uL   Monocytes Relative 3  3 - 12 %   Monocytes Absolute 0.6  0.1 - 1.0 K/uL   Eosinophils Relative 0  0 - 5 %   Eosinophils Absolute 0.0  0.0 - 0.7 K/uL   Basophils Relative 0  0 - 1 %   Basophils Absolute 0.0  0.0 - 0.1 K/uL  BASIC METABOLIC PANEL     Status: Abnormal   Collection Time    10/22/13  6:27 PM      Result Value Ref Range   Sodium 135 (*) 137 - 147 mEq/L   Potassium 3.9  3.7 - 5.3 mEq/L   Chloride 98  96 - 112 mEq/L   CO2 23  19 - 32 mEq/L   Glucose, Bld 345 (*) 70 - 99 mg/dL   BUN 27 (*) 6 - 23 mg/dL   Creatinine, Ser 1.67 (*) 0.50 - 1.10 mg/dL   Calcium 9.0  8.4 - 10.5 mg/dL   GFR calc non Af Amer 31 (*) >90 mL/min   GFR calc Af Amer 36 (*) >90 mL/min   Comment: (NOTE)  The eGFR has been calculated using the  CKD EPI equation.     This calculation has not been validated in all clinical situations.     eGFR's persistently <90 mL/min signify possible Chronic Kidney     Disease.   Anion gap 14  5 - 15  PRO B NATRIURETIC PEPTIDE     Status: Abnormal   Collection Time    10/22/13  6:27 PM      Result Value Ref Range   Pro B Natriuretic peptide (BNP) 2668.0 (*) 0 - 125 pg/mL  HEPATIC FUNCTION PANEL     Status: Abnormal   Collection Time    10/22/13  8:07 PM      Result Value Ref Range   Total Protein 7.6  6.0 - 8.3 g/dL   Albumin 3.2 (*) 3.5 - 5.2 g/dL   AST 34  0 - 37 U/L   ALT 32  0 - 35 U/L   Alkaline Phosphatase 131 (*) 39 - 117 U/L   Total Bilirubin 0.3  0.3 - 1.2 mg/dL   Bilirubin, Direct <0.2  0.0 - 0.3 mg/dL   Indirect Bilirubin NOT CALCULATED  0.3 - 0.9 mg/dL  LIPASE, BLOOD     Status: None   Collection Time    10/22/13  8:07 PM      Result Value Ref Range   Lipase 13  11 - 59 U/L  URINALYSIS, ROUTINE W REFLEX MICROSCOPIC     Status: Abnormal   Collection Time    10/22/13  8:53 PM      Result Value Ref Range   Color, Urine YELLOW  YELLOW   APPearance CLEAR  CLEAR   Specific Gravity, Urine 1.020  1.005 - 1.030   pH 6.0  5.0 - 8.0   Glucose, UA 500 (*) NEGATIVE mg/dL   Hgb urine dipstick TRACE (*) NEGATIVE   Bilirubin Urine NEGATIVE  NEGATIVE   Ketones, ur NEGATIVE  NEGATIVE mg/dL   Protein, ur 100 (*) NEGATIVE mg/dL   Urobilinogen, UA 1.0  0.0 - 1.0 mg/dL   Nitrite NEGATIVE  NEGATIVE   Leukocytes, UA NEGATIVE  NEGATIVE  URINE MICROSCOPIC-ADD ON     Status: None   Collection Time    10/22/13  8:53 PM      Result Value Ref Range   Squamous Epithelial / LPF RARE  RARE   WBC, UA 0-2  <3 WBC/hpf   RBC / HPF 0-2  <3 RBC/hpf  SURGICAL PCR SCREEN     Status: None   Collection Time    10/23/13  2:28 AM      Result Value Ref Range   MRSA, PCR NEGATIVE  NEGATIVE   Staphylococcus aureus NEGATIVE  NEGATIVE   Comment:            The Xpert SA Assay (FDA     approved for NASAL  specimens     in patients over 38 years of age),     is one component of     a comprehensive surveillance     program.  Test performance has     been validated by [employer] for patients greater     than or equal to 66 year old.     It is not intended     to diagnose infection nor to     guide or monitor treatment.  GLUCOSE, CAPILLARY     Status: Abnormal   Collection Time    10/23/13  2:41  AM      Result Value Ref Range   Glucose-Capillary 210 (*) 70 - 99 mg/dL   Comment 1 Documented in Chart     Comment 2 Notify RN    CBC     Status: Abnormal   Collection Time    10/23/13  3:00 AM      Result Value Ref Range   WBC 19.2 (*) 4.0 - 10.5 K/uL   RBC 4.02  3.87 - 5.11 MIL/uL   Hemoglobin 11.1 (*) 12.0 - 15.0 g/dL   HCT 34.4 (*) 36.0 - 46.0 %   MCV 85.6  78.0 - 100.0 fL   MCH 27.6  26.0 - 34.0 pg   MCHC 32.3  30.0 - 36.0 g/dL   RDW 14.6  11.5 - 15.5 %   Platelets 349  150 - 400 K/uL  COMPREHENSIVE METABOLIC PANEL     Status: Abnormal   Collection Time    10/23/13  3:00 AM      Result Value Ref Range   Sodium 137  137 - 147 mEq/L   Potassium 4.5  3.7 - 5.3 mEq/L   Comment: HEMOLYSIS AT THIS LEVEL MAY AFFECT RESULT     MODERATE HEMOLYSIS   Chloride 97  96 - 112 mEq/L   CO2 24  19 - 32 mEq/L   Glucose, Bld 199 (*) 70 - 99 mg/dL   BUN 28 (*) 6 - 23 mg/dL   Creatinine, Ser 1.55 (*) 0.50 - 1.10 mg/dL   Calcium 8.9  8.4 - 10.5 mg/dL   Total Protein 7.5  6.0 - 8.3 g/dL   Albumin 3.2 (*) 3.5 - 5.2 g/dL   AST 42 (*) 0 - 37 U/L   Comment: HEMOLYSIS AT THIS LEVEL MAY AFFECT RESULT     MODERATE HEMOLYSIS   ALT 33  0 - 35 U/L   Comment: HEMOLYSIS AT THIS LEVEL MAY AFFECT RESULT     MODERATE HEMOLYSIS   Alkaline Phosphatase 113  39 - 117 U/L   Comment: HEMOLYSIS AT THIS LEVEL MAY AFFECT RESULT     MODERATE HEMOLYSIS   Total Bilirubin 0.4  0.3 - 1.2 mg/dL   GFR calc non Af Amer 34 (*) >90 mL/min   GFR calc Af Amer 40 (*) >90 mL/min   Comment: (NOTE)     The eGFR has been  calculated using the CKD EPI equation.     This calculation has not been validated in all clinical situations.     eGFR's persistently <90 mL/min signify possible Chronic Kidney     Disease.   Anion gap 16 (*) 5 - 15    Ct Abdomen Pelvis Wo Contrast  10/22/2013   CLINICAL DATA:  Chest pain and shortness of breath, abdominal pain  EXAM: CT ABDOMEN AND PELVIS WITHOUT CONTRAST  TECHNIQUE: Multidetector CT imaging of the abdomen and pelvis was performed following the standard protocol without IV contrast.  COMPARISON:  None.  FINDINGS: Lung bases show mild atelectatic changes bilaterally.  The liver, spleen, adrenal glands and pancreas are within normal limits. The gallbladder is well distended and demonstrates some indistinctness of the wall which may be related wall thickening and wall edema. Kidneys are well visualized bilaterally but demonstrate some mild perinephric stranding. No obstructive changes are seen. No renal calculi are noted. Diverticular change is noted without diverticulitis. The appendix is within normal limits. The bladder is well distended. The uterus has been surgically removed. Degenerative changes of the lumbar spine are noted.  IMPRESSION:  Indistinctness of the gallbladder wall which may be related to gallbladder wall edema and inflammation. In the appropriate clinical setting this could represent acute cholecystitis. Ultrasound of the right upper quadrant may be helpful for further evaluation as necessary.   Electronically Signed   By: Inez Catalina M.D.   On: 10/22/2013 21:10   Dg Chest 2 View  10/22/2013   CLINICAL DATA:  Chronic shortness of breath, lower extremity swelling  EXAM: CHEST  2 VIEW  COMPARISON:  03/14/2013  FINDINGS: Pulmonary vascular congestion without frank interstitial edema. Mild bilateral lower lobe opacities, likely atelectasis. No pleural effusion or pneumothorax.  Cardiomegaly.  Mild degenerative changes of the visualized thoracolumbar spine.  IMPRESSION:  Pulmonary vascular congestion without frank interstitial edema.  Mild bilateral lower lobe opacities, likely atelectasis.   Electronically Signed   By: Julian Hy M.D.   On: 10/22/2013 18:16    Review of Systems  Cardiovascular: Positive for leg swelling.  Gastrointestinal: Positive for nausea and abdominal pain.  All other systems reviewed and are negative.  Blood pressure 141/61, pulse 66, temperature 98.2 F (36.8 C), temperature source Oral, resp. rate 18, height 5' 7"  (1.702 m), weight 242 lb 1 oz (109.8 kg), SpO2 93.00%. Physical Exam  Constitutional: She is oriented to person, place, and time. She appears well-developed. No distress.  obese  HENT:  Head: Normocephalic and atraumatic.  Right Ear: External ear normal.  Left Ear: External ear normal.  Nose: Nose normal.  Mouth/Throat: Oropharynx is clear and moist. No oropharyngeal exudate.  Eyes: Conjunctivae are normal. Pupils are equal, round, and reactive to light. Right eye exhibits no discharge. Left eye exhibits no discharge. No scleral icterus.  Neck: Normal range of motion. Neck supple. No tracheal deviation present. No thyromegaly present.  Cardiovascular: Normal rate, regular rhythm, normal heart sounds and intact distal pulses.   No murmur heard. Respiratory: Effort normal and breath sounds normal. No respiratory distress. She has no wheezes. She has no rales.  GI: Soft. She exhibits no distension. There is tenderness. There is guarding.  Obese.  She does have tenderness with guarding in the right midabdomen. This does not appear to be truly in the right upper quadrant. There are no hernias. The rest of the abdomen is soft and nontender  Musculoskeletal: Normal range of motion. She exhibits edema.  Lymphadenopathy:    She has no cervical adenopathy.  Neurological: She is alert and oriented to person, place, and time.  Skin: Skin is warm and dry. No rash noted. She is not diaphoretic. No erythema.  Psychiatric:  Her behavior is normal. Judgment normal.    Assessment/Plan: Right-sided abdominal pain of uncertain etiology  She certainly may have cholecystitis based on her white blood count and abdominal pain. Her tenderness does seem slightly lower to be the gallbladder. A noncontrasted CAT scan was not fairly helpful. I am going to order an ultrasound of her gallbladder to see if indeed there are signs consistent with cholecystitis. If there are, she'll need a laparoscopic cholecystectomy. If the ultrasound is unremarkable, she may need a HIDA scan or a CAT scan of the abdomen and pelvis with at least oral contrast. We will follow her closely with you. She will remain n.p.o. for now  [facility] A 10/23/2013, 6:28 AM

## 2013-10-23 NOTE — Progress Notes (Signed)
Pt admitted on unit from [facility], is alert and oriented. RN educated on call light use. Bed alarm is on.  Charlynn Grimes, RN

## 2013-10-23 NOTE — Progress Notes (Signed)
63yo female c/o BLE swelling, SOB, and RUQ abdominal pain; CT reveals possible acute cholecystitis, also w/ leukocytosis, to begin IV ABX.  Will start Zosyn 3.375g IV Q8H for CrCl ~40 ml/min and monitor CBC and Cx.  Wynona Neat, PharmD, BCPS 10/23/2013 2:37 AM

## 2013-10-23 NOTE — ED Notes (Signed)
Patient states that she is having pain again. States that she has asked for pain medication and no one returned to room. Assisted patient to restroom at this time.

## 2013-10-23 NOTE — Progress Notes (Signed)
PATIENT DETAILS Name: [name] Age: 63 y.o. Sex: female Date of Birth: 01/25/1951 Admit Date: 10/22/2013 Admitting Physician Oswald Hillock, MD [id number], Elenore Rota, MD  Subjective: RUQ pain continues.  Assessment/Plan: Principal Problem:   Suspected Cholecystitis -patient initially evaluated at [facility]-transferred to [facility] evaluation.Started empirically on Zosyn-day 2 -Seen by [facility]-abdominal ultrasound ordered-no clear cut signs of cholecystitis. Will go ahead and get a HIDA scan. Continue with NPO status for now.   Active Problems:  DM-2 -since NPO-decrease Lantus to 30 units, check CBG's every 4 hours till diet resumed -follow CBG's closely    Hypothyroidism -c/w Levothyroxine    Hypertension -controlled, c/w Coreg,Amlodipine    RLS (restless legs syndrome) -stable-c/w Requip    Chronic kidney disease, stage 3 -at baseline-continue to monitory lytes periodically   Dyslipidemia -c/w Statins   COPD -stable for now-lungs clear-c/w Dulera-prn Bebs  Disposition: Remain inpatient  DVT Prophylaxis: Prophylactic Heparin   Code Status: Full code   Family Communication None at bedside  Procedures:  None  CONSULTS:  general surgery  Time spent 40 minutes-which includes 50% of the time with face-to-face with patient/ family and coordinating care related to the above assessment and plan.   MEDICATIONS: Scheduled Meds: . allopurinol  100 mg Oral q morning - 10a  . amLODipine  5 mg Oral Daily  . carvedilol  25 mg Oral BID WC  . citalopram  40 mg Oral Daily  . gabapentin  1,600 mg Oral QHS  . heparin  5,000 Units Subcutaneous 3 times per day  . insulin aspart  0-20 Units Subcutaneous TID WC  . insulin glargine  30 Units Subcutaneous QHS  . insulin glargine  60 Units Subcutaneous Daily  . levothyroxine  50 mcg Oral QPM  . mometasone-formoterol  2 puff Inhalation BID  . piperacillin-tazobactam (ZOSYN)  IV  3.375 g Intravenous Q8H  .  rOPINIRole  1 mg Oral QHS  . simvastatin  20 mg Oral q1800  . sodium bicarbonate  650 mg Oral TID  . sodium chloride  3 mL Intravenous Q12H  . tiotropium  18 mcg Inhalation q morning - 10a   Continuous Infusions:  PRN Meds:.sodium chloride, albuterol, morphine injection, ondansetron (ZOFRAN) IV, ondansetron, sodium chloride  Antibiotics: Anti-infectives   Start     Dose/Rate Route Frequency Ordered Stop   10/23/13 0800  piperacillin-tazobactam (ZOSYN) IVPB 3.375 g     3.375 g 12.5 mL/hr over 240 Minutes Intravenous Every 8 hours 10/23/13 0232     10/22/13 2245  piperacillin-tazobactam (ZOSYN) IVPB 3.375 g     3.375 g 12.5 mL/hr over 240 Minutes Intravenous  Once 10/22/13 2236 10/23/13 0356       PHYSICAL EXAM: Vital signs in last 24 hours: Filed Vitals:   10/23/13 0520 10/23/13 0917 10/23/13 1222 10/23/13 1301  BP: 141/61  152/60 162/71  Pulse: 66   61  Temp: 98.2 F (36.8 C)   97.9 F (36.6 C)  TempSrc: Oral   Oral  Resp: 18   16  Height:      Weight:      SpO2: 93% 93%  92%    Weight change:  Filed Weights   10/22/13 1754 10/23/13 0207  Weight: 108.863 kg (240 lb) 109.8 kg (242 lb 1 oz)   Body mass index is 37.9 kg/(m^2).   Gen Exam: Awake and alert with clear speech.   Neck: Supple, No JVD.   Chest: B/L Clear.  No  rales CVS: S1 S2 Regular, no murmurs.  Abdomen: soft, BS +, Tender in RUQ area, non distended.  Extremities: no edema, lower extremities warm to touch. Neurologic: Non Focal.   Skin: No Rash.   Wounds: N/A.    Intake/Output from previous day: No intake or output data in the 24 hours ending 10/23/13 1351   LAB RESULTS: CBC  Recent Labs Lab 10/22/13 1827 10/23/13 0300  WBC 17.6* 19.2*  HGB 11.9* 11.1*  HCT 35.9* 34.4*  PLT 340 349  MCV 86.1 85.6  MCH 28.5 27.6  MCHC 33.1 32.3  RDW 14.6 14.6  LYMPHSABS 1.2  --   MONOABS 0.6  --   EOSABS 0.0  --   BASOSABS 0.0  --     Chemistries   Recent Labs Lab 10/22/13 1827  10/23/13 0300  NA 135* 137  K 3.9 4.5  CL 98 97  CO2 23 24  GLUCOSE 345* 199*  BUN 27* 28*  CREATININE 1.67* 1.55*  CALCIUM 9.0 8.9    CBG:  Recent Labs Lab 10/23/13 0241 10/23/13 0816 10/23/13 1158  GLUCAP 210* 140* 99    GFR Estimated Creatinine Clearance: 46.8 ml/min (by C-G formula based on Cr of 1.55).  Coagulation profile No results found for this basename: INR, PROTIME,  in the last 168 hours  Cardiac Enzymes No results found for this basename: CK, CKMB, TROPONINI, MYOGLOBIN,  in the last 168 hours  No components found with this basename: POCBNP,  No results found for this basename: DDIMER,  in the last 72 hours No results found for this basename: HGBA1C,  in the last 72 hours No results found for this basename: CHOL, HDL, LDLCALC, TRIG, CHOLHDL, LDLDIRECT,  in the last 72 hours No results found for this basename: TSH, T4TOTAL, FREET3, T3FREE, THYROIDAB,  in the last 72 hours No results found for this basename: VITAMINB12, FOLATE, FERRITIN, TIBC, IRON, RETICCTPCT,  in the last 72 hours  Recent Labs  10/22/13 2007  LIPASE 13    Urine Studies No results found for this basename: UACOL, UAPR, USPG, UPH, UTP, UGL, UKET, UBIL, UHGB, UNIT, UROB, ULEU, UEPI, UWBC, URBC, UBAC, CAST, CRYS, UCOM, BILUA,  in the last 72 hours  MICROBIOLOGY: Recent Results (from the past 240 hour(s))  SURGICAL PCR SCREEN     Status: None   Collection Time    10/23/13  2:28 AM      Result Value Ref Range Status   MRSA, PCR NEGATIVE  NEGATIVE Final   Staphylococcus aureus NEGATIVE  NEGATIVE Final   Comment:            The Xpert SA Assay (FDA     approved for NASAL specimens     in patients over 30 years of age),     is one component of     a comprehensive surveillance     program.  Test performance has     been validated by [employer] for patients greater     than or equal to 74 year old.     It is not intended     to diagnose infection nor to     guide or monitor  treatment.    RADIOLOGY STUDIES/RESULTS: Ct Abdomen Pelvis Wo Contrast  10/22/2013   CLINICAL DATA:  Chest pain and shortness of breath, abdominal pain  EXAM: CT ABDOMEN AND PELVIS WITHOUT CONTRAST  TECHNIQUE: Multidetector CT imaging of the abdomen and pelvis was performed following the standard protocol without  IV contrast.  COMPARISON:  None.  FINDINGS: Lung bases show mild atelectatic changes bilaterally.  The liver, spleen, adrenal glands and pancreas are within normal limits. The gallbladder is well distended and demonstrates some indistinctness of the wall which may be related wall thickening and wall edema. Kidneys are well visualized bilaterally but demonstrate some mild perinephric stranding. No obstructive changes are seen. No renal calculi are noted. Diverticular change is noted without diverticulitis. The appendix is within normal limits. The bladder is well distended. The uterus has been surgically removed. Degenerative changes of the lumbar spine are noted.  IMPRESSION: Indistinctness of the gallbladder wall which may be related to gallbladder wall edema and inflammation. In the appropriate clinical setting this could represent acute cholecystitis. Ultrasound of the right upper quadrant may be helpful for further evaluation as necessary.   Electronically Signed   By: Inez Catalina M.D.   On: 10/22/2013 21:10   Dg Chest 2 View  10/22/2013   CLINICAL DATA:  Chronic shortness of breath, lower extremity swelling  EXAM: CHEST  2 VIEW  COMPARISON:  03/14/2013  FINDINGS: Pulmonary vascular congestion without frank interstitial edema. Mild bilateral lower lobe opacities, likely atelectasis. No pleural effusion or pneumothorax.  Cardiomegaly.  Mild degenerative changes of the visualized thoracolumbar spine.  IMPRESSION: Pulmonary vascular congestion without frank interstitial edema.  Mild bilateral lower lobe opacities, likely atelectasis.   Electronically Signed   By: Julian Hy M.D.   On:  10/22/2013 18:16   Mm Digital Diagnostic Bilat  10/18/2013   CLINICAL DATA:  Diffuse left breast pain.  EXAM: DIGITAL DIAGNOSTIC  bilateral MAMMOGRAM WITH CAD  COMPARISON:  12/31/2009, 12/27/2008.  ACR Breast Density Category b: There are scattered areas of fibroglandular density.  FINDINGS: There is no mass, distortion, or worrisome calcification within either breast. The parenchymal pattern is stable.  Mammographic images were processed with CAD.  IMPRESSION: No findings worrisome for malignancy. The patient's diffuse left breast pain is most likely on a hormonal basis.  RECOMMENDATION: Recommend bilateral screening mammography 1 year.  I have discussed the findings and recommendations with the patient. Results were also provided in writing at the conclusion of the visit. If applicable, a reminder letter will be sent to the patient regarding the next appointment.  BI-RADS CATEGORY  1: Negative.   Electronically Signed   By: Luberta Robertson M.D.   On: 10/18/2013 15:04   US Abdomen Limited Ruq  10/23/2013   CLINICAL DATA:  Right upper quadrant pain  EXAM: US ABDOMEN LIMITED - RIGHT UPPER QUADRANT  COMPARISON:  None.  FINDINGS: Gallbladder:  Mild gallbladder wall thickening/ edema with trace pericholecystic fluid. No gallstones. Negative sonographic Murphy's sign.  Common bile duct:  Diameter: 4 mm.  Liver:  Hyperechoic hepatic parenchyma, suggesting hepatic steatosis.  IMPRESSION: Mild gallbladder wall thickening/edema, nonspecific, but without cholelithiasis or associated sonographic findings to suggest acute cholecystitis.  Suspected hepatic steatosis.   Electronically Signed   By: Julian Hy M.D.   On: 10/23/2013 12:06    Oren Binet, MD  [facility] Pager:[phone]  If 7PM-7AM, please contact night-coverage [email or web address] Password [email or web address] 10/23/2013, 1:51 PM   LOS: 1 day   **Disclaimer: This note may have been dictated with voice recognition software. Similar sounding words can  inadvertently be transcribed and this note may contain transcription errors which may not have been corrected upon publication of note.**

## 2013-10-23 NOTE — Progress Notes (Signed)
Hypoglycemic Event  CBG: 62  Treatment: D50 IV 25 mL  Symptoms: hungry  Report given to night RN to reassess blood sugar at 7:20pm following treatment and asked to follow CBG's more frequently throughout night.    Delman Cheadle  Remember to initiate Hypoglycemia Order Set & complete

## 2013-10-24 ENCOUNTER — Inpatient Hospital Stay (HOSPITAL_COMMUNITY): Payer: Medicare HMO

## 2013-10-24 ENCOUNTER — Ambulatory Visit: Payer: Self-pay

## 2013-10-24 DIAGNOSIS — G2581 Restless legs syndrome: Secondary | ICD-10-CM

## 2013-10-24 DIAGNOSIS — D72829 Elevated white blood cell count, unspecified: Secondary | ICD-10-CM

## 2013-10-24 LAB — GLUCOSE, CAPILLARY
GLUCOSE-CAPILLARY: 58 mg/dL — AB (ref 70–99)
GLUCOSE-CAPILLARY: 150 mg/dL — AB (ref 70–99)
Glucose-Capillary: 126 mg/dL — ABNORMAL HIGH (ref 70–99)
Glucose-Capillary: 134 mg/dL — ABNORMAL HIGH (ref 70–99)
Glucose-Capillary: 156 mg/dL — ABNORMAL HIGH (ref 70–99)
Glucose-Capillary: 75 mg/dL (ref 70–99)
Glucose-Capillary: 79 mg/dL (ref 70–99)
Glucose-Capillary: 85 mg/dL (ref 70–99)
Glucose-Capillary: 87 mg/dL (ref 70–99)

## 2013-10-24 LAB — CBC
HCT: 38.3 % (ref 36.0–46.0)
HEMOGLOBIN: 12.4 g/dL (ref 12.0–15.0)
MCH: 28.6 pg (ref 26.0–34.0)
MCHC: 32.4 g/dL (ref 30.0–36.0)
MCV: 88.2 fL (ref 78.0–100.0)
Platelets: 334 10*3/uL (ref 150–400)
RBC: 4.34 MIL/uL (ref 3.87–5.11)
RDW: 15.2 % (ref 11.5–15.5)
WBC: 12.8 10*3/uL — AB (ref 4.0–10.5)

## 2013-10-24 LAB — COMPREHENSIVE METABOLIC PANEL
ALT: 36 U/L — ABNORMAL HIGH (ref 0–35)
AST: 28 U/L (ref 0–37)
Albumin: 3.2 g/dL — ABNORMAL LOW (ref 3.5–5.2)
Alkaline Phosphatase: 102 U/L (ref 39–117)
Anion gap: 15 (ref 5–15)
BILIRUBIN TOTAL: 0.5 mg/dL (ref 0.3–1.2)
BUN: 25 mg/dL — ABNORMAL HIGH (ref 6–23)
CHLORIDE: 105 meq/L (ref 96–112)
CO2: 26 meq/L (ref 19–32)
CREATININE: 1.53 mg/dL — AB (ref 0.50–1.10)
Calcium: 8.7 mg/dL (ref 8.4–10.5)
GFR, EST AFRICAN AMERICAN: 40 mL/min — AB (ref >90)
GFR, EST NON AFRICAN AMERICAN: 35 mL/min — AB (ref >90)
GLUCOSE: 70 mg/dL (ref 70–99)
Potassium: 3.6 mEq/L — ABNORMAL LOW (ref 3.7–5.3)
Sodium: 146 mEq/L (ref 137–147)
Total Protein: 7.4 g/dL (ref 6.0–8.3)

## 2013-10-24 MED ORDER — DEXTROSE-NACL 5-0.45 % IV SOLN
INTRAVENOUS | Status: DC
  Administered 2013-10-24 (×2): via INTRAVENOUS

## 2013-10-24 MED ORDER — SINCALIDE 5 MCG IJ SOLR
0.0200 ug/kg | Freq: Once | INTRAMUSCULAR | Status: AC
  Administered 2013-10-24: 2.2 ug via INTRAVENOUS
  Filled 2013-10-24: qty 5

## 2013-10-24 MED ORDER — INSULIN GLARGINE 100 UNIT/ML SOLOSTAR PEN: 30.0000 [IU] | PEN_INJECTOR | Freq: Two times a day (BID) | SUBCUTANEOUS | Status: DC

## 2013-10-24 MED ORDER — SINCALIDE 5 MCG IJ SOLR
INTRAMUSCULAR | Status: AC
  Administered 2013-10-24: 2.2 ug via INTRAVENOUS
  Filled 2013-10-24: qty 5

## 2013-10-24 MED ORDER — SINCALIDE 5 MCG IJ SOLR
INTRAMUSCULAR | Status: AC
  Filled 2013-10-24: qty 5

## 2013-10-24 MED ORDER — DEXTROSE 50 % IV SOLN: 25.0000 mL | Freq: Once | INTRAVENOUS | Status: AC | PRN

## 2013-10-24 MED ORDER — TECHNETIUM TC 99M MEBROFENIN IV KIT
5.0000 | PACK | Freq: Once | INTRAVENOUS | Status: AC | PRN
  Administered 2013-10-24: 5 via INTRAVENOUS

## 2013-10-24 MED ORDER — STERILE WATER FOR INJECTION IJ SOLN
INTRAMUSCULAR | Status: AC
  Filled 2013-10-24: qty 10

## 2013-10-24 MED ORDER — CITALOPRAM HYDROBROMIDE 20 MG PO TABS
20.0000 mg | ORAL_TABLET | Freq: Every day | ORAL | Status: DC
  Administered 2013-10-24 – 2013-10-25 (×2): 20 mg via ORAL
  Filled 2013-10-24 (×2): qty 1

## 2013-10-24 MED ORDER — GI COCKTAIL ~~LOC~~
30.0000 mL | Freq: Two times a day (BID) | ORAL | Status: DC | PRN
  Filled 2013-10-24: qty 30

## 2013-10-24 MED ORDER — DEXTROSE 50 % IV SOLN
50.0000 mL | Freq: Once | INTRAVENOUS | Status: AC | PRN
  Administered 2013-10-24: 50 mL via INTRAVENOUS

## 2013-10-24 MED ORDER — INSULIN GLARGINE 100 UNIT/ML ~~LOC~~ SOLN
30.0000 [IU] | Freq: Two times a day (BID) | SUBCUTANEOUS | Status: DC
  Administered 2013-10-24 – 2013-10-25 (×2): 30 [IU] via SUBCUTANEOUS
  Filled 2013-10-24 (×3): qty 0.3

## 2013-10-24 MED ORDER — INSULIN ASPART 100 UNIT/ML ~~LOC~~ SOLN
0.0000 [IU] | Freq: Three times a day (TID) | SUBCUTANEOUS | Status: DC
  Administered 2013-10-24: 1 [IU] via SUBCUTANEOUS
  Administered 2013-10-25: 2 [IU] via SUBCUTANEOUS
  Administered 2013-10-25: 1 [IU] via SUBCUTANEOUS

## 2013-10-24 MED ORDER — PANTOPRAZOLE SODIUM 40 MG PO TBEC
40.0000 mg | DELAYED_RELEASE_TABLET | Freq: Every day | ORAL | Status: DC
  Administered 2013-10-24 – 2013-10-25 (×2): 40 mg via ORAL
  Filled 2013-10-24 (×2): qty 1

## 2013-10-24 NOTE — Progress Notes (Signed)
CT order to give contrast drinks at 1815 and 1915 today. No orders populated in [facility]. Order to contact CT when patient completes drinks.

## 2013-10-24 NOTE — Progress Notes (Signed)
[facility] Progress Note     Subjective: Pt doing better, but still having pain in RUQ/epigastrium.  Mild nausea.  Well controlled with meds.  Ambulating some.  Hasn't had a BM in a couple days, having flatus.    Objective: Vital signs in last 24 hours: Temp:  [97.7 F (36.5 C)-97.9 F (36.6 C)] 97.9 F (36.6 C) (08/03 0415) Pulse Rate:  [52-61] 56 (08/03 0415) Resp:  [16] 16 (08/03 0415) BP: (142-162)/(60-74) 146/72 mmHg (08/03 0415) SpO2:  [92 %-94 %] 94 % (08/03 0415) Last BM Date: 10/22/13  Intake/Output from previous day: 08/02 0701 - 08/03 0700 In: 870 [I.V.:820; IV Piggyback:50] Out: -  Intake/Output this shift:    PE: Gen:  Alert, NAD, pleasant Abd: Soft, mild distension, mild tenderness in epigastrium/RUQ, +BS, no HSM   Lab Results:   Recent Labs  10/23/13 0300 10/24/13 0642  WBC 19.2* 12.8*  HGB 11.1* 12.4  HCT 34.4* 38.3  PLT 349 334   BMET  Recent Labs  10/23/13 0300 10/24/13 0642  NA 137 146  K 4.5 3.6*  CL 97 105  CO2 24 26  GLUCOSE 199* 70  BUN 28* 25*  CREATININE 1.55* 1.53*  CALCIUM 8.9 8.7   PT/INR No results found for this basename: LABPROT, INR,  in the last 72 hours CMP     Component Value Date/Time   NA 146 10/24/2013 0642   NA 132* 09/30/2013 1515   K 3.6* 10/24/2013 0642   CL 105 10/24/2013 0642   CO2 26 10/24/2013 0642   GLUCOSE 70 10/24/2013 0642   GLUCOSE 451* 09/30/2013 1515   BUN 25* 10/24/2013 0642   BUN 25 09/30/2013 1515   CREATININE 1.53* 10/24/2013 0642   CREATININE 1.84* 09/30/2012 1238   CREATININE 1.81* 05/15/2008 1046   CALCIUM 8.7 10/24/2013 0642   PROT 7.4 10/24/2013 0642   PROT 7.2 09/30/2013 1515   ALBUMIN 3.2* 10/24/2013 0642   AST 28 10/24/2013 0642   ALT 36* 10/24/2013 0642   ALKPHOS 102 10/24/2013 0642   BILITOT 0.5 10/24/2013 0642   GFRNONAA 35* 10/24/2013 0642   GFRNONAA 29* 09/30/2012 1238   GFRAA 40* 10/24/2013 0642   GFRAA 33* 09/30/2012 1238   Lipase     Component Value Date/Time   LIPASE 13 10/22/2013  2007       Studies/Results: Ct Abdomen Pelvis Wo Contrast  10/22/2013   CLINICAL DATA:  Chest pain and shortness of breath, abdominal pain  EXAM: CT ABDOMEN AND PELVIS WITHOUT CONTRAST  TECHNIQUE: Multidetector CT imaging of the abdomen and pelvis was performed following the standard protocol without IV contrast.  COMPARISON:  None.  FINDINGS: Lung bases show mild atelectatic changes bilaterally.  The liver, spleen, adrenal glands and pancreas are within normal limits. The gallbladder is well distended and demonstrates some indistinctness of the wall which may be related wall thickening and wall edema. Kidneys are well visualized bilaterally but demonstrate some mild perinephric stranding. No obstructive changes are seen. No renal calculi are noted. Diverticular change is noted without diverticulitis. The appendix is within normal limits. The bladder is well distended. The uterus has been surgically removed. Degenerative changes of the lumbar spine are noted.  IMPRESSION: Indistinctness of the gallbladder wall which may be related to gallbladder wall edema and inflammation. In the appropriate clinical setting this could represent acute cholecystitis. Ultrasound of the right upper quadrant may be helpful for further evaluation as necessary.   Electronically Signed   By: Inez Catalina  M.D.   On: 10/22/2013 21:10   Dg Chest 2 View  10/22/2013   CLINICAL DATA:  Chronic shortness of breath, lower extremity swelling  EXAM: CHEST  2 VIEW  COMPARISON:  03/14/2013  FINDINGS: Pulmonary vascular congestion without frank interstitial edema. Mild bilateral lower lobe opacities, likely atelectasis. No pleural effusion or pneumothorax.  Cardiomegaly.  Mild degenerative changes of the visualized thoracolumbar spine.  IMPRESSION: Pulmonary vascular congestion without frank interstitial edema.  Mild bilateral lower lobe opacities, likely atelectasis.   Electronically Signed   By: Julian Hy M.D.   On: 10/22/2013 18:16    US Abdomen Limited Ruq  10/23/2013   CLINICAL DATA:  Right upper quadrant pain  EXAM: US ABDOMEN LIMITED - RIGHT UPPER QUADRANT  COMPARISON:  None.  FINDINGS: Gallbladder:  Mild gallbladder wall thickening/ edema with trace pericholecystic fluid. No gallstones. Negative sonographic Murphy's sign.  Common bile duct:  Diameter: 4 mm.  Liver:  Hyperechoic hepatic parenchyma, suggesting hepatic steatosis.  IMPRESSION: Mild gallbladder wall thickening/edema, nonspecific, but without cholelithiasis or associated sonographic findings to suggest acute cholecystitis.  Suspected hepatic steatosis.   Electronically Signed   By: Julian Hy M.D.   On: 10/23/2013 12:06    Anti-infectives: Anti-infectives   Start     Dose/Rate Route Frequency Ordered Stop   10/23/13 0800  piperacillin-tazobactam (ZOSYN) IVPB 3.375 g     3.375 g 12.5 mL/hr over 240 Minutes Intravenous Every 8 hours 10/23/13 0232     10/22/13 2245  piperacillin-tazobactam (ZOSYN) IVPB 3.375 g     3.375 g 12.5 mL/hr over 240 Minutes Intravenous  Once 10/22/13 2236 10/23/13 0356       Assessment/Plan Right sided abdominal pain of uncertain etiology Leukocytosis - resolved  Plan: 1.  Obtain HIDA to rule out or confirm cholecystitis and need for lap chole 2.  NPO, IVF, pain control, antiemetics 3.  If HIDA positive hopefully lap chole tomorrow 4.  Ambulate and IS 5.  SCD's and heparin    LOS: 2 days    Coralie Keens 10/24/2013, 8:33 AM Pager: [phone]

## 2013-10-24 NOTE — Progress Notes (Signed)
Hypoglycemic Event **  CBG 65  Treatment: D50 IV 25 mL  Symptoms: None  Follow-up CBG: Time:0013 CBG Result:126  Possible Reasons for Event: Other: pt is npo  Comments/MD notified:Paged to Lamar Blinks, NP with blood sugar result and asked if we can change IV fluid until the blood sugar remains consistently normal or higher.   Charlynn Grimes  Remember to initiate Hypoglycemia Order Set & complete

## 2013-10-24 NOTE — Progress Notes (Signed)
NT made RN aware of CBG of 58 and symptomatic. Dr. Sloan Leiter made aware. Verbal order to give D50 IV 51ml. Given at 0807. CBG 156 rechecked at 8:40

## 2013-10-24 NOTE — Progress Notes (Signed)
PATIENT DETAILS Name: [name] Age: 63 y.o. Sex: female Date of Birth: 27-Nov-1950 Admit Date: 10/22/2013 Admitting Physician Oswald Hillock, MD [id number], Elenore Rota, MD  Subjective: Doing better with less pain.  Still has pain in RUQ and epigastrium.  Denies N/V.  No BM but passing flatus.    Assessment/Plan:  Principal Problem:  Suspected Cholecystitis  -patient initially evaluated at [facility]-transferred to [facility] for Surgery evaluation.Started empirically on Zosyn-day 3 -seen by [facility]-abdominal ultrasound ordered-no clear cut signs of cholecystitis. -HIDA scan today.  If positive, Lap Chole will be scheduled. -continue with NPO status for now.   Active Problems:  DM-2 with Hypoglycemia -since NPO-decreased Lantus to 30 units on 8/3-however continues to be hypoglycemic-hence will stop Lantus for now,continue with D5 0.45 NS, and place on sensitive SSI. Once oral intake resumed and/or CBG's more stable, will  Start basal insulin. Check CBG's every 4 hours.  Hypothyroidism  -c/w Levothyroxine   Hypertension  -controlled, c/w Coreg,Amlodipine   RLS (restless legs syndrome)  -stable-c/w Requip   Chronic kidney disease, stage 3  -at baseline-continue to monitory lytes periodically   Dyslipidemia  -c/w Statins   COPD  -stable for now-lungs clear-c/w Dulera-prn Bebs   Disposition:  Remain inpatient   DVT Prophylaxis:  Prophylactic Heparin   Code Status:  Full code   Family Communication  None at bedside   Procedures:  None   CONSULTS:  general surgery   MEDICATIONS: Scheduled Meds: . allopurinol  100 mg Oral q morning - 10a  . amLODipine  5 mg Oral Daily  . antiseptic oral rinse  7 mL Mouth Rinse BID  . carvedilol  25 mg Oral BID WC  . chlorhexidine  15 mL Mouth Rinse BID  . citalopram  40 mg Oral Daily  . gabapentin  1,600 mg Oral QHS  . heparin  5,000 Units Subcutaneous 3 times per day  . insulin aspart  0-20 Units Subcutaneous TID WC  . insulin  glargine  30 Units Subcutaneous Daily  . levothyroxine  50 mcg Oral QPM  . mometasone-formoterol  2 puff Inhalation BID  . piperacillin-tazobactam (ZOSYN)  IV  3.375 g Intravenous Q8H  . rOPINIRole  1 mg Oral QHS  . simvastatin  20 mg Oral q1800  . sodium bicarbonate  650 mg Oral TID  . tiotropium  18 mcg Inhalation q morning - 10a   Continuous Infusions: . dextrose 5 % and 0.45% NaCl 75 mL/hr at 10/24/13 0600   PRN Meds:.albuterol, dextrose, morphine injection, ondansetron (ZOFRAN) IV, ondansetron  Antibiotics: Anti-infectives   Start     Dose/Rate Route Frequency Ordered Stop   10/23/13 0800  piperacillin-tazobactam (ZOSYN) IVPB 3.375 g     3.375 g 12.5 mL/hr over 240 Minutes Intravenous Every 8 hours 10/23/13 0232     10/22/13 2245  piperacillin-tazobactam (ZOSYN) IVPB 3.375 g     3.375 g 12.5 mL/hr over 240 Minutes Intravenous  Once 10/22/13 2236 10/23/13 0356       PHYSICAL EXAM: Vital signs in last 24 hours: Filed Vitals:   10/23/13 1222 10/23/13 1301 10/23/13 2118 10/24/13 0415  BP: 152/60 162/71 142/74 146/72  Pulse:  61 52 56  Temp:  97.9 F (36.6 C) 97.7 F (36.5 C) 97.9 F (36.6 C)  TempSrc:  Oral Oral Oral  Resp:  16 16 16   Height:      Weight:      SpO2:  92% 93% 94%    Weight  change:  Filed Weights   10/22/13 1754 10/23/13 0207  Weight: 108.863 kg (240 lb) 109.8 kg (242 lb 1 oz)   Body mass index is 37.9 kg/(m^2).   Gen Exam: Awake and alert with clear speech.  Neck: Supple, No JVD.  Chest: B/L Clear. No rales, crackles or wheezes noted CVS: S1 S2 Regular, no murmurs, gallops, or rubs Abdomen: soft, BS +, Tender in RUQ area and epigastric, non distended.  Extremities: 1+ pitting edema noted bilateral legs, lower extremities warm to touch.  Neurologic: Non Focal.  Skin: No Rash.  Wounds: N/A.    Intake/Output from previous day:  Intake/Output Summary (Last 24 hours) at 10/24/13 0812 Last data filed at 10/24/13 0600  Gross per 24 hour    Intake    870 ml  Output      0 ml  Net    870 ml     LAB RESULTS: CBC  Recent Labs Lab 10/22/13 1827 10/23/13 0300 10/24/13 0642  WBC 17.6* 19.2* 12.8*  HGB 11.9* 11.1* 12.4  HCT 35.9* 34.4* 38.3  PLT 340 349 334  MCV 86.1 85.6 88.2  MCH 28.5 27.6 28.6  MCHC 33.1 32.3 32.4  RDW 14.6 14.6 15.2  LYMPHSABS 1.2  --   --   MONOABS 0.6  --   --   EOSABS 0.0  --   --   BASOSABS 0.0  --   --     Chemistries   Recent Labs Lab 10/22/13 1827 10/23/13 0300 10/24/13 0642  NA 135* 137 146  K 3.9 4.5 3.6*  CL 98 97 105  CO2 23 24 26   GLUCOSE 345* 199* 70  BUN 27* 28* 25*  CREATININE 1.67* 1.55* 1.53*  CALCIUM 9.0 8.9 8.7    CBG:  Recent Labs Lab 10/24/13 0014 10/24/13 0211 10/24/13 0407 10/24/13 0612 10/24/13 0749  GLUCAP 126* 85 79 75 58*    GFR Estimated Creatinine Clearance: 47.4 ml/min (by C-G formula based on Cr of 1.53).   Recent Labs  10/22/13 2007  LIPASE 13    Urine Studies No results found for this basename: UACOL, UAPR, USPG, UPH, UTP, UGL, UKET, UBIL, UHGB, UNIT, UROB, ULEU, UEPI, UWBC, URBC, UBAC, CAST, CRYS, UCOM, BILUA,  in the last 72 hours  MICROBIOLOGY: Recent Results (from the past 240 hour(s))  SURGICAL PCR SCREEN     Status: None   Collection Time    10/23/13  2:28 AM      Result Value Ref Range Status   MRSA, PCR NEGATIVE  NEGATIVE Final   Staphylococcus aureus NEGATIVE  NEGATIVE Final   Comment:            The Xpert SA Assay (FDA     approved for NASAL specimens     in patients over 4 years of age),     is one component of     a comprehensive surveillance     program.  Test performance has     been validated by [employer] for patients greater     than or equal to 73 year old.     It is not intended     to diagnose infection nor to     guide or monitor treatment.    RADIOLOGY STUDIES/RESULTS: Ct Abdomen Pelvis Wo Contrast  10/22/2013   CLINICAL DATA:  Chest pain and shortness of breath, abdominal pain  EXAM:  CT ABDOMEN AND PELVIS WITHOUT CONTRAST  TECHNIQUE: Multidetector CT imaging of the  abdomen and pelvis was performed following the standard protocol without IV contrast.  COMPARISON:  None.  FINDINGS: Lung bases show mild atelectatic changes bilaterally.  The liver, spleen, adrenal glands and pancreas are within normal limits. The gallbladder is well distended and demonstrates some indistinctness of the wall which may be related wall thickening and wall edema. Kidneys are well visualized bilaterally but demonstrate some mild perinephric stranding. No obstructive changes are seen. No renal calculi are noted. Diverticular change is noted without diverticulitis. The appendix is within normal limits. The bladder is well distended. The uterus has been surgically removed. Degenerative changes of the lumbar spine are noted.  IMPRESSION: Indistinctness of the gallbladder wall which may be related to gallbladder wall edema and inflammation. In the appropriate clinical setting this could represent acute cholecystitis. Ultrasound of the right upper quadrant may be helpful for further evaluation as necessary.   Electronically Signed   By: Inez Catalina M.D.   On: 10/22/2013 21:10   Dg Chest 2 View  10/22/2013   CLINICAL DATA:  Chronic shortness of breath, lower extremity swelling  EXAM: CHEST  2 VIEW  COMPARISON:  03/14/2013  FINDINGS: Pulmonary vascular congestion without frank interstitial edema. Mild bilateral lower lobe opacities, likely atelectasis. No pleural effusion or pneumothorax.  Cardiomegaly.  Mild degenerative changes of the visualized thoracolumbar spine.  IMPRESSION: Pulmonary vascular congestion without frank interstitial edema.  Mild bilateral lower lobe opacities, likely atelectasis.   Electronically Signed   By: Julian Hy M.D.   On: 10/22/2013 18:16   Mm Digital Diagnostic Bilat  10/18/2013   CLINICAL DATA:  Diffuse left breast pain.  EXAM: DIGITAL DIAGNOSTIC  bilateral MAMMOGRAM WITH CAD   COMPARISON:  12/31/2009, 12/27/2008.  ACR Breast Density Category b: There are scattered areas of fibroglandular density.  FINDINGS: There is no mass, distortion, or worrisome calcification within either breast. The parenchymal pattern is stable.  Mammographic images were processed with CAD.  IMPRESSION: No findings worrisome for malignancy. The patient's diffuse left breast pain is most likely on a hormonal basis.  RECOMMENDATION: Recommend bilateral screening mammography 1 year.  I have discussed the findings and recommendations with the patient. Results were also provided in writing at the conclusion of the visit. If applicable, a reminder letter will be sent to the patient regarding the next appointment.  BI-RADS CATEGORY  1: Negative.   Electronically Signed   By: Luberta Robertson M.D.   On: 10/18/2013 15:04   US Abdomen Limited Ruq  10/23/2013   CLINICAL DATA:  Right upper quadrant pain  EXAM: US ABDOMEN LIMITED - RIGHT UPPER QUADRANT  COMPARISON:  None.  FINDINGS: Gallbladder:  Mild gallbladder wall thickening/ edema with trace pericholecystic fluid. No gallstones. Negative sonographic Murphy's sign.  Common bile duct:  Diameter: 4 mm.  Liver:  Hyperechoic hepatic parenchyma, suggesting hepatic steatosis.  IMPRESSION: Mild gallbladder wall thickening/edema, nonspecific, but without cholelithiasis or associated sonographic findings to suggest acute cholecystitis.  Suspected hepatic steatosis.   Electronically Signed   By: Julian Hy M.D.   On: 10/23/2013 12:06    Janifer Adie, Charna Archer  [facility] Pager:[phone]  If 7PM-7AM, please contact night-coverage [email or web address] Password [email or web address] 10/24/2013, 8:12 AM   LOS: 2 days   **Disclaimer: This note may have been dictated with voice recognition software. Similar sounding words can inadvertently be transcribed and this note may contain transcription errors which may not have been corrected upon publication of  note.**  Attending Patient was seen, examined,treatment plan was  discussed with the Physician extender. I have directly reviewed the clinical findings, lab, imaging studies and management of this patient in detail. I have made the necessary changes to the above noted documentation, and agree with the documentation, as recorded by the Physician extender.  Nena Alexander MD [facility].

## 2013-10-24 NOTE — Care Management Note (Signed)
    Page 1 of 1   10/25/2013     12:14:06 PM CARE MANAGEMENT NOTE 10/25/2013  Patient:  [name]   Account Number:  [id number]  Date Initiated:  10/24/2013  Documentation initiated by:  Tomi Bamberger  Subjective/Objective Assessment:   dx abd pain, ? cholecystitis, hypoglycemia  admit- from home     Action/Plan:   8/3- hida scan   Anticipated DC Date:  10/25/2013   Anticipated DC Plan:  [place]  CM consult      Choice offered to / List presented to:             Status of service:  Completed, signed off Medicare Important Message given?  YES (If response is "NO", the following Medicare IM given date fields will be blank) Date Medicare IM given:  10/25/2013 Medicare IM given by:  Tomi Bamberger Date Additional Medicare IM given:   Additional Medicare IM given by:    Discharge Disposition:  HOME/SELF CARE  Per UR Regulation:  Reviewed for med. necessity/level of care/duration of stay  If discussed at [place] of Stay Meetings, dates discussed:    Comments:

## 2013-10-24 NOTE — Progress Notes (Signed)
Discussed case with PA and reviewed imaging  Check HIDA to evaluate for cholecystitis  [name]. Redmond Pulling, MD, FACS General, Bariatric, & Minimally Invasive Surgery [facility], [place]

## 2013-10-25 LAB — COMPREHENSIVE METABOLIC PANEL
ALBUMIN: 2.9 g/dL — AB (ref 3.5–5.2)
ALT: 29 U/L (ref 0–35)
ANION GAP: 15 (ref 5–15)
AST: 19 U/L (ref 0–37)
Alkaline Phosphatase: 101 U/L (ref 39–117)
BILIRUBIN TOTAL: 0.5 mg/dL (ref 0.3–1.2)
BUN: 25 mg/dL — ABNORMAL HIGH (ref 6–23)
CHLORIDE: 101 meq/L (ref 96–112)
CO2: 24 mEq/L (ref 19–32)
Calcium: 8.4 mg/dL (ref 8.4–10.5)
Creatinine, Ser: 1.66 mg/dL — ABNORMAL HIGH (ref 0.50–1.10)
GFR calc Af Amer: 37 mL/min — ABNORMAL LOW (ref >90)
GFR calc non Af Amer: 32 mL/min — ABNORMAL LOW (ref >90)
Glucose, Bld: 159 mg/dL — ABNORMAL HIGH (ref 70–99)
POTASSIUM: 3.5 meq/L — AB (ref 3.7–5.3)
Sodium: 140 mEq/L (ref 137–147)
Total Protein: 6.7 g/dL (ref 6.0–8.3)

## 2013-10-25 LAB — GLUCOSE, CAPILLARY
Glucose-Capillary: 170 mg/dL — ABNORMAL HIGH (ref 70–99)
Glucose-Capillary: 142 mg/dL — ABNORMAL HIGH (ref 70–99)

## 2013-10-25 LAB — CBC
HEMATOCRIT: 36.5 % (ref 36.0–46.0)
Hemoglobin: 11.8 g/dL — ABNORMAL LOW (ref 12.0–15.0)
MCH: 28.3 pg (ref 26.0–34.0)
MCHC: 32.3 g/dL (ref 30.0–36.0)
MCV: 87.5 fL (ref 78.0–100.0)
Platelets: 335 10*3/uL (ref 150–400)
RBC: 4.17 MIL/uL (ref 3.87–5.11)
RDW: 15.1 % (ref 11.5–15.5)
WBC: 11.4 10*3/uL — ABNORMAL HIGH (ref 4.0–10.5)

## 2013-10-25 MED ORDER — OXYCODONE HCL 5 MG PO TABS: 5.0000 mg | ORAL_TABLET | Freq: Four times a day (QID) | ORAL | Status: DC | PRN

## 2013-10-25 MED ORDER — CIPROFLOXACIN HCL 500 MG PO TABS: 500.0000 mg | ORAL_TABLET | Freq: Two times a day (BID) | ORAL | Status: DC

## 2013-10-25 MED ORDER — PANTOPRAZOLE SODIUM 40 MG PO TBEC: 40.0000 mg | DELAYED_RELEASE_TABLET | Freq: Every day | ORAL | Status: DC

## 2013-10-25 MED ORDER — METRONIDAZOLE 500 MG PO TABS: 500.0000 mg | ORAL_TABLET | Freq: Three times a day (TID) | ORAL | Status: DC

## 2013-10-25 MED ORDER — METOCLOPRAMIDE HCL 5 MG PO TABS: 5.0000 mg | ORAL_TABLET | Freq: Three times a day (TID) | ORAL | Status: DC | PRN

## 2013-10-25 NOTE — Progress Notes (Signed)
Pt seen.   Radiological wu for gallbladder negative.  Agree with discharge If symptoms return rec GI medicine evaluation.  Mixed results with cholecystectomy when radiological workup negative  Leighton Ruff. Redmond Pulling, MD, FACS General, Bariatric, & Minimally Invasive Surgery [facility], [place]

## 2013-10-25 NOTE — Progress Notes (Signed)
[facility] Progress Note     Subjective: No N/V, abdominal pain resolved, but does feel full after eating.  Ambulating well.    Objective: Vital signs in last 24 hours: Temp:  [98 F (36.7 C)-98.7 F (37.1 C)] 98.7 F (37.1 C) (08/04 0416) Pulse Rate:  [59-66] 66 (08/04 0416) Resp:  [16-18] 18 (08/04 0416) BP: (127-152)/(71-82) 150/71 mmHg (08/04 0416) SpO2:  [92 %-96 %] 92 % (08/04 0416) Last BM Date: 10/24/13  Intake/Output from previous day: 08/03 0701 - 08/04 0700 In: 275 [I.V.:225; IV Piggyback:50] Out: -  Intake/Output this shift:    PE: Gen:  Alert, NAD, pleasant Abd: Soft, mild distensions, NT, +BS, no HSM   Lab Results:   Recent Labs  10/24/13 0642 10/25/13 0600  WBC 12.8* 11.4*  HGB 12.4 11.8*  HCT 38.3 36.5  PLT 334 335   BMET  Recent Labs  10/24/13 0642 10/25/13 0600  NA 146 140  K 3.6* 3.5*  CL 105 101  CO2 26 24  GLUCOSE 70 159*  BUN 25* 25*  CREATININE 1.53* 1.66*  CALCIUM 8.7 8.4   PT/INR No results found for this basename: LABPROT, INR,  in the last 72 hours CMP     Component Value Date/Time   NA 140 10/25/2013 0600   NA 132* 09/30/2013 1515   K 3.5* 10/25/2013 0600   CL 101 10/25/2013 0600   CO2 24 10/25/2013 0600   GLUCOSE 159* 10/25/2013 0600   GLUCOSE 451* 09/30/2013 1515   BUN 25* 10/25/2013 0600   BUN 25 09/30/2013 1515   CREATININE 1.66* 10/25/2013 0600   CREATININE 1.84* 09/30/2012 1238   CREATININE 1.81* 05/15/2008 1046   CALCIUM 8.4 10/25/2013 0600   PROT 6.7 10/25/2013 0600   PROT 7.2 09/30/2013 1515   ALBUMIN 2.9* 10/25/2013 0600   AST 19 10/25/2013 0600   ALT 29 10/25/2013 0600   ALKPHOS 101 10/25/2013 0600   BILITOT 0.5 10/25/2013 0600   GFRNONAA 32* 10/25/2013 0600   GFRNONAA 29* 09/30/2012 1238   GFRAA 37* 10/25/2013 0600   GFRAA 33* 09/30/2012 1238   Lipase     Component Value Date/Time   LIPASE 13 10/22/2013 2007       Studies/Results: Ct Abdomen Pelvis Wo Contrast  10/25/2013   CLINICAL DATA:  Right upper  quadrant pain  EXAM: CT ABDOMEN AND PELVIS WITHOUT CONTRAST  TECHNIQUE: Multidetector CT imaging of the abdomen and pelvis was performed following the standard protocol without IV contrast.  COMPARISON:  10/22/2013  FINDINGS: BODY WALL: Scattered medication injection sites.  LOWER CHEST: Trace pleural effusions, right more than left.  ABDOMEN/PELVIS:  Liver: No focal abnormality. There is unchanged mild fatty infiltration around the lower round ligament. Omental infarct in this location has been described in the literature.  Biliary: Unchanged infiltration of the pericholecystic fat. Cystic duct is patent per cholescintigraphy.  Pancreas: Unremarkable.  Spleen: Unremarkable.  Adrenals: Unremarkable.  Kidneys and ureters: Tiny left lower pole calculus. No hydronephrosis.  Bladder: Unremarkable.  Reproductive: Hysterectomy.  Negative adnexae.  Bowel: Distal colonic diverticulosis. Mid duodenal diverticulum. No active inflammation. No bowel obstruction. Negative appendix.  Retroperitoneum: No mass or adenopathy.  Peritoneum: Trace free fluid in the pelvis.  Vascular: No acute abnormality.  OSSEOUS: No acute abnormalities.  IMPRESSION: 1. Mild fat infiltration around the round ligament of the liver. Fat infarct can occur in this location. 2. Unchanged pericholecystic edema. 3. Trace bilateral pleural effusion.   Electronically Signed   By: Jorje Guild  M.D.   On: 10/25/2013 03:02   Nm Hepato W/eject Fract  10/24/2013   CLINICAL DATA:  Abdominal pain, evaluate for cholecystitis  EXAM: NUCLEAR MEDICINE HEPATOBILIARY IMAGING WITH GALLBLADDER EF  TECHNIQUE: Sequential images of the abdomen were obtained out to 60 minutes following intravenous administration of radiopharmaceutical. After slow intravenous infusion of 2.2 micrograms Cholecystokinin, gallbladder ejection fraction was determined.  RADIOPHARMACEUTICALS:  5 [place] 123XX123 Choletec  COMPARISON:  None  FINDINGS: There is immediate homogeneous uptake of  radiotracer in the liver. Filling of the gallbladder begins at 20 minutes. Radiotracer uptake is present in the small bowel at 15 minutes.  At the end of 1-hour, there is relatively good clearance of activity from the liver.  When gallbladder filling was complete, the patient was given an IV infusion of CCK. At 30 min the gallbladder ejection fraction measures 83.5%. At 30 min, normal ejection fraction is greater than 30%.  The patient did not experience symptoms during CCK infusion.  IMPRESSION: 1. Normal hepatobiliary scan. 2. Normal gallbladder ejection fraction.   Electronically Signed   By: Kathreen Devoid   On: 10/24/2013 12:38   US Abdomen Limited Ruq  10/23/2013   CLINICAL DATA:  Right upper quadrant pain  EXAM: US ABDOMEN LIMITED - RIGHT UPPER QUADRANT  COMPARISON:  None.  FINDINGS: Gallbladder:  Mild gallbladder wall thickening/ edema with trace pericholecystic fluid. No gallstones. Negative sonographic Murphy's sign.  Common bile duct:  Diameter: 4 mm.  Liver:  Hyperechoic hepatic parenchyma, suggesting hepatic steatosis.  IMPRESSION: Mild gallbladder wall thickening/edema, nonspecific, but without cholelithiasis or associated sonographic findings to suggest acute cholecystitis.  Suspected hepatic steatosis.   Electronically Signed   By: Julian Hy M.D.   On: 10/23/2013 12:06    Anti-infectives: Anti-infectives   Start     Dose/Rate Route Frequency Ordered Stop   10/23/13 0800  piperacillin-tazobactam (ZOSYN) IVPB 3.375 g     3.375 g 12.5 mL/hr over 240 Minutes Intravenous Every 8 hours 10/23/13 0232     10/22/13 2245  piperacillin-tazobactam (ZOSYN) IVPB 3.375 g     3.375 g 12.5 mL/hr over 240 Minutes Intravenous  Once 10/22/13 2236 10/23/13 0356       Assessment/Plan Right sided abdominal pain of uncertain etiology  Leukocytosis - resolved   Plan:  1. HIDA negative for cholecystitis and EF normal, thus no need for cholecystectomy during this admission 2. CT unremarkable for  evidence of cholecystitis.  B/l pleural effusions can cause pericholecystic fluid 3. Would recommend GI workup inpatient vs outpatient ? gastroparesis 4. If she wants to pursue getting her gallbladder out prophylactically then this can be done as an OP.  Although, there is low yield if this is done in resolving the patients symptoms as she does not even have biliary dyskinesia.   5. Will sign off, please call with questions/concerns     LOS: 3 days    [name], [name] 10/25/2013, 8:09 AM Pager: [phone]

## 2013-10-25 NOTE — Progress Notes (Signed)
[name] to be D/C'd Home per MD order.  Discussed with the patient and all questions fully answered.    Medication List    STOP taking these medications       omeprazole 20 MG capsule  Commonly known as:  PRILOSEC      TAKE these medications       albuterol 108 (90 BASE) MCG/ACT inhaler  Commonly known as:  PROAIR HFA  Inhale 2 puffs into the lungs every 6 (six) hours as needed for wheezing or shortness of breath.     allopurinol 100 MG tablet  Commonly known as:  ZYLOPRIM  Take 1 tablet (100 mg total) by mouth every morning.     amLODipine 5 MG tablet  Commonly known as:  NORVASC  Take 1 tablet (5 mg total) by mouth daily.     BREO ELLIPTA 100-25 MCG/INH Aepb  Generic drug:  Fluticasone Furoate-Vilanterol  Inhale 1 puff into the lungs 2 (two) times daily.     carvedilol 25 MG tablet  Commonly known as:  COREG  Take 1 tablet (25 mg total) by mouth 2 (two) times daily with a meal.     ciprofloxacin 500 MG tablet  Commonly known as:  CIPRO  Take 1 tablet (500 mg total) by mouth 2 (two) times daily.     citalopram 40 MG tablet  Commonly known as:  CELEXA  Take 40 mg by mouth daily.     clobetasol 0.05 % external solution  Commonly known as:  TEMOVATE  Apply 1 application topically daily as needed. Applied to scalp for psoriasis     ferrous sulfate 325 (65 FE) MG tablet  Take 1 tablet (325 mg total) by mouth daily with breakfast.     furosemide 40 MG tablet  Commonly known as:  LASIX  Take 40-80 mg by mouth 2 (two) times daily. 2 tablets in the morning and 1 tablet in the evening     gabapentin 800 MG tablet  Commonly known as:  NEURONTIN  Take 1,600 mg by mouth at bedtime.     HYDROcodone-acetaminophen 10-325 MG per tablet  Commonly known as:  NORCO  Take 1 tablet by mouth every 6 (six) hours as needed.     LANTUS SOLOSTAR 100 UNIT/ML Solostar Pen  Generic drug:  Insulin Glargine  Inject 30-60 Units into the skin 2 (two) times daily. 60 units in the  morning and 30 units at night     levothyroxine 50 MCG tablet  Commonly known as:  SYNTHROID, LEVOTHROID  Take 1 tablet (50 mcg total) by mouth every evening.     linagliptin 5 MG Tabs tablet  Commonly known as:  TRADJENTA  Take 5 mg by mouth daily.     losartan 50 MG tablet  Commonly known as:  COZAAR  Take 50 mg by mouth daily.     metoCLOPramide 5 MG tablet  Commonly known as:  REGLAN  Take 1 tablet (5 mg total) by mouth every 8 (eight) hours as needed for nausea.     metroNIDAZOLE 500 MG tablet  Commonly known as:  FLAGYL  Take 1 tablet (500 mg total) by mouth 3 (three) times daily.     nitroGLYCERIN 0.4 MG SL tablet  Commonly known as:  NITROSTAT  Place 1 tablet (0.4 mg total) under the tongue every 5 (five) minutes as needed for chest pain.     NOVOLOG FLEXPEN 100 UNIT/ML FlexPen  Generic drug:  insulin aspart  Inject 45 Units  into the skin 3 (three) times daily with meals.     oxyCODONE 5 MG immediate release tablet  Commonly known as:  ROXICODONE  Take 1 tablet (5 mg total) by mouth every 6 (six) hours as needed for severe pain.     pantoprazole 40 MG tablet  Commonly known as:  PROTONIX  Take 1 tablet (40 mg total) by mouth daily.     potassium chloride 10 MEQ tablet  Commonly known as:  K-DUR,KLOR-CON  Take 1 tablet (10 mEq total) by mouth 2 (two) times daily.     pravastatin 40 MG tablet  Commonly known as:  PRAVACHOL  Take 1 tablet (40 mg total) by mouth at bedtime.     rOPINIRole 1 MG tablet  Commonly known as:  REQUIP  Take 1 tablet (1 mg total) by mouth at bedtime.     sodium bicarbonate 650 MG tablet  Take 650 mg by mouth 3 (three) times daily.     tiotropium 18 MCG inhalation capsule  Commonly known as:  SPIRIVA HANDIHALER  Place 1 capsule (18 mcg total) into inhaler and inhale every morning.        VVS, Skin clean, dry and intact without evidence of skin break down, no evidence of skin tears noted. IV catheter discontinued intact. Site  without signs and symptoms of complications. Dressing and pressure applied.  An After Visit Summary was printed and given to the patient.  D/c education completed with patient/family including follow up instructions, medication list, d/c activities limitations if indicated, with other d/c instructions as indicated by MD - patient able to verbalize understanding, all questions fully answered.   Patient instructed to return to ED, call 911, or call MD for any changes in condition.   Patient escorted via [place], and D/C home via private auto.  Delman Cheadle 10/25/2013 11:21 AM

## 2013-10-25 NOTE — Discharge Summary (Signed)
PATIENT DETAILS Name: [name] Age: 63 y.o. Sex: female Date of Birth: January 23, 1951 MRN: [id number]. Admit Date: 10/22/2013 Admitting Physician: Oswald Hillock, MD [id number], Elenore Rota, MD  Recommendations for Outpatient Follow-up:  1. Discharged with 7 days of cipro/flagyl. 2. Please have patient follow up with GI-etiology of RUQ unknown at this point  PRIMARY DISCHARGE DIAGNOSIS:  Principal Problem:   RUQ pain-unknown etiology Active Problems:   Hypothyroidism   Hypertension   RLS (restless legs syndrome)   Chronic kidney disease, stage IV (severe)   DM type 2, uncontrolled, with neuropathy   Hyperglycemia      PAST MEDICAL HISTORY: Past Medical History  Diagnosis Date  . Chest pain 2003    ?h/o MI, syncope; 2003 stress nuclear-inferoseptal scar, EF-54%; 2007-MI ruled out, echocardiogram-normal LV size, wall thickness and function  . RLS (restless legs syndrome)     sleep study in 2010: No apnea; moderate periodic movements  . GERD (gastroesophageal reflux disease)     H/o upper GI bleed  . GI bleeding   . Obese   . Gout   . Hypothyroidism   . Low back pain   . Cellulitis     Neck  . Diabetes mellitus 1998    A1c-7.1 in 2010, 6.7 in 2013  . Hyperlipidemia     Fasting lipids 2013:158, 152, 35, 93  . Hypertension 2002  . Anemia   . Hyperkalemia   . Irregular heart beat   . PONV (postoperative nausea and vomiting)   . Myocardial infarction   . CAD (coronary artery disease)     dr. Lattie Haw ([facility])  . Depression     Insomnia  . Headache(784.0)   . Vitiligo   . H/O hiatal hernia   . Arthritis   . Cancer     HX SKIN CANCER  . Anginal pain     last cp few months ago.  Marland Kitchen Dysrhythmia     "skips a beat"  . COPD (chronic obstructive pulmonary disease)   . Chronic kidney disease, stage III (moderate)     Creat-1.9 in 04/2008, 2.65 in 02/2010(noHD  . Renal insufficiency   . Neuropathy     DISCHARGE MEDICATIONS:   Medication List    STOP taking these  medications       omeprazole 20 MG capsule  Commonly known as:  PRILOSEC      TAKE these medications       albuterol 108 (90 BASE) MCG/ACT inhaler  Commonly known as:  PROAIR HFA  Inhale 2 puffs into the lungs every 6 (six) hours as needed for wheezing or shortness of breath.     allopurinol 100 MG tablet  Commonly known as:  ZYLOPRIM  Take 1 tablet (100 mg total) by mouth every morning.     amLODipine 5 MG tablet  Commonly known as:  NORVASC  Take 1 tablet (5 mg total) by mouth daily.     BREO ELLIPTA 100-25 MCG/INH Aepb  Generic drug:  Fluticasone Furoate-Vilanterol  Inhale 1 puff into the lungs 2 (two) times daily.     carvedilol 25 MG tablet  Commonly known as:  COREG  Take 1 tablet (25 mg total) by mouth 2 (two) times daily with a meal.     ciprofloxacin 500 MG tablet  Commonly known as:  CIPRO  Take 1 tablet (500 mg total) by mouth 2 (two) times daily.     citalopram 40 MG tablet  Commonly known as:  CELEXA  Take 40 mg  by mouth daily.     clobetasol 0.05 % external solution  Commonly known as:  TEMOVATE  Apply 1 application topically daily as needed. Applied to scalp for psoriasis     ferrous sulfate 325 (65 FE) MG tablet  Take 1 tablet (325 mg total) by mouth daily with breakfast.     furosemide 40 MG tablet  Commonly known as:  LASIX  Take 40-80 mg by mouth 2 (two) times daily. 2 tablets in the morning and 1 tablet in the evening     gabapentin 800 MG tablet  Commonly known as:  NEURONTIN  Take 1,600 mg by mouth at bedtime.     HYDROcodone-acetaminophen 10-325 MG per tablet  Commonly known as:  NORCO  Take 1 tablet by mouth every 6 (six) hours as needed.     LANTUS SOLOSTAR 100 UNIT/ML Solostar Pen  Generic drug:  Insulin Glargine  Inject 30-60 Units into the skin 2 (two) times daily. 60 units in the morning and 30 units at night     levothyroxine 50 MCG tablet  Commonly known as:  SYNTHROID, LEVOTHROID  Take 1 tablet (50 mcg total) by mouth every  evening.     linagliptin 5 MG Tabs tablet  Commonly known as:  TRADJENTA  Take 5 mg by mouth daily.     losartan 50 MG tablet  Commonly known as:  COZAAR  Take 50 mg by mouth daily.     metoCLOPramide 5 MG tablet  Commonly known as:  REGLAN  Take 1 tablet (5 mg total) by mouth every 8 (eight) hours as needed for nausea.     metroNIDAZOLE 500 MG tablet  Commonly known as:  FLAGYL  Take 1 tablet (500 mg total) by mouth 3 (three) times daily.     nitroGLYCERIN 0.4 MG SL tablet  Commonly known as:  NITROSTAT  Place 1 tablet (0.4 mg total) under the tongue every 5 (five) minutes as needed for chest pain.     NOVOLOG FLEXPEN 100 UNIT/ML FlexPen  Generic drug:  insulin aspart  Inject 45 Units into the skin 3 (three) times daily with meals.     oxyCODONE 5 MG immediate release tablet  Commonly known as:  ROXICODONE  Take 1 tablet (5 mg total) by mouth every 6 (six) hours as needed for severe pain.     pantoprazole 40 MG tablet  Commonly known as:  PROTONIX  Take 1 tablet (40 mg total) by mouth daily.     potassium chloride 10 MEQ tablet  Commonly known as:  K-DUR,KLOR-CON  Take 1 tablet (10 mEq total) by mouth 2 (two) times daily.     pravastatin 40 MG tablet  Commonly known as:  PRAVACHOL  Take 1 tablet (40 mg total) by mouth at bedtime.     rOPINIRole 1 MG tablet  Commonly known as:  REQUIP  Take 1 tablet (1 mg total) by mouth at bedtime.     sodium bicarbonate 650 MG tablet  Take 650 mg by mouth 3 (three) times daily.     tiotropium 18 MCG inhalation capsule  Commonly known as:  SPIRIVA HANDIHALER  Place 1 capsule (18 mcg total) into inhaler and inhale every morning.        ALLERGIES:   Allergies  Allergen Reactions  . Latex Shortness Of Breath and Swelling    Facial swelling  . Vancomycin Shortness Of Breath  . Vancomycin Hcl Swelling    REACTION: facial swelling  . Quinapril Hcl Swelling and Rash  Arm swelling    BRIEF HPI:  See H&P, Labs, Consult  and Test reports for all details. In brief, patient 63 year old female who has a past medical history GERD, GI bleeding, Gout, Diabetes mellitus, Hypertension, CKD stage 3, and Renal insufficiency who came to the ED with chief complaint of abdominal pain in the RUQ and right flank pain that had been going on for a week.  The pain worsened with movement and coughing and was rated at 8/10 in intensity.  She denied dysuria, urgency, or frequency of urination, N/V/D, CP, and SOB.  Patient also has been complaining of increasing leg swelling over the past few days.  Patient takes high-dose Lasix at home. She has been followed by nephrology as outpatient patient.  CONSULTATIONS:   general surgery  PERTINENT RADIOLOGIC STUDIES: Ct Abdomen Pelvis Wo Contrast  10/25/2013   CLINICAL DATA:  Right upper quadrant pain  EXAM: CT ABDOMEN AND PELVIS WITHOUT CONTRAST  TECHNIQUE: Multidetector CT imaging of the abdomen and pelvis was performed following the standard protocol without IV contrast.  COMPARISON:  10/22/2013  FINDINGS: BODY WALL: Scattered medication injection sites.  LOWER CHEST: Trace pleural effusions, right more than left.  ABDOMEN/PELVIS:  Liver: No focal abnormality. There is unchanged mild fatty infiltration around the lower round ligament. Omental infarct in this location has been described in the literature.  Biliary: Unchanged infiltration of the pericholecystic fat. Cystic duct is patent per cholescintigraphy.  Pancreas: Unremarkable.  Spleen: Unremarkable.  Adrenals: Unremarkable.  Kidneys and ureters: Tiny left lower pole calculus. No hydronephrosis.  Bladder: Unremarkable.  Reproductive: Hysterectomy.  Negative adnexae.  Bowel: Distal colonic diverticulosis. Mid duodenal diverticulum. No active inflammation. No bowel obstruction. Negative appendix.  Retroperitoneum: No mass or adenopathy.  Peritoneum: Trace free fluid in the pelvis.  Vascular: No acute abnormality.  OSSEOUS: No acute abnormalities.   IMPRESSION: 1. Mild fat infiltration around the round ligament of the liver. Fat infarct can occur in this location. 2. Unchanged pericholecystic edema. 3. Trace bilateral pleural effusion.   Electronically Signed   By: Jorje Guild M.D.   On: 10/25/2013 03:02   Ct Abdomen Pelvis Wo Contrast  10/22/2013   CLINICAL DATA:  Chest pain and shortness of breath, abdominal pain  EXAM: CT ABDOMEN AND PELVIS WITHOUT CONTRAST  TECHNIQUE: Multidetector CT imaging of the abdomen and pelvis was performed following the standard protocol without IV contrast.  COMPARISON:  None.  FINDINGS: Lung bases show mild atelectatic changes bilaterally.  The liver, spleen, adrenal glands and pancreas are within normal limits. The gallbladder is well distended and demonstrates some indistinctness of the wall which may be related wall thickening and wall edema. Kidneys are well visualized bilaterally but demonstrate some mild perinephric stranding. No obstructive changes are seen. No renal calculi are noted. Diverticular change is noted without diverticulitis. The appendix is within normal limits. The bladder is well distended. The uterus has been surgically removed. Degenerative changes of the lumbar spine are noted.  IMPRESSION: Indistinctness of the gallbladder wall which may be related to gallbladder wall edema and inflammation. In the appropriate clinical setting this could represent acute cholecystitis. Ultrasound of the right upper quadrant may be helpful for further evaluation as necessary.   Electronically Signed   By: Inez Catalina M.D.   On: 10/22/2013 21:10   Dg Chest 2 View  10/22/2013   CLINICAL DATA:  Chronic shortness of breath, lower extremity swelling  EXAM: CHEST  2 VIEW  COMPARISON:  03/14/2013  FINDINGS: Pulmonary vascular congestion  without frank interstitial edema. Mild bilateral lower lobe opacities, likely atelectasis. No pleural effusion or pneumothorax.  Cardiomegaly.  Mild degenerative changes of the visualized  thoracolumbar spine.  IMPRESSION: Pulmonary vascular congestion without frank interstitial edema.  Mild bilateral lower lobe opacities, likely atelectasis.   Electronically Signed   By: Julian Hy M.D.   On: 10/22/2013 18:16   Nm Hepato W/eject Fract  10/24/2013   CLINICAL DATA:  Abdominal pain, evaluate for cholecystitis  EXAM: NUCLEAR MEDICINE HEPATOBILIARY IMAGING WITH GALLBLADDER EF  TECHNIQUE: Sequential images of the abdomen were obtained out to 60 minutes following intravenous administration of radiopharmaceutical. After slow intravenous infusion of 2.2 micrograms Cholecystokinin, gallbladder ejection fraction was determined.  RADIOPHARMACEUTICALS:  5 [employer] 123XX123 Choletec  COMPARISON:  None  FINDINGS: There is immediate homogeneous uptake of radiotracer in the liver. Filling of the gallbladder begins at 20 minutes. Radiotracer uptake is present in the small bowel at 15 minutes.  At the end of 1-hour, there is relatively good clearance of activity from the liver.  When gallbladder filling was complete, the patient was given an IV infusion of CCK. At 30 min the gallbladder ejection fraction measures 83.5%. At 30 min, normal ejection fraction is greater than 30%.  The patient did not experience symptoms during CCK infusion.  IMPRESSION: 1. Normal hepatobiliary scan. 2. Normal gallbladder ejection fraction.   Electronically Signed   By: Kathreen Devoid   On: 10/24/2013 12:38   Mm Digital Diagnostic Bilat  10/18/2013   CLINICAL DATA:  Diffuse left breast pain.  EXAM: DIGITAL DIAGNOSTIC  bilateral MAMMOGRAM WITH CAD  COMPARISON:  12/31/2009, 12/27/2008.  ACR Breast Density Category b: There are scattered areas of fibroglandular density.  FINDINGS: There is no mass, distortion, or worrisome calcification within either breast. The parenchymal pattern is stable.  Mammographic images were processed with CAD.  IMPRESSION: No findings worrisome for malignancy. The patient's diffuse left breast pain is most  likely on a hormonal basis.  RECOMMENDATION: Recommend bilateral screening mammography 1 year.  I have discussed the findings and recommendations with the patient. Results were also provided in writing at the conclusion of the visit. If applicable, a reminder letter will be sent to the patient regarding the next appointment.  BI-RADS CATEGORY  1: Negative.   Electronically Signed   By: Luberta Robertson M.D.   On: 10/18/2013 15:04   US Abdomen Limited Ruq  10/23/2013   CLINICAL DATA:  Right upper quadrant pain  EXAM: US ABDOMEN LIMITED - RIGHT UPPER QUADRANT  COMPARISON:  None.  FINDINGS: Gallbladder:  Mild gallbladder wall thickening/ edema with trace pericholecystic fluid. No gallstones. Negative sonographic Murphy's sign.  Common bile duct:  Diameter: 4 mm.  Liver:  Hyperechoic hepatic parenchyma, suggesting hepatic steatosis.  IMPRESSION: Mild gallbladder wall thickening/edema, nonspecific, but without cholelithiasis or associated sonographic findings to suggest acute cholecystitis.  Suspected hepatic steatosis.   Electronically Signed   By: Julian Hy M.D.   On: 10/23/2013 12:06     PERTINENT LAB RESULTS: CBC:  Recent Labs  10/24/13 0642 10/25/13 0600  WBC 12.8* 11.4*  HGB 12.4 11.8*  HCT 38.3 36.5  PLT 334 335   CMET CMP     Component Value Date/Time   NA 140 10/25/2013 0600   NA 132* 09/30/2013 1515   K 3.5* 10/25/2013 0600   CL 101 10/25/2013 0600   CO2 24 10/25/2013 0600   GLUCOSE 159* 10/25/2013 0600   GLUCOSE 451* 09/30/2013 1515   BUN 25* 10/25/2013 0600  BUN 25 09/30/2013 1515   CREATININE 1.66* 10/25/2013 0600   CREATININE 1.84* 09/30/2012 1238   CREATININE 1.81* 05/15/2008 1046   CALCIUM 8.4 10/25/2013 0600   PROT 6.7 10/25/2013 0600   PROT 7.2 09/30/2013 1515   ALBUMIN 2.9* 10/25/2013 0600   AST 19 10/25/2013 0600   ALT 29 10/25/2013 0600   ALKPHOS 101 10/25/2013 0600   BILITOT 0.5 10/25/2013 0600   GFRNONAA 32* 10/25/2013 0600   GFRNONAA 29* 09/30/2012 1238   GFRAA 37* 10/25/2013 0600    GFRAA 33* 09/30/2012 1238    GFR Estimated Creatinine Clearance: 43.7 ml/min (by C-G formula based on Cr of 1.66).  Recent Labs  10/22/13 2007  LIPASE 13   Microbiology: Recent Results (from the past 240 hour(s))  SURGICAL PCR SCREEN     Status: None   Collection Time    10/23/13  2:28 AM      Result Value Ref Range Status   MRSA, PCR NEGATIVE  NEGATIVE Final   Staphylococcus aureus NEGATIVE  NEGATIVE Final   Comment:            The Xpert SA Assay (FDA     approved for NASAL specimens     in patients over 46 years of age),     is one component of     a comprehensive surveillance     program.  Test performance has     been validated by [employer] for patients greater     than or equal to 40 year old.     It is not intended     to diagnose infection nor to     guide or monitor treatment.     BRIEF HOSPITAL COURSE:  RUQ Pain-unknown etiology  -patient initially evaluated at [facility]-transferred to [facility] evaluation as CT showed "indistinctness of the gallbladder wall" which could be due to inflammation.Was started empirically on Zosyn -Seen by [facility]-surgery was not convinced that this was acute cholecystitis-abdominal ultrasound ordered-no clear cut signs of cholecystitis. This was followed by HIDA scan which came back negative for cholecystitis and EF was normal.-A repeat CT scan on 8/3  was also unremarkable for any acute abnormalities. -Meanwhile, patient was provided with supportive care, diet slowly advanced. She is currently pain free, etiology of RUQ at this time is not known, ?Gastritis ?Gastroparesis-but since clinically improved, able to tolerated diet, suspect further work up can be done in the outpatient setting. I have asked her to make a follow up appt with her Primary GI.  Active Problems:  DM-2 with Hypoglycemia  -while NPO-decreased Lantus to 30 units on 8/3-however continued to be hypoglycemic-hence stopped Lantus -continued with D5 0.45 NS, and place  on sensitive SSI while inpatient. -Once oral intake resumed-she was started back on Lantus, patient to resume her usual insulin regimen on discharge.   Hypothyroidism  -c/w Levothyroxine   Hypertension  -controlled, c/w Coreg,Amlodipine   RLS (restless legs syndrome)  -stable-c/w Requip   Chronic kidney disease, stage 3  -at baseline-continued to monitory lytes periodically   Dyslipidemia  -c/w Statins   COPD  -stable this admit-lungs clear-c/w Dulera-prn Bebs   TODAY-DAY OF DISCHARGE:  Subjective:   [name] today has no headache,no chest or abdominal pain, no new weakness, tingling, or numbness, feels much better wants to go home today.  States her pain has resolved significantly and feels much better today.  Her appetite is fine and is eating without complaints of N/V/D or  pain.  BM and urination are normal.  Ambulating well.    Objective:   Blood pressure 160/70, pulse 70, temperature 98.7 F (37.1 C), temperature source Oral, resp. rate 18, height 5\' 7"  (1.702 m), weight 109.8 kg (242 lb 1 oz), SpO2 96.00%.  Intake/Output Summary (Last 24 hours) at 10/25/13 1058 Last data filed at 10/24/13 1300  Gross per 24 hour  Intake      0 ml  Output      0 ml  Net      0 ml   Filed Weights   10/22/13 1754 10/23/13 0207  Weight: 108.863 kg (240 lb) 109.8 kg (242 lb 1 oz)    Exam Gen Exam: Awake and alert with clear speech.  Neck: Supple, No JVD.  Chest: B/L Clear. No rales, crackles or wheezes noted  CVS: S1 S2 Regular, no murmurs, gallops, or rubs  Abdomen: soft, BS +, no tenderness, non distended.  Extremities: no edema noted bilateral legs, lower extremities warm to touch.  Neurologic: Non Focal.  Skin: No Rash.    DISCHARGE CONDITION: Stable  DISPOSITION: Home  DISCHARGE INSTRUCTIONS:    Activity:  As tolerated without restrictions  Diet recommendation: Diabetic Diet Heart Healthy diet Discharge Instructions   Activity as tolerated - No  restrictions    Complete by:  As directed      Call MD for:  persistant nausea and vomiting    Complete by:  As directed      Call MD for:  severe uncontrolled pain    Complete by:  As directed      Diet general    Complete by:  As directed           Follow-up Information   Follow up with Redge Gainer, MD In 1 week.   Specialty:  Family Medicine   Contact information:   [place] [address] [phone]       Follow up with Manus Rudd, MD. Schedule an appointment as soon as possible for a visit in 2 weeks.   Specialty:  Gastroenterology   Contact information:   [place] [address] [phone]       Total Time spent on discharge equals 45 minutes.  Signed: [employer] 10/25/2013 10:58 AM  **Disclaimer: This note may have been dictated with voice recognition software. Similar sounding words can inadvertently be transcribed and this note may contain transcription errors which may not have been corrected upon publication of note.**  Attending Patient was seen, examined,treatment plan was discussed with the Physician extender. I have directly reviewed the clinical findings, lab, imaging studies and management of this patient in detail. I have made the necessary changes to the above noted documentation, and agree with the documentation, as recorded by the Physician extender.  Nena Alexander MD [facility].

## 2013-10-26 ENCOUNTER — Observation Stay (HOSPITAL_COMMUNITY)
Admission: EM | Admit: 2013-10-26 | Discharge: 2013-10-28 | Disposition: A | Discharge status: Home / Self Care | Payer: Medicare HMO | Attending: Internal Medicine | Admitting: Internal Medicine

## 2013-10-26 ENCOUNTER — Emergency Department (HOSPITAL_COMMUNITY): Payer: Medicare HMO

## 2013-10-26 ENCOUNTER — Encounter (HOSPITAL_COMMUNITY): Payer: Self-pay | Admitting: Emergency Medicine

## 2013-10-26 DIAGNOSIS — Z90710 Acquired absence of both cervix and uterus: Secondary | ICD-10-CM | POA: Diagnosis not present

## 2013-10-26 DIAGNOSIS — G2581 Restless legs syndrome: Secondary | ICD-10-CM | POA: Diagnosis not present

## 2013-10-26 DIAGNOSIS — L408 Other psoriasis: Secondary | ICD-10-CM | POA: Diagnosis not present

## 2013-10-26 DIAGNOSIS — G8929 Other chronic pain: Secondary | ICD-10-CM | POA: Insufficient documentation

## 2013-10-26 DIAGNOSIS — N184 Chronic kidney disease, stage 4 (severe): Secondary | ICD-10-CM | POA: Diagnosis present

## 2013-10-26 DIAGNOSIS — K219 Gastro-esophageal reflux disease without esophagitis: Secondary | ICD-10-CM | POA: Diagnosis not present

## 2013-10-26 DIAGNOSIS — E039 Hypothyroidism, unspecified: Secondary | ICD-10-CM | POA: Insufficient documentation

## 2013-10-26 DIAGNOSIS — Z79899 Other long term (current) drug therapy: Secondary | ICD-10-CM | POA: Insufficient documentation

## 2013-10-26 DIAGNOSIS — F3289 Other specified depressive episodes: Secondary | ICD-10-CM | POA: Insufficient documentation

## 2013-10-26 DIAGNOSIS — E1149 Type 2 diabetes mellitus with other diabetic neurological complication: Secondary | ICD-10-CM

## 2013-10-26 DIAGNOSIS — I252 Old myocardial infarction: Secondary | ICD-10-CM | POA: Insufficient documentation

## 2013-10-26 DIAGNOSIS — I129 Hypertensive chronic kidney disease with stage 1 through stage 4 chronic kidney disease, or unspecified chronic kidney disease: Secondary | ICD-10-CM | POA: Diagnosis not present

## 2013-10-26 DIAGNOSIS — E162 Hypoglycemia, unspecified: Secondary | ICD-10-CM

## 2013-10-26 DIAGNOSIS — I10 Essential (primary) hypertension: Secondary | ICD-10-CM

## 2013-10-26 DIAGNOSIS — R1013 Epigastric pain: Secondary | ICD-10-CM

## 2013-10-26 DIAGNOSIS — F329 Major depressive disorder, single episode, unspecified: Secondary | ICD-10-CM | POA: Insufficient documentation

## 2013-10-26 DIAGNOSIS — IMO0002 Reserved for concepts with insufficient information to code with codable children: Principal | ICD-10-CM | POA: Insufficient documentation

## 2013-10-26 DIAGNOSIS — E785 Hyperlipidemia, unspecified: Secondary | ICD-10-CM | POA: Diagnosis present

## 2013-10-26 DIAGNOSIS — R609 Edema, unspecified: Secondary | ICD-10-CM | POA: Diagnosis not present

## 2013-10-26 DIAGNOSIS — Z85828 Personal history of other malignant neoplasm of skin: Secondary | ICD-10-CM | POA: Insufficient documentation

## 2013-10-26 DIAGNOSIS — E669 Obesity, unspecified: Secondary | ICD-10-CM | POA: Diagnosis not present

## 2013-10-26 DIAGNOSIS — Z794 Long term (current) use of insulin: Secondary | ICD-10-CM | POA: Insufficient documentation

## 2013-10-26 DIAGNOSIS — M109 Gout, unspecified: Secondary | ICD-10-CM | POA: Diagnosis not present

## 2013-10-26 DIAGNOSIS — L409 Psoriasis, unspecified: Secondary | ICD-10-CM | POA: Diagnosis present

## 2013-10-26 DIAGNOSIS — T383X5A Adverse effect of insulin and oral hypoglycemic [antidiabetic] drugs, initial encounter: Secondary | ICD-10-CM | POA: Insufficient documentation

## 2013-10-26 DIAGNOSIS — J449 Chronic obstructive pulmonary disease, unspecified: Secondary | ICD-10-CM | POA: Insufficient documentation

## 2013-10-26 DIAGNOSIS — E1165 Type 2 diabetes mellitus with hyperglycemia: Secondary | ICD-10-CM | POA: Diagnosis not present

## 2013-10-26 DIAGNOSIS — E1169 Type 2 diabetes mellitus with other specified complication: Principal | ICD-10-CM

## 2013-10-26 DIAGNOSIS — K2960 Other gastritis without bleeding: Secondary | ICD-10-CM | POA: Insufficient documentation

## 2013-10-26 DIAGNOSIS — E1142 Type 2 diabetes mellitus with diabetic polyneuropathy: Secondary | ICD-10-CM | POA: Diagnosis not present

## 2013-10-26 DIAGNOSIS — D649 Anemia, unspecified: Secondary | ICD-10-CM

## 2013-10-26 DIAGNOSIS — E1140 Type 2 diabetes mellitus with diabetic neuropathy, unspecified: Secondary | ICD-10-CM

## 2013-10-26 DIAGNOSIS — J4489 Other specified chronic obstructive pulmonary disease: Secondary | ICD-10-CM | POA: Insufficient documentation

## 2013-10-26 LAB — COMPREHENSIVE METABOLIC PANEL
ALBUMIN: 3.3 g/dL — AB (ref 3.5–5.2)
ALK PHOS: 112 U/L (ref 39–117)
ALT: 30 U/L (ref 0–35)
AST: 19 U/L (ref 0–37)
Anion gap: 12 (ref 5–15)
BILIRUBIN TOTAL: 0.4 mg/dL (ref 0.3–1.2)
BUN: 22 mg/dL (ref 6–23)
CHLORIDE: 104 meq/L (ref 96–112)
CO2: 26 mEq/L (ref 19–32)
Calcium: 9 mg/dL (ref 8.4–10.5)
Creatinine, Ser: 1.55 mg/dL — ABNORMAL HIGH (ref 0.50–1.10)
GFR calc Af Amer: 40 mL/min — ABNORMAL LOW (ref >90)
GFR calc non Af Amer: 34 mL/min — ABNORMAL LOW (ref >90)
Glucose, Bld: 52 mg/dL — ABNORMAL LOW (ref 70–99)
POTASSIUM: 3.3 meq/L — AB (ref 3.7–5.3)
Sodium: 142 mEq/L (ref 137–147)
Total Protein: 7.6 g/dL (ref 6.0–8.3)

## 2013-10-26 LAB — URINALYSIS, ROUTINE W REFLEX MICROSCOPIC
Bilirubin Urine: NEGATIVE
GLUCOSE, UA: NEGATIVE mg/dL
HGB URINE DIPSTICK: NEGATIVE
Ketones, ur: NEGATIVE mg/dL
Leukocytes, UA: NEGATIVE
Nitrite: NEGATIVE
Protein, ur: 30 mg/dL — AB
Specific Gravity, Urine: 1.025 (ref 1.005–1.030)
UROBILINOGEN UA: 0.2 mg/dL (ref 0.0–1.0)
pH: 5 (ref 5.0–8.0)

## 2013-10-26 LAB — CBG MONITORING, ED
GLUCOSE-CAPILLARY: 145 mg/dL — AB (ref 70–99)
GLUCOSE-CAPILLARY: 42 mg/dL — AB (ref 70–99)
GLUCOSE-CAPILLARY: 62 mg/dL — AB (ref 70–99)
Glucose-Capillary: 53 mg/dL — ABNORMAL LOW (ref 70–99)
Glucose-Capillary: 107 mg/dL — ABNORMAL HIGH (ref 70–99)

## 2013-10-26 LAB — CBC WITH DIFFERENTIAL/PLATELET
BASOS ABS: 0 10*3/uL (ref 0.0–0.1)
BASOS PCT: 0 % (ref 0–1)
Eosinophils Absolute: 0.4 10*3/uL (ref 0.0–0.7)
Eosinophils Relative: 3 % (ref 0–5)
HEMATOCRIT: 36.8 % (ref 36.0–46.0)
Hemoglobin: 12.1 g/dL (ref 12.0–15.0)
LYMPHS PCT: 20 % (ref 12–46)
Lymphs Abs: 2.4 10*3/uL (ref 0.7–4.0)
MCH: 28.5 pg (ref 26.0–34.0)
MCHC: 32.9 g/dL (ref 30.0–36.0)
MCV: 86.8 fL (ref 78.0–100.0)
MONO ABS: 1.1 10*3/uL — AB (ref 0.1–1.0)
Monocytes Relative: 9 % (ref 3–12)
Neutro Abs: 8.1 10*3/uL — ABNORMAL HIGH (ref 1.7–7.7)
Neutrophils Relative %: 68 % (ref 43–77)
Platelets: 387 10*3/uL (ref 150–400)
RBC: 4.24 MIL/uL (ref 3.87–5.11)
RDW: 15.1 % (ref 11.5–15.5)
WBC: 12 10*3/uL — ABNORMAL HIGH (ref 4.0–10.5)

## 2013-10-26 LAB — GLUCOSE, CAPILLARY
Glucose-Capillary: 127 mg/dL — ABNORMAL HIGH (ref 70–99)
Glucose-Capillary: 86 mg/dL (ref 70–99)

## 2013-10-26 LAB — URINE MICROSCOPIC-ADD ON

## 2013-10-26 LAB — LIPASE, BLOOD: Lipase: 24 U/L (ref 11–59)

## 2013-10-26 MED ORDER — CLOBETASOL PROPIONATE 0.05 % EX SOLN
1.0000 "application " | Freq: Two times a day (BID) | CUTANEOUS | Status: DC
  Filled 2013-10-26: qty 50

## 2013-10-26 MED ORDER — ONDANSETRON HCL 4 MG/2ML IJ SOLN
4.0000 mg | Freq: Once | INTRAMUSCULAR | Status: AC
  Administered 2013-10-26: 4 mg via INTRAVENOUS
  Filled 2013-10-26: qty 2

## 2013-10-26 MED ORDER — DEXTROSE-NACL 5-0.45 % IV SOLN
INTRAVENOUS | Status: AC
  Administered 2013-10-26: 16:00:00 via INTRAVENOUS

## 2013-10-26 MED ORDER — FLUTICASONE FUROATE-VILANTEROL 100-25 MCG/INH IN AEPB
1.0000 | INHALATION_SPRAY | Freq: Two times a day (BID) | RESPIRATORY_TRACT | Status: DC
  Administered 2013-10-26 – 2013-10-27 (×2): 1 via RESPIRATORY_TRACT

## 2013-10-26 MED ORDER — DEXTROSE 50 % IV SOLN
INTRAVENOUS | Status: AC
  Administered 2013-10-26: 50 mL
  Filled 2013-10-26: qty 50

## 2013-10-26 MED ORDER — ALBUTEROL SULFATE (2.5 MG/3ML) 0.083% IN NEBU: 3.0000 mL | INHALATION_SOLUTION | Freq: Four times a day (QID) | RESPIRATORY_TRACT | Status: DC | PRN

## 2013-10-26 MED ORDER — PANTOPRAZOLE SODIUM 40 MG IV SOLR
40.0000 mg | Freq: Two times a day (BID) | INTRAVENOUS | Status: DC
  Administered 2013-10-26 – 2013-10-28 (×4): 40 mg via INTRAVENOUS
  Filled 2013-10-26 (×4): qty 40

## 2013-10-26 MED ORDER — SODIUM BICARBONATE 650 MG PO TABS
650.0000 mg | ORAL_TABLET | Freq: Three times a day (TID) | ORAL | Status: DC
  Administered 2013-10-26 – 2013-10-28 (×5): 650 mg via ORAL
  Filled 2013-10-26 (×5): qty 1

## 2013-10-26 MED ORDER — LEVOTHYROXINE SODIUM 50 MCG PO TABS
50.0000 ug | ORAL_TABLET | Freq: Every evening | ORAL | Status: DC
  Administered 2013-10-27: 50 ug via ORAL
  Filled 2013-10-26: qty 1

## 2013-10-26 MED ORDER — SODIUM CHLORIDE 0.9 % IJ SOLN
3.0000 mL | Freq: Two times a day (BID) | INTRAMUSCULAR | Status: DC
  Administered 2013-10-26 – 2013-10-28 (×3): 3 mL via INTRAVENOUS

## 2013-10-26 MED ORDER — GABAPENTIN 400 MG PO CAPS
1600.0000 mg | ORAL_CAPSULE | Freq: Every day | ORAL | Status: DC
  Administered 2013-10-26 – 2013-10-27 (×2): 1600 mg via ORAL
  Filled 2013-10-26: qty 4

## 2013-10-26 MED ORDER — HYDROCODONE-ACETAMINOPHEN 5-325 MG PO TABS
1.0000 | ORAL_TABLET | ORAL | Status: DC | PRN
  Administered 2013-10-27 (×2): 2 via ORAL
  Filled 2013-10-26: qty 2

## 2013-10-26 MED ORDER — HYDROMORPHONE HCL PF 1 MG/ML IJ SOLN
1.0000 mg | Freq: Once | INTRAMUSCULAR | Status: AC
  Administered 2013-10-26: 1 mg via INTRAVENOUS
  Filled 2013-10-26: qty 1

## 2013-10-26 MED ORDER — POTASSIUM CHLORIDE CRYS ER 10 MEQ PO TBCR
10.0000 meq | EXTENDED_RELEASE_TABLET | Freq: Two times a day (BID) | ORAL | Status: DC
  Administered 2013-10-26 – 2013-10-28 (×4): 10 meq via ORAL
  Filled 2013-10-26 (×4): qty 1

## 2013-10-26 MED ORDER — AMLODIPINE BESYLATE 5 MG PO TABS
5.0000 mg | ORAL_TABLET | Freq: Every day | ORAL | Status: DC
  Administered 2013-10-27 – 2013-10-28 (×2): 5 mg via ORAL
  Filled 2013-10-26 (×2): qty 1

## 2013-10-26 MED ORDER — ONDANSETRON HCL 4 MG/2ML IJ SOLN: 4.0000 mg | Freq: Four times a day (QID) | INTRAMUSCULAR | Status: DC | PRN

## 2013-10-26 MED ORDER — CARVEDILOL 12.5 MG PO TABS
25.0000 mg | ORAL_TABLET | Freq: Two times a day (BID) | ORAL | Status: DC
  Administered 2013-10-26 – 2013-10-28 (×4): 25 mg via ORAL
  Filled 2013-10-26 (×4): qty 2

## 2013-10-26 MED ORDER — SIMVASTATIN 10 MG PO TABS
5.0000 mg | ORAL_TABLET | Freq: Every day | ORAL | Status: DC
  Administered 2013-10-26 – 2013-10-27 (×2): 5 mg via ORAL
  Filled 2013-10-26 (×2): qty 1

## 2013-10-26 MED ORDER — GABAPENTIN 800 MG PO TABS
1600.0000 mg | ORAL_TABLET | Freq: Every day | ORAL | Status: DC
  Filled 2013-10-26 (×2): qty 2

## 2013-10-26 MED ORDER — TIOTROPIUM BROMIDE MONOHYDRATE 18 MCG IN CAPS
18.0000 ug | ORAL_CAPSULE | Freq: Every morning | RESPIRATORY_TRACT | Status: DC
  Filled 2013-10-26: qty 5

## 2013-10-26 MED ORDER — METRONIDAZOLE 500 MG PO TABS
500.0000 mg | ORAL_TABLET | Freq: Three times a day (TID) | ORAL | Status: DC
  Administered 2013-10-26 – 2013-10-28 (×5): 500 mg via ORAL
  Filled 2013-10-26 (×6): qty 1

## 2013-10-26 MED ORDER — DEXTROSE-NACL 5-0.45 % IV SOLN
INTRAVENOUS | Status: DC
  Administered 2013-10-26 (×2): via INTRAVENOUS

## 2013-10-26 MED ORDER — ROPINIROLE HCL 1 MG PO TABS
1.0000 mg | ORAL_TABLET | Freq: Every day | ORAL | Status: DC
  Administered 2013-10-26 – 2013-10-27 (×2): 1 mg via ORAL
  Filled 2013-10-26 (×5): qty 1

## 2013-10-26 MED ORDER — LINAGLIPTIN 5 MG PO TABS
5.0000 mg | ORAL_TABLET | Freq: Every day | ORAL | Status: DC
  Administered 2013-10-27 – 2013-10-28 (×2): 5 mg via ORAL
  Filled 2013-10-26 (×2): qty 1

## 2013-10-26 MED ORDER — ONDANSETRON HCL 4 MG PO TABS: 4.0000 mg | ORAL_TABLET | Freq: Four times a day (QID) | ORAL | Status: DC | PRN

## 2013-10-26 MED ORDER — MORPHINE SULFATE 2 MG/ML IJ SOLN: 1.0000 mg | INTRAMUSCULAR | Status: DC | PRN

## 2013-10-26 MED ORDER — INSULIN ASPART 100 UNIT/ML ~~LOC~~ SOLN
0.0000 [IU] | SUBCUTANEOUS | Status: DC
  Administered 2013-10-26 – 2013-10-27 (×2): 1 [IU] via SUBCUTANEOUS

## 2013-10-26 MED ORDER — METOCLOPRAMIDE HCL 10 MG PO TABS: 5.0000 mg | ORAL_TABLET | Freq: Three times a day (TID) | ORAL | Status: DC | PRN

## 2013-10-26 MED ORDER — ROPINIROLE HCL 1 MG PO TABS
ORAL_TABLET | ORAL | Status: AC
  Filled 2013-10-26: qty 1

## 2013-10-26 MED ORDER — FUROSEMIDE 40 MG PO TABS
40.0000 mg | ORAL_TABLET | Freq: Every evening | ORAL | Status: DC
  Administered 2013-10-26: 40 mg via ORAL
  Filled 2013-10-26 (×2): qty 1

## 2013-10-26 MED ORDER — CIPROFLOXACIN HCL 250 MG PO TABS
500.0000 mg | ORAL_TABLET | Freq: Two times a day (BID) | ORAL | Status: DC
  Administered 2013-10-26 – 2013-10-28 (×4): 500 mg via ORAL
  Filled 2013-10-26 (×4): qty 2

## 2013-10-26 MED ORDER — DEXTROSE-NACL 5-0.45 % IV SOLN
INTRAVENOUS | Status: DC
  Administered 2013-10-26: 15:00:00 via INTRAVENOUS

## 2013-10-26 MED ORDER — CITALOPRAM HYDROBROMIDE 20 MG PO TABS
40.0000 mg | ORAL_TABLET | Freq: Every day | ORAL | Status: DC
  Administered 2013-10-26 – 2013-10-28 (×3): 40 mg via ORAL
  Filled 2013-10-26 (×3): qty 2

## 2013-10-26 MED ORDER — GLUCAGON HCL RDNA (DIAGNOSTIC) 1 MG IJ SOLR
1.0000 mg | Freq: Once | INTRAMUSCULAR | Status: AC
  Administered 2013-10-26: 1 mg via INTRAVENOUS
  Filled 2013-10-26: qty 1

## 2013-10-26 MED ORDER — POLYETHYLENE GLYCOL 3350 17 G PO PACK: 17.0000 g | PACK | Freq: Every day | ORAL | Status: DC | PRN

## 2013-10-26 MED ORDER — POTASSIUM CHLORIDE 20 MEQ/15ML (10%) PO LIQD
40.0000 meq | Freq: Once | ORAL | Status: AC
  Administered 2013-10-26: 40 meq via ORAL
  Filled 2013-10-26: qty 30

## 2013-10-26 MED ORDER — GUAIFENESIN-DM 100-10 MG/5ML PO SYRP: 5.0000 mL | ORAL_SOLUTION | ORAL | Status: DC | PRN

## 2013-10-26 MED ORDER — FERROUS SULFATE 325 (65 FE) MG PO TABS
325.0000 mg | ORAL_TABLET | Freq: Every day | ORAL | Status: DC
  Administered 2013-10-27 – 2013-10-28 (×2): 325 mg via ORAL
  Filled 2013-10-26 (×2): qty 1

## 2013-10-26 MED ORDER — HEPARIN SODIUM (PORCINE) 5000 UNIT/ML IJ SOLN
5000.0000 [IU] | Freq: Three times a day (TID) | INTRAMUSCULAR | Status: DC
  Administered 2013-10-26 – 2013-10-27 (×2): 5000 [IU] via SUBCUTANEOUS
  Filled 2013-10-26 (×2): qty 1

## 2013-10-26 MED ORDER — FUROSEMIDE 40 MG PO TABS
80.0000 mg | ORAL_TABLET | Freq: Every morning | ORAL | Status: DC
  Administered 2013-10-27 – 2013-10-28 (×2): 80 mg via ORAL
  Filled 2013-10-26 (×2): qty 2

## 2013-10-26 MED ORDER — OXYCODONE HCL 5 MG PO TABS
5.0000 mg | ORAL_TABLET | Freq: Four times a day (QID) | ORAL | Status: DC | PRN
  Administered 2013-10-26 – 2013-10-27 (×2): 5 mg via ORAL
  Filled 2013-10-26 (×2): qty 1

## 2013-10-26 MED ORDER — ALLOPURINOL 100 MG PO TABS
100.0000 mg | ORAL_TABLET | Freq: Every morning | ORAL | Status: DC
  Administered 2013-10-27 – 2013-10-28 (×2): 100 mg via ORAL
  Filled 2013-10-26 (×2): qty 1

## 2013-10-26 MED ORDER — NITROGLYCERIN 0.4 MG SL SUBL: 0.4000 mg | SUBLINGUAL_TABLET | SUBLINGUAL | Status: DC | PRN

## 2013-10-26 NOTE — ED Notes (Signed)
Pt is now A&)x4 after administration of D50 amp.

## 2013-10-26 NOTE — H&P (Addendum)
Patient Demographics  [name], is a 63 y.o. female  MRN: [id number]   DOB - 1951-01-09  Admit Date - 10/26/2013  Outpatient Primary MD for the patient is MILLER, Lillette Boxer, MD   With History of -  Past Medical History  Diagnosis Date  . Chest pain 2003    ?h/o MI, syncope; 2003 stress nuclear-inferoseptal scar, EF-54%; 2007-MI ruled out, echocardiogram-normal LV size, wall thickness and function  . RLS (restless legs syndrome)     sleep study in 2010: No apnea; moderate periodic movements  . GERD (gastroesophageal reflux disease)     H/o upper GI bleed  . GI bleeding   . Obese   . Gout   . Hypothyroidism   . Low back pain   . Cellulitis     Neck  . Diabetes mellitus 1998    A1c-7.1 in 2010, 6.7 in 2013  . Hyperlipidemia     Fasting lipids 2013:158, 152, 35, 93  . Hypertension 2002  . Anemia   . Hyperkalemia   . Irregular heart beat   . PONV (postoperative nausea and vomiting)   . Myocardial infarction   . CAD (coronary artery disease)     dr. Lattie Haw (Linna Hoff)  . Depression     Insomnia  . Headache(784.0)   . Vitiligo   . H/O hiatal hernia   . Arthritis   . Cancer     HX SKIN CANCER  . Anginal pain     last cp few months ago.  Marland Kitchen Dysrhythmia     "skips a beat"  . COPD (chronic obstructive pulmonary disease)   . Chronic kidney disease, stage III (moderate)     Creat-1.9 in 04/2008, 2.65 in 02/2010(noHD  . Renal insufficiency   . Neuropathy       Past Surgical History  Procedure Laterality Date  . Appendectomy    . Carpal tunnel release  1992, 1993, 2011    Right in 2011; previously bilateral  . Tubal ligation  1976  . Partial hysterectomy  1985    Vaginal  . Shoulder surgery  1997    Left  . Incise and drain abcess  2010    Neck-MRSA  . Colonoscopy  02/2002, 07/2009    few  diverticula, otherwise negative  . Foot surgery  1984    Bilateral heel spurs  . Abdominal hysterectomy    . Shoulder surgery      RIGHT  ADJUSTED   . Av fistula placement Left 07/16/2012    Procedure: ARTERIOVENOUS (AV) FISTULA CREATION;  Surgeon: Mal Misty, MD;  Location: [place];  Service: Vascular;  Laterality: Left;  . Av fistula placement Left 07/29/2012    Procedure: ARTERIOVENOUS (AV) FISTULA CREATION;  Surgeon: Mal Misty, MD;  Location: [place];  Service: Vascular;  Laterality: Left;  Left Brachial-Cephalic Arteriovenous Fistula Creation  . Colonoscopy with esophagogastroduodenoscopy (egd) N/A 08/27/2012    RMR: Normal esophagus s/p 54 F dilator.  Small hiatal hernia. Gastric polyp removed, oozing (hyperplastic), which could explain heme positive stool:Colonic diverticulosis  . Revison of arteriovenous fistula Left 08/03/2013    Procedure: BANDING OF BRACHIOCEPHALIC ARTERIOVENOUS FISTULA;  Surgeon: Conrad Tucker, MD;  Location: [place];  Service: Vascular;  Laterality: Left;  . Thrombectomy w/ embolectomy Left 08/03/2013    Procedure: THROMBECTOMY OF ARTERIOVENOUS FISTULA  AND REVISION OF  ARTERIOVENOUS FISTULA BANDING.;  Surgeon: Conrad Comstock, MD;  Location: [place];  Service: Vascular;  Laterality: Left;    in for   Chief Complaint  Patient presents with  . Abdominal Pain     HPI  [name]  is a 63 y.o. female, with history of insulin-dependent type 2 diabetes mellitus, CK D. stage 3-4, hypertension, chronic pain, GERD, restless leg syndrome, obesity, gout, hypothyroidism, dyslipidemia, hypertension, COPD, anemia of chronic disease who was admitted to [facility] and discharged yesterday for right upper quadrant pain of unclear etiology, she had extensive workup including CT scan abdomen pelvis and HIDA scan both of which were unremarkable, she was seen by general surgery at [facility] and discharged on Cipro Flagyl for one week with outpatient GI followup. Patient  had limited oral intake but took her insulin, she continued to have abdominal pain which appears more epigastric and right upper quadrant and her oral intake was poor, she became more lethargic and came to the ER. Here she was found to be hypoglycemic and I was called to admit the patient for hypoglycemia, decreased oral intake and right upper quadrant abdominal pain.   Patient denies any fever chills, no headache, no chest pain palpitations cough phlegm shortness of breath, no blood in stool or urine no nausea vomiting, no weight loss unintentional, no focal weakness. She says she's been having more epigastric pain for the last 2 weeks, no nausea vomiting, no blood or black colored stool, no unintentional weight loss. Denies any previous history of peptic ulcer disease. Pain is nonradiating dull constant worse by eating food better with bowel rest.    Review of Systems    In addition to the HPI above,   No Fever-chills, No Headache, No changes with Vision or hearing, No problems swallowing food or Liquids, No Chest pain, Cough or Shortness of Breath, + Epigastric/ RUQ Abdominal pain, No Nausea or Vommitting, Bowel movements are regular, no black colored stool. No bloody stool. No Blood in stool or Urine, No dysuria, No new skin rashes or bruises, No new joints pains-aches,  No new weakness, tingling, numbness in any extremity, No recent weight gain or loss, No polyuria, polydypsia or polyphagia, No significant Mental Stressors.  A full 10 point Review of Systems was done, except as stated above, all other Review of Systems were negative.   Social History History  Substance Use Topics  . Smoking status: Never Smoker   . Smokeless tobacco: Never Used  . Alcohol Use: No      Family History Family History  Problem Relation Age of Onset  . Emphysema Mother   . COPD Mother   . Colon cancer Father   . Diabetes Father   . Cancer Father     colon  . Hypertension Father   .  Congestive Heart Failure Maternal Grandmother   . Cancer Paternal Grandmother       Prior to Admission medications   Medication Sig Start Date End Date Taking? Authorizing Provider  albuterol (PROAIR HFA) 108 (90 BASE) MCG/ACT inhaler Inhale 2 puffs into the  lungs every 6 (six) hours as needed for wheezing or shortness of breath. 07/04/13  Yes Vernie Shanks, MD  allopurinol (ZYLOPRIM) 100 MG tablet Take 1 tablet (100 mg total) by mouth every morning. 07/04/13  Yes Vernie Shanks, MD  amLODipine (NORVASC) 5 MG tablet Take 1 tablet (5 mg total) by mouth daily. 07/04/13  Yes Vernie Shanks, MD  carvedilol (COREG) 25 MG tablet Take 1 tablet (25 mg total) by mouth 2 (two) times daily with a meal. 07/04/13  Yes Vernie Shanks, MD  ciprofloxacin (CIPRO) 500 MG tablet Take 1 tablet (500 mg total) by mouth 2 (two) times daily. 10/25/13  Yes Shanker Kristeen Mans, MD  citalopram (CELEXA) 40 MG tablet Take 40 mg by mouth daily.   Yes Historical Provider, MD  clobetasol (TEMOVATE) 0.05 % external solution Apply 1 application topically daily as needed. Applied to scalp for psoriasis 12/20/12  Yes Historical Provider, MD  ferrous sulfate 325 (65 FE) MG tablet Take 1 tablet (325 mg total) by mouth daily with breakfast. 04/11/13  Yes Orvil Feil, NP  Fluticasone Furoate-Vilanterol (BREO ELLIPTA) 100-25 MCG/INH AEPB Inhale 1 puff into the lungs 2 (two) times daily.   Yes Historical Provider, MD  furosemide (LASIX) 40 MG tablet Take 40-80 mg by mouth 2 (two) times daily. 2 tablets in the morning and 1 tablet in the evening   Yes Historical Provider, MD  gabapentin (NEURONTIN) 800 MG tablet Take 1,600 mg by mouth at bedtime.    Yes Historical Provider, MD  HYDROcodone-acetaminophen (NORCO) 10-325 MG per tablet Take 1 tablet by mouth every 6 (six) hours as needed. 09/30/13  Yes Wardell Honour, MD  insulin aspart (NOVOLOG FLEXPEN) 100 UNIT/ML FlexPen Inject 45 Units into the skin 3 (three) times daily with meals.   Yes  Historical Provider, MD  Insulin Glargine (LANTUS SOLOSTAR) 100 UNIT/ML Solostar Pen Inject 30-60 Units into the skin 2 (two) times daily. 60 units in the morning and 30 units at night 07/04/13  Yes Vernie Shanks, MD  levothyroxine (SYNTHROID, LEVOTHROID) 50 MCG tablet Take 1 tablet (50 mcg total) by mouth every evening. 07/04/13  Yes Vernie Shanks, MD  linagliptin (TRADJENTA) 5 MG TABS tablet Take 5 mg by mouth daily.   Yes Historical Provider, MD  losartan (COZAAR) 50 MG tablet Take 50 mg by mouth daily.  06/13/13  Yes Historical Provider, MD  metroNIDAZOLE (FLAGYL) 500 MG tablet Take 1 tablet (500 mg total) by mouth 3 (three) times daily. 10/25/13  Yes Shanker Kristeen Mans, MD  nitroGLYCERIN (NITROSTAT) 0.4 MG SL tablet Place 1 tablet (0.4 mg total) under the tongue every 5 (five) minutes as needed for chest pain. 09/30/13  Yes Wardell Honour, MD  pantoprazole (PROTONIX) 40 MG tablet Take 1 tablet (40 mg total) by mouth daily. 10/25/13  Yes Shanker Kristeen Mans, MD  potassium chloride (K-DUR,KLOR-CON) 10 MEQ tablet Take 1 tablet (10 mEq total) by mouth 2 (two) times daily. 09/30/13  Yes Wardell Honour, MD  pravastatin (PRAVACHOL) 40 MG tablet Take 1 tablet (40 mg total) by mouth at bedtime. 09/30/13  Yes Wardell Honour, MD  rOPINIRole (REQUIP) 1 MG tablet Take 1 tablet (1 mg total) by mouth at bedtime. 07/04/13  Yes Vernie Shanks, MD  sodium bicarbonate 650 MG tablet Take 650 mg by mouth 3 (three) times daily.    Yes Historical Provider, MD  tiotropium (SPIRIVA HANDIHALER) 18 MCG inhalation capsule Place 1 capsule (18 mcg total)  into inhaler and inhale every morning. 07/04/13  Yes Vernie Shanks, MD  metoCLOPramide (REGLAN) 5 MG tablet Take 1 tablet (5 mg total) by mouth every 8 (eight) hours as needed for nausea. 10/25/13   Shanker Kristeen Mans, MD  oxyCODONE (ROXICODONE) 5 MG immediate release tablet Take 1 tablet (5 mg total) by mouth every 6 (six) hours as needed for severe pain. 10/25/13   Shanker Kristeen Mans,  MD    Allergies  Allergen Reactions  . Latex Shortness Of Breath and Swelling    Facial swelling  . Vancomycin Shortness Of Breath  . Vancomycin Hcl Swelling    REACTION: facial swelling  . Quinapril Hcl Swelling and Rash    Arm swelling    Physical Exam  Vitals  Blood pressure 133/70, pulse 60, temperature 99.7 F (37.6 C), temperature source Oral, resp. rate 19, height 5\' 7"  (1.702 m), weight 109.77 kg (242 lb), SpO2 98.00%.   1. General morbidly obese elderly white female lying in bed in NAD,    2. Normal affect and insight, Not Suicidal or Homicidal, Awake Alert, Oriented X 3.  3. No F.N deficits, ALL C.Nerves Intact, Strength 5/5 all 4 extremities, Sensation intact all 4 extremities, Plantars down going.  4. Ears and Eyes appear Normal, Conjunctivae clear, PERRLA. Moist Oral Mucosa.  5. Supple Neck, No JVD, No cervical lymphadenopathy appriciated, No Carotid Bruits.  6. Symmetrical Chest wall movement, Good air movement bilaterally, CTAB.  7. RRR, No Gallops, Rubs or Murmurs, No Parasternal Heave.  8. Positive Bowel Sounds, Abdomen Soft, mild epigastric tenderness, No organomegaly appriciated,No rebound -guarding or rigidity.  9.  No Cyanosis, Normal Skin Turgor, No Skin Rash or Bruise. Trace to 1+ bipedal edema which is chronic per patient  10. Good muscle tone,  joints appear normal , no effusions, Normal ROM.  11. No Palpable Lymph Nodes in Neck or Axillae     Data Review  CBC  Recent Labs Lab 10/22/13 1827 10/23/13 0300 10/24/13 0642 10/25/13 0600 10/26/13 1351  WBC 17.6* 19.2* 12.8* 11.4* 12.0*  HGB 11.9* 11.1* 12.4 11.8* 12.1  HCT 35.9* 34.4* 38.3 36.5 36.8  PLT 340 349 334 335 387  MCV 86.1 85.6 88.2 87.5 86.8  MCH 28.5 27.6 28.6 28.3 28.5  MCHC 33.1 32.3 32.4 32.3 32.9  RDW 14.6 14.6 15.2 15.1 15.1  LYMPHSABS 1.2  --   --   --  2.4  MONOABS 0.6  --   --   --  1.1*  EOSABS 0.0  --   --   --  0.4  BASOSABS 0.0  --   --   --  0.0    ------------------------------------------------------------------------------------------------------------------  Chemistries   Recent Labs Lab 10/22/13 1827 10/22/13 2007 10/23/13 0300 10/24/13 0642 10/25/13 0600 10/26/13 1351  NA 135*  --  137 146 140 142  K 3.9  --  4.5 3.6* 3.5* 3.3*  CL 98  --  97 105 101 104  CO2 23  --  24 26 24 26   GLUCOSE 345*  --  199* 70 159* 52*  BUN 27*  --  28* 25* 25* 22  CREATININE 1.67*  --  1.55* 1.53* 1.66* 1.55*  CALCIUM 9.0  --  8.9 8.7 8.4 9.0  AST  --  34 42* 28 19 19   ALT  --  32 33 36* 29 30  ALKPHOS  --  131* 113 102 101 112  BILITOT  --  0.3 0.4 0.5 0.5 0.4   ------------------------------------------------------------------------------------------------------------------  estimated creatinine clearance is 46.8 ml/min (by C-G formula based on Cr of 1.55). ------------------------------------------------------------------------------------------------------------------ No results found for this basename: TSH, T4TOTAL, FREET3, T3FREE, THYROIDAB,  in the last 72 hours   Coagulation profile No results found for this basename: INR, PROTIME,  in the last 168 hours ------------------------------------------------------------------------------------------------------------------- No results found for this basename: DDIMER,  in the last 72 hours -------------------------------------------------------------------------------------------------------------------  Cardiac Enzymes No results found for this basename: CK, CKMB, TROPONINI, MYOGLOBIN,  in the last 168 hours ------------------------------------------------------------------------------------------------------------------ No components found with this basename: POCBNP,    ---------------------------------------------------------------------------------------------------------------  Urinalysis    Component Value Date/Time   COLORURINE YELLOW 10/22/2013 2053   APPEARANCEUR  CLEAR 10/22/2013 2053   LABSPEC 1.020 10/22/2013 2053   PHURINE 6.0 10/22/2013 2053   GLUCOSEU 500* 10/22/2013 2053   GLUCOSEU >1000 12/27/2009   HGBUR TRACE* 10/22/2013 2053   BILIRUBINUR NEGATIVE 10/22/2013 2053   BILIRUBINUR neg 09/30/2013 [place] 10/22/2013 2053   PROTEINUR 100* 10/22/2013 2053   PROTEINUR neg 09/30/2013 1550   UROBILINOGEN 1.0 10/22/2013 2053   UROBILINOGEN negative 09/30/2013 1550   NITRITE NEGATIVE 10/22/2013 2053   NITRITE neg 09/30/2013 [place] 10/22/2013 2053    ----------------------------------------------------------------------------------------------------------------  Imaging results:   Ct Abdomen Pelvis Wo Contrast  10/25/2013   CLINICAL DATA:  Right upper quadrant pain  EXAM: CT ABDOMEN AND PELVIS WITHOUT CONTRAST  TECHNIQUE: Multidetector CT imaging of the abdomen and pelvis was performed following the standard protocol without IV contrast.  COMPARISON:  10/22/2013  FINDINGS: BODY WALL: Scattered medication injection sites.  LOWER CHEST: Trace pleural effusions, right more than left.  ABDOMEN/PELVIS:  Liver: No focal abnormality. There is unchanged mild fatty infiltration around the lower round ligament. Omental infarct in this location has been described in the literature.  Biliary: Unchanged infiltration of the pericholecystic fat. Cystic duct is patent per cholescintigraphy.  Pancreas: Unremarkable.  Spleen: Unremarkable.  Adrenals: Unremarkable.  Kidneys and ureters: Tiny left lower pole calculus. No hydronephrosis.  Bladder: Unremarkable.  Reproductive: Hysterectomy.  Negative adnexae.  Bowel: Distal colonic diverticulosis. Mid duodenal diverticulum. No active inflammation. No bowel obstruction. Negative appendix.  Retroperitoneum: No mass or adenopathy.  Peritoneum: Trace free fluid in the pelvis.  Vascular: No acute abnormality.  OSSEOUS: No acute abnormalities.  IMPRESSION: 1. Mild fat infiltration around the round ligament of the liver.  Fat infarct can occur in this location. 2. Unchanged pericholecystic edema. 3. Trace bilateral pleural effusion.   Electronically Signed   By: Jorje Guild M.D.   On: 10/25/2013 03:02   Ct Abdomen Pelvis Wo Contrast  10/22/2013   CLINICAL DATA:  Chest pain and shortness of breath, abdominal pain  EXAM: CT ABDOMEN AND PELVIS WITHOUT CONTRAST  TECHNIQUE: Multidetector CT imaging of the abdomen and pelvis was performed following the standard protocol without IV contrast.  COMPARISON:  None.  FINDINGS: Lung bases show mild atelectatic changes bilaterally.  The liver, spleen, adrenal glands and pancreas are within normal limits. The gallbladder is well distended and demonstrates some indistinctness of the wall which may be related wall thickening and wall edema. Kidneys are well visualized bilaterally but demonstrate some mild perinephric stranding. No obstructive changes are seen. No renal calculi are noted. Diverticular change is noted without diverticulitis. The appendix is within normal limits. The bladder is well distended. The uterus has been surgically removed. Degenerative changes of the lumbar spine are noted.  IMPRESSION: Indistinctness of the gallbladder wall which may be related to gallbladder wall edema  and inflammation. In the appropriate clinical setting this could represent acute cholecystitis. Ultrasound of the right upper quadrant may be helpful for further evaluation as necessary.   Electronically Signed   By: Inez Catalina M.D.   On: 10/22/2013 21:10   Dg Chest 2 View  10/22/2013   CLINICAL DATA:  Chronic shortness of breath, lower extremity swelling  EXAM: CHEST  2 VIEW  COMPARISON:  03/14/2013  FINDINGS: Pulmonary vascular congestion without frank interstitial edema. Mild bilateral lower lobe opacities, likely atelectasis. No pleural effusion or pneumothorax.  Cardiomegaly.  Mild degenerative changes of the visualized thoracolumbar spine.  IMPRESSION: Pulmonary vascular congestion without  frank interstitial edema.  Mild bilateral lower lobe opacities, likely atelectasis.   Electronically Signed   By: Julian Hy M.D.   On: 10/22/2013 18:16   Nm Hepato W/eject Fract  10/24/2013   CLINICAL DATA:  Abdominal pain, evaluate for cholecystitis  EXAM: NUCLEAR MEDICINE HEPATOBILIARY IMAGING WITH GALLBLADDER EF  TECHNIQUE: Sequential images of the abdomen were obtained out to 60 minutes following intravenous administration of radiopharmaceutical. After slow intravenous infusion of 2.2 micrograms Cholecystokinin, gallbladder ejection fraction was determined.  RADIOPHARMACEUTICALS:  5 [employer] [id number] Choletec  COMPARISON:  None  FINDINGS: There is immediate homogeneous uptake of radiotracer in the liver. Filling of the gallbladder begins at 20 minutes. Radiotracer uptake is present in the small bowel at 15 minutes.  At the end of 1-hour, there is relatively good clearance of activity from the liver.  When gallbladder filling was complete, the patient was given an IV infusion of CCK. At 30 min the gallbladder ejection fraction measures 83.5%. At 30 min, normal ejection fraction is greater than 30%.  The patient did not experience symptoms during CCK infusion.  IMPRESSION: 1. Normal hepatobiliary scan. 2. Normal gallbladder ejection fraction.   Electronically Signed   By: Kathreen Devoid   On: 10/24/2013 12:38   Mm Digital Diagnostic Bilat  10/18/2013   CLINICAL DATA:  Diffuse left breast pain.  EXAM: DIGITAL DIAGNOSTIC  bilateral MAMMOGRAM WITH CAD  COMPARISON:  12/31/2009, 12/27/2008.  ACR Breast Density Category b: There are scattered areas of fibroglandular density.  FINDINGS: There is no mass, distortion, or worrisome calcification within either breast. The parenchymal pattern is stable.  Mammographic images were processed with CAD.  IMPRESSION: No findings worrisome for malignancy. The patient's diffuse left breast pain is most likely on a hormonal basis.  RECOMMENDATION: Recommend bilateral  screening mammography 1 year.  I have discussed the findings and recommendations with the patient. Results were also provided in writing at the conclusion of the visit. If applicable, a reminder letter will be sent to the patient regarding the next appointment.  BI-RADS CATEGORY  1: Negative.   Electronically Signed   By: Luberta Robertson M.D.   On: 10/18/2013 15:04   US Abdomen Limited Ruq  10/23/2013   CLINICAL DATA:  Right upper quadrant pain  EXAM: US ABDOMEN LIMITED - RIGHT UPPER QUADRANT  COMPARISON:  None.  FINDINGS: Gallbladder:  Mild gallbladder wall thickening/ edema with trace pericholecystic fluid. No gallstones. Negative sonographic Murphy's sign.  Common bile duct:  Diameter: 4 mm.  Liver:  Hyperechoic hepatic parenchyma, suggesting hepatic steatosis.  IMPRESSION: Mild gallbladder wall thickening/edema, nonspecific, but without cholelithiasis or associated sonographic findings to suggest acute cholecystitis.  Suspected hepatic steatosis.   Electronically Signed   By: Julian Hy M.D.   On: 10/23/2013 12:06         Assessment & Plan   1.  Hypoglycemia due to insulin and decreaseoral intake - discontinue Lantus, IV glucagon, D50, D5W drip, monitor CBGs closely, for now on a sliding scale, check TSH, A1c and cortisol levels.   2. Ongoing epigastric/right upper quadrant abdominal pain with decreased oral intake. Etiology unclear. Recently had CT scan abdomen pelvis, HIDA scan, surgery evaluation 2 days ago at [facility], lipase her is unremarkable, liver enzymes are unremarkable. We'll have GI evaluate. Likely will benefit from EGD. Clear liquid diet for now. Supportive care. Switch to IV PPI, continue Cipro Flagyl to complete one week which was given upon discharge at [facility].   3. Dyslipidemia. Continue home dose statin.   4. Essential hypertension will continue Norvasc and Coreg combination and monitor. For now will hold ARB unless blood pressure too high.   5. CK  D. stage IV creatinine at baseline. Continue Lasix at home dose. Continue oral bicarbonate.    6. Depression. Continue Celexa.    7. Chronic pain. Home medications continued breakthrough pain will be treated with IV morphine as needed.    8. History of gout continue allopurinol.    9. COPD stable. Continue , oxygen nebulizer treatments on an as-needed basis.    10. Low potassium. Replace and ER. Recheck in the morning.    11. Chronic trace edema. Continue Lasix.       DVT Prophylaxis Heparin   AM Labs Ordered, also please review Full Orders  Family Communication: Admission, patients condition and plan of care including tests being ordered have been discussed with the patient  who indicates understanding and agree with the plan and Code Status.  Code Status full  Likely DC to  home  Condition Fair  Time spent in minutes : 35    Creek Gan K M.D on 10/26/2013 at 3:40 PM  Between 7am to 7pm - Pager - [phone]  After 7pm go to [email or web address] - password [email or web address]  And look for the night coverage person covering me after hours  [facility] Office  [phone]   **Disclaimer: This note may have been dictated with voice recognition software. Similar sounding words can inadvertently be transcribed and this note may contain transcription errors which may not have been corrected upon publication of note.**

## 2013-10-26 NOTE — ED Provider Notes (Signed)
CSN: [id number]     Arrival date & time 10/26/13  1208 History  This chart was scribed for Shaune Pollack, MD by Irene Pap, ED Scribe. This patient was seen in room APA07/APA07 and patient care was started at 1:29 PM.     Chief Complaint  Patient presents with  . Abdominal Pain   Patient is a 63 y.o. female presenting with abdominal pain. The history is provided by the patient. No language interpreter was used.  Abdominal Pain Pain location:  Epigastric Pain quality: fullness   Duration:  1 day Timing:  Constant Chronicity:  New Context: recent illness   Worsened by:  Deep breathing and eating Associated symptoms: nausea   Associated symptoms: no chills, no fever and no vomiting    HPI Comments: [name] is a 63 y.o. female with a history of chest pain and DM who presents to the Emergency Department complaining of abdominal pain onset one day ago. She states that every time she eats it feels like the food gets "stuck" and gives her pain that she reports causes bloating and tenderness. She rates the pain as 4/10. She reports associated nausea. She denies vomiting, fever, chills, hematochezia. She states that she has had bowel movements since yesterday. She states that she came in on Saturday where they questioned cholecystitis and was transferred to [facility] where they stated that it was not her gallbladder. She states that they removed the fluids and sent her home yesterday. She was told to consult with a GI. She states that she called Dr. Buford Dresser about the pain but was unable to get an appointment until Sept 14. She states that she had eaten pancakes after leaving the hospital and went to get her prescription. She states that she felt pain when walking after eating and has not eaten anything since. She states that her PCP is [place]. She denies change in appetite while in the hospital. She denies new medications. She reports history of abdominal surgeries  including tubes.   Past Medical History  Diagnosis Date  . Chest pain 2003    ?h/o MI, syncope; 2003 stress nuclear-inferoseptal scar, EF-54%; 2007-MI ruled out, echocardiogram-normal LV size, wall thickness and function  . RLS (restless legs syndrome)     sleep study in 2010: No apnea; moderate periodic movements  . GERD (gastroesophageal reflux disease)     H/o upper GI bleed  . GI bleeding   . Obese   . Gout   . Hypothyroidism   . Low back pain   . Cellulitis     Neck  . Diabetes mellitus 1998    A1c-7.1 in 2010, 6.7 in 2013  . Hyperlipidemia     Fasting lipids 2013:158, 152, 35, 93  . Hypertension 2002  . Anemia   . Hyperkalemia   . Irregular heart beat   . PONV (postoperative nausea and vomiting)   . Myocardial infarction   . CAD (coronary artery disease)     dr. Lattie Haw ([facility])  . Depression     Insomnia  . Headache(784.0)   . Vitiligo   . H/O hiatal hernia   . Arthritis   . Cancer     HX SKIN CANCER  . Anginal pain     last cp few months ago.  Marland Kitchen Dysrhythmia     "skips a beat"  . COPD (chronic obstructive pulmonary disease)   . Chronic kidney disease, stage III (moderate)     Creat-1.9 in 04/2008,  2.65 in 02/2010(noHD  . Renal insufficiency   . Neuropathy    Past Surgical History  Procedure Laterality Date  . Appendectomy    . Carpal tunnel release  1992, 1993, 2011    Right in 2011; previously bilateral  . Tubal ligation  1976  . Partial hysterectomy  1985    Vaginal  . Shoulder surgery  1997    Left  . Incise and drain abcess  2010    Neck-MRSA  . Colonoscopy  02/2002, 07/2009    few diverticula, otherwise negative  . Foot surgery  1984    Bilateral heel spurs  . Abdominal hysterectomy    . Shoulder surgery      RIGHT  ADJUSTED   . Av fistula placement Left 07/16/2012    Procedure: ARTERIOVENOUS (AV) FISTULA CREATION;  Surgeon: Mal Misty, MD;  Location: [place];  Service: Vascular;  Laterality: Left;  . Av fistula placement Left  07/29/2012    Procedure: ARTERIOVENOUS (AV) FISTULA CREATION;  Surgeon: Mal Misty, MD;  Location: [place];  Service: Vascular;  Laterality: Left;  Left Brachial-Cephalic Arteriovenous Fistula Creation  . Colonoscopy with esophagogastroduodenoscopy (egd) N/A 08/27/2012    RMR: Normal esophagus s/p 54 F dilator. Small hiatal hernia. Gastric polyp removed, oozing (hyperplastic), which could explain heme positive stool:Colonic diverticulosis  . Revison of arteriovenous fistula Left 08/03/2013    Procedure: BANDING OF BRACHIOCEPHALIC ARTERIOVENOUS FISTULA;  Surgeon: Conrad Harwood, MD;  Location: [place];  Service: Vascular;  Laterality: Left;  . Thrombectomy w/ embolectomy Left 08/03/2013    Procedure: THROMBECTOMY OF ARTERIOVENOUS FISTULA  AND REVISION OF  ARTERIOVENOUS FISTULA BANDING.;  Surgeon: Conrad Dixon, MD;  Location: [facility] OR;  Service: Vascular;  Laterality: Left;   Family History  Problem Relation Age of Onset  . Emphysema Mother   . COPD Mother   . Colon cancer Father   . Diabetes Father   . Cancer Father     colon  . Hypertension Father   . Congestive Heart Failure Maternal Grandmother   . Cancer Paternal Grandmother    History  Substance Use Topics  . Smoking status: Never Smoker   . Smokeless tobacco: Never Used  . Alcohol Use: No   OB History   Grav Para Term Preterm Abortions TAB SAB Ect Mult Living                 Review of Systems  Constitutional: Negative for fever and chills.  Gastrointestinal: Positive for nausea and abdominal pain. Negative for vomiting and blood in stool.  All other systems reviewed and are negative.  Allergies  Latex; Vancomycin; Vancomycin hcl; and Quinapril hcl  Home Medications   Prior to Admission medications   Medication Sig Start Date End Date Taking? Authorizing Provider  albuterol (PROAIR HFA) 108 (90 BASE) MCG/ACT inhaler Inhale 2 puffs into the lungs every 6 (six) hours as needed for wheezing or shortness of breath. 07/04/13  Yes  Vernie Shanks, MD  allopurinol (ZYLOPRIM) 100 MG tablet Take 1 tablet (100 mg total) by mouth every morning. 07/04/13  Yes Vernie Shanks, MD  amLODipine (NORVASC) 5 MG tablet Take 1 tablet (5 mg total) by mouth daily. 07/04/13  Yes Vernie Shanks, MD  carvedilol (COREG) 25 MG tablet Take 1 tablet (25 mg total) by mouth 2 (two) times daily with a meal. 07/04/13  Yes Vernie Shanks, MD  ciprofloxacin (CIPRO) 500 MG tablet Take 1 tablet (500 mg total) by mouth  2 (two) times daily. 10/25/13  Yes Shanker Kristeen Mans, MD  citalopram (CELEXA) 40 MG tablet Take 40 mg by mouth daily.   Yes Historical Provider, MD  clobetasol (TEMOVATE) 0.05 % external solution Apply 1 application topically daily as needed. Applied to scalp for psoriasis 12/20/12  Yes Historical Provider, MD  ferrous sulfate 325 (65 FE) MG tablet Take 1 tablet (325 mg total) by mouth daily with breakfast. 04/11/13  Yes Orvil Feil, NP  Fluticasone Furoate-Vilanterol (BREO ELLIPTA) 100-25 MCG/INH AEPB Inhale 1 puff into the lungs 2 (two) times daily.   Yes Historical Provider, MD  furosemide (LASIX) 40 MG tablet Take 40-80 mg by mouth 2 (two) times daily. 2 tablets in the morning and 1 tablet in the evening   Yes Historical Provider, MD  gabapentin (NEURONTIN) 800 MG tablet Take 1,600 mg by mouth at bedtime.    Yes Historical Provider, MD  HYDROcodone-acetaminophen (NORCO) 10-325 MG per tablet Take 1 tablet by mouth every 6 (six) hours as needed. 09/30/13  Yes Wardell Honour, MD  insulin aspart (NOVOLOG FLEXPEN) 100 UNIT/ML FlexPen Inject 45 Units into the skin 3 (three) times daily with meals.   Yes Historical Provider, MD  Insulin Glargine (LANTUS SOLOSTAR) 100 UNIT/ML Solostar Pen Inject 30-60 Units into the skin 2 (two) times daily. 60 units in the morning and 30 units at night 07/04/13  Yes Vernie Shanks, MD  levothyroxine (SYNTHROID, LEVOTHROID) 50 MCG tablet Take 1 tablet (50 mcg total) by mouth every evening. 07/04/13  Yes Vernie Shanks, MD   linagliptin (TRADJENTA) 5 MG TABS tablet Take 5 mg by mouth daily.   Yes Historical Provider, MD  losartan (COZAAR) 50 MG tablet Take 50 mg by mouth daily.  06/13/13  Yes Historical Provider, MD  metroNIDAZOLE (FLAGYL) 500 MG tablet Take 1 tablet (500 mg total) by mouth 3 (three) times daily. 10/25/13  Yes Shanker Kristeen Mans, MD  nitroGLYCERIN (NITROSTAT) 0.4 MG SL tablet Place 1 tablet (0.4 mg total) under the tongue every 5 (five) minutes as needed for chest pain. 09/30/13  Yes Wardell Honour, MD  pantoprazole (PROTONIX) 40 MG tablet Take 1 tablet (40 mg total) by mouth daily. 10/25/13  Yes Shanker Kristeen Mans, MD  potassium chloride (K-DUR,KLOR-CON) 10 MEQ tablet Take 1 tablet (10 mEq total) by mouth 2 (two) times daily. 09/30/13  Yes Wardell Honour, MD  pravastatin (PRAVACHOL) 40 MG tablet Take 1 tablet (40 mg total) by mouth at bedtime. 09/30/13  Yes Wardell Honour, MD  rOPINIRole (REQUIP) 1 MG tablet Take 1 tablet (1 mg total) by mouth at bedtime. 07/04/13  Yes Vernie Shanks, MD  sodium bicarbonate 650 MG tablet Take 650 mg by mouth 3 (three) times daily.    Yes Historical Provider, MD  tiotropium (SPIRIVA HANDIHALER) 18 MCG inhalation capsule Place 1 capsule (18 mcg total) into inhaler and inhale every morning. 07/04/13  Yes Vernie Shanks, MD  metoCLOPramide (REGLAN) 5 MG tablet Take 1 tablet (5 mg total) by mouth every 8 (eight) hours as needed for nausea. 10/25/13   Shanker Kristeen Mans, MD  oxyCODONE (ROXICODONE) 5 MG immediate release tablet Take 1 tablet (5 mg total) by mouth every 6 (six) hours as needed for severe pain. 10/25/13   Shanker Kristeen Mans, MD   BP 148/78  Pulse 69  Temp(Src) 99.7 F (37.6 C) (Oral)  Resp 16  Ht 5\' 7"  (1.702 m)  Wt 242 lb (109.77 kg)  BMI 37.89 kg/m2  SpO2 96%  Physical Exam  Nursing note and vitals reviewed. Constitutional: She is oriented to person, place, and time. She appears well-developed and well-nourished.  HENT:  Head: Normocephalic and atraumatic.   Right Ear: External ear normal.  Left Ear: External ear normal.  Nose: Nose normal.  Mouth/Throat: Oropharynx is clear and moist.  Eyes: Conjunctivae and EOM are normal. Pupils are equal, round, and reactive to light.  Neck: Normal range of motion. Neck supple.  Cardiovascular: Normal rate, regular rhythm, normal heart sounds and intact distal pulses.   Pulmonary/Chest: Effort normal and breath sounds normal.  Abdominal: Soft. Bowel sounds are normal. She exhibits distension. There is tenderness.  Musculoskeletal: Normal range of motion.  Neurological: She is alert and oriented to person, place, and time. She has normal reflexes. She displays normal reflexes. No cranial nerve deficit. Coordination normal.   Initial valuation patient unresponsive and blood sugar 47 patient received IV D50 and repeat examination revealed normal neurological  Skin: Skin is warm and dry.  Psychiatric: She has a normal mood and affect. Her behavior is normal. Judgment and thought content normal.    ED Course  Procedures (including critical care time) DIAGNOSTIC STUDIES: Oxygen Saturation is 96% on room air, normal by my interpretation.    COORDINATION OF CARE: 1:21 PM-Discussed treatment plan which includes  (CXR, CBC panel, CMP, UA) with pt at bedside and pt agreed to plan.   Labs Review Labs Reviewed  CBG MONITORING, ED - Abnormal; Notable for the following:    Glucose-Capillary 42 (*)    All other components within normal limits  CBG MONITORING, ED - Abnormal; Notable for the following:    Glucose-Capillary 145 (*)    All other components within normal limits   Imaging Review Ct Abdomen Pelvis Wo Contrast  10/25/2013   CLINICAL DATA:  Right upper quadrant pain  EXAM: CT ABDOMEN AND PELVIS WITHOUT CONTRAST  TECHNIQUE: Multidetector CT imaging of the abdomen and pelvis was performed following the standard protocol without IV contrast.  COMPARISON:  10/22/2013  FINDINGS: BODY WALL: Scattered medication  injection sites.  LOWER CHEST: Trace pleural effusions, right more than left.  ABDOMEN/PELVIS:  Liver: No focal abnormality. There is unchanged mild fatty infiltration around the lower round ligament. Omental infarct in this location has been described in the literature.  Biliary: Unchanged infiltration of the pericholecystic fat. Cystic duct is patent per cholescintigraphy.  Pancreas: Unremarkable.  Spleen: Unremarkable.  Adrenals: Unremarkable.  Kidneys and ureters: Tiny left lower pole calculus. No hydronephrosis.  Bladder: Unremarkable.  Reproductive: Hysterectomy.  Negative adnexae.  Bowel: Distal colonic diverticulosis. Mid duodenal diverticulum. No active inflammation. No bowel obstruction. Negative appendix.  Retroperitoneum: No mass or adenopathy.  Peritoneum: Trace free fluid in the pelvis.  Vascular: No acute abnormality.  OSSEOUS: No acute abnormalities.  IMPRESSION: 1. Mild fat infiltration around the round ligament of the liver. Fat infarct can occur in this location. 2. Unchanged pericholecystic edema. 3. Trace bilateral pleural effusion.   Electronically Signed   By: Jorje Guild M.D.   On: 10/25/2013 03:02     EKG Interpretation None      MDM   Final diagnoses:  Hypoglycemia  Epigastric pain   63 year-old female with renal insufficiency, congestive heart failure, and discharged from [facility] yesterday after admission for evaluation for right upper quadrant pain. During that admission she had an evaluation of her gallbladder and was felt not to have acute cholecystitis. She was referred to followup at GI. She  states she had pain that began again last night she has not been able to eat secondary to this. Pain is in the epigastrium. Her abdomen is diffusely tender. Labs are normal. She was hypoglycemic on my initial evaluation and has required repeat dosing of IV glucose to maintain blood sugars. This is presumably secondary to decreased by mouth intake. She had labs done  here did not show any acute elevation of lipase or liver enzymes or white blood cell count. Plain x-rays obtained do not show any evidence of small bowel obstruction. Patient now on d5 1/2 ns to maintain bs, discussed with Dr. Sloan Leiter and will add glucagon.    I personally performed the services described in this documentation, which was scribed in my presence. The recorded information has been reviewed and considered.   Shaune Pollack, MD 10/26/13 2229

## 2013-10-26 NOTE — ED Notes (Signed)
Pt states she was d/c from [facility] yesterday. States epigastric pain that was present while at [facility] and pt was told to follow up with Dr. Buford Dresser. Unable to see pt until Sept. 14th. The office told the patient that she was told the only way to see him was to come to the ED because he was on call. Pt states she can't wait until Sept 14th Pt states pain while walking, taking a deep breath, and that it feels like food gets stuck in area. NAD .

## 2013-10-26 NOTE — ED Notes (Signed)
Pt called staff into room and states "I feel like my sugar is dropping". Upon checking CBG was 42. Pt became lethargic. MD was notified and verbally ordered amp of D50.

## 2013-10-26 NOTE — ED Notes (Signed)
Pt c/o mid-upper abdominal pain "for awhile". Pt states "when I eat my food just stops right where it hurts". Pt denies v/d but does report intermittent nausea. Pt reports normal BMs. Last BM was this morning.

## 2013-10-27 ENCOUNTER — Encounter (HOSPITAL_COMMUNITY): Payer: Self-pay | Admitting: Gastroenterology

## 2013-10-27 ENCOUNTER — Encounter (HOSPITAL_COMMUNITY)
Admission: EM | Disposition: A | Discharge status: Home / Self Care | Payer: Self-pay | Source: Home / Self Care | Attending: Emergency Medicine

## 2013-10-27 DIAGNOSIS — I10 Essential (primary) hypertension: Secondary | ICD-10-CM

## 2013-10-27 DIAGNOSIS — E1165 Type 2 diabetes mellitus with hyperglycemia: Secondary | ICD-10-CM | POA: Diagnosis not present

## 2013-10-27 DIAGNOSIS — R1013 Epigastric pain: Secondary | ICD-10-CM

## 2013-10-27 DIAGNOSIS — IMO0002 Reserved for concepts with insufficient information to code with codable children: Secondary | ICD-10-CM | POA: Diagnosis not present

## 2013-10-27 DIAGNOSIS — E669 Obesity, unspecified: Secondary | ICD-10-CM

## 2013-10-27 HISTORY — PX: SAVORY DILATION: SHX5439

## 2013-10-27 HISTORY — PX: ESOPHAGOGASTRODUODENOSCOPY: SHX5428

## 2013-10-27 HISTORY — PX: MALONEY DILATION: SHX5535

## 2013-10-27 LAB — GLUCOSE, CAPILLARY
GLUCOSE-CAPILLARY: 100 mg/dL — AB (ref 70–99)
GLUCOSE-CAPILLARY: 105 mg/dL — AB (ref 70–99)
GLUCOSE-CAPILLARY: 111 mg/dL — AB (ref 70–99)
GLUCOSE-CAPILLARY: 119 mg/dL — AB (ref 70–99)
GLUCOSE-CAPILLARY: 134 mg/dL — AB (ref 70–99)
GLUCOSE-CAPILLARY: 136 mg/dL — AB (ref 70–99)
Glucose-Capillary: 103 mg/dL — ABNORMAL HIGH (ref 70–99)
Glucose-Capillary: 83 mg/dL (ref 70–99)

## 2013-10-27 LAB — BASIC METABOLIC PANEL
Anion gap: 10 (ref 5–15)
BUN: 21 mg/dL (ref 6–23)
CHLORIDE: 102 meq/L (ref 96–112)
CO2: 27 meq/L (ref 19–32)
Calcium: 8.4 mg/dL (ref 8.4–10.5)
Creatinine, Ser: 1.7 mg/dL — ABNORMAL HIGH (ref 0.50–1.10)
GFR calc Af Amer: 36 mL/min — ABNORMAL LOW (ref >90)
GFR calc non Af Amer: 31 mL/min — ABNORMAL LOW (ref >90)
GLUCOSE: 119 mg/dL — AB (ref 70–99)
POTASSIUM: 3.7 meq/L (ref 3.7–5.3)
SODIUM: 139 meq/L (ref 137–147)

## 2013-10-27 LAB — CBC
HCT: 33.2 % — ABNORMAL LOW (ref 36.0–46.0)
Hemoglobin: 10.7 g/dL — ABNORMAL LOW (ref 12.0–15.0)
MCH: 28.5 pg (ref 26.0–34.0)
MCHC: 32.2 g/dL (ref 30.0–36.0)
MCV: 88.5 fL (ref 78.0–100.0)
Platelets: 292 10*3/uL (ref 150–400)
RBC: 3.75 MIL/uL — AB (ref 3.87–5.11)
RDW: 15.1 % (ref 11.5–15.5)
WBC: 11.1 10*3/uL — ABNORMAL HIGH (ref 4.0–10.5)

## 2013-10-27 LAB — CORTISOL: Cortisol, Plasma: 20.9 ug/dL

## 2013-10-27 LAB — HEMOGLOBIN A1C
Hgb A1c MFr Bld: 11.8 % — ABNORMAL HIGH (ref <5.7)
Mean Plasma Glucose: 292 mg/dL — ABNORMAL HIGH (ref <117)

## 2013-10-27 LAB — TSH: TSH: 7.49 u[IU]/mL — ABNORMAL HIGH (ref 0.350–4.500)

## 2013-10-27 SURGERY — EGD (ESOPHAGOGASTRODUODENOSCOPY)
Anesthesia: Moderate Sedation

## 2013-10-27 MED ORDER — MEPERIDINE HCL 100 MG/ML IJ SOLN
INTRAMUSCULAR | Status: DC | PRN
  Administered 2013-10-27: 50 mg via INTRAVENOUS

## 2013-10-27 MED ORDER — ONDANSETRON HCL 4 MG/2ML IJ SOLN
INTRAMUSCULAR | Status: AC
  Filled 2013-10-27: qty 2

## 2013-10-27 MED ORDER — INSULIN ASPART 100 UNIT/ML ~~LOC~~ SOLN
0.0000 [IU] | SUBCUTANEOUS | Status: DC
  Administered 2013-10-27 – 2013-10-28 (×4): 1 [IU] via SUBCUTANEOUS

## 2013-10-27 MED ORDER — MIDAZOLAM HCL 5 MG/5ML IJ SOLN
INTRAMUSCULAR | Status: AC
  Filled 2013-10-27: qty 10

## 2013-10-27 MED ORDER — LIDOCAINE VISCOUS 2 % MT SOLN
OROMUCOSAL | Status: DC | PRN
  Administered 2013-10-27: 4 mL via OROMUCOSAL

## 2013-10-27 MED ORDER — LIDOCAINE VISCOUS 2 % MT SOLN
OROMUCOSAL | Status: AC
Start: 2013-10-27 — End: 2013-10-28
  Filled 2013-10-27: qty 15

## 2013-10-27 MED ORDER — SODIUM CHLORIDE 0.9 % IV SOLN
INTRAVENOUS | Status: DC
  Administered 2013-10-27: 15:00:00 via INTRAVENOUS

## 2013-10-27 MED ORDER — PROMETHAZINE HCL 25 MG/ML IJ SOLN
12.5000 mg | INTRAMUSCULAR | Status: AC
  Filled 2013-10-27: qty 1

## 2013-10-27 MED ORDER — MIDAZOLAM HCL 5 MG/5ML IJ SOLN
INTRAMUSCULAR | Status: DC | PRN
  Administered 2013-10-27: 2 mg via INTRAVENOUS

## 2013-10-27 MED ORDER — PROMETHAZINE HCL 25 MG/ML IJ SOLN
INTRAMUSCULAR | Status: DC | PRN
  Administered 2013-10-27: 12.5 mg via INTRAVENOUS

## 2013-10-27 MED ORDER — SODIUM CHLORIDE 0.9 % IJ SOLN
INTRAMUSCULAR | Status: AC
  Filled 2013-10-27: qty 10

## 2013-10-27 MED ORDER — MEPERIDINE HCL 100 MG/ML IJ SOLN
INTRAMUSCULAR | Status: AC
  Filled 2013-10-27: qty 2

## 2013-10-27 MED ORDER — CLOBETASOL PROPIONATE 0.05 % EX OINT
TOPICAL_OINTMENT | Freq: Two times a day (BID) | CUTANEOUS | Status: DC
  Administered 2013-10-28: 11:00:00 via TOPICAL
  Filled 2013-10-27: qty 15

## 2013-10-27 MED ORDER — SUCRALFATE 1 GM/10ML PO SUSP
1.0000 g | Freq: Three times a day (TID) | ORAL | Status: DC
  Administered 2013-10-27 – 2013-10-28 (×3): 1 g via ORAL
  Filled 2013-10-27 (×2): qty 10

## 2013-10-27 MED ORDER — ONDANSETRON HCL 4 MG/2ML IJ SOLN
INTRAMUSCULAR | Status: AC
Start: 2013-10-27 — End: 2013-10-28
  Filled 2013-10-27: qty 2

## 2013-10-27 MED ORDER — MIDAZOLAM HCL 5 MG/5ML IJ SOLN
INTRAMUSCULAR | Status: AC
Start: 2013-10-27 — End: 2013-10-28
  Filled 2013-10-27: qty 10

## 2013-10-27 MED ORDER — PROMETHAZINE HCL 25 MG/ML IJ SOLN
INTRAMUSCULAR | Status: AC
  Filled 2013-10-27: qty 1

## 2013-10-27 MED ORDER — DEXTROSE IN LACTATED RINGERS 5 % IV SOLN
INTRAVENOUS | Status: DC
  Administered 2013-10-27 (×2): via INTRAVENOUS

## 2013-10-27 MED ORDER — LIDOCAINE VISCOUS 2 % MT SOLN
OROMUCOSAL | Status: AC
  Filled 2013-10-27: qty 15

## 2013-10-27 MED ORDER — INSULIN ASPART 100 UNIT/ML ~~LOC~~ SOLN: 0.0000 [IU] | SUBCUTANEOUS | Status: DC

## 2013-10-27 NOTE — Care Management Note (Signed)
    Page 1 of 1   10/27/2013     1:05:37 PM CARE MANAGEMENT NOTE 10/27/2013  Patient:  [name]   Account Number:  [id number]  Date Initiated:  10/27/2013  Documentation initiated by:  CHILDRESS,JESSICA  Subjective/Objective Assessment:   Patient presented with complaints of abdominal pain. Patient from home, lives with daughter. Patient is independent at home.     Action/Plan:   Patient plans to discharge home with self care. No CM needs noted at this time.   Anticipated DC Date:  10/28/2013   Anticipated DC Plan:  [place]  CM consult      Choice offered to / List presented to:             Status of service:  Completed, signed off Medicare Important Message given?   (If response is "NO", the following Medicare IM given date fields will be blank) Date Medicare IM given:   Medicare IM given by:   Date Additional Medicare IM given:   Additional Medicare IM given by:    Discharge Disposition:  HOME/SELF CARE  Per UR Regulation:    If discussed at Long Length of Stay Meetings, dates discussed:    Comments:  10/27/2013 [place], RN, MSN, [facility]

## 2013-10-27 NOTE — Progress Notes (Signed)
[facility] PROGRESS NOTE  CYTLALI BIZZELL [id number] DOB: 14-Nov-1950 DOA: 10/26/2013 PCP: Wardell Honour, MD  Assessment/Plan: 1. Hypoglycemia due to insulin and decreaseoral intake  - discontinued Lantus, on IV glucagon, D50, D5W drip - monitor CBGs closely -on a sliding scale - TSH elevated - will follow up free T4 - A1c of 11.8   2. Ongoing epigastric/right upper quadrant abdominal pain with decreased oral intake.  - Etiology unclear.  - Recently had CT scan abdomen pelvis, HIDA scan, surgery evaluation 2 days ago at [facility] - lipase her is unremarkable - liver enzymes are unremarkable.  - GI consulted with plans for EGD today - Switch to IV PPI,  - continuing Cipro Flagyl to complete one week which was given upon discharge at [facility].   3. Dyslipidemia.  - Continue home dose statin.   4. Essential hypertension  - continue Norvasc and Coreg - ARB was held on admit   5. CK D. stage IV creatinine at baseline.  - Continue Lasix at home dose.  - Continue oral bicarbonate.   6. Depression.  - Continue Celexa.   7. Chronic pain.  - Home medications continued - PRN IV morphine ordered  8. History of gout - continue allopurinol.   9. COPD stable.  - Oxygen nebulizer treatments PRN  10. Low potassium.  - Replaced in ER. - Follow and replace as needed  11. Chronic trace edema.  - Continue Lasix as tolerated  Code Status: Full Family Communication: Pt in room (indicate person spoken with, relationship, and if by phone, the number) Disposition Plan: Pending   Consultants:  GI  Procedures:  EGD pending  Antibiotics:  Cipro/flagyl  HPI/Subjective: Complains of continued upper abd/epigastric pain  Objective: Filed Vitals:   10/26/13 1740 10/26/13 2009 10/26/13 2048 10/27/13 0451  BP: 143/60  142/66 107/59  Pulse: 60  59 57  Temp: 98.1 F (36.7 C)  98 F (36.7 C) 97.5 F (36.4 C)  TempSrc: Oral  Oral Oral  Resp: 20  24 20    Height: 5\' 7"  (1.702 m)     Weight: 110.406 kg (243 lb 6.4 oz)   112.674 kg (248 lb 6.4 oz)  SpO2: 95% 94% 96% 92%    Intake/Output Summary (Last 24 hours) at 10/27/13 1150 Last data filed at 10/26/13 2049  Gross per 24 hour  Intake    240 ml  Output    300 ml  Net    -60 ml   Filed Weights   10/26/13 1213 10/26/13 1740 10/27/13 0451  Weight: 109.77 kg (242 lb) 110.406 kg (243 lb 6.4 oz) 112.674 kg (248 lb 6.4 oz)    Exam:   General:  Awake, in nad  Cardiovascular: regular, s1, s2  Respiratory: normal resp effort, no wheezing  Abdomen: soft, generally tender, pos bs  Musculoskeletal: perfused, no clubbing   Data Reviewed: Basic Metabolic Panel:  Recent Labs Lab 10/23/13 0300 10/24/13 0642 10/25/13 0600 10/26/13 1351 10/27/13 0539  NA 137 146 140 142 139  K 4.5 3.6* 3.5* 3.3* 3.7  CL 97 105 101 104 102  CO2 24 26 24 26 27   GLUCOSE 199* 70 159* 52* 119*  BUN 28* 25* 25* 22 21  CREATININE 1.55* 1.53* 1.66* 1.55* 1.70*  CALCIUM 8.9 8.7 8.4 9.0 8.4   Liver Function Tests:  Recent Labs Lab 10/22/13 2007 10/23/13 0300 10/24/13 0642 10/25/13 0600 10/26/13 1351  AST 34 42* 28 19 19   ALT 32 33 36* 29  30  ALKPHOS 131* 113 102 101 112  BILITOT 0.3 0.4 0.5 0.5 0.4  PROT 7.6 7.5 7.4 6.7 7.6  ALBUMIN 3.2* 3.2* 3.2* 2.9* 3.3*    Recent Labs Lab 10/22/13 2007 10/26/13 1351  LIPASE 13 24   No results found for this basename: AMMONIA,  in the last 168 hours CBC:  Recent Labs Lab 10/22/13 1827 10/23/13 0300 10/24/13 0642 10/25/13 0600 10/26/13 1351 10/27/13 0539  WBC 17.6* 19.2* 12.8* 11.4* 12.0* 11.1*  NEUTROABS 15.8*  --   --   --  8.1*  --   HGB 11.9* 11.1* 12.4 11.8* 12.1 10.7*  HCT 35.9* 34.4* 38.3 36.5 36.8 33.2*  MCV 86.1 85.6 88.2 87.5 86.8 88.5  PLT 340 349 334 335 387 292   Cardiac Enzymes: No results found for this basename: CKTOTAL, CKMB, CKMBINDEX, TROPONINI,  in the last 168 hours BNP (last 3 results)  Recent Labs   10/22/13 1827  PROBNP 2668.0*   CBG:  Recent Labs Lab 10/26/13 2051 10/27/13 0003 10/27/13 0450 10/27/13 0750 10/27/13 1138  GLUCAP 86 134* 119* 103* 100*    Recent Results (from the past 240 hour(s))  SURGICAL PCR SCREEN     Status: None   Collection Time    10/23/13  2:28 AM      Result Value Ref Range Status   MRSA, PCR NEGATIVE  NEGATIVE Final   Staphylococcus aureus NEGATIVE  NEGATIVE Final   Comment:            The Xpert SA Assay (FDA     approved for NASAL specimens     in patients over 17 years of age),     is one component of     a comprehensive surveillance     program.  Test performance has     been validated by [employer] for patients greater     than or equal to 65 year old.     It is not intended     to diagnose infection nor to     guide or monitor treatment.     Studies: Dg Abd Acute W/chest  10/26/2013   CLINICAL DATA:  Abdominal pain  EXAM: ACUTE ABDOMEN SERIES (ABDOMEN 2 VIEW & CHEST 1 VIEW)  COMPARISON:  CT abdomen and pelvis October 24, 2013; chest radiograph October 22, 2013  FINDINGS: PA chest: There is mild interstitial edema in the bases with small effusions. Heart is mildly prominent with normal pulmonary vascularity. No airspace consolidation.  Supine and upright abdomen: Bowel gas pattern is unremarkable. No obstruction or free air. There are small phleboliths in the pelvis.  IMPRESSION: Unremarkable bowel gas pattern. Evidence of a degree of congestive heart failure. No airspace consolidation.   Electronically Signed   By: Lowella Grip M.D.   On: 10/26/2013 15:49    Scheduled Meds: . allopurinol  100 mg Oral q morning - 10a  . amLODipine  5 mg Oral Daily  . carvedilol  25 mg Oral BID WC  . ciprofloxacin  500 mg Oral BID  . citalopram  40 mg Oral Daily  . clobetasol ointment   Topical BID  . ferrous sulfate  325 mg Oral Q breakfast  . Fluticasone Furoate-Vilanterol  1 puff Inhalation BID  . furosemide  40 mg Oral QPM  . furosemide   80 mg Oral q morning - 10a  . gabapentin  1,600 mg Oral QHS  . insulin aspart  0-9 Units Subcutaneous Q4H  .  levothyroxine  50 mcg Oral QPM  . linagliptin  5 mg Oral Daily  . metroNIDAZOLE  500 mg Oral TID  . pantoprazole (PROTONIX) IV  40 mg Intravenous Q12H  . potassium chloride  10 mEq Oral BID  . promethazine  12.5 mg Intravenous On Call  . rOPINIRole  1 mg Oral QHS  . simvastatin  5 mg Oral q1800  . sodium bicarbonate  650 mg Oral TID  . sodium chloride  3 mL Intravenous Q12H  . tiotropium  18 mcg Inhalation q morning - 10a   Continuous Infusions:   Principal Problem:   Hypoglycemia Active Problems:   Hypothyroidism   Hyperlipidemia   Obesity   Hypertension   Gastroesophageal reflux disease   RLS (restless legs syndrome)   Chronic kidney disease, stage IV (severe)   DM type 2, uncontrolled, with neuropathy   Psoriasis  Time spent: 96min  Milderd Manocchio, [facility] [facility] Pager [phone]. If 7PM-7AM, please contact night-coverage at [email or web address], password [facility] 10/27/2013, 11:50 AM  LOS: 1 day

## 2013-10-27 NOTE — Progress Notes (Signed)
UR completed 

## 2013-10-27 NOTE — Op Note (Signed)
[facility] [address] [place], [address]   ENDOSCOPY PROCEDURE REPORT  PATIENT: [name], [name]  MR#: [id number] BIRTHDATE: Jun 13, 1950 , 55  yrs. old GENDER: Female ENDOSCOPIST: R.  Garfield Cornea, MD FACP [facility] REFERRED BY:  Alain Honey, M.D. PROCEDURE DATE:  10/27/2013 PROCEDURE:     EGD with gastric biopsy  INDICATIONS:     Epigastric pain  INFORMED CONSENT:   The risks, benefits, limitations, alternatives and imponderables have been discussed.  The potential for biopsy, esophogeal dilation, etc. have also been reviewed.  Questions have been answered.  All parties agreeable.  Please see the history and physical in the medical record for more information.  MEDICATIONS:      Versed 2 mg IV and Demerol 50 mg IV in divided doses. Phenergan 12.5 mg IV. Xylocaine gel orally  DESCRIPTION OF PROCEDURE:   The [id number]  endoscope was introduced through the mouth and advanced to the second portion of the duodenum without difficulty or limitations.  The mucosal surfaces were surveyed very carefully during advancement of the scope and upon withdrawal.  Retroflexion view of the proximal stomach and esophagogastric junction was performed.      FINDINGS:    esophageal mucosa appeared normal. Tubular esophagus widely patent throughout its course. Stomach empty. Patient had longitudinal proximal gastric erosions overlying multiple proximal folds. Nodular mucosa with overlying antral erosions as well. No ulcer or infiltrating process seen. Patent pylorus. Normal first and second portion of the duodenum.  THERAPEUTIC / DIAGNOSTIC MANEUVERS PERFORMED:  Biopsies of the abnormal gastric mucosa taken for histologic study  COMPLICATIONS:  None  IMPRESSION:  Focal gastric erosions as described above. status post gastric biopsy. I am not convinced findings on today's endoscopy have anything to do with her symptoms. I suppose she could have  acalculous cholecystitis (presentation would be somewhat unusual). Mesenteric fat infarct remains in the differential.  RECOMMENDATIONS:  Clear liquid diet. Continue acid suppression via PPI. Add Carafate suspension 4 times a day. Followup on pathology. If no improvement in the next 24-48 hours or if condition worsens in the interim, I would have general surgery come back and reassess..    _______________________________ R. Garfield Cornea, MD FACP [facility] eSigned:  R. Garfield Cornea, MD FACP [facility] 10/27/2013 4:59 PM     CC:

## 2013-10-27 NOTE — Consult Note (Signed)
Referring Provider: Dr. Candiss Norse Primary Care Physician:  Wardell Honour, MD Primary Gastroenterologist:  Dr. Gala Romney   Date of Admission: 10/26/13 Date of Consultation: 10/27/13  Reason for Consultation:  Abdominal pain  HPI:  [name] is a 63 year old female last seen by our practice in July 2014 with history of heme positive stool and anemia. Colonoscopy and EGD performed June 2014 with oozing gastric polyp noted, hyperplastic path. Empiric dilation performed at time of EGD. TCS with colonic diverticulosis; she will need repeat colonoscopy in 2019. Anemia felt to be multifactorial with chronic disease and possible early IDA. Admitted to [facility] several days ago with RUQ pain and then discharged after extensive work-up to include CT and HIDA. General surgery recommended further GI evaluation prior to elective cholecystectomy. Admitted to [facility] the day after discharge from [facility] with persistent epigastric/RUQ pain.   Now patient notes she is having epigastric pain and LUQ pain. RUQ pain resolved. Feels like food stops in epigastrum and she "blows up", gets tight. Pain is constant. No relieving factors. Walking hurts. No breakfast this morning. Intermittent nausea but no vomiting. No melena. No hematochezia. No NSAIDs or aspirin powders. Denies reflux exacerbations. No dysphagia. No constipation or diarrhea. Was on Omeprazole prior to admission to [facility]. Now on Protonix daily.   Past Medical History  Diagnosis Date  . Chest pain 2003    ?h/o MI, syncope; 2003 stress nuclear-inferoseptal scar, EF-54%; 2007-MI ruled out, echocardiogram-normal LV size, wall thickness and function  . RLS (restless legs syndrome)     sleep study in 2010: No apnea; moderate periodic movements  . GERD (gastroesophageal reflux disease)     H/o upper GI bleed  . GI bleeding   . Obese   . Gout   . Hypothyroidism   . Low back pain   . Cellulitis     Neck  . Diabetes mellitus 1998    A1c-7.1 in 2010, 6.7  in 2013  . Hyperlipidemia     Fasting lipids 2013:158, 152, 35, 93  . Hypertension 2002  . Anemia   . Hyperkalemia   . Irregular heart beat   . PONV (postoperative nausea and vomiting)   . Myocardial infarction   . CAD (coronary artery disease)     dr. Lattie Haw (Linna Hoff)  . Depression     Insomnia  . Headache(784.0)   . Vitiligo   . H/O hiatal hernia   . Arthritis   . Cancer     HX SKIN CANCER  . Anginal pain     last cp few months ago.  Marland Kitchen Dysrhythmia     "skips a beat"  . COPD (chronic obstructive pulmonary disease)   . Chronic kidney disease, stage III (moderate)     Creat-1.9 in 04/2008, 2.65 in 02/2010(noHD  . Renal insufficiency   . Neuropathy     Past Surgical History  Procedure Laterality Date  . Appendectomy    . Carpal tunnel release  1992, 1993, 2011    Right in 2011; previously bilateral  . Tubal ligation  1976  . Partial hysterectomy  1985    Vaginal  . Shoulder surgery  1997    Left  . Incise and drain abcess  2010    Neck-MRSA  . Colonoscopy  02/2002, 07/2009    few diverticula, otherwise negative  . Foot surgery  1984    Bilateral heel spurs  . Abdominal hysterectomy    . Shoulder surgery  RIGHT  ADJUSTED   . Av fistula placement Left 07/16/2012    Procedure: ARTERIOVENOUS (AV) FISTULA CREATION;  Surgeon: Mal Misty, MD;  Location: [place];  Service: Vascular;  Laterality: Left;  . Av fistula placement Left 07/29/2012    Procedure: ARTERIOVENOUS (AV) FISTULA CREATION;  Surgeon: Mal Misty, MD;  Location: [place];  Service: Vascular;  Laterality: Left;  Left Brachial-Cephalic Arteriovenous Fistula Creation  . Colonoscopy with esophagogastroduodenoscopy (egd) N/A 08/27/2012    RMR: Normal esophagus s/p 54 F dilator. Small hiatal hernia. Gastric polyp removed, oozing (hyperplastic), which could explain heme positive stool:Colonic diverticulosis  . Revison of arteriovenous fistula Left 08/03/2013    Procedure: BANDING OF BRACHIOCEPHALIC  ARTERIOVENOUS FISTULA;  Surgeon: Conrad South Henderson, MD;  Location: [place];  Service: Vascular;  Laterality: Left;  . Thrombectomy w/ embolectomy Left 08/03/2013    Procedure: THROMBECTOMY OF ARTERIOVENOUS FISTULA  AND REVISION OF  ARTERIOVENOUS FISTULA BANDING.;  Surgeon: Conrad Samoa, MD;  Location: [place];  Service: Vascular;  Laterality: Left;    Prior to Admission medications   Medication Sig Start Date End Date Taking? Authorizing Provider  albuterol (PROAIR HFA) 108 (90 BASE) MCG/ACT inhaler Inhale 2 puffs into the lungs every 6 (six) hours as needed for wheezing or shortness of breath. 07/04/13  Yes Vernie Shanks, MD  allopurinol (ZYLOPRIM) 100 MG tablet Take 1 tablet (100 mg total) by mouth every morning. 07/04/13  Yes Vernie Shanks, MD  amLODipine (NORVASC) 5 MG tablet Take 1 tablet (5 mg total) by mouth daily. 07/04/13  Yes Vernie Shanks, MD  carvedilol (COREG) 25 MG tablet Take 1 tablet (25 mg total) by mouth 2 (two) times daily with a meal. 07/04/13  Yes Vernie Shanks, MD  ciprofloxacin (CIPRO) 500 MG tablet Take 1 tablet (500 mg total) by mouth 2 (two) times daily. 10/25/13  Yes Shanker Kristeen Mans, MD  citalopram (CELEXA) 40 MG tablet Take 40 mg by mouth daily.   Yes Historical Provider, MD  clobetasol (TEMOVATE) 0.05 % external solution Apply 1 application topically daily as needed. Applied to scalp for psoriasis 12/20/12  Yes Historical Provider, MD  ferrous sulfate 325 (65 FE) MG tablet Take 1 tablet (325 mg total) by mouth daily with breakfast. 04/11/13  Yes Orvil Feil, NP  Fluticasone Furoate-Vilanterol (BREO ELLIPTA) 100-25 MCG/INH AEPB Inhale 1 puff into the lungs 2 (two) times daily.   Yes Historical Provider, MD  furosemide (LASIX) 40 MG tablet Take 40-80 mg by mouth 2 (two) times daily. 2 tablets in the morning and 1 tablet in the evening   Yes Historical Provider, MD  gabapentin (NEURONTIN) 800 MG tablet Take 1,600 mg by mouth at bedtime.    Yes Historical Provider, MD   HYDROcodone-acetaminophen (NORCO) 10-325 MG per tablet Take 1 tablet by mouth every 6 (six) hours as needed. 09/30/13  Yes Wardell Honour, MD  insulin aspart (NOVOLOG FLEXPEN) 100 UNIT/ML FlexPen Inject 45 Units into the skin 3 (three) times daily with meals.   Yes Historical Provider, MD  Insulin Glargine (LANTUS SOLOSTAR) 100 UNIT/ML Solostar Pen Inject 30-60 Units into the skin 2 (two) times daily. 60 units in the morning and 30 units at night 07/04/13  Yes Vernie Shanks, MD  levothyroxine (SYNTHROID, LEVOTHROID) 50 MCG tablet Take 1 tablet (50 mcg total) by mouth every evening. 07/04/13  Yes Vernie Shanks, MD  linagliptin (TRADJENTA) 5 MG TABS tablet Take 5 mg by mouth daily.  Yes Historical Provider, MD  losartan (COZAAR) 50 MG tablet Take 50 mg by mouth daily.  06/13/13  Yes Historical Provider, MD  metroNIDAZOLE (FLAGYL) 500 MG tablet Take 1 tablet (500 mg total) by mouth 3 (three) times daily. 10/25/13  Yes Shanker Kristeen Mans, MD  nitroGLYCERIN (NITROSTAT) 0.4 MG SL tablet Place 1 tablet (0.4 mg total) under the tongue every 5 (five) minutes as needed for chest pain. 09/30/13  Yes Wardell Honour, MD  pantoprazole (PROTONIX) 40 MG tablet Take 1 tablet (40 mg total) by mouth daily. 10/25/13  Yes Shanker Kristeen Mans, MD  potassium chloride (K-DUR,KLOR-CON) 10 MEQ tablet Take 1 tablet (10 mEq total) by mouth 2 (two) times daily. 09/30/13  Yes Wardell Honour, MD  pravastatin (PRAVACHOL) 40 MG tablet Take 1 tablet (40 mg total) by mouth at bedtime. 09/30/13  Yes Wardell Honour, MD  rOPINIRole (REQUIP) 1 MG tablet Take 1 tablet (1 mg total) by mouth at bedtime. 07/04/13  Yes Vernie Shanks, MD  sodium bicarbonate 650 MG tablet Take 650 mg by mouth 3 (three) times daily.    Yes Historical Provider, MD  tiotropium (SPIRIVA HANDIHALER) 18 MCG inhalation capsule Place 1 capsule (18 mcg total) into inhaler and inhale every morning. 07/04/13  Yes Vernie Shanks, MD  metoCLOPramide (REGLAN) 5 MG tablet Take 1  tablet (5 mg total) by mouth every 8 (eight) hours as needed for nausea. 10/25/13   Shanker Kristeen Mans, MD  oxyCODONE (ROXICODONE) 5 MG immediate release tablet Take 1 tablet (5 mg total) by mouth every 6 (six) hours as needed for severe pain. 10/25/13   Shanker Kristeen Mans, MD    Current Facility-Administered Medications  Medication Dose Route Frequency Provider Last Rate Last Dose  . albuterol (PROVENTIL) (2.5 MG/3ML) 0.083% nebulizer solution 3 mL  3 mL Inhalation Q6H PRN Thurnell Lose, MD      . allopurinol (ZYLOPRIM) tablet 100 mg  100 mg Oral q morning - 10a Thurnell Lose, MD      . amLODipine (NORVASC) tablet 5 mg  5 mg Oral Daily Thurnell Lose, MD      . carvedilol (COREG) tablet 25 mg  25 mg Oral BID WC Thurnell Lose, MD   25 mg at 10/26/13 1849  . ciprofloxacin (CIPRO) tablet 500 mg  500 mg Oral BID Thurnell Lose, MD   500 mg at 10/26/13 2030  . citalopram (CELEXA) tablet 40 mg  40 mg Oral Daily Thurnell Lose, MD   40 mg at 10/26/13 1848  . clobetasol ointment (TEMOVATE) 0.05 %   Topical BID Donne Hazel, MD      . ferrous sulfate tablet 325 mg  325 mg Oral Q breakfast Thurnell Lose, MD      . Fluticasone Furoate-Vilanterol 100-25 MCG/INH AEPB 1 puff  1 puff Inhalation BID Thurnell Lose, MD   1 puff at 10/26/13 2200  . furosemide (LASIX) tablet 40 mg  40 mg Oral QPM Thurnell Lose, MD   40 mg at 10/26/13 1849  . furosemide (LASIX) tablet 80 mg  80 mg Oral q morning - 10a Thurnell Lose, MD      . gabapentin (NEURONTIN) capsule 1,600 mg  1,600 mg Oral QHS Thurnell Lose, MD   1,600 mg at 10/26/13 2301  . guaiFENesin-dextromethorphan (ROBITUSSIN DM) 100-10 MG/5ML syrup 5 mL  5 mL Oral Q4H PRN Thurnell Lose, MD      . heparin  injection 5,000 Units  5,000 Units Subcutaneous 3 times per day Thurnell Lose, MD   5,000 Units at 10/27/13 0607  . HYDROcodone-acetaminophen (NORCO/VICODIN) 5-325 MG per tablet 1-2 tablet  1-2 tablet Oral Q4H PRN Thurnell Lose, MD    2 tablet at 10/27/13 [phone]  . insulin aspart (novoLOG) injection 0-9 Units  0-9 Units Subcutaneous Q4H Thurnell Lose, MD      . levothyroxine (SYNTHROID, LEVOTHROID) tablet 50 mcg  50 mcg Oral QPM Thurnell Lose, MD      . linagliptin (TRADJENTA) tablet 5 mg  5 mg Oral Daily Thurnell Lose, MD      . metoCLOPramide (REGLAN) tablet 5 mg  5 mg Oral Q8H PRN Thurnell Lose, MD      . metroNIDAZOLE (FLAGYL) tablet 500 mg  500 mg Oral TID Thurnell Lose, MD   500 mg at 10/26/13 2253  . morphine 2 MG/ML injection 1 mg  1 mg Intravenous Q4H PRN Thurnell Lose, MD      . nitroGLYCERIN (NITROSTAT) SL tablet 0.4 mg  0.4 mg Sublingual Q5 min PRN Thurnell Lose, MD      . ondansetron (ZOFRAN) tablet 4 mg  4 mg Oral Q6H PRN Thurnell Lose, MD       Or  . ondansetron (ZOFRAN) injection 4 mg  4 mg Intravenous Q6H PRN Thurnell Lose, MD      . oxyCODONE (Oxy IR/ROXICODONE) immediate release tablet 5 mg  5 mg Oral Q6H PRN Thurnell Lose, MD   5 mg at 10/26/13 1847  . pantoprazole (PROTONIX) injection 40 mg  40 mg Intravenous Q12H Thurnell Lose, MD   40 mg at 10/26/13 2254  . polyethylene glycol (MIRALAX / GLYCOLAX) packet 17 g  17 g Oral Daily PRN Thurnell Lose, MD      . potassium chloride (K-DUR,KLOR-CON) CR tablet 10 mEq  10 mEq Oral BID Thurnell Lose, MD   10 mEq at 10/26/13 2254  . rOPINIRole (REQUIP) tablet 1 mg  1 mg Oral QHS Thurnell Lose, MD   1 mg at 10/26/13 2254  . simvastatin (ZOCOR) tablet 5 mg  5 mg Oral q1800 Thurnell Lose, MD   5 mg at 10/26/13 1849  . sodium bicarbonate tablet 650 mg  650 mg Oral TID Thurnell Lose, MD   650 mg at 10/26/13 2254  . sodium chloride 0.9 % injection 3 mL  3 mL Intravenous Q12H Thurnell Lose, MD   3 mL at 10/26/13 2305  . tiotropium (SPIRIVA) inhalation capsule 18 mcg  18 mcg Inhalation q morning - 10a Thurnell Lose, MD        Allergies as of 10/26/2013 - Review Complete 10/26/2013  Allergen Reaction Noted  . Latex  Shortness Of Breath and Swelling 01/22/2009  . Vancomycin Shortness Of Breath 09/02/2013  . Vancomycin hcl Swelling 01/22/2009  . Quinapril hcl Swelling and Rash     Family History  Problem Relation Age of Onset  . Emphysema Mother   . COPD Mother   . Colon cancer Father   . Diabetes Father   . Cancer Father     colon  . Hypertension Father   . Congestive Heart Failure Maternal Grandmother   . Cancer Paternal Grandmother     History   Social History  . Marital Status: Divorced    Spouse Name: N/A    Number of Children: 3  . Years of Education:  N/A   Occupational History  . Disabled     previously Nurse, adult   Social History Main Topics  . Smoking status: Never Smoker   . Smokeless tobacco: Never Used  . Alcohol Use: No  . Drug Use: No  . Sexual Activity: No   Other Topics Concern  . Not on file   Social History Narrative   No regular exercise    Review of Systems: Gen: Denies fever, chills, loss of appetite, change in weight or weight loss CV: palpitations occasionally Resp: +DOE GI: Denies dysphagia or odynophagia. Denies vomiting blood, jaundice, and fecal incontinence.  GU : Denies urinary burning, urinary frequency, urinary incontinence.  MS: Denies joint pain,swelling, cramping Derm: Denies rash, itching, dry skin Psych: +depression/anxiety at baseline Heme: Denies bruising, bleeding, and enlarged lymph nodes.  Physical Exam: Vital signs in last 24 hours: Temp:  [97.5 F (36.4 C)-99.7 F (37.6 C)] 97.5 F (36.4 C) (08/06 0451) Pulse Rate:  [57-69] 57 (08/06 0451) Resp:  [14-24] 20 (08/06 0451) BP: (106-148)/(59-78) 107/59 mmHg (08/06 0451) SpO2:  [92 %-99 %] 92 % (08/06 0451) Weight:  [242 lb (109.77 kg)-248 lb 6.4 oz (112.674 kg)] 248 lb 6.4 oz (112.674 kg) (08/06 0451) Last BM Date: 10/26/13 General:   Alert,  Well-developed, well-nourished, pleasant and cooperative in NAD Head:  Normocephalic and atraumatic. Eyes:  Sclera clear, no  icterus.   Conjunctiva pink. Ears:  Mild HOH Nose:  No deformity, discharge,  or lesions. Mouth:  No deformity or lesions Lungs:  Clear throughout to auscultation.   No wheezes, crackles, or rhonchi. No acute distress. Heart:  Regular rate and rhythm; Abdomen:  Soft, obese, TTP epigastric and LUQ, Full. Difficult to appreciate HSM due to large AP diameter. Msk:  Symmetrical without gross deformities. Normal posture. Extremities:  1+ edema bilateral lower extremities. Right hand swollen after IV infiltrate Neurologic:  Alert and  oriented x4;  grossly normal neurologically. Skin:  Intact without significant lesions or rashes. Psych:  Alert and cooperative. Normal mood and affect.  Intake/Output from previous day: 08/05 0701 - 08/06 0700 In: 240 [P.O.:240] Out: 300 [Urine:300] Intake/Output this shift:    Lab Results:  Recent Labs  10/25/13 0600 10/26/13 1351 10/27/13 0539  WBC 11.4* 12.0* 11.1*  HGB 11.8* 12.1 10.7*  HCT 36.5 36.8 33.2*  PLT 335 387 292   BMET  Recent Labs  10/25/13 0600 10/26/13 1351 10/27/13 0539  NA 140 142 139  K 3.5* 3.3* 3.7  CL 101 104 102  CO2 24 26 27   GLUCOSE 159* 52* 119*  BUN 25* 22 21  CREATININE 1.66* 1.55* 1.70*  CALCIUM 8.4 9.0 8.4   LFT  Recent Labs  10/25/13 0600 10/26/13 1351  PROT 6.7 7.6  ALBUMIN 2.9* 3.3*  AST 19 19  ALT 29 30  ALKPHOS 101 112  BILITOT 0.5 0.4    Studies/Results: Dg Abd Acute W/chest  10/26/2013   CLINICAL DATA:  Abdominal pain  EXAM: ACUTE ABDOMEN SERIES (ABDOMEN 2 VIEW & CHEST 1 VIEW)  COMPARISON:  CT abdomen and pelvis October 24, 2013; chest radiograph October 22, 2013  FINDINGS: PA chest: There is mild interstitial edema in the bases with small effusions. Heart is mildly prominent with normal pulmonary vascularity. No airspace consolidation.  Supine and upright abdomen: Bowel gas pattern is unremarkable. No obstruction or free air. There are small phleboliths in the pelvis.  IMPRESSION: Unremarkable  bowel gas pattern. Evidence of a degree of congestive heart failure. No airspace consolidation.  Electronically Signed   By: Lowella Grip M.D.   On: 10/26/2013 15:49   8/4 CT at [facility]:  10/25/2013 CLINICAL DATA: Right upper quadrant pain EXAM: CT ABDOMEN AND PELVIS WITHOUT CONTRAST TECHNIQUE: Multidetector CT imaging of the abdomen and pelvis was performed following the standard protocol without IV contrast. COMPARISON: 10/22/2013 FINDINGS: BODY WALL: Scattered medication injection sites. LOWER CHEST: Trace pleural effusions, right more than left. ABDOMEN/PELVIS: Liver: No focal abnormality. There is unchanged mild fatty infiltration around the lower round ligament. Omental infarct in this location has been described in the literature. Biliary: Unchanged infiltration of the pericholecystic fat. Cystic duct is patent per cholescintigraphy. Pancreas: Unremarkable. Spleen: Unremarkable. Adrenals: Unremarkable. Kidneys and ureters: Tiny left lower pole calculus. No hydronephrosis. Bladder: Unremarkable. Reproductive: Hysterectomy. Negative adnexae. Bowel: Distal colonic diverticulosis. Mid duodenal diverticulum. No active inflammation. No bowel obstruction. Negative appendix. Retroperitoneum: No mass or adenopathy. Peritoneum: Trace free fluid in the pelvis. Vascular: No acute abnormality. OSSEOUS: No acute abnormalities. IMPRESSION: 1. Mild fat infiltration around the round ligament of the liver. Fat infarct can occur in this location. 2. Unchanged pericholecystic edema. 3. Trace bilateral pleural effusion. Electronically Signed By: Jorje Guild M.D. On: 10/25/2013 03:02     Impression: 63 year old female with approximately week long history of RUQ and epigastric pain, with extensive radiologic evaluation at [facility] just prior to admission to [facility] including CT, [place] of abdomen, and HIDA scan. HIDA negative for biliary dyskinesia, no concerns for cholecystitis. Recommend further GI evaluation before elective  cholecystectomy. Now with constant LUQ and epigastric pain, noting the sensation of food "stopping" in upper abdomen. No true esophageal dysphagia. No evidence of melena, hematochezia, loss of appetite. Intermittent nausea associated with pain. Although last EGD was in June 2014, fairly recently, would be beneficial to reevaluate her upper GI tract in the way of an upper endoscopy with possible dilation if appropriate. As of note, symptoms seem out of proportion to physical findings. She is agreeable to an upper endoscopy +/- dilation today. Risks and benefits discussed with stated understanding.  She received Heparin at 0600 this morning. This should not delay her procedure; will hold the afternoon dose for now.    Plan: NPO EGD/ED with Dr. Gala Romney later today. WILL ORDER PHENERGAN 12.5 mg IV ON CALL DUE TO POLYPHARMACY Hold Heparin dose this afternoon Further recommendations to follow after EGD   Orvil Feil, ANP-BC [facility]     LOS: 1 day    10/27/2013, 10:16 AM   Attending note:  Patient seen and examined in short stay. She really denies true dysphagia to solids or liquids. Agree with need to further evaluation of pain. Will proceed with an EGD this afternoon. Discussed risks, benefits, limitations and alternatives with patient. Questions answered. She is agreeable.

## 2013-10-28 DIAGNOSIS — E1165 Type 2 diabetes mellitus with hyperglycemia: Secondary | ICD-10-CM | POA: Diagnosis not present

## 2013-10-28 DIAGNOSIS — IMO0002 Reserved for concepts with insufficient information to code with codable children: Secondary | ICD-10-CM | POA: Diagnosis not present

## 2013-10-28 LAB — BASIC METABOLIC PANEL
ANION GAP: 10 (ref 5–15)
BUN: 19 mg/dL (ref 6–23)
CALCIUM: 8.7 mg/dL (ref 8.4–10.5)
CO2: 27 mEq/L (ref 19–32)
Chloride: 103 mEq/L (ref 96–112)
Creatinine, Ser: 1.75 mg/dL — ABNORMAL HIGH (ref 0.50–1.10)
GFR calc Af Amer: 34 mL/min — ABNORMAL LOW (ref >90)
GFR calc non Af Amer: 30 mL/min — ABNORMAL LOW (ref >90)
Glucose, Bld: 128 mg/dL — ABNORMAL HIGH (ref 70–99)
Potassium: 4.3 mEq/L (ref 3.7–5.3)
SODIUM: 140 meq/L (ref 137–147)

## 2013-10-28 LAB — CBC
HCT: 33.5 % — ABNORMAL LOW (ref 36.0–46.0)
HEMOGLOBIN: 10.7 g/dL — AB (ref 12.0–15.0)
MCH: 28.4 pg (ref 26.0–34.0)
MCHC: 31.9 g/dL (ref 30.0–36.0)
MCV: 88.9 fL (ref 78.0–100.0)
PLATELETS: 311 10*3/uL (ref 150–400)
RBC: 3.77 MIL/uL — AB (ref 3.87–5.11)
RDW: 15.3 % (ref 11.5–15.5)
WBC: 9.1 10*3/uL (ref 4.0–10.5)

## 2013-10-28 LAB — GLUCOSE, CAPILLARY
GLUCOSE-CAPILLARY: 108 mg/dL — AB (ref 70–99)
GLUCOSE-CAPILLARY: 124 mg/dL — AB (ref 70–99)
Glucose-Capillary: 130 mg/dL — ABNORMAL HIGH (ref 70–99)
Glucose-Capillary: 130 mg/dL — ABNORMAL HIGH (ref 70–99)

## 2013-10-28 LAB — T4, FREE: FREE T4: 1.16 ng/dL (ref 0.80–1.80)

## 2013-10-28 NOTE — Discharge Summary (Signed)
Physician Discharge Summary  [name] [id number] DOB: Oct 25, 1950 DOA: 10/26/2013  PCP: Wardell Honour, MD  Admit date: 10/26/2013 Discharge date: 10/28/2013  Time spent: 35 minutes  Recommendations for Outpatient Follow-up:  1. Follow up with PCP in one week 2.  Follow up with Nephrology as scheduled  Discharge Diagnoses:  Principal Problem:   Hypoglycemia Active Problems:   Hypothyroidism   Hyperlipidemia   Obesity   Hypertension   Gastroesophageal reflux disease   RLS (restless legs syndrome)   Chronic kidney disease, stage IV (severe)   DM type 2, uncontrolled, with neuropathy   Psoriasis  Discharge Condition: Improved  Diet recommendation: Diabetic  Filed Weights   10/26/13 1740 10/27/13 0451 10/28/13 0520  Weight: 110.406 kg (243 lb 6.4 oz) 112.674 kg (248 lb 6.4 oz) 110.78 kg (244 lb 3.6 oz)    History of present illness:  Please see admit h and p from 8/5 for details. Briefly, pt presents with epigastric pain with hypoglycemia related to resulting decreased appetite.   Hospital Course:  1. Hypoglycemia due to insulin and decreaseoral intake  - discontinued Lantus, on IV glucagon, D50, D5W drip  - monitor CBGs closely  - on a sliding scale - TSH elevated - free T4 pending - would have results followed up as an outpatient - A1c of 11.8  2. Ongoing epigastric/right upper quadrant abdominal pain with decreased oral intake.  - Etiology not certain  - Recently had CT scan abdomen pelvis, HIDA scan, surgery evaluation 2 days ago at [facility]  - lipase unremarkable  - liver enzymes unremarkable.  - GI consulted and is s/p EGD 8/6 - continued Cipro Flagyl to complete one week which was given upon discharge at [facility].   3. Dyslipidemia.  - Continue home dose statin.   4. Essential hypertension  - continue Norvasc and Coreg  - ARB was temporarily held on admit   5. CK D. stage IV creatinine at baseline.  - Continue Lasix at home dose.  -  Continue oral bicarbonate.   6. Depression.  - Continue Celexa.   7. Chronic pain.  - Home medications continued  - PRN IV morphine while in the hospital  8. History of gout  - continue allopurinol.   9. COPD stable.  - Oxygen nebulizer treatments PRN   10. Low potassium.  - Replaced and corrected in ER.   11. Chronic trace edema.  - Continued Lasix as tolerated  Procedures:  EGD 8/6: Focal gastric erosions s/p gastric biopsy  Consultations:  GI  Discharge Exam: Filed Vitals:   10/27/13 1733 10/27/13 2052 10/28/13 0506 10/28/13 0520  BP: 119/62 122/59 124/60   Pulse: 56 56 58   Temp: 98 F (36.7 C) 97.7 F (36.5 C) 98.1 F (36.7 C)   TempSrc:  Oral Oral   Resp: 16 16 16    Height:      Weight:    110.78 kg (244 lb 3.6 oz)  SpO2: 95% 95% 94%     General: Awake, in nad Cardiovascular: regular, s1, s2 Respiratory: normal resp effort, no wheezing  Discharge Instructions     Medication List         albuterol 108 (90 BASE) MCG/ACT inhaler  Commonly known as:  PROAIR HFA  Inhale 2 puffs into the lungs every 6 (six) hours as needed for wheezing or shortness of breath.     allopurinol 100 MG tablet  Commonly known as:  ZYLOPRIM  Take 1 tablet (100 mg  total) by mouth every morning.     amLODipine 5 MG tablet  Commonly known as:  NORVASC  Take 1 tablet (5 mg total) by mouth daily.     BREO ELLIPTA 100-25 MCG/INH Aepb  Generic drug:  Fluticasone Furoate-Vilanterol  Inhale 1 puff into the lungs 2 (two) times daily.     carvedilol 25 MG tablet  Commonly known as:  COREG  Take 1 tablet (25 mg total) by mouth 2 (two) times daily with a meal.     ciprofloxacin 500 MG tablet  Commonly known as:  CIPRO  Take 1 tablet (500 mg total) by mouth 2 (two) times daily.     citalopram 40 MG tablet  Commonly known as:  CELEXA  Take 40 mg by mouth daily.     clobetasol 0.05 % external solution  Commonly known as:  TEMOVATE  Apply 1 application topically daily as  needed. Applied to scalp for psoriasis     ferrous sulfate 325 (65 FE) MG tablet  Take 1 tablet (325 mg total) by mouth daily with breakfast.     furosemide 40 MG tablet  Commonly known as:  LASIX  Take 40-80 mg by mouth 2 (two) times daily. 2 tablets in the morning and 1 tablet in the evening     gabapentin 800 MG tablet  Commonly known as:  NEURONTIN  Take 1,600 mg by mouth at bedtime.     HYDROcodone-acetaminophen 10-325 MG per tablet  Commonly known as:  NORCO  Take 1 tablet by mouth every 6 (six) hours as needed.     LANTUS SOLOSTAR 100 UNIT/ML Solostar Pen  Generic drug:  Insulin Glargine  Inject 30-60 Units into the skin 2 (two) times daily. 60 units in the morning and 30 units at night     levothyroxine 50 MCG tablet  Commonly known as:  SYNTHROID, LEVOTHROID  Take 1 tablet (50 mcg total) by mouth every evening.     linagliptin 5 MG Tabs tablet  Commonly known as:  TRADJENTA  Take 5 mg by mouth daily.     losartan 50 MG tablet  Commonly known as:  COZAAR  Take 50 mg by mouth daily.     metoCLOPramide 5 MG tablet  Commonly known as:  REGLAN  Take 1 tablet (5 mg total) by mouth every 8 (eight) hours as needed for nausea.     metroNIDAZOLE 500 MG tablet  Commonly known as:  FLAGYL  Take 1 tablet (500 mg total) by mouth 3 (three) times daily.     nitroGLYCERIN 0.4 MG SL tablet  Commonly known as:  NITROSTAT  Place 1 tablet (0.4 mg total) under the tongue every 5 (five) minutes as needed for chest pain.     NOVOLOG FLEXPEN 100 UNIT/ML FlexPen  Generic drug:  insulin aspart  Inject 45 Units into the skin 3 (three) times daily with meals.     oxyCODONE 5 MG immediate release tablet  Commonly known as:  ROXICODONE  Take 1 tablet (5 mg total) by mouth every 6 (six) hours as needed for severe pain.     pantoprazole 40 MG tablet  Commonly known as:  PROTONIX  Take 1 tablet (40 mg total) by mouth daily.     potassium chloride 10 MEQ tablet  Commonly known as:   K-DUR,KLOR-CON  Take 1 tablet (10 mEq total) by mouth 2 (two) times daily.     pravastatin 40 MG tablet  Commonly known as:  PRAVACHOL  Take 1 tablet (  40 mg total) by mouth at bedtime.     rOPINIRole 1 MG tablet  Commonly known as:  REQUIP  Take 1 tablet (1 mg total) by mouth at bedtime.     sodium bicarbonate 650 MG tablet  Take 650 mg by mouth 3 (three) times daily.     tiotropium 18 MCG inhalation capsule  Commonly known as:  SPIRIVA HANDIHALER  Place 1 capsule (18 mcg total) into inhaler and inhale every morning.       Allergies  Allergen Reactions  . Latex Shortness Of Breath and Swelling    Facial swelling  . Vancomycin Shortness Of Breath  . Vancomycin Hcl Swelling    REACTION: facial swelling  . Quinapril Hcl Swelling and Rash    Arm swelling   Follow-up Information   Follow up with Wardell Honour, MD. Schedule an appointment as soon as possible for a visit in 1 week.   Specialty:  Family Medicine   Contact information:   [address] [place] [address] [phone]        The results of significant diagnostics from this hospitalization (including imaging, microbiology, ancillary and laboratory) are listed below for reference.    Significant Diagnostic Studies: Ct Abdomen Pelvis Wo Contrast  10/25/2013   CLINICAL DATA:  Right upper quadrant pain  EXAM: CT ABDOMEN AND PELVIS WITHOUT CONTRAST  TECHNIQUE: Multidetector CT imaging of the abdomen and pelvis was performed following the standard protocol without IV contrast.  COMPARISON:  10/22/2013  FINDINGS: BODY WALL: Scattered medication injection sites.  LOWER CHEST: Trace pleural effusions, right more than left.  ABDOMEN/PELVIS:  Liver: No focal abnormality. There is unchanged mild fatty infiltration around the lower round ligament. Omental infarct in this location has been described in the literature.  Biliary: Unchanged infiltration of the pericholecystic fat. Cystic duct is patent per cholescintigraphy.   Pancreas: Unremarkable.  Spleen: Unremarkable.  Adrenals: Unremarkable.  Kidneys and ureters: Tiny left lower pole calculus. No hydronephrosis.  Bladder: Unremarkable.  Reproductive: Hysterectomy.  Negative adnexae.  Bowel: Distal colonic diverticulosis. Mid duodenal diverticulum. No active inflammation. No bowel obstruction. Negative appendix.  Retroperitoneum: No mass or adenopathy.  Peritoneum: Trace free fluid in the pelvis.  Vascular: No acute abnormality.  OSSEOUS: No acute abnormalities.  IMPRESSION: 1. Mild fat infiltration around the round ligament of the liver. Fat infarct can occur in this location. 2. Unchanged pericholecystic edema. 3. Trace bilateral pleural effusion.   Electronically Signed   By: Jorje Guild M.D.   On: 10/25/2013 03:02   Ct Abdomen Pelvis Wo Contrast  10/22/2013   CLINICAL DATA:  Chest pain and shortness of breath, abdominal pain  EXAM: CT ABDOMEN AND PELVIS WITHOUT CONTRAST  TECHNIQUE: Multidetector CT imaging of the abdomen and pelvis was performed following the standard protocol without IV contrast.  COMPARISON:  None.  FINDINGS: Lung bases show mild atelectatic changes bilaterally.  The liver, spleen, adrenal glands and pancreas are within normal limits. The gallbladder is well distended and demonstrates some indistinctness of the wall which may be related wall thickening and wall edema. Kidneys are well visualized bilaterally but demonstrate some mild perinephric stranding. No obstructive changes are seen. No renal calculi are noted. Diverticular change is noted without diverticulitis. The appendix is within normal limits. The bladder is well distended. The uterus has been surgically removed. Degenerative changes of the lumbar spine are noted.  IMPRESSION: Indistinctness of the gallbladder wall which may be related to gallbladder wall edema and inflammation. In the appropriate  clinical setting this could represent acute cholecystitis. Ultrasound of the right upper quadrant  may be helpful for further evaluation as necessary.   Electronically Signed   By: Inez Catalina M.D.   On: 10/22/2013 21:10   Dg Chest 2 View  10/22/2013   CLINICAL DATA:  Chronic shortness of breath, lower extremity swelling  EXAM: CHEST  2 VIEW  COMPARISON:  03/14/2013  FINDINGS: Pulmonary vascular congestion without frank interstitial edema. Mild bilateral lower lobe opacities, likely atelectasis. No pleural effusion or pneumothorax.  Cardiomegaly.  Mild degenerative changes of the visualized thoracolumbar spine.  IMPRESSION: Pulmonary vascular congestion without frank interstitial edema.  Mild bilateral lower lobe opacities, likely atelectasis.   Electronically Signed   By: Julian Hy M.D.   On: 10/22/2013 18:16   Nm Hepato W/eject Fract  10/24/2013   CLINICAL DATA:  Abdominal pain, evaluate for cholecystitis  EXAM: NUCLEAR MEDICINE HEPATOBILIARY IMAGING WITH GALLBLADDER EF  TECHNIQUE: Sequential images of the abdomen were obtained out to 60 minutes following intravenous administration of radiopharmaceutical. After slow intravenous infusion of 2.2 micrograms Cholecystokinin, gallbladder ejection fraction was determined.  RADIOPHARMACEUTICALS:  5 [place] 123XX123 Choletec  COMPARISON:  None  FINDINGS: There is immediate homogeneous uptake of radiotracer in the liver. Filling of the gallbladder begins at 20 minutes. Radiotracer uptake is present in the small bowel at 15 minutes.  At the end of 1-hour, there is relatively good clearance of activity from the liver.  When gallbladder filling was complete, the patient was given an IV infusion of CCK. At 30 min the gallbladder ejection fraction measures 83.5%. At 30 min, normal ejection fraction is greater than 30%.  The patient did not experience symptoms during CCK infusion.  IMPRESSION: 1. Normal hepatobiliary scan. 2. Normal gallbladder ejection fraction.   Electronically Signed   By: Kathreen Devoid   On: 10/24/2013 12:38   Dg Abd Acute  W/chest  10/26/2013   CLINICAL DATA:  Abdominal pain  EXAM: ACUTE ABDOMEN SERIES (ABDOMEN 2 VIEW & CHEST 1 VIEW)  COMPARISON:  CT abdomen and pelvis October 24, 2013; chest radiograph October 22, 2013  FINDINGS: PA chest: There is mild interstitial edema in the bases with small effusions. Heart is mildly prominent with normal pulmonary vascularity. No airspace consolidation.  Supine and upright abdomen: Bowel gas pattern is unremarkable. No obstruction or free air. There are small phleboliths in the pelvis.  IMPRESSION: Unremarkable bowel gas pattern. Evidence of a degree of congestive heart failure. No airspace consolidation.   Electronically Signed   By: Lowella Grip M.D.   On: 10/26/2013 15:49   Mm Digital Diagnostic Bilat  10/18/2013   CLINICAL DATA:  Diffuse left breast pain.  EXAM: DIGITAL DIAGNOSTIC  bilateral MAMMOGRAM WITH CAD  COMPARISON:  12/31/2009, 12/27/2008.  ACR Breast Density Category b: There are scattered areas of fibroglandular density.  FINDINGS: There is no mass, distortion, or worrisome calcification within either breast. The parenchymal pattern is stable.  Mammographic images were processed with CAD.  IMPRESSION: No findings worrisome for malignancy. The patient's diffuse left breast pain is most likely on a hormonal basis.  RECOMMENDATION: Recommend bilateral screening mammography 1 year.  I have discussed the findings and recommendations with the patient. Results were also provided in writing at the conclusion of the visit. If applicable, a reminder letter will be sent to the patient regarding the next appointment.  BI-RADS CATEGORY  1: Negative.   Electronically Signed   By: Luberta Robertson M.D.   On: 10/18/2013 15:04  US Abdomen Limited Ruq  10/23/2013   CLINICAL DATA:  Right upper quadrant pain  EXAM: US ABDOMEN LIMITED - RIGHT UPPER QUADRANT  COMPARISON:  None.  FINDINGS: Gallbladder:  Mild gallbladder wall thickening/ edema with trace pericholecystic fluid. No gallstones.  Negative sonographic Murphy's sign.  Common bile duct:  Diameter: 4 mm.  Liver:  Hyperechoic hepatic parenchyma, suggesting hepatic steatosis.  IMPRESSION: Mild gallbladder wall thickening/edema, nonspecific, but without cholelithiasis or associated sonographic findings to suggest acute cholecystitis.  Suspected hepatic steatosis.   Electronically Signed   By: Julian Hy M.D.   On: 10/23/2013 12:06    Microbiology: Recent Results (from the past 240 hour(s))  SURGICAL PCR SCREEN     Status: None   Collection Time    10/23/13  2:28 AM      Result Value Ref Range Status   MRSA, PCR NEGATIVE  NEGATIVE Final   Staphylococcus aureus NEGATIVE  NEGATIVE Final   Comment:            The Xpert SA Assay (FDA     approved for NASAL specimens     in patients over 45 years of age),     is one component of     a comprehensive surveillance     program.  Test performance has     been validated by [employer] for patients greater     than or equal to 28 year old.     It is not intended     to diagnose infection nor to     guide or monitor treatment.     Labs: Basic Metabolic Panel:  Recent Labs Lab 10/24/13 0642 10/25/13 0600 10/26/13 1351 10/27/13 0539 10/28/13 0607  NA 146 140 142 139 140  K 3.6* 3.5* 3.3* 3.7 4.3  CL 105 101 104 102 103  CO2 26 24 26 27 27   GLUCOSE 70 159* 52* 119* 128*  BUN 25* 25* 22 21 19   CREATININE 1.53* 1.66* 1.55* 1.70* 1.75*  CALCIUM 8.7 8.4 9.0 8.4 8.7   Liver Function Tests:  Recent Labs Lab 10/22/13 2007 10/23/13 0300 10/24/13 0642 10/25/13 0600 10/26/13 1351  AST 34 42* 28 19 19   ALT 32 33 36* 29 30  ALKPHOS 131* 113 102 101 112  BILITOT 0.3 0.4 0.5 0.5 0.4  PROT 7.6 7.5 7.4 6.7 7.6  ALBUMIN 3.2* 3.2* 3.2* 2.9* 3.3*    Recent Labs Lab 10/22/13 2007 10/26/13 1351  LIPASE 13 24   No results found for this basename: AMMONIA,  in the last 168 hours CBC:  Recent Labs Lab 10/22/13 1827  10/24/13 0642 10/25/13 0600  10/26/13 1351 10/27/13 0539 10/28/13 0607  WBC 17.6*  < > 12.8* 11.4* 12.0* 11.1* 9.1  NEUTROABS 15.8*  --   --   --  8.1*  --   --   HGB 11.9*  < > 12.4 11.8* 12.1 10.7* 10.7*  HCT 35.9*  < > 38.3 36.5 36.8 33.2* 33.5*  MCV 86.1  < > 88.2 87.5 86.8 88.5 88.9  PLT 340  < > 334 335 387 292 311  < > = values in this interval not displayed. Cardiac Enzymes: No results found for this basename: CKTOTAL, CKMB, CKMBINDEX, TROPONINI,  in the last 168 hours BNP: BNP (last 3 results)  Recent Labs  10/22/13 1827  PROBNP 2668.0*   CBG:  Recent Labs Lab 10/27/13 1737 10/27/13 2050 10/28/13 0009 10/28/13 0503 10/28/13 0719  GLUCAP 105* 136* 130*  130* 108*   Signed:  CHIU, STEPHEN K  [facility] 10/28/2013, 11:32 AM

## 2013-10-28 NOTE — Progress Notes (Signed)
Ur review complete.  

## 2013-10-28 NOTE — Plan of Care (Signed)
GI doctor - please see physican sticky note to call daughter.

## 2013-10-28 NOTE — Progress Notes (Signed)
    Subjective: Abdominal pain resolved. No N/V. Wants to advance diet.   Objective: Vital signs in last 24 hours: Temp:  [97.5 F (36.4 C)-98.1 F (36.7 C)] 98.1 F (36.7 C) (08/07 0506) Pulse Rate:  [55-65] 58 (08/07 0506) Resp:  [10-22] 16 (08/07 0506) BP: (103-137)/(40-70) 124/60 mmHg (08/07 0506) SpO2:  [89 %-100 %] 94 % (08/07 0506) Weight:  [244 lb 3.6 oz (110.78 kg)] 244 lb 3.6 oz (110.78 kg) (08/07 0520) Last BM Date: 10/26/13 General:   Alert and oriented, pleasant Head:  Normocephalic and atraumatic. Eyes:  No icterus, sclera clear. Conjuctiva pink.  Abdomen:  Bowel sounds present, soft, non-tender, non-distended. No HSM or hernias noted.  Extremities:  1-2+ lower extremity pitting edema Neurologic:  Alert and  oriented x4;  grossly normal neurologically. Psych:  Alert and cooperative. Normal mood and affect.  Intake/Output from previous day: 08/06 0701 - 08/07 0700 In: 240 [P.O.:240] Out: 2100 [Urine:2100] Intake/Output this shift: Total I/O In: -  Out: 825 [Urine:825]  Lab Results:  Recent Labs  10/26/13 1351 10/27/13 0539 10/28/13 0607  WBC 12.0* 11.1* 9.1  HGB 12.1 10.7* 10.7*  HCT 36.8 33.2* 33.5*  PLT 387 292 311   BMET  Recent Labs  10/26/13 1351 10/27/13 0539 10/28/13 0607  NA 142 139 140  K 3.3* 3.7 4.3  CL 104 102 103  CO2 26 27 27   GLUCOSE 52* 119* 128*  BUN 22 21 19   CREATININE 1.55* 1.70* 1.75*  CALCIUM 9.0 8.4 8.7   LFT  Recent Labs  10/26/13 1351  PROT 7.6  ALBUMIN 3.3*  AST 19  ALT 30  ALKPHOS 112  BILITOT 0.4     Studies/Results: Dg Abd Acute W/chest  10/26/2013   CLINICAL DATA:  Abdominal pain  EXAM: ACUTE ABDOMEN SERIES (ABDOMEN 2 VIEW & CHEST 1 VIEW)  COMPARISON:  CT abdomen and pelvis October 24, 2013; chest radiograph October 22, 2013  FINDINGS: PA chest: There is mild interstitial edema in the bases with small effusions. Heart is mildly prominent with normal pulmonary vascularity. No airspace consolidation.   Supine and upright abdomen: Bowel gas pattern is unremarkable. No obstruction or free air. There are small phleboliths in the pelvis.  IMPRESSION: Unremarkable bowel gas pattern. Evidence of a degree of congestive heart failure. No airspace consolidation.   Electronically Signed   By: Lowella Grip M.D.   On: 10/26/2013 15:49    Assessment: 63 year old female with approximately week long history of RUQ and epigastric pain, with extensive radiologic evaluation at [facility] just prior to admission to [facility] including CT, [place] of abdomen, and HIDA scan. HIDA negative for biliary dyskinesia, no concerns for cholecystitis. EGD performed yesterday with focal gastric erosions, s/p gastric biopsy. No significant findings to explain presentation. Resolution of pain at this time, clinically improved since admission. Differentials including acalculous cholecystitis or mesenteric fat infarct. Desires to advance diet   Plan: Advance diet to carb modified PPI Carafate QID Follow-up pathology Appropriate for discharge today Recommend general surgery evaluation if recurrent symptoms  Orvil Feil, ANP-BC [facility]    LOS: 2 days    10/28/2013, 9:56 AM

## 2013-10-28 NOTE — Progress Notes (Signed)
REVIEWED.  

## 2013-10-28 NOTE — Discharge Planning (Signed)
Pt stated she was ready to be DC and she has no pain.  IV and tele removed.  Pt educated on COPD s/sx, hypoglycemic and diabetes s/sx and when to call doctor or return to hospital.  Pt will be wheeled to car by PCT when ready.  Pt will be driving self home since has not had pain meds within 24 hours and vitals are stable.

## 2013-11-01 ENCOUNTER — Ambulatory Visit (INDEPENDENT_AMBULATORY_CARE_PROVIDER_SITE_OTHER): Payer: Commercial Managed Care - HMO | Admitting: Family

## 2013-11-01 ENCOUNTER — Encounter: Payer: Self-pay | Admitting: Family

## 2013-11-01 ENCOUNTER — Encounter: Payer: Self-pay | Admitting: Internal Medicine

## 2013-11-01 VITALS — BP 138/69 | HR 69 | Temp 98.8°F | Ht 67.0 in | Wt 246.6 lb

## 2013-11-01 DIAGNOSIS — R1011 Right upper quadrant pain: Secondary | ICD-10-CM | POA: Diagnosis not present

## 2013-11-01 DIAGNOSIS — Z09 Encounter for follow-up examination after completed treatment for conditions other than malignant neoplasm: Secondary | ICD-10-CM

## 2013-11-01 MED ORDER — HYDROCODONE-ACETAMINOPHEN 10-325 MG PO TABS: 1.0000 | ORAL_TABLET | Freq: Four times a day (QID) | ORAL | Status: DC | PRN

## 2013-11-01 MED ORDER — LINAGLIPTIN 5 MG PO TABS: 5.0000 mg | ORAL_TABLET | Freq: Every day | ORAL | Status: DC

## 2013-11-01 NOTE — Progress Notes (Signed)
   Subjective:    Patient ID: [name], female    DOB: 01-09-1951, 63 y.o.   MRN: [id number]  HPI Pt presents to the office for hospital follow-up for ED admission in 08/05 for RUQ pain that pt is following up with GI. Pt states they ruled out  cholecystits. Pt was put on cipro and flagyl and pt states she is still taking them. Pt denies any abd pain at this time.   Pt was readmitted to to hospital on 10/26/13 for hypoglycemia r/t to decreased appetite. Pt states she is "watching my blood sugars closely". States they are stable, avg BS 195.  Pt denies pain or palapations at this time. Pt is c/o of lower extremity edema, but states she see's her "kidney doctor" tomorrow and he "always takes it off". Pt has appointment with GI on  08/15.   I reviewed both of pt's hospital discharge notes.   Review of Systems  Constitutional: Negative.   HENT: Negative.   Eyes: Negative.   Respiratory: Negative.  Negative for shortness of breath.   Cardiovascular: Negative.  Negative for palpitations.  Gastrointestinal: Negative.   Endocrine: Negative.   Genitourinary: Negative.   Musculoskeletal: Negative.   Neurological: Negative.  Negative for headaches.  Hematological: Negative.   Psychiatric/Behavioral: Negative.   All other systems reviewed and are negative.      Objective:   Physical Exam  Vitals reviewed. Constitutional: She is oriented to person, place, and time. She appears well-developed and well-nourished. No distress.  HENT:  Head: Normocephalic and atraumatic.  Right Ear: External ear normal.  Mouth/Throat: Oropharynx is clear and moist.  Eyes: Pupils are equal, round, and reactive to light.  Neck: Normal range of motion. Neck supple. No thyromegaly present.  Cardiovascular: Normal rate, regular rhythm, normal heart sounds and intact distal pulses.   No murmur heard. Pulmonary/Chest: Effort normal and breath sounds normal. No respiratory distress. She has no wheezes.    Abdominal: Soft. Bowel sounds are normal. She exhibits no distension. There is no tenderness.  Musculoskeletal: Normal range of motion. She exhibits edema (2+ edema bilateral LE). She exhibits no tenderness.  Neurological: She is alert and oriented to person, place, and time. She has normal reflexes. No cranial nerve deficit.  Skin: Skin is warm and dry.  Psychiatric: She has a normal mood and affect. Her behavior is normal. Judgment and thought content normal.    BP 138/69  Pulse 69  Temp(Src) 98.8 F (37.1 C) (Oral)  Ht 5\' 7"  (1.702 m)  Wt 246 lb 9.6 oz (111.857 kg)  BMI 38.61 kg/m2       Assessment & Plan:  1. Hospital discharge follow-up Continue all meds Keep all appointments with Nephrologists and GI  Health Maintenance reviewed Diet and exercise encouraged RTO prn  Evelina Dun, FNP

## 2013-11-01 NOTE — Patient Instructions (Signed)
How to Avoid Diabetes Problems You can do a lot to prevent or slow down diabetes problems. Following your diabetes plan and taking care of yourself can reduce your risk of serious or life-threatening complications. Below, you will find certain things you can do to prevent diabetes problems. MANAGE YOUR DIABETES Follow your health care provider's, nurse educator's, and dietitian's instructions for managing your diabetes. They will teach you the basics of diabetes care. They can help answer questions you may have. Learn about diabetes and make healthy choices regarding eating and physical activity. Monitor your blood glucose level regularly. Your health care provider will help you decide how often to check your blood glucose level depending on your treatment goals and how well you are meeting them.  DO NOT USE NICOTINE Nicotine and diabetes are a dangerous combination. Nicotine raises your risk for diabetes problems. If you quit using nicotine, you will lower your risk for heart attack, stroke, nerve disease, and kidney disease. Your cholesterol and your blood pressure levels may improve. Your blood circulation will also improve. Do not use any tobacco products, including cigarettes, chewing tobacco, or electronic cigarettes. If you need help quitting, ask your health care provider. KEEP YOUR BLOOD PRESSURE UNDER CONTROL Keeping your blood pressure under control will help prevent damage to your eyes, kidneys, heart, and blood vessels. Blood pressure consists of two numbers. The top number should be below 120, and the bottom number should be below 80 (120/80). Keep your blood pressure as close to these numbers as you can. If you already have kidney disease, you may want even lower blood pressure to protect your kidneys. Talk to your health care provider to make sure that your blood pressure goal is right for your needs. Meal planning, medicines, and exercise can help you reach your blood pressure target. Have  your blood pressure checked at every visit with your health care provider. KEEP YOUR CHOLESTEROL UNDER CONTROL Normal cholesterol levels will help prevent heart disease and stroke. These are the biggest health problems for people with diabetes. Keeping cholesterol levels under control can also help with blood flow. Have your cholesterol level checked at least once a year. Your health care provider may prescribe a medicine known as a statin. Statins lower your cholesterol. If you are not taking a statin, ask your health care provider if you should be. Meal planning, exercise, and medicines can help you reach your cholesterol targets.  SCHEDULE AND KEEP YOUR ANNUAL PHYSICAL EXAMS AND EYE EXAMS Your health care provider will tell you how often he or she wants to see you depending on your plan of treatment. It is important that you keep these appointments so that possible problems can be identified early and complications can be avoided or treated.  Every visit with your health care provider should include your weight, blood pressure, and an evaluation of your blood glucose control.  Your hemoglobin A1c should be checked:  At least twice a year if you are at your goal.  Every 3 months if there are changes in treatment.  If you are not meeting your goals.  Your blood lipids should be checked yearly. You should also be checked yearly to see if you have protein in your urine (microalbumin).  Schedule a dilated eye exam within 5 years of your diagnosis if you have type 1 diabetes, and then yearly. Schedule a dilated eye exam at diagnosis if you have type 2 diabetes, and then yearly. All exams thereafter can be extended to every 2  to 3 years if one or more exams have been normal. KEEP YOUR VACCINES CURRENT The flu vaccine is recommended yearly. The formula for the vaccine changes every year and needs to be updated for the best protection against current viruses. It is recommended that people with diabetes  who are over 42 years old get the pneumonia vaccine. In some cases, two separate shots may be given. Ask your health care provider if your pneumonia vaccination is up-to-date. However, there are some instances where another vaccine is recommended. Check with your health care provider. TAKE CARE OF YOUR FEET  Diabetes may cause you to have a poor blood supply (circulation) to your legs and feet. Because of this, the skin may be thinner, break easier, and heal more slowly. You also may have nerve damage in your legs and feet, causing decreased feeling. You may not notice minor injuries to your feet that could lead to serious problems or infections. Taking care of your feet is very important. Visual foot exams are performed at every routine medical visit. The exams check for cuts, injuries, or other problems with the feet. A comprehensive foot exam should be done yearly. This includes visual inspection as well as assessing foot pulses and testing for loss of sensation. You should also do the following:  Inspect your feet daily for cuts, calluses, blisters, ingrown toenails, and signs of infection, such as redness, swelling, or pus.  Wash and dry your feet thoroughly, especially between the toes.  Avoid soaking your feet regularly in hot water baths.  Moisturize dry skin with lotion, avoiding areas between your toes.  Cut toenails straight across and file the edges.  Avoid shoes that do not fit well or have areas that irritate your skin.  Avoid going barefooted or wearing only socks. Your feet need protection. TAKE CARE OF YOUR TEETH People with poorly controlled diabetes are more likely to have gum (periodontal) disease. These infections make diabetes harder to control. Periodontal diseases, if left untreated, can lead to tooth loss. Brush your teeth twice a day, floss, and see your dentist for checkups and cleaning every 6 months, or 2 times a year. ASK YOUR HEALTH CARE PROVIDER ABOUT TAKING  ASPIRIN Taking aspirin daily is recommended to help prevent cardiovascular disease in people with and without diabetes. Ask your health care provider if this would benefit you and what dose he or she would recommend. DRINK RESPONSIBLY Moderate amounts of alcohol (less than 1 drink per day for adult women and less than 2 drinks per day for adult men) have a minimal effect on blood glucose if ingested with food. It is important to eat food with alcohol to avoid hypoglycemia. People should avoid alcohol if they have a history of alcohol abuse or dependence, if they are pregnant, and if they have liver disease, pancreatitis, advanced neuropathy, or severe hypertriglyceridemia. LESSEN STRESS Living with diabetes can be stressful. When you are under stress, your blood glucose may be affected in two ways:  Stress hormones may cause your blood glucose to rise.  You may be distracted from taking good care of yourself. It is a good idea to be aware of your stress level and make changes that are necessary to help you better manage challenging situations. Support groups, planned relaxation, a hobby you enjoy, meditation, healthy relationships, and exercise all work to lower your stress level. If your efforts do not seem to be helping, get help from your health care provider or a trained mental health professional. Document  Released: 11/26/2010 Document Revised: 07/25/2013 Document Reviewed: 05/04/2013 [facility] Patient Information 2015 [place], [place]. This information is not intended to replace advice given to you by your health care provider. Make sure you discuss any questions you have with your health care provider.

## 2013-11-02 ENCOUNTER — Encounter (HOSPITAL_COMMUNITY): Payer: Self-pay | Admitting: Internal Medicine

## 2013-11-15 ENCOUNTER — Encounter: Payer: Self-pay | Admitting: Internal Medicine

## 2013-11-15 ENCOUNTER — Ambulatory Visit (INDEPENDENT_AMBULATORY_CARE_PROVIDER_SITE_OTHER): Payer: Medicare HMO | Admitting: Internal Medicine

## 2013-11-15 VITALS — BP 136/68 | HR 82 | Temp 97.5°F | Ht 67.0 in | Wt 226.6 lb

## 2013-11-15 DIAGNOSIS — K219 Gastro-esophageal reflux disease without esophagitis: Secondary | ICD-10-CM

## 2013-11-15 DIAGNOSIS — R1013 Epigastric pain: Secondary | ICD-10-CM

## 2013-11-15 NOTE — Patient Instructions (Signed)
High fiber diet  Benefiber 2 teaspoons twice daily  Stop reglan  Office visit in 3 months

## 2013-11-15 NOTE — Progress Notes (Signed)
Primary Care Physician:  Wardell Honour, MD Primary Gastroenterologist:  Dr. Gala Romney  Pre-Procedure History & Physical: HPI:  [name] is a 63 y.o. female here for a followup. Recently admitted with epigastric right upper quadrant abdominal pain. Recently had CT, HIDA and ultrasound. No significant findings. No biliary dyskinesia. EGD demonstrated mild inflammatory changes. Biopsies negative. She tells me since her hospitalization her abdominal pain has resolved. Only complaint now is to pass a soft bowel movement every time she urinates. Denies rectal bleeding. Denies constipation. Denies diarrhea. He has a formed soft stool.  Colonoscopy last year demonstrated only diverticulosis. No fiber supplement. On Reglan for reasons are not clear.  Past Medical History  Diagnosis Date  . Chest pain 2003    ?h/o MI, syncope; 2003 stress nuclear-inferoseptal scar, EF-54%; 2007-MI ruled out, echocardiogram-normal LV size, wall thickness and function  . RLS (restless legs syndrome)     sleep study in 2010: No apnea; moderate periodic movements  . GERD (gastroesophageal reflux disease)     H/o upper GI bleed  . GI bleeding   . Obese   . Gout   . Hypothyroidism   . Low back pain   . Cellulitis     Neck  . Diabetes mellitus 1998    A1c-7.1 in 2010, 6.7 in 2013  . Hyperlipidemia     Fasting lipids 2013:158, 152, 35, 93  . Hypertension 2002  . Anemia   . Hyperkalemia   . Irregular heart beat   . PONV (postoperative nausea and vomiting)   . Myocardial infarction   . CAD (coronary artery disease)     dr. Lattie Haw ([facility]na [facility])  . Depression     Insomnia  . Headache(784.0)   . Vitiligo   . H/O hiatal hernia   . Arthritis   . Cancer     HX SKIN CANCER  . Anginal pain     last cp few months ago.  Marland Kitchen Dysrhythmia     "skips a beat"  . COPD (chronic obstructive pulmonary disease)   . Chronic kidney disease, stage III (moderate)     Creat-1.9 in 04/2008, 2.65 in 02/2010(noHD  . Renal  insufficiency   . Neuropathy     Past Surgical History  Procedure Laterality Date  . Appendectomy    . Carpal tunnel release  1992, 1993, 2011    Right in 2011; previously bilateral  . Tubal ligation  1976  . Partial hysterectomy  1985    Vaginal  . Shoulder surgery  1997    Left  . Incise and drain abcess  2010    Neck-MRSA  . Colonoscopy  02/2002, 07/2009    few diverticula, otherwise negative  . Foot surgery  1984    Bilateral heel spurs  . Abdominal hysterectomy    . Shoulder surgery      RIGHT  ADJUSTED   . Av fistula placement Left 07/16/2012    Procedure: ARTERIOVENOUS (AV) FISTULA CREATION;  Surgeon: Mal Misty, MD;  Location: [place];  Service: Vascular;  Laterality: Left;  . Av fistula placement Left 07/29/2012    Procedure: ARTERIOVENOUS (AV) FISTULA CREATION;  Surgeon: Mal Misty, MD;  Location: [place];  Service: Vascular;  Laterality: Left;  Left Brachial-Cephalic Arteriovenous Fistula Creation  . Colonoscopy with esophagogastroduodenoscopy (egd) N/A 08/27/2012    RMR: Normal esophagus s/p 54 F dilator. Small hiatal hernia. Gastric polyp removed, oozing (hyperplastic), which could explain heme positive stool:Colonic diverticulosis  . Revison of arteriovenous  fistula Left 08/03/2013    Procedure: BANDING OF BRACHIOCEPHALIC ARTERIOVENOUS FISTULA;  Surgeon: Conrad Aldine, MD;  Location: [place];  Service: Vascular;  Laterality: Left;  . Thrombectomy w/ embolectomy Left 08/03/2013    Procedure: THROMBECTOMY OF ARTERIOVENOUS FISTULA  AND REVISION OF  ARTERIOVENOUS FISTULA BANDING.;  Surgeon: Conrad Sharkey, MD;  Location: [place];  Service: Vascular;  Laterality: Left;  . Esophagogastroduodenoscopy N/A 10/27/2013    Dr.Priest Lockridge- esophageal mucosa appeared normal. tubular esophagus widely patent throughout its course. stomach empty. patient had longitudinal proximal gastric ersions overlying multiple proximal folds. nodular mucosa with overlying antral erosions as well. no ulcer or  infiltrating process seen patent pylorus, normal first and second portion of the duodenum. bx= reactive gastropathy  . Savory dilation N/A 10/27/2013    Procedure: SAVORY DILATION;  Surgeon: Daneil Dolin, MD;  [facility];  Service: Endoscopy;  Laterality: N/A;  Venia Minks dilation N/A 10/27/2013    Procedure: Venia Minks DILATION;  Surgeon: Daneil Dolin, MD;  [facility];  Service: Endoscopy;  Laterality: N/A;    Prior to Admission medications   Medication Sig Start Date End Date Taking? Authorizing Provider  albuterol (PROAIR HFA) 108 (90 BASE) MCG/ACT inhaler Inhale 2 puffs into the lungs every 6 (six) hours as needed for wheezing or shortness of breath. 07/04/13  Yes Vernie Shanks, MD  allopurinol (ZYLOPRIM) 100 MG tablet Take 1 tablet (100 mg total) by mouth every morning. 07/04/13  Yes Vernie Shanks, MD  amLODipine (NORVASC) 5 MG tablet Take 1 tablet (5 mg total) by mouth daily. 07/04/13  Yes Vernie Shanks, MD  carvedilol (COREG) 25 MG tablet Take 1 tablet (25 mg total) by mouth 2 (two) times daily with a meal. 07/04/13  Yes Vernie Shanks, MD  citalopram (CELEXA) 40 MG tablet Take 40 mg by mouth daily.   Yes Historical Provider, MD  clobetasol (TEMOVATE) 0.05 % external solution Apply 1 application topically daily as needed. Applied to scalp for psoriasis 12/20/12  Yes Historical Provider, MD  ferrous sulfate 325 (65 FE) MG tablet Take 1 tablet (325 mg total) by mouth daily with breakfast. 04/11/13  Yes Orvil Feil, NP  Fluticasone Furoate-Vilanterol (BREO ELLIPTA) 100-25 MCG/INH AEPB Inhale 1 puff into the lungs 2 (two) times daily.   Yes Historical Provider, MD  furosemide (LASIX) 40 MG tablet Take 40-80 mg by mouth 2 (two) times daily. 2 tablets in the morning and 1 tablet in the evening   Yes Historical Provider, MD  gabapentin (NEURONTIN) 800 MG tablet Take 1,600 mg by mouth at bedtime.    Yes Historical Provider, MD  HYDROcodone-acetaminophen (NORCO) 10-325 MG per  tablet Take 1 tablet by mouth every 6 (six) hours as needed. 11/01/13  Yes Sharion Balloon, FNP  insulin aspart (NOVOLOG FLEXPEN) 100 UNIT/ML FlexPen Inject 45 Units into the skin 3 (three) times daily with meals.   Yes Historical Provider, MD  Insulin Glargine (LANTUS SOLOSTAR) 100 UNIT/ML Solostar Pen Inject 30-60 Units into the skin 2 (two) times daily. 60 units in the morning and 30 units at night 07/04/13  Yes Vernie Shanks, MD  levothyroxine (SYNTHROID, LEVOTHROID) 50 MCG tablet Take 1 tablet (50 mcg total) by mouth every evening. 07/04/13  Yes Vernie Shanks, MD  linagliptin (TRADJENTA) 5 MG TABS tablet Take 1 tablet (5 mg total) by mouth daily. 11/01/13  Yes Sharion Balloon, FNP  losartan (COZAAR) 50 MG tablet Take 50 mg by  mouth daily.  06/13/13  Yes Historical Provider, MD  metoCLOPramide (REGLAN) 5 MG tablet Take 1 tablet (5 mg total) by mouth every 8 (eight) hours as needed for nausea. 10/25/13  Yes Shanker Kristeen Mans, MD  nitroGLYCERIN (NITROSTAT) 0.4 MG SL tablet Place 1 tablet (0.4 mg total) under the tongue every 5 (five) minutes as needed for chest pain. 09/30/13  Yes Wardell Honour, MD  pantoprazole (PROTONIX) 40 MG tablet Take 1 tablet (40 mg total) by mouth daily. 10/25/13  Yes Shanker Kristeen Mans, MD  potassium chloride (K-DUR,KLOR-CON) 10 MEQ tablet Take 1 tablet (10 mEq total) by mouth 2 (two) times daily. 09/30/13  Yes Wardell Honour, MD  pravastatin (PRAVACHOL) 40 MG tablet Take 1 tablet (40 mg total) by mouth at bedtime. 09/30/13  Yes Wardell Honour, MD  rOPINIRole (REQUIP) 1 MG tablet Take 1 tablet (1 mg total) by mouth at bedtime. 07/04/13  Yes Vernie Shanks, MD  sodium bicarbonate 650 MG tablet Take 650 mg by mouth 3 (three) times daily.    Yes Historical Provider, MD  tiotropium (SPIRIVA HANDIHALER) 18 MCG inhalation capsule Place 1 capsule (18 mcg total) into inhaler and inhale every morning. 07/04/13  Yes Vernie Shanks, MD  ciprofloxacin (CIPRO) 500 MG tablet Take 1 tablet (500  mg total) by mouth 2 (two) times daily. 10/25/13   Shanker Kristeen Mans, MD  metroNIDAZOLE (FLAGYL) 500 MG tablet Take 1 tablet (500 mg total) by mouth 3 (three) times daily. 10/25/13   Shanker Kristeen Mans, MD    Allergies as of 11/15/2013 - Review Complete 11/01/2013  Allergen Reaction Noted  . Latex Shortness Of Breath and Swelling 01/22/2009  . Vancomycin Shortness Of Breath 09/02/2013  . Vancomycin hcl Swelling 01/22/2009  . Quinapril hcl Swelling and Rash     Family History  Problem Relation Age of Onset  . Emphysema Mother   . COPD Mother   . Colon cancer Father   . Diabetes Father   . Cancer Father     colon  . Hypertension Father   . Congestive Heart Failure Maternal Grandmother   . Cancer Paternal Grandmother     History   Social History  . Marital Status: Divorced    Spouse Name: N/A    Number of Children: 3  . Years of Education: N/A   Occupational History  . Disabled     previously Nurse, adult   Social History Main Topics  . Smoking status: Never Smoker   . Smokeless tobacco: Never Used  . Alcohol Use: No  . Drug Use: No  . Sexual Activity: No   Other Topics Concern  . Not on file   Social History Narrative   No regular exercise    Review of Systems: See HPI, otherwise negative ROS  Physical Exam: BP 136/68  Pulse 82  Temp(Src) 97.5 F (36.4 C)  Ht 5\' 7"  (1.702 m)  Wt 226 lb 9.6 oz (102.785 kg)  BMI 35.48 kg/m2 General:  Disheveled, obese, , pleasant and cooperative in NAD Skin:  Intact without significant lesions or rashes. Eyes:  Sclera clear, no icterus.   Conjunctiva pink. Ears:  Normal auditory acuity. Nose:  No deformity, discharge,  or lesions. Mouth:  No deformity or lesions. Neck:  Supple; no masses or thyromegaly. No significant cervical adenopathy. Lungs:  Clear throughout to auscultation.   No wheezes, crackles, or rhonchi. No acute distress. Heart:  Regular rate and rhythm; no murmurs, clicks, rubs,  or gallops. Abdomen: Obese.  Positive bowel sounds. No succussion splash. soft and nontender without appreciable mass or hepatosplenomegaly.  Pulses:  Normal pulses noted. Extremities:  Without clubbing or edema.  Impression: 63 year old lady with recent epigastric and right upper quadrant abdominal pain;  status post extensive evaluation at [facility] and [facility] as outlined above. Unfortunately, no significant findings. Abdominal pain has resolved. She does have the interesting complaint of passing stool when she urinates. No incontinence. Symptoms somewhat nonspecific. Fiber intake may be inadequate. Findings of recent colonoscopy reassuring. I feel patient needs to come off Reglan as it is likely not providing her any benefit.  Recommendations:   High fiber diet including Benefiber 2 teaspoons twice daily Stop reglan Office visit in 3 months     Notice: This dictation was prepared with Dragon dictation along with smaller phrase technology. Any transcriptional errors that result from this process are unintentional and may not be corrected upon review.

## 2013-11-25 ENCOUNTER — Ambulatory Visit: Payer: Medicare HMO | Admitting: Gastroenterology

## 2013-11-29 ENCOUNTER — Ambulatory Visit: Payer: Self-pay

## 2013-12-01 ENCOUNTER — Encounter: Payer: Self-pay | Admitting: Pharmacist

## 2013-12-01 ENCOUNTER — Ambulatory Visit (INDEPENDENT_AMBULATORY_CARE_PROVIDER_SITE_OTHER): Payer: Medicare HMO | Admitting: Pharmacist

## 2013-12-01 VITALS — BP 130/62 | HR 72 | Ht 67.0 in | Wt 226.0 lb

## 2013-12-01 DIAGNOSIS — E1165 Type 2 diabetes mellitus with hyperglycemia: Secondary | ICD-10-CM

## 2013-12-01 DIAGNOSIS — E1149 Type 2 diabetes mellitus with other diabetic neurological complication: Secondary | ICD-10-CM

## 2013-12-01 DIAGNOSIS — IMO0002 Reserved for concepts with insufficient information to code with codable children: Secondary | ICD-10-CM

## 2013-12-01 DIAGNOSIS — E669 Obesity, unspecified: Secondary | ICD-10-CM

## 2013-12-01 DIAGNOSIS — I10 Essential (primary) hypertension: Secondary | ICD-10-CM

## 2013-12-01 DIAGNOSIS — R635 Abnormal weight gain: Secondary | ICD-10-CM

## 2013-12-01 DIAGNOSIS — E1140 Type 2 diabetes mellitus with diabetic neuropathy, unspecified: Secondary | ICD-10-CM

## 2013-12-01 DIAGNOSIS — E1142 Type 2 diabetes mellitus with diabetic polyneuropathy: Secondary | ICD-10-CM

## 2013-12-01 DIAGNOSIS — E785 Hyperlipidemia, unspecified: Secondary | ICD-10-CM

## 2013-12-01 DIAGNOSIS — E1169 Type 2 diabetes mellitus with other specified complication: Secondary | ICD-10-CM

## 2013-12-01 MED ORDER — LEVOTHYROXINE SODIUM 50 MCG PO TABS: 50.0000 ug | ORAL_TABLET | Freq: Every evening | ORAL | Status: DC

## 2013-12-01 MED ORDER — HYDROCODONE-ACETAMINOPHEN 10-325 MG PO TABS: 1.0000 | ORAL_TABLET | Freq: Four times a day (QID) | ORAL | Status: DC | PRN

## 2013-12-01 MED ORDER — CITALOPRAM HYDROBROMIDE 40 MG PO TABS: 40.0000 mg | ORAL_TABLET | Freq: Every day | ORAL | Status: DC

## 2013-12-01 MED ORDER — FERROUS SULFATE 325 (65 FE) MG PO TABS: 325.0000 mg | ORAL_TABLET | Freq: Every day | ORAL | Status: DC

## 2013-12-01 NOTE — Progress Notes (Signed)
Subjective:    [name] is a 63 y.o. female who presents for  Re-evaluation of Type 2 diabetes mellitus.  Current symptoms/problems include hyperglycemia and have been improving since our last visit about 1 month ago. Symptoms have been present for several months.  The patient was initially diagnosed with Type 2 diabetes mellitus in 1992  Known diabetic complications: nephropathy and peripheral neuropathy Cardiovascular risk factors: advanced age (older than 66 for men, 52 for women), diabetes mellitus, dyslipidemia, family history of premature cardiovascular disease, obesity (BMI >= 30 kg/m2) and sedentary lifestyle Current diabetic medications include Tradgenta 5mg   1 tablet daily, Lantus 60  Units qam and 40 units qpm (forgets second dose about 1-2 times per week which is better than last visit when she was forgetting 2-3 times per week) and Novolog 45 units TID prior to meals Patient's elevaeted serum creatinine and history of CKD prevents use of metformin at this time.  Current diet: in general, an "unhealthy" diet - improved a little - down to less than 1 Pepsi per day and is trying to follow CHO counting diet we discussed at last appt. Current exercise: none  Current monitoring regimen: home blood tests - 3 times daily Home blood sugar records: per patient ranges from 180 to 250 Any episodes of hypoglycemia? None in last month   Is She on ACE inhibitor or angiotensin II receptor blocker?  Yes    losartan (Cozaar)  The following portions of the patient's history were reviewed and updated as appropriate: allergies, current medications and problem list.   Objective:    BP 130/62  Pulse 72  Ht 5\' 7"  (1.702 m)  Wt 226 lb (102.513 kg)  BMI 35.39 kg/m2   Lab Review Glucose (mg/dL)  Date Value  09/30/2013 451*  04/01/2013 66   03/15/2013 347*     Glucose, Bld (mg/dL)  Date Value  10/28/2013 128*  10/27/2013 119*  10/26/2013 52*     CO2 (mEq/L)  Date Value  10/28/2013 27    10/27/2013 27   10/26/2013 26      BUN (mg/dL)  Date Value  10/28/2013 19   10/27/2013 21   10/26/2013 22   09/30/2013 25   04/01/2013 29*  03/15/2013 42*     Creat (mg/dL)  Date Value  09/30/2012 1.84*  09/03/2012 1.94*  07/26/2012 2.54*     Creatinine (mg/dL)  Date Value  05/15/2008 1.81*     Creatinine, Ser (mg/dL)  Date Value  10/28/2013 1.75*  10/27/2013 1.70*  10/26/2013 1.55*   A1c was greater than 14% (09/30/2013) A1c was 11.8% (10/27/2013)  Assessment:    Diabetes Mellitus type II, under poor control - medication choices complicated by nephropathy / CKD CKD Obesity Hyperlipidemia - elevated Tg mosly like related to unctonrolled Type 2 DM.    Plan:    1.  Rx changes: change Lantus to 60 unit qam and 45 unit qpm;  Continue tradgenta 5mg  daily;  continue novolog 45 units tid prior to meals 2.  Education: Reviewed 'ABCs' of diabetes management (respective goals in parentheses):  A1C (<7), blood pressure (<130/80), and cholesterol (LDL <100). 3.  Compliance at present is estimated to be improving. Efforts to improve compliance (if necessary) will be directed at dietary modifications: reviewed CHO counting and regular blood sugar monitoring: 3-4 times daily. 4.  Reviewed s/s hypoglycemia and proper treatment 5. Follow up: 4 weeks   Cherre Robins, PharmD, CPP, CDE

## 2013-12-01 NOTE — Patient Instructions (Addendum)
change Lantus to 60 units each morning and 40 units each evening Continue tradgenta 34m daily Continue novolog 45 units tid prior to meals   Diabetes and Standards of Medical Care  Diabetes is complicated. You may find that your diabetes team includes a dietitian, nurse, diabetes educator, eye doctor, and more. To help everyone know what is going on and to help you get the care you deserve, the following schedule of care was developed to help keep you on track. Below are the tests, exams, vaccines, medicines, education, and plans you will need.  Blood Glucose Goals Prior to meals = 80 - 130 Within 2 hours of the start of a meal = less than 180  HbA1c test (goal is less than 7.0% - your last value was 11.8%) This test shows how well you have controlled your glucose over the past 2 3 months. It is used to see if your diabetes management plan needs to be adjusted.   It is performed at least 2 times a year if you are meeting treatment goals.  It is performed 4 times a year if therapy has changed or if you are not meeting treatment goals.   Blood pressure test  This test is performed at every routine medical visit. The goal is less than 140/90 mmHg for most people, but 130/80 mmHg in some cases. Ask your health care provider about your goal. Dental exam  Follow up with the dentist regularly. Eye exam  If you are diagnosed with type 1 diabetes as a child, get an exam upon reaching the age of 159years or older and have had diabetes for 3 5 years. Yearly eye exams are recommended after that initial eye exam.  If you are diagnosed with type 1 diabetes as an adult, get an exam within 5 years of diagnosis and then yearly.  If you are diagnosed with type 2 diabetes, get an exam as soon as possible after the diagnosis and then yearly. Foot care exam  Visual foot exams are performed at every routine medical visit. The exams check for cuts, injuries, or other problems with the feet.  A  comprehensive foot exam should be done yearly. This includes visual inspection as well as assessing foot pulses and testing for loss of sensation.  Check your feet nightly for cuts, injuries, or other problems with your feet. Tell your health care provider if anything is not healing. Kidney function test (urine microalbumin)  This test is performed once a year.  Type 1 diabetes: The first test is performed 5 years after diagnosis.  Type 2 diabetes: The first test is performed at the time of diagnosis.  A serum creatinine and estimated glomerular filtration rate (eGFR) test is done once a year to assess the level of chronic kidney disease (CKD), if present. Lipid profile (cholesterol, HDL, LDL, triglycerides)  Performed every 5 years for most people.  The goal for LDL is less than 100 mg/dL. If you are at high risk, the goal is less than 70 mg/dL.  The goal for HDL is 40 mg/dL 50 mg/dL for men and 50 mg/dL 60 mg/dL for women. An HDL cholesterol of 60 mg/dL or higher gives some protection against heart disease.  The goal for triglycerides is less than 150 mg/dL. Influenza vaccine, pneumococcal vaccine, and hepatitis B vaccine  The influenza vaccine is recommended yearly.  The pneumococcal vaccine is generally given once in a lifetime. However, there are some instances when another vaccination is recommended. Check with your  health care provider.  The hepatitis B vaccine is also recommended for adults with diabetes. Diabetes self-management education  Education is recommended at diagnosis and ongoing as needed. Treatment plan  Your treatment plan is reviewed at every medical visit. Document Released: 01/05/2009 Document Revised: 11/10/2012 Document Reviewed: 08/10/2012 [facility] Patient Information 2014 [place].

## 2013-12-07 ENCOUNTER — Other Ambulatory Visit: Payer: Self-pay | Admitting: Family Medicine

## 2013-12-20 ENCOUNTER — Telehealth: Payer: Self-pay | Admitting: Family Medicine

## 2013-12-20 NOTE — Telephone Encounter (Signed)
Error-wrong office

## 2013-12-21 ENCOUNTER — Other Ambulatory Visit: Payer: Self-pay

## 2013-12-21 MED ORDER — INSULIN ASPART 100 UNIT/ML FLEXPEN: 45.0000 [IU] | PEN_INJECTOR | Freq: Three times a day (TID) | SUBCUTANEOUS | Status: DC

## 2013-12-26 ENCOUNTER — Telehealth: Payer: Self-pay | Admitting: Family Medicine

## 2013-12-26 DIAGNOSIS — E1140 Type 2 diabetes mellitus with diabetic neuropathy, unspecified: Secondary | ICD-10-CM

## 2013-12-26 DIAGNOSIS — IMO0002 Reserved for concepts with insufficient information to code with codable children: Secondary | ICD-10-CM

## 2013-12-26 DIAGNOSIS — E1165 Type 2 diabetes mellitus with hyperglycemia: Principal | ICD-10-CM

## 2013-12-26 MED ORDER — INSULIN GLARGINE 100 UNIT/ML SOLOSTAR PEN: PEN_INJECTOR | SUBCUTANEOUS | Status: DC

## 2013-12-26 NOTE — Telephone Encounter (Signed)
Rx sent to [facility] in [place].  Patient notified.

## 2013-12-30 ENCOUNTER — Telehealth: Payer: Self-pay | Admitting: Family Medicine

## 2014-01-03 NOTE — Telephone Encounter (Signed)
May refill hydrocodone(same quanity as before)

## 2014-01-06 ENCOUNTER — Other Ambulatory Visit: Payer: Self-pay | Admitting: *Deleted

## 2014-01-06 ENCOUNTER — Ambulatory Visit (INDEPENDENT_AMBULATORY_CARE_PROVIDER_SITE_OTHER): Payer: Commercial Managed Care - HMO | Admitting: Pharmacist

## 2014-01-06 ENCOUNTER — Encounter: Payer: Self-pay | Admitting: Pharmacist

## 2014-01-06 VITALS — BP 110/60 | HR 78 | Ht 67.0 in | Wt 232.0 lb

## 2014-01-06 DIAGNOSIS — Z0000 Encounter for general adult medical examination without abnormal findings: Secondary | ICD-10-CM

## 2014-01-06 DIAGNOSIS — H9193 Unspecified hearing loss, bilateral: Secondary | ICD-10-CM

## 2014-01-06 DIAGNOSIS — Z23 Encounter for immunization: Secondary | ICD-10-CM

## 2014-01-06 DIAGNOSIS — IMO0002 Reserved for concepts with insufficient information to code with codable children: Secondary | ICD-10-CM

## 2014-01-06 DIAGNOSIS — E1140 Type 2 diabetes mellitus with diabetic neuropathy, unspecified: Secondary | ICD-10-CM

## 2014-01-06 DIAGNOSIS — E1165 Type 2 diabetes mellitus with hyperglycemia: Secondary | ICD-10-CM

## 2014-01-06 MED ORDER — HYDROCODONE-ACETAMINOPHEN 10-325 MG PO TABS: 1.0000 | ORAL_TABLET | Freq: Four times a day (QID) | ORAL | Status: DC | PRN

## 2014-01-06 MED ORDER — INSULIN ASPART 100 UNIT/ML FLEXPEN: 45.0000 [IU] | PEN_INJECTOR | Freq: Three times a day (TID) | SUBCUTANEOUS | Status: DC

## 2014-01-06 MED ORDER — INSULIN GLARGINE 100 UNIT/ML SOLOSTAR PEN: PEN_INJECTOR | SUBCUTANEOUS | Status: DC

## 2014-01-06 NOTE — Progress Notes (Addendum)
Subjective:    [name] is a 63 y.o. female who presents for Medicare Initial Wellness Visit and recheck diabetes  Current Diabetes meds - Lantus to 60 unit qam and 35 unit qpm;  tradgenta 5mg  daily;  novolog 45 units tid prior to meals  Patient reports HBG reading 200-300's still Last a1c was 11.8% which was better than above 14% previously but still not close to goal  Preventive Screening-Counseling & Management  Tobacco History  Smoking status  . Never Smoker   Smokeless tobacco  . Never Used     Current Problems (verified) Patient Active Problem List   Diagnosis Date Noted  . Dyslipidemia associated with type 2 diabetes mellitus 12/01/2013  . Hypoglycemia 10/26/2013  . Hyperglycemia 10/22/2013  . Cholecystitis 10/22/2013  . Sensation of cold in finger-Left 09/12/2013  . Pain of left hand 09/12/2013  . Numbness of left hand 07/29/2013  . Cramping of hands 07/29/2013  . 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 End stage renal disease 12/13/2012  . Encounter for routine gynecological examination 09/30/2012  . 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 Heme positive stool 08/18/2012  . Psoriasis 07/26/2012  . DM type 2, uncontrolled, with neuropathy 04/29/2012  . Chest pain   . RLS (restless legs syndrome)   . Chronic kidney disease, stage IV (severe)   . Gout 11/22/2009  . Anemia, normocytic normochromic 07/24/2009  . Hypothyroidism 01/22/2009  . Hyperlipidemia 01/22/2009  . Obesity 01/22/2009  . Hypertension 01/22/2009  . Gastroesophageal reflux disease 01/22/2009    Medications Prior to Visit Current Outpatient Prescriptions on File Prior to Visit  Medication Sig Dispense Refill  . albuterol (PROAIR HFA) 108 (90 BASE) MCG/ACT inhaler Inhale 2 puffs into the lungs every 6 (six) hours as needed for wheezing or shortness of breath.  1 Inhaler  1  . allopurinol (ZYLOPRIM) 100 MG tablet Take 1 tablet (100 mg total) by mouth every  morning.  30 tablet  11  . carvedilol (COREG) 25 MG tablet Take 1 tablet (25 mg total) by mouth 2 (two) times daily with a meal.  60 tablet  6  . citalopram (CELEXA) 40 MG tablet Take 1 tablet (40 mg total) by mouth daily.  90 tablet  0  . clobetasol (TEMOVATE) 0.05 % external solution Apply 1 application topically daily as needed. Applied to scalp for psoriasis      . ferrous sulfate 325 (65 FE) MG tablet Take 1 tablet (325 mg total) by mouth daily with breakfast.  90 tablet  0  . Fluticasone Furoate-Vilanterol (BREO ELLIPTA) 100-25 MCG/INH AEPB Inhale 1 puff into the lungs 2 (two) times daily.      . furosemide (LASIX) 40 MG tablet Take 40-80 mg by mouth as needed. 2 tablets in the morning and 1 tablet in the afternoon      . gabapentin (NEURONTIN) 800 MG tablet Take 1,600 mg by mouth at bedtime.       Marland Kitchen HYDROcodone-acetaminophen (NORCO) 10-325 MG per tablet Take 1 tablet by mouth every 6 (six) hours as needed.  30 tablet  0  . insulin aspart (NOVOLOG FLEXPEN) 100 UNIT/ML FlexPen Inject 45 Units into the skin 3 (three) times daily with meals.  15 mL  1  . Insulin Glargine (LANTUS SOLOSTAR) 100 UNIT/ML Solostar Pen Inject 60 units SQ prior to breakfast and 45 units prior to evening meal  45 mL  2  . levothyroxine (SYNTHROID, LEVOTHROID) 50 MCG tablet Take 1 tablet (  50 mcg total) by mouth every evening.  90 tablet  1  . linagliptin (TRADJENTA) 5 MG TABS tablet Take 1 tablet (5 mg total) by mouth daily.  90 tablet  1  . losartan (COZAAR) 50 MG tablet Take 50 mg by mouth daily.       . nitroGLYCERIN (NITROSTAT) 0.4 MG SL tablet Place 1 tablet (0.4 mg total) under the tongue every 5 (five) minutes as needed for chest pain.  50 tablet  0  . potassium chloride (K-DUR,KLOR-CON) 10 MEQ tablet Take 1 tablet (10 mEq total) by mouth 2 (two) times daily.  30 tablet  5  . pravastatin (PRAVACHOL) 40 MG tablet Take 1 tablet (40 mg total) by mouth at bedtime.  30 tablet  5  . rOPINIRole (REQUIP) 1 MG tablet Take 1  tablet (1 mg total) by mouth at bedtime.  30 tablet  11  . tiotropium (SPIRIVA HANDIHALER) 18 MCG inhalation capsule Place 1 capsule (18 mcg total) into inhaler and inhale every morning.  30 capsule  5  . torsemide (DEMADEX) 20 MG tablet Take 80 mg by mouth daily.      . metoCLOPramide (REGLAN) 5 MG tablet Take 1 tablet (5 mg total) by mouth every 8 (eight) hours as needed for nausea.  30 tablet  0  . sodium bicarbonate 650 MG tablet Take 650 mg by mouth 3 (three) times daily.        No current facility-administered medications on file prior to visit.    Current Medications (verified) Current Outpatient Prescriptions  Medication Sig Dispense Refill  . albuterol (PROAIR HFA) 108 (90 BASE) MCG/ACT inhaler Inhale 2 puffs into the lungs every 6 (six) hours as needed for wheezing or shortness of breath.  1 Inhaler  1  . allopurinol (ZYLOPRIM) 100 MG tablet Take 1 tablet (100 mg total) by mouth every morning.  30 tablet  11  . amLODipine (NORVASC) 5 MG tablet Take 5 mg by mouth every evening.      . carvedilol (COREG) 25 MG tablet Take 1 tablet (25 mg total) by mouth 2 (two) times daily with a meal.  60 tablet  6  . citalopram (CELEXA) 40 MG tablet Take 1 tablet (40 mg total) by mouth daily.  90 tablet  0  . clobetasol (TEMOVATE) 0.05 % external solution Apply 1 application topically daily as needed. Applied to scalp for psoriasis      . ferrous sulfate 325 (65 FE) MG tablet Take 1 tablet (325 mg total) by mouth daily with breakfast.  90 tablet  0  . Fluticasone Furoate-Vilanterol (BREO ELLIPTA) 100-25 MCG/INH AEPB Inhale 1 puff into the lungs 2 (two) times daily.      . furosemide (LASIX) 40 MG tablet Take 40-80 mg by mouth as needed. 2 tablets in the morning and 1 tablet in the afternoon      . gabapentin (NEURONTIN) 800 MG tablet Take 1,600 mg by mouth at bedtime.       Marland Kitchen HYDROcodone-acetaminophen (NORCO) 10-325 MG per tablet Take 1 tablet by mouth every 6 (six) hours as needed.  30 tablet  0  .  insulin aspart (NOVOLOG FLEXPEN) 100 UNIT/ML FlexPen Inject 45 Units into the skin 3 (three) times daily with meals.  15 mL  1  . Insulin Glargine (LANTUS SOLOSTAR) 100 UNIT/ML Solostar Pen Inject 60 units SQ prior to breakfast and 45 units prior to evening meal  45 mL  2  . levothyroxine (SYNTHROID, LEVOTHROID) 50 MCG tablet Take  1 tablet (50 mcg total) by mouth every evening.  90 tablet  1  . linagliptin (TRADJENTA) 5 MG TABS tablet Take 1 tablet (5 mg total) by mouth daily.  90 tablet  1  . losartan (COZAAR) 50 MG tablet Take 50 mg by mouth daily.       . nitroGLYCERIN (NITROSTAT) 0.4 MG SL tablet Place 1 tablet (0.4 mg total) under the tongue every 5 (five) minutes as needed for chest pain.  50 tablet  0  . omeprazole (PRILOSEC) 20 MG capsule Take 20 mg by mouth daily.      . potassium chloride (K-DUR,KLOR-CON) 10 MEQ tablet Take 1 tablet (10 mEq total) by mouth 2 (two) times daily.  30 tablet  5  . pravastatin (PRAVACHOL) 40 MG tablet Take 1 tablet (40 mg total) by mouth at bedtime.  30 tablet  5  . rOPINIRole (REQUIP) 1 MG tablet Take 1 tablet (1 mg total) by mouth at bedtime.  30 tablet  11  . tiotropium (SPIRIVA HANDIHALER) 18 MCG inhalation capsule Place 1 capsule (18 mcg total) into inhaler and inhale every morning.  30 capsule  5  . torsemide (DEMADEX) 20 MG tablet Take 80 mg by mouth daily.      . metoCLOPramide (REGLAN) 5 MG tablet Take 1 tablet (5 mg total) by mouth every 8 (eight) hours as needed for nausea.  30 tablet  0  . sodium bicarbonate 650 MG tablet Take 650 mg by mouth 3 (three) times daily.        No current facility-administered medications for this visit.     Allergies (verified) Latex; Vancomycin; Vancomycin hcl; and Quinapril hcl   PAST HISTORY  Family History Family History  Problem Relation Age of Onset  . Emphysema Mother   . COPD Mother   . Colon cancer Father   . Diabetes Father   . Cancer Father     colon  . Hypertension Father   . Congestive Heart  Failure Maternal Grandmother   . Cancer Paternal Grandmother   . Rheum arthritis Sister   . Cancer Brother 52    colon CA    Social History History  Substance Use Topics  . Smoking status: Never Smoker   . Smokeless tobacco: Never Used  . Alcohol Use: No     Are there smokers in your home (other than you)? No  Risk Factors Current exercise habits: The patient does not participate in regular exercise at present.  Dietary issues discussed: limiting CHO   Cardiac risk factors: advanced age (older than 28 for men, 90 for women), diabetes mellitus, dyslipidemia, hypertension, obesity (BMI >= 30 kg/m2) and sedentary lifestyle.  Depression Screen (Note: if answer to either of the following is "Yes", a more complete depression screening is indicated)   Over the past 2 weeks, have you felt down, depressed or hopeless? No  Over the past 2 weeks, have you felt little interest or pleasure in doing things? No  Have you lost interest or pleasure in daily life? No  Do you often feel hopeless? No  Do you cry easily over simple problems? No  Activities of Daily Living In your present state of health, do you have any difficulty performing the following activities?:  Driving? No Managing money?  No Feeding yourself? No Getting from bed to chair? No  Climbing a flight of stairs? No Preparing food and eating?: No Bathing or showering? No Getting dressed: No Getting to the toilet? No Using the toilet:No Moving  around from place to place: No In the past year have you fallen or had a near fall?:Yes   Are you sexually active?  Yes  Do you have more than one partner?  No  Hearing Difficulties: Yes Do you often ask people to speak up or repeat themselves? Yes Do you experience ringing or noises in your ears? No Do you have difficulty understanding soft or whispered voices? No   Do you feel that you have a problem with memory? No  Do you often misplace items? No  Do you feel safe at home?   Yes  Cognitive Testing  Alert? Yes  Normal Appearance?Yes  Oriented to person? Yes  Place? Yes   Time? Yes  Recall of three objects?  Yes  Can perform simple calculations? Yes  Displays appropriate judgment?Yes  Can read the correct time from a watch face?Yes   Advanced Directives have been discussed with the patient? Yes  List the Names of Other Physician/Practitioners you currently use: 1.  Edison Simon - nephrology 2.  [facility] - GI 3.  [facility] - Cardiology 4.  [facility] - ortho / hand surgeon 5.  [facility] - Dermatology  Indicate any recent Medical Services you may have received from other than [facility] providers in the past year (date may be approximate).  Immunization History  Administered Date(s) Administered  . Tdap 10/03/2012    Screening Tests Health Maintenance  Topic Date Due  . Foot Exam  06/21/1959  . Ophthalmology Exam  06[birth year]21/1959  . Zostavax  06/20/2009  . Influenza Vaccine  10/22/2013  . Hemoglobin A1c  04/28/2014  . Urine Microalbumin  10/01/2014  . Mammogram  10/19/2015  . Pap Smear  10/22/2016  . Pneumococcal Polysaccharide Vaccine (##2) 12/22/2017  . Colonoscopy  08/28/2022  . Tetanus/tdap  10/04/2022    All answers were reviewed with the patient and necessary referrals were made:  Cherre Robins, [facility]   01/06/2014   History reviewed: allergies, current medications, past family history, past medical history, past social history, past surgical history and problem list   Objective:    Body mass index is 36.33 kg/(m^2). BP 110/60  Pulse 78  Ht 5\' 7"  (1.702 m)  Wt 232 lb (105.235 kg)  BMI 36.33 kg/m2  RBG = 262mg /dL   Assessment:     Initial Medicare Wellness Visit Type 2 DM - uncontrolled Medication Management     Plan:     During the course of the visit the patient was educated and counseled about appropriate screening and preventive services including:    Pneumococcal vaccine - given in office today  Influenza vaccine - given in office  today  Td vaccine - UTD  Screening mammography - UTD  Screening Pap smear and pelvic exam - UTD  Bone densitometry screening - UTD  Colorectal cancer screening - UTD  Diabetic Eye Exam needed - referral sent  Advanced directives: caring connections packet given  increase lantus to 65 units qam and 40 units qpm  Audiologist - Referral sent  Change iron supplement to pm to avoid potentially affecting levothyroxine absorption  Reviewed injection and inhaler technique   Patient Instructions (the written plan) was given to the patient.  Medicare Attestation I have personally reviewed: The patient's medical and social history Their use of alcohol, tobacco or illicit drugs Their current medications and supplements The patient's functional ability including ADLs,fall risks, home safety risks, cognitive, and hearing and visual impairment Diet and physical activities Evidence for depression or mood disorders  The patient's  weight, height, BMI, and visual acuity have been recorded in the chart.  I have made referrals, counseling, and provided education to the patient based on review of the above and I have provided the patient with a written personalized care plan for preventive services.     Cherre Robins, [facility]   01/06/2014      01/11/2014 Received correspondence from Dr Lowanda Foster regarding patient's last visit.  He verified that patient should stop furosemide and only take torsemide 80mg  daily. Patient notified by phone.

## 2014-01-06 NOTE — Patient Instructions (Signed)
Health Maintenance Summary    FOOT EXAM Done today 01/06/2014      OPHTHALMOLOGY EXAM Overdue 06/21/1960      ZOSTAVAX Overdue 06/20/2009  checking on insurance coverage    INFLUENZA VACCINE Done today 01/06/2014      HEMOGLOBIN A1C Next Due 01/2014      URINE MICROALBUMIN Next Due 10/01/2014      MAMMOGRAM Next Due 10/19/2015  done 07/2//2015    PAP SMEAR Next Due 10/22/2016      Pneumonia vaccine completed [id number]     Bone Density Next due  08/05/2014 Done 08/04/2012    COLONOSCOPY Next Due 08/28/2022  done 12/01/2013    TETANUS/TDAP Next Due 10/04/2022  done 10/03/2012       Increase Lantus to 65 units each morning and 40 units each evening  Switch iron supplement to evening due to possible effects on levothyroxine absorption    Diabetes and Standards of Medical Care  Diabetes is complicated. You may find that your diabetes team includes a dietitian, nurse, diabetes educator, eye doctor, and more. To help everyone know what is going on and to help you get the care you deserve, the following schedule of care was developed to help keep you on track. Below are the tests, exams, vaccines, medicines, education, and plans you will need.  Blood Glucose Goals Prior to meals = 80 - 130 Within 2 hours of the start of a meal = less than 180  HbA1c test (goal is less than 7.0% - your last value was 11.8%) This test shows how well you have controlled your glucose over the past 2 3 months. It is used to see if your diabetes management plan needs to be adjusted.   It is performed at least 2 times a year if you are meeting treatment goals.  It is performed 4 times a year if therapy has changed or if you are not meeting treatment goals.   Blood pressure test  This test is performed at every routine medical visit. The goal is less than 140/90 mmHg for most people, but 130/80 mmHg in some cases. Ask your health care provider about your goal. Dental exam  Follow up with the dentist regularly. Eye  exam  If you are diagnosed with type 1 diabetes as a child, get an exam upon reaching the age of 51 years or older and have had diabetes for 3 5 years. Yearly eye exams are recommended after that initial eye exam.  If you are diagnosed with type 1 diabetes as an adult, get an exam within 5 years of diagnosis and then yearly.  If you are diagnosed with type 2 diabetes, get an exam as soon as possible after the diagnosis and then yearly. Foot care exam  Visual foot exams are performed at every routine medical visit. The exams check for cuts, injuries, or other problems with the feet.  A comprehensive foot exam should be done yearly. This includes visual inspection as well as assessing foot pulses and testing for loss of sensation.  Check your feet nightly for cuts, injuries, or other problems with your feet. Tell your health care provider if anything is not healing. Kidney function test (urine microalbumin)  This test is performed once a year.  Type 1 diabetes: The first test is performed 5 years after diagnosis.  Type 2 diabetes: The first test is performed at the time of diagnosis.  A serum creatinine and estimated glomerular filtration rate (eGFR) test is done once a year  to assess the level of chronic kidney disease (CKD), if present. Lipid profile (cholesterol, HDL, LDL, triglycerides)  Performed every 5 years for most people.  The goal for LDL is less than 100 mg/dL. If you are at high risk, the goal is less than 70 mg/dL.  The goal for HDL is 40 mg/dL 50 mg/dL for men and 50 mg/dL 60 mg/dL for women. An HDL cholesterol of 60 mg/dL or higher gives some protection against heart disease.  The goal for triglycerides is less than 150 mg/dL. Influenza vaccine, pneumococcal vaccine, and hepatitis B vaccine  The influenza vaccine is recommended yearly.  The pneumococcal vaccine is generally given once in a lifetime. However, there are some instances when another vaccination is  recommended. Check with your health care provider.  The hepatitis B vaccine is also recommended for adults with diabetes. Diabetes self-management education  Education is recommended at diagnosis and ongoing as needed. Treatment plan  Your treatment plan is reviewed at every medical visit. Document Released: 01/05/2009 Document Revised: 11/10/2012 Document Reviewed: 08/10/2012 [facility] Patient Information 2014 [place].   Preventive Care for Adults A healthy lifestyle and preventive care can promote health and wellness. Preventive health guidelines for women include the following key practices.  A routine yearly physical is a good way to check with your health care provider about your health and preventive screening. It is a chance to share any concerns and updates on your health and to receive a thorough exam.  Visit your dentist for a routine exam and preventive care every 6 months. Brush your teeth twice a day and floss once a day. Good oral hygiene prevents tooth decay and gum disease.  The frequency of eye exams is based on your age, health, family medical history, use of contact lenses, and other factors. Follow your health care provider's recommendations for frequency of eye exams.  Eat a healthy diet. Foods like vegetables, fruits, whole grains, low-fat dairy products, and lean protein foods contain the nutrients you need without too many calories. Decrease your intake of foods high in solid fats, added sugars, and salt. Eat the right amount of calories for you.Get information about a proper diet from your health care provider, if necessary.  Regular physical exercise is one of the most important things you can do for your health. Most adults should get at least 150 minutes of moderate-intensity exercise (any activity that increases your heart rate and causes you to sweat) each week. In addition, most adults need muscle-strengthening exercises on 2 or more days a  week.  Maintain a healthy weight. The body mass index (BMI) is a screening tool to identify possible weight problems. It provides an estimate of body fat based on height and weight. Your health care provider can find your BMI and can help you achieve or maintain a healthy weight.For adults 20 years and older:  A BMI below 18.5 is considered underweight.  A BMI of 18.5 to 24.9 is normal.  A BMI of 25 to 29.9 is considered overweight.  A BMI of 30 and above is considered obese.  Maintain normal blood lipids and cholesterol levels by exercising and minimizing your intake of saturated fat. Eat a balanced diet with plenty of fruit and vegetables. Blood tests for lipids and cholesterol should begin at age 60 and be repeated every 5 years. If your lipid or cholesterol levels are high, you are over 50, or you are at high risk for heart disease, you may need your cholesterol levels  checked more frequently.Ongoing high lipid and cholesterol levels should be treated with medicines if diet and exercise are not working.  If you smoke, find out from your health care provider how to quit. If you do not use tobacco, do not start.  Lung cancer screening is recommended for adults aged 50-80 years who are at high risk for developing lung cancer because of a history of smoking. A yearly low-dose CT scan of the lungs is recommended for people who have at least a 30-pack-year history of smoking and are a current smoker or have quit within the past 15 years. A pack year of smoking is smoking an average of 1 pack of cigarettes a day for 1 year (for example: 1 pack a day for 30 years or 2 packs a day for 15 years). Yearly screening should continue until the smoker has stopped smoking for at least 15 years. Yearly screening should be stopped for people who develop a health problem that would prevent them from having lung cancer treatment.  If you are pregnant, do not drink alcohol. If you are breastfeeding, be very  cautious about drinking alcohol. If you are not pregnant and choose to drink alcohol, do not have more than 1 drink per day. One drink is considered to be 12 ounces (355 mL) of beer, 5 ounces (148 mL) of wine, or 1.5 ounces (44 mL) of liquor.  Avoid use of street drugs. Do not share needles with anyone. Ask for help if you need support or instructions about stopping the use of drugs.  High blood pressure causes heart disease and increases the risk of stroke. Your blood pressure should be checked at least every 1 to 2 years. Ongoing high blood pressure should be treated with medicines if weight loss and exercise do not work.  If you are 25-46 years old, ask your health care provider if you should take aspirin to prevent strokes.  Diabetes screening involves taking a blood sample to check your fasting blood sugar level. This should be done once every 3 years, after age 73, if you are within normal weight and without risk factors for diabetes. Testing should be considered at a younger age or be carried out more frequently if you are overweight and have at least 1 risk factor for diabetes.  Breast cancer screening is essential preventive care for women. You should practice "breast self-awareness." This means understanding the normal appearance and feel of your breasts and may include breast self-examination. Any changes detected, no matter how small, should be reported to a health care provider. Women in their 97s and 30s should have a clinical breast exam (CBE) by a health care provider as part of a regular health exam every 1 to 3 years. After age 85, women should have a CBE every year. Starting at age 78, women should consider having a mammogram (breast X-ray test) every year. Women who have a family history of breast cancer should talk to their health care provider about genetic screening. Women at a high risk of breast cancer should talk to their health care providers about having an MRI and a mammogram  every year.  Breast cancer gene (BRCA)-related cancer risk assessment is recommended for women who have family members with BRCA-related cancers. BRCA-related cancers include breast, ovarian, tubal, and peritoneal cancers. Having family members with these cancers may be associated with an increased risk for harmful changes (mutations) in the breast cancer genes BRCA1 and BRCA2. Results of the assessment will determine the need  for genetic counseling and BRCA1 and BRCA2 testing.  Routine pelvic exams to screen for cancer are no longer recommended for nonpregnant women who are considered low risk for cancer of the pelvic organs (ovaries, uterus, and vagina) and who do not have symptoms. Ask your health care provider if a screening pelvic exam is right for you.  If you have had past treatment for cervical cancer or a condition that could lead to cancer, you need Pap tests and screening for cancer for at least 20 years after your treatment. If Pap tests have been discontinued, your risk factors (such as having a new sexual partner) need to be reassessed to determine if screening should be resumed. Some women have medical problems that increase the chance of getting cervical cancer. In these cases, your health care provider may recommend more frequent screening and Pap tests.  The HPV test is an additional test that may be used for cervical cancer screening. The HPV test looks for the virus that can cause the cell changes on the cervix. The cells collected during the Pap test can be tested for HPV. The HPV test could be used to screen women aged 79 years and older, and should be used in women of any age who have unclear Pap test results. After the age of 43, women should have HPV testing at the same frequency as a Pap test.  Colorectal cancer can be detected and often prevented. Most routine colorectal cancer screening begins at the age of 74 years and continues through age 8 years. However, your health care  provider may recommend screening at an earlier age if you have risk factors for colon cancer. On a yearly basis, your health care provider may provide home test kits to check for hidden blood in the stool. Use of a small camera at the end of a tube, to directly examine the colon (sigmoidoscopy or colonoscopy), can detect the earliest forms of colorectal cancer. Talk to your health care provider about this at age 74, when routine screening begins. Direct exam of the colon should be repeated every 5-10 years through age 26 years, unless early forms of pre-cancerous polyps or small growths are found.  People who are at an increased risk for hepatitis B should be screened for this virus. You are considered at high risk for hepatitis B if:  You were born in a country where hepatitis B occurs often. Talk with your health care provider about which countries are considered high risk.  Your parents were born in a high-risk country and you have not received a shot to protect against hepatitis B (hepatitis B vaccine).  You have HIV or AIDS.  You use needles to inject street drugs.  You live with, or have sex with, someone who has hepatitis B.  You get hemodialysis treatment.  You take certain medicines for conditions like cancer, organ transplantation, and autoimmune conditions.  Hepatitis C blood testing is recommended for all people born from [birth year] through 1965 and any individual with known risks for hepatitis C.  Practice safe sex. Use condoms and avoid high-risk sexual practices to reduce the spread of sexually transmitted infections (STIs). STIs include gonorrhea, chlamydia, syphilis, trichomonas, herpes, HPV, and human immunodeficiency virus (HIV). Herpes, HIV, and HPV are viral illnesses that have no cure. They can result in disability, cancer, and death.  You should be screened for sexually transmitted illnesses (STIs) including gonorrhea and chlamydia if:  You are sexually active and are  younger than 24 years.  You are older than 24 years and your health care provider tells you that you are at risk for this type of infection.  Your sexual activity has changed since you were last screened and you are at an increased risk for chlamydia or gonorrhea. Ask your health care provider if you are at risk.  If you are at risk of being infected with HIV, it is recommended that you take a prescription medicine daily to prevent HIV infection. This is called preexposure prophylaxis (PrEP). You are considered at risk if:  You are a heterosexual woman, are sexually active, and are at increased risk for HIV infection.  You take drugs by injection.  You are sexually active with a partner who has HIV.  Talk with your health care provider about whether you are at high risk of being infected with HIV. If you choose to begin PrEP, you should first be tested for HIV. You should then be tested every 3 months for as long as you are taking PrEP.  Osteoporosis is a disease in which the bones lose minerals and strength with aging. This can result in serious bone fractures or breaks. The risk of osteoporosis can be identified using a bone density scan. Women ages 65 years and over and women at risk for fractures or osteoporosis should discuss screening with their health care providers. Ask your health care provider whether you should take a calcium supplement or vitamin D to reduce the rate of osteoporosis.  Menopause can be associated with physical symptoms and risks. Hormone replacement therapy is available to decrease symptoms and risks. You should talk to your health care provider about whether hormone replacement therapy is right for you.  Use sunscreen. Apply sunscreen liberally and repeatedly throughout the day. You should seek shade when your shadow is shorter than you. Protect yourself by wearing long sleeves, pants, a wide-brimmed hat, and sunglasses year round, whenever you are outdoors.  Once a  month, do a whole body skin exam, using a mirror to look at the skin on your back. Tell your health care provider of new moles, moles that have irregular borders, moles that are larger than a pencil eraser, or moles that have changed in shape or color.  Stay current with required vaccines (immunizations).  Influenza vaccine. All adults should be immunized every year.  Tetanus, diphtheria, and acellular pertussis (Td, Tdap) vaccine. Pregnant women should receive 1 dose of Tdap vaccine during each pregnancy. The dose should be obtained regardless of the length of time since the last dose. Immunization is preferred during the 27th-36th week of gestation. An adult who has not previously received Tdap or who does not know her vaccine status should receive 1 dose of Tdap. This initial dose should be followed by tetanus and diphtheria toxoids (Td) booster doses every 10 years. Adults with an unknown or incomplete history of completing a 3-dose immunization series with Td-containing vaccines should begin or complete a primary immunization series including a Tdap dose. Adults should receive a Td booster every 10 years.  Varicella vaccine. An adult without evidence of immunity to varicella should receive 2 doses or a second dose if she has previously received 1 dose. Pregnant females who do not have evidence of immunity should receive the first dose after pregnancy. This first dose should be obtained before leaving the health care facility. The second dose should be obtained 4-8 weeks after the first dose.  Human papillomavirus (HPV) vaccine. Females aged 13-26 years who have not received the  vaccine previously should obtain the 3-dose series. The vaccine is not recommended for use in pregnant females. However, pregnancy testing is not needed before receiving a dose. If a female is found to be pregnant after receiving a dose, no treatment is needed. In that case, the remaining doses should be delayed until after the  pregnancy. Immunization is recommended for any person with an immunocompromised condition through the age of 36 years if she did not get any or all doses earlier. During the 3-dose series, the second dose should be obtained 4-8 weeks after the first dose. The third dose should be obtained 24 weeks after the first dose and 16 weeks after the second dose.  Zoster vaccine. One dose is recommended for adults aged 29 years or older unless certain conditions are present.  Measles, mumps, and rubella (MMR) vaccine. Adults born before [birth year] generally are considered immune to measles and mumps. Adults born in [birth year] or later should have 1 or more doses of MMR vaccine unless there is a contraindication to the vaccine or there is laboratory evidence of immunity to each of the three diseases. A routine second dose of MMR vaccine should be obtained at least 28 days after the first dose for students attending postsecondary schools, health care workers, or international travelers. People who received inactivated measles vaccine or an unknown type of measles vaccine during 1963-1967 should receive 2 doses of MMR vaccine. People who received inactivated mumps vaccine or an unknown type of mumps vaccine before 1979 and are at high risk for mumps infection should consider immunization with 2 doses of MMR vaccine. For females of childbearing age, rubella immunity should be determined. If there is no evidence of immunity, females who are not pregnant should be vaccinated. If there is no evidence of immunity, females who are pregnant should delay immunization until after pregnancy. Unvaccinated health care workers born before [birth year] who lack laboratory evidence of measles, mumps, or rubella immunity or laboratory confirmation of disease should consider measles and mumps immunization with 2 doses of MMR vaccine or rubella immunization with 1 dose of MMR vaccine.  Pneumococcal 13-valent conjugate (PCV13) vaccine. When indicated, a person  who is uncertain of her immunization history and has no record of immunization should receive the PCV13 vaccine. An adult aged 64 years or older who has certain medical conditions and has not been previously immunized should receive 1 dose of PCV13 vaccine. This PCV13 should be followed with a dose of pneumococcal polysaccharide (PPSV23) vaccine. The PPSV23 vaccine dose should be obtained at least 8 weeks after the dose of PCV13 vaccine. An adult aged 66 years or older who has certain medical conditions and previously received 1 or more doses of PPSV23 vaccine should receive 1 dose of PCV13. The PCV13 vaccine dose should be obtained 1 or more years after the last PPSV23 vaccine dose.  Pneumococcal polysaccharide (PPSV23) vaccine. When PCV13 is also indicated, PCV13 should be obtained first. All adults aged 44 years and older should be immunized. An adult younger than age 42 years who has certain medical conditions should be immunized. Any person who resides in a nursing home or long-term care facility should be immunized. An adult smoker should be immunized. People with an immunocompromised condition and certain other conditions should receive both PCV13 and PPSV23 vaccines. People with human immunodeficiency virus (HIV) infection should be immunized as soon as possible after diagnosis. Immunization during chemotherapy or radiation therapy should be avoided. Routine use of PPSV23 vaccine is not recommended for  American Indians, [place], or people younger than 67 years unless there are medical conditions that require PPSV23 vaccine. When indicated, people who have unknown immunization and have no record of immunization should receive PPSV23 vaccine. One-time revaccination 5 years after the first dose of PPSV23 is recommended for people aged 19-64 years who have chronic kidney failure, nephrotic syndrome, asplenia, or immunocompromised conditions. People who received 1-2 doses of PPSV23 before age 51 years  should receive another dose of PPSV23 vaccine at age 43 years or later if at least 5 years have passed since the previous dose. Doses of PPSV23 are not needed for people immunized with PPSV23 at or after age 28 years.  Meningococcal vaccine. Adults with asplenia or persistent complement component deficiencies should receive 2 doses of quadrivalent meningococcal conjugate (MenACWY-D) vaccine. The doses should be obtained at least 2 months apart. Microbiologists working with certain meningococcal bacteria, [place] recruits, people at risk during an outbreak, and people who travel to or live in countries with a high rate of meningitis should be immunized. A first-year college student up through age 63 years who is living in a residence hall should receive a dose if she did not receive a dose on or after her 16th birthday. Adults who have certain high-risk conditions should receive one or more doses of vaccine.  Hepatitis A vaccine. Adults who wish to be protected from this disease, have certain high-risk conditions, work with hepatitis A-infected animals, work in hepatitis A research labs, or travel to or work in countries with a high rate of hepatitis A should be immunized. Adults who were previously unvaccinated and who anticipate close contact with an international adoptee during the first 60 days after arrival in the [place] from a country with a high rate of hepatitis A should be immunized.  Hepatitis B vaccine. Adults who wish to be protected from this disease, have certain high-risk conditions, may be exposed to blood or other infectious body fluids, are household contacts or sex partners of hepatitis B positive people, are clients or workers in certain care facilities, or travel to or work in countries with a high rate of hepatitis B should be immunized.  Haemophilus influenzae type b (Hib) vaccine. A previously unvaccinated person with asplenia or sickle cell disease or having a scheduled  splenectomy should receive 1 dose of Hib vaccine. Regardless of previous immunization, a recipient of a hematopoietic stem cell transplant should receive a 3-dose series 6-12 months after her successful transplant. Hib vaccine is not recommended for adults with HIV infection. Preventive Services / Frequency Ages 10 years and over  Blood pressure check.** / Every 1 to 2 years.  Lipid and cholesterol check.** / Every 5 years beginning at age 83 years.  Lung cancer screening. / Every year if you are aged 55-80 years and have a 30-pack-year history of smoking and currently smoke or have quit within the past 15 years. Yearly screening is stopped once you have quit smoking for at least 15 years or develop a health problem that would prevent you from having lung cancer treatment.  Clinical breast exam.** / Every year after age 60 years.  BRCA-related cancer risk assessment.** / For women who have family members with a BRCA-related cancer (breast, ovarian, tubal, or peritoneal cancers).  Mammogram.** / Every year beginning at age 7 years and continuing for as long as you are in good health. Consult with your health care provider.  Pap test.** / Every 3 years starting at age 27 years  through age 89 or 28 years with 3 consecutive normal Pap tests. Testing can be stopped between 65 and 70 years with 3 consecutive normal Pap tests and no abnormal Pap or HPV tests in the past 10 years.  HPV screening.** / Every 3 years from ages 56 years through ages 9 or 59 years with a history of 3 consecutive normal Pap tests. Testing can be stopped between 65 and 70 years with 3 consecutive normal Pap tests and no abnormal Pap or HPV tests in the past 10 years.  Fecal occult blood test (FOBT) of stool. / Every year beginning at age 24 years and continuing until age 82 years. You may not need to do this test if you get a colonoscopy every 10 years.  Flexible sigmoidoscopy or colonoscopy.** / Every 5 years for a  flexible sigmoidoscopy or every 10 years for a colonoscopy beginning at age 52 years and continuing until age 52 years.  Hepatitis C blood test.** / For all people born from [birth year] through 1966 and any individual with known risks for hepatitis C.  Osteoporosis screening.** / A one-time screening for women ages 14 years and over and women at risk for fractures or osteoporosis.  Skin self-exam. / Monthly.  Influenza vaccine. / Every year.  Tetanus, diphtheria, and acellular pertussis (Tdap/Td) vaccine.** / 1 dose of Td every 10 years.  Varicella vaccine.** / Consult your health care provider.  Zoster vaccine.** / 1 dose for adults aged 38 years or older.  Pneumococcal 13-valent conjugate (PCV13) vaccine.** / Consult your health care provider.  Pneumococcal polysaccharide (PPSV23) vaccine.** / 1 dose for all adults aged 54 years and older.  Meningococcal vaccine.** / Consult your health care provider.  Hepatitis A vaccine.** / Consult your health care provider.  Hepatitis B vaccine.** / Consult your health care provider.  Haemophilus influenzae type b (Hib) vaccine.** / Consult your health care provider. ** Family history and personal history of risk and conditions may change your health care provider's recommendations. Document Released: 05/06/2001 Document Revised: 07/25/2013 Document Reviewed: 08/05/2010 [facility] Patient Information 2015 [place], [place]. This information is not intended to replace advice given to you by your health care provider. Make sure you discuss any questions you have with your health care provider.

## 2014-01-09 ENCOUNTER — Telehealth: Payer: Self-pay | Admitting: Pharmacist

## 2014-01-09 ENCOUNTER — Telehealth: Payer: Self-pay | Admitting: Family Medicine

## 2014-01-09 ENCOUNTER — Encounter: Payer: Self-pay | Admitting: Internal Medicine

## 2014-01-10 MED ORDER — FLUCONAZOLE 150 MG PO TABS: 150.0000 mg | ORAL_TABLET | Freq: Once | ORAL | Status: DC

## 2014-01-10 NOTE — Telephone Encounter (Signed)
Wants something called in for a yeast infection

## 2014-01-10 NOTE — Telephone Encounter (Signed)
Diflucan rx  Sent to pharmacy

## 2014-01-11 NOTE — Addendum Note (Signed)
Addended by: Cherre Robins on: 01/11/2014 08:28 AM   Modules accepted: Orders, Medications

## 2014-01-30 ENCOUNTER — Telehealth: Payer: Self-pay | Admitting: Family Medicine

## 2014-01-30 NOTE — Telephone Encounter (Signed)
APPT MADE

## 2014-02-01 ENCOUNTER — Ambulatory Visit: Payer: Medicare HMO | Admitting: Family Medicine

## 2014-02-09 ENCOUNTER — Ambulatory Visit: Payer: Medicare HMO

## 2014-02-10 ENCOUNTER — Other Ambulatory Visit: Payer: Self-pay | Admitting: *Deleted

## 2014-02-10 ENCOUNTER — Ambulatory Visit (INDEPENDENT_AMBULATORY_CARE_PROVIDER_SITE_OTHER): Payer: Commercial Managed Care - HMO | Admitting: Family Medicine

## 2014-02-10 VITALS — BP 126/64 | HR 75 | Temp 98.2°F | Ht 67.0 in | Wt 227.0 lb

## 2014-02-10 DIAGNOSIS — L309 Dermatitis, unspecified: Secondary | ICD-10-CM

## 2014-02-10 MED ORDER — HYDROXYZINE HCL 25 MG PO TABS: 25.0000 mg | ORAL_TABLET | Freq: Three times a day (TID) | ORAL | Status: DC | PRN

## 2014-02-10 MED ORDER — METHYLPREDNISOLONE ACETATE 80 MG/ML IJ SUSP
80.0000 mg | Freq: Once | INTRAMUSCULAR | Status: AC
  Administered 2014-02-10: 80 mg via INTRAMUSCULAR

## 2014-02-10 MED ORDER — METHYLPREDNISOLONE (PAK) 4 MG PO TABS: ORAL_TABLET | ORAL | Status: DC

## 2014-02-10 NOTE — Progress Notes (Signed)
   Subjective:    Patient ID: [name], female    DOB: Sep 11, 1950, 63 y.o.   MRN: [id number]  HPI Patient is here with c/o allergic rxn to her dryer towels and she is having rash all over and she c/o pruritis.  Review of Systems  Constitutional: Negative for fever.  HENT: Negative for ear pain.   Eyes: Negative for discharge.  Respiratory: Negative for cough.   Cardiovascular: Negative for chest pain.  Gastrointestinal: Negative for abdominal distention.  Endocrine: Negative for polyuria.  Genitourinary: Negative for difficulty urinating.  Musculoskeletal: Negative for gait problem and neck pain.  Skin: Negative for color change and rash.  Neurological: Negative for speech difficulty and headaches.  Psychiatric/Behavioral: Negative for agitation.       Objective:    BP 126/64 mmHg  Pulse 75  Temp(Src) 98.2 F (36.8 C) (Oral)  Ht 5\' 7"  (1.702 m)  Wt 227 lb (102.967 kg)  BMI 35.55 kg/m2 Physical Exam  Constitutional: She is oriented to person, place, and time. She appears well-developed and well-nourished.  HENT:  Head: Normocephalic and atraumatic.  Mouth/Throat: Oropharynx is clear and moist.  Eyes: Pupils are equal, round, and reactive to light.  Neck: Normal range of motion. Neck supple.  Cardiovascular: Normal rate and regular rhythm.   No murmur heard. Pulmonary/Chest: Effort normal and breath sounds normal.  Abdominal: Soft. Bowel sounds are normal. There is no tenderness.  Neurological: She is alert and oriented to person, place, and time.  Skin: Skin is warm and dry.  Psychiatric: She has a normal mood and affect.          Assessment & Plan:     ICD-9-CM ICD-10-CM   1. Dermatitis 692.9 L30.9 methylPREDNISolone acetate (DEPO-MEDROL) injection 80 mg     hydrOXYzine (ATARAX/VISTARIL) 25 MG tablet     methylPREDNIsolone (MEDROL DOSPACK) 4 MG tablet     Return if symptoms worsen or fail to improve.  Lysbeth Penner FNP

## 2014-02-10 NOTE — Telephone Encounter (Signed)
Patient is requesting a refill on her hydrocodone

## 2014-02-13 ENCOUNTER — Telehealth: Payer: Self-pay | Admitting: *Deleted

## 2014-02-13 MED ORDER — HYDROCODONE-ACETAMINOPHEN 10-325 MG PO TABS: 1.0000 | ORAL_TABLET | Freq: Four times a day (QID) | ORAL | Status: DC | PRN

## 2014-02-13 NOTE — Telephone Encounter (Signed)
Okay to refill medicine per patient request

## 2014-02-13 NOTE — Telephone Encounter (Signed)
Aware, script for hydrocodone ready. 

## 2014-02-14 ENCOUNTER — Other Ambulatory Visit: Payer: Self-pay

## 2014-02-14 MED ORDER — GABAPENTIN 800 MG PO TABS: 800.0000 mg | ORAL_TABLET | Freq: Three times a day (TID) | ORAL | Status: DC

## 2014-02-14 NOTE — Telephone Encounter (Signed)
Last seen 02/10/14   [facility] [place]  This med not on EPIC list

## 2014-02-14 NOTE — Telephone Encounter (Signed)
Is on epic list- refilled

## 2014-02-24 ENCOUNTER — Telehealth: Payer: Self-pay | Admitting: Family Medicine

## 2014-02-24 ENCOUNTER — Encounter: Payer: Self-pay | Admitting: Physician Assistant

## 2014-02-24 ENCOUNTER — Ambulatory Visit (INDEPENDENT_AMBULATORY_CARE_PROVIDER_SITE_OTHER): Payer: Commercial Managed Care - HMO | Admitting: Physician Assistant

## 2014-02-24 VITALS — BP 122/68 | HR 77 | Temp 97.9°F | Ht 67.0 in | Wt 228.0 lb

## 2014-02-24 DIAGNOSIS — B86 Scabies: Secondary | ICD-10-CM

## 2014-02-24 MED ORDER — TRIAMCINOLONE ACETONIDE 0.1 % EX CREA: 1.0000 "application " | TOPICAL_CREAM | Freq: Two times a day (BID) | CUTANEOUS | Status: DC

## 2014-02-24 MED ORDER — PERMETHRIN 5 % EX CREA: 1.0000 "application " | TOPICAL_CREAM | Freq: Once | CUTANEOUS | Status: DC

## 2014-02-24 NOTE — Progress Notes (Signed)
   Subjective:    Patient ID: [name], female    DOB: 11-Sep-1950, 63 y.o.   MRN: [id number]  HPI63 y/o female presents with very pruritic rash x 2 months. Has been treated with prednisone and Depomedrol with no relief. Visits a nursing facility daily and states that they have had patients with similar symptoms.     Review of Systems  Skin: Negative for pallor. Rash: numerous papules and excoriations widespread on body, neck down inclurding hands and feet.       Objective:   Physical Exam  Skin: Rash (numerous erythematous papules and excoriations neck down including webspaces of hands and fieet) noted.          Assessment & Plan:  1. Scabies: Rx Elimite lotion as directed. Repeat in 7 days. Instructions were given for home treatment. If not improved after treatment, advised patient that it may be a drug or id reaction and she may need to be referred to a Dermatologist. TAC .1% cream was also give for itch relief.

## 2014-02-24 NOTE — Patient Instructions (Signed)

## 2014-02-27 ENCOUNTER — Ambulatory Visit (INDEPENDENT_AMBULATORY_CARE_PROVIDER_SITE_OTHER): Payer: Commercial Managed Care - HMO | Admitting: Family Medicine

## 2014-02-27 ENCOUNTER — Telehealth: Payer: Self-pay | Admitting: Family Medicine

## 2014-02-27 ENCOUNTER — Encounter: Payer: Self-pay | Admitting: Family Medicine

## 2014-02-27 VITALS — BP 129/62 | HR 74 | Temp 98.2°F | Ht 67.0 in | Wt 228.0 lb

## 2014-02-27 DIAGNOSIS — L309 Dermatitis, unspecified: Secondary | ICD-10-CM

## 2014-02-27 MED ORDER — PREDNISONE 20 MG PO TABS: ORAL_TABLET | ORAL | Status: DC

## 2014-02-27 MED ORDER — HYDROXYZINE HCL 25 MG PO TABS: 25.0000 mg | ORAL_TABLET | Freq: Three times a day (TID) | ORAL | Status: DC | PRN

## 2014-02-27 NOTE — Telephone Encounter (Signed)
Pt given appt today with Bill at 11:15.

## 2014-02-27 NOTE — Progress Notes (Signed)
   Subjective:    Patient ID: [name], female    DOB: [birth year], 63 y.o.   MRN: [id number]  HPI Patient was seen last week for scabies and she states she is not better.  She was originally seen on 02/10/14 for rash and was tx'd with medrol dose pack and atarax and she states this helped but then returned.  She was seen last week and tx'd with permethrin and this didn't help and she has rash every where that is itching.  Review of Systems  Constitutional: Negative for fever.  HENT: Negative for ear pain.   Eyes: Negative for discharge.  Respiratory: Negative for cough.   Cardiovascular: Negative for chest pain.  Gastrointestinal: Negative for abdominal distention.  Endocrine: Negative for polyuria.  Genitourinary: Negative for difficulty urinating.  Musculoskeletal: Negative for gait problem and neck pain.  Skin: Positive for rash. Negative for color change.  Neurological: Negative for speech difficulty and headaches.  Psychiatric/Behavioral: Negative for agitation.       Objective:    BP 129/62 mmHg  Pulse 74  Temp(Src) 98.2 F (36.8 C) (Oral)  Ht 5\' 7"  (1.702 m)  Wt 228 lb (103.42 kg)  BMI 35.70 kg/m2 Physical Exam  Constitutional: She is oriented to person, place, and time. She appears well-developed and well-nourished.  HENT:  Head: Normocephalic and atraumatic.  Mouth/Throat: Oropharynx is clear and moist.  Eyes: Pupils are equal, round, and reactive to light.  Neck: Normal range of motion. Neck supple.  Cardiovascular: Normal rate and regular rhythm.   No murmur heard. Pulmonary/Chest: Effort normal and breath sounds normal.  Abdominal: Soft. Bowel sounds are normal. There is no tenderness.  Neurological: She is alert and oriented to person, place, and time.  Skin: Skin is warm and dry.  Erythematous papular excoriated rash on arms, legs, abdomen, and chest.  Psychiatric: She has a normal mood and affect.          Assessment & Plan:     ICD-9-CM  ICD-10-CM   1. Dermatitis 692.9 L30.9 predniSONE (DELTASONE) 20 MG tablet     hydrOXYzine (ATARAX/VISTARIL) 25 MG tablet   Patient is advised to go see her Dermatologist ASAP and she will call and schedule hersef.  No Follow-up on file.  Lysbeth Penner FNP

## 2014-03-02 ENCOUNTER — Encounter (HOSPITAL_COMMUNITY): Payer: Self-pay | Admitting: Vascular Surgery

## 2014-03-04 ENCOUNTER — Emergency Department (HOSPITAL_COMMUNITY)
Admission: EM | Admit: 2014-03-04 | Discharge: 2014-03-04 | Disposition: A | Discharge status: Home / Self Care | Payer: Commercial Managed Care - HMO | Attending: Emergency Medicine | Admitting: Emergency Medicine

## 2014-03-04 ENCOUNTER — Encounter (HOSPITAL_COMMUNITY): Payer: Self-pay | Admitting: Emergency Medicine

## 2014-03-04 DIAGNOSIS — I2510 Atherosclerotic heart disease of native coronary artery without angina pectoris: Secondary | ICD-10-CM | POA: Insufficient documentation

## 2014-03-04 DIAGNOSIS — E669 Obesity, unspecified: Secondary | ICD-10-CM | POA: Insufficient documentation

## 2014-03-04 DIAGNOSIS — E1165 Type 2 diabetes mellitus with hyperglycemia: Secondary | ICD-10-CM | POA: Insufficient documentation

## 2014-03-04 DIAGNOSIS — Z79899 Other long term (current) drug therapy: Secondary | ICD-10-CM | POA: Insufficient documentation

## 2014-03-04 DIAGNOSIS — I129 Hypertensive chronic kidney disease with stage 1 through stage 4 chronic kidney disease, or unspecified chronic kidney disease: Secondary | ICD-10-CM | POA: Insufficient documentation

## 2014-03-04 DIAGNOSIS — Z794 Long term (current) use of insulin: Secondary | ICD-10-CM | POA: Diagnosis not present

## 2014-03-04 DIAGNOSIS — R739 Hyperglycemia, unspecified: Secondary | ICD-10-CM

## 2014-03-04 DIAGNOSIS — M109 Gout, unspecified: Secondary | ICD-10-CM | POA: Insufficient documentation

## 2014-03-04 DIAGNOSIS — Z85828 Personal history of other malignant neoplasm of skin: Secondary | ICD-10-CM | POA: Diagnosis not present

## 2014-03-04 DIAGNOSIS — G629 Polyneuropathy, unspecified: Secondary | ICD-10-CM | POA: Insufficient documentation

## 2014-03-04 DIAGNOSIS — Z7952 Long term (current) use of systemic steroids: Secondary | ICD-10-CM | POA: Diagnosis not present

## 2014-03-04 DIAGNOSIS — E860 Dehydration: Secondary | ICD-10-CM | POA: Diagnosis not present

## 2014-03-04 DIAGNOSIS — Z7951 Long term (current) use of inhaled steroids: Secondary | ICD-10-CM | POA: Insufficient documentation

## 2014-03-04 DIAGNOSIS — Z872 Personal history of diseases of the skin and subcutaneous tissue: Secondary | ICD-10-CM | POA: Diagnosis not present

## 2014-03-04 DIAGNOSIS — R21 Rash and other nonspecific skin eruption: Secondary | ICD-10-CM | POA: Diagnosis not present

## 2014-03-04 DIAGNOSIS — J449 Chronic obstructive pulmonary disease, unspecified: Secondary | ICD-10-CM | POA: Diagnosis not present

## 2014-03-04 DIAGNOSIS — F329 Major depressive disorder, single episode, unspecified: Secondary | ICD-10-CM | POA: Insufficient documentation

## 2014-03-04 DIAGNOSIS — I252 Old myocardial infarction: Secondary | ICD-10-CM | POA: Diagnosis not present

## 2014-03-04 DIAGNOSIS — G2581 Restless legs syndrome: Secondary | ICD-10-CM | POA: Insufficient documentation

## 2014-03-04 DIAGNOSIS — E039 Hypothyroidism, unspecified: Secondary | ICD-10-CM | POA: Diagnosis not present

## 2014-03-04 DIAGNOSIS — K219 Gastro-esophageal reflux disease without esophagitis: Secondary | ICD-10-CM | POA: Diagnosis not present

## 2014-03-04 DIAGNOSIS — E119 Type 2 diabetes mellitus without complications: Secondary | ICD-10-CM | POA: Diagnosis not present

## 2014-03-04 DIAGNOSIS — Z792 Long term (current) use of antibiotics: Secondary | ICD-10-CM | POA: Insufficient documentation

## 2014-03-04 DIAGNOSIS — Z91040 Latex allergy status: Secondary | ICD-10-CM | POA: Diagnosis not present

## 2014-03-04 DIAGNOSIS — E785 Hyperlipidemia, unspecified: Secondary | ICD-10-CM | POA: Diagnosis not present

## 2014-03-04 DIAGNOSIS — N183 Chronic kidney disease, stage 3 (moderate): Secondary | ICD-10-CM | POA: Insufficient documentation

## 2014-03-04 DIAGNOSIS — D649 Anemia, unspecified: Secondary | ICD-10-CM | POA: Insufficient documentation

## 2014-03-04 LAB — CBC WITH DIFFERENTIAL/PLATELET
BASOS PCT: 0 % (ref 0–1)
Basophils Absolute: 0 10*3/uL (ref 0.0–0.1)
EOS ABS: 0.4 10*3/uL (ref 0.0–0.7)
Eosinophils Relative: 4 % (ref 0–5)
HCT: 37.9 % (ref 36.0–46.0)
Hemoglobin: 12.7 g/dL (ref 12.0–15.0)
Lymphocytes Relative: 20 % (ref 12–46)
Lymphs Abs: 1.9 10*3/uL (ref 0.7–4.0)
MCH: 28.5 pg (ref 26.0–34.0)
MCHC: 33.5 g/dL (ref 30.0–36.0)
MCV: 85 fL (ref 78.0–100.0)
Monocytes Absolute: 0.5 10*3/uL (ref 0.1–1.0)
Monocytes Relative: 5 % (ref 3–12)
NEUTROS PCT: 71 % (ref 43–77)
Neutro Abs: 6.6 10*3/uL (ref 1.7–7.7)
PLATELETS: 268 10*3/uL (ref 150–400)
RBC: 4.46 MIL/uL (ref 3.87–5.11)
RDW: 13.9 % (ref 11.5–15.5)
WBC: 9.4 10*3/uL (ref 4.0–10.5)

## 2014-03-04 LAB — URINALYSIS, ROUTINE W REFLEX MICROSCOPIC
BILIRUBIN URINE: NEGATIVE
KETONES UR: NEGATIVE mg/dL
Nitrite: NEGATIVE
PH: 6 (ref 5.0–8.0)
Protein, ur: NEGATIVE mg/dL
Specific Gravity, Urine: <1.005 — ABNORMAL LOW (ref 1.005–1.030)
Urobilinogen, UA: 0.2 mg/dL (ref 0.0–1.0)

## 2014-03-04 LAB — COMPREHENSIVE METABOLIC PANEL
ALT: 16 U/L (ref 0–35)
ANION GAP: 19 — AB (ref 5–15)
AST: 10 U/L (ref 0–37)
Albumin: 3.3 g/dL — ABNORMAL LOW (ref 3.5–5.2)
Alkaline Phosphatase: 172 U/L — ABNORMAL HIGH (ref 39–117)
BUN: 47 mg/dL — AB (ref 6–23)
CALCIUM: 9 mg/dL (ref 8.4–10.5)
CO2: 24 mEq/L (ref 19–32)
Chloride: 84 mEq/L — ABNORMAL LOW (ref 96–112)
Creatinine, Ser: 2.12 mg/dL — ABNORMAL HIGH (ref 0.50–1.10)
GFR calc Af Amer: 27 mL/min — ABNORMAL LOW (ref >90)
GFR calc non Af Amer: 23 mL/min — ABNORMAL LOW (ref >90)
GLUCOSE: 735 mg/dL — AB (ref 70–99)
Potassium: 4.2 mEq/L (ref 3.7–5.3)
SODIUM: 127 meq/L — AB (ref 137–147)
TOTAL PROTEIN: 7.8 g/dL (ref 6.0–8.3)
Total Bilirubin: 0.4 mg/dL (ref 0.3–1.2)

## 2014-03-04 LAB — CBG MONITORING, ED
Glucose-Capillary: >600 mg/dL (ref 70–99)
Glucose-Capillary: 393 mg/dL — ABNORMAL HIGH (ref 70–99)
Glucose-Capillary: 258 mg/dL — ABNORMAL HIGH (ref 70–99)

## 2014-03-04 LAB — URINE MICROSCOPIC-ADD ON

## 2014-03-04 MED ORDER — DIPHENHYDRAMINE HCL 50 MG/ML IJ SOLN
25.0000 mg | Freq: Once | INTRAMUSCULAR | Status: AC
  Administered 2014-03-04: 25 mg via INTRAVENOUS

## 2014-03-04 MED ORDER — SODIUM CHLORIDE 0.9 % IV BOLUS (SEPSIS)
2000.0000 mL | Freq: Once | INTRAVENOUS | Status: AC
  Administered 2014-03-04: 2000 mL via INTRAVENOUS

## 2014-03-04 MED ORDER — DIPHENHYDRAMINE HCL 50 MG/ML IJ SOLN
INTRAMUSCULAR | Status: AC
  Filled 2014-03-04: qty 1

## 2014-03-04 MED ORDER — INSULIN ASPART 100 UNIT/ML ~~LOC~~ SOLN
10.0000 [IU] | Freq: Once | SUBCUTANEOUS | Status: AC
  Administered 2014-03-04: 10 [IU] via INTRAVENOUS
  Filled 2014-03-04: qty 1

## 2014-03-04 NOTE — ED Notes (Signed)
PT now reports high cbg at home. Blood glucose reading HI in triage. PT denies any insulin use for the past few days.

## 2014-03-04 NOTE — Discharge Instructions (Signed)

## 2014-03-04 NOTE — ED Notes (Signed)
PT c/o being "tired" all the time for about a week and continuously sleeping. PT states that her rash is worse and itching more after seeing a dermatologist. PT states she is on keflex for rash prescribed by dermatologist.

## 2014-03-04 NOTE — ED Provider Notes (Signed)
CSN: [id number]     Arrival date & time 03/04/14  1353 History   First MD Initiated Contact with Patient 03/04/14 1515     Chief Complaint  Patient presents with  . Hyperglycemia     HPI Patient presents to the emergency department complaining of itch and rash with past several months.  She also reports for the past several days she's had generalized weakness and feels more tired than usual.  She denies chest pain or shortness of breath.  No fevers or chills.  She states her dermatologist believes that her rash is secondary to dry skin.  She was on steroids last week but her dermatologist took her off because she said they would not be of benefit.   Past Medical History  Diagnosis Date  . Chest pain 2003    ?h/o MI, syncope; 2003 stress nuclear-inferoseptal scar, EF-54%; 2007-MI ruled out, echocardiogram-normal LV size, wall thickness and function  . RLS (restless legs syndrome)     sleep study in 2010: No apnea; moderate periodic movements  . GERD (gastroesophageal reflux disease)     H/o upper GI bleed  . GI bleeding   . Obese   . Gout   . Hypothyroidism   . Low back pain   . Cellulitis     Neck  . Diabetes mellitus 1998    A1c-7.1 in 2010, 6.7 in 2013  . Hyperlipidemia     Fasting lipids 2013:158, 152, 35, 93  . Hypertension 2002  . Anemia   . Hyperkalemia   . Irregular heart beat   . PONV (postoperative nausea and vomiting)   . Myocardial infarction   . CAD (coronary artery disease)     dr. Lattie Haw ([facility])  . Depression     Insomnia  . Headache(784.0)   . Vitiligo   . H/O hiatal hernia   . Arthritis   . Cancer     HX SKIN CANCER  . Anginal pain     last cp few months ago.  Marland Kitchen Dysrhythmia     "skips a beat"  . COPD (chronic obstructive pulmonary disease)   . Chronic kidney disease, stage III (moderate)     Creat-1.9 in 04/2008, 2.65 in 02/2010(noHD  . Renal insufficiency   . Neuropathy    Past Surgical History  Procedure Laterality Date  .  Appendectomy    . Carpal tunnel release  1992, 1993, 2011    Right in 2011; previously bilateral  . Tubal ligation  1976  . Partial hysterectomy  1985    Vaginal  . Shoulder surgery  1997    Left  . Incise and drain abcess  2010    Neck-MRSA  . Colonoscopy  02/2002, 07/2009    few diverticula, otherwise negative  . Foot surgery  1984    Bilateral heel spurs  . Abdominal hysterectomy    . Shoulder surgery      RIGHT  ADJUSTED   . Av fistula placement Left 07/16/2012    Procedure: ARTERIOVENOUS (AV) FISTULA CREATION;  Surgeon: Mal Misty, MD;  Location: [facility];  Service: Vascular;  Laterality: Left;  . Av fistula placement Left 07/29/2012    Procedure: ARTERIOVENOUS (AV) FISTULA CREATION;  Surgeon: Mal Misty, MD;  Location: [facility];  Service: Vascular;  Laterality: Left;  Left Brachial-Cephalic Arteriovenous Fistula Creation  . Colonoscopy with esophagogastroduodenoscopy (egd) N/A 08/27/2012    RMR: Normal esophagus s/p 54 F dilator. Small hiatal hernia. Gastric polyp removed, oozing (hyperplastic), which could explain  heme positive stool:Colonic diverticulosis  . Revison of arteriovenous fistula Left 08/03/2013    Procedure: BANDING OF BRACHIOCEPHALIC ARTERIOVENOUS FISTULA;  Surgeon: Conrad Piqua, MD;  Location: [facility];  Service: Vascular;  Laterality: Left;  . Thrombectomy w/ embolectomy Left 08/03/2013    Procedure: THROMBECTOMY OF ARTERIOVENOUS FISTULA  AND REVISION OF  ARTERIOVENOUS FISTULA BANDING.;  Surgeon: Conrad Prince William, MD;  Location: [facility];  Service: Vascular;  Laterality: Left;  . Esophagogastroduodenoscopy N/A 10/27/2013    Dr.Rourk- esophageal mucosa appeared normal. tubular esophagus widely patent throughout its course. stomach empty. patient had longitudinal proximal gastric ersions overlying multiple proximal folds. nodular mucosa with overlying antral erosions as well. no ulcer or infiltrating process seen patent pylorus, normal first and second portion of the duodenum. bx=  reactive gastropathy  . Savory dilation N/A 10/27/2013    Procedure: SAVORY DILATION;  Surgeon: Daneil Dolin, MD;  [facility];  Service: Endoscopy;  Laterality: N/A;  Venia Minks dilation N/A 10/27/2013    Procedure: Venia Minks DILATION;  Surgeon: Daneil Dolin, MD;  [facility];  Service: Endoscopy;  Laterality: N/A;  . Shuntogram Left 08/01/2013    Procedure: FISTULOGRAM;  Surgeon: Conrad La Junta, MD;  Location: [facility] C[facility];  Service: Cardiovascular;  Laterality: Left;   Family History  Problem Relation Age of Onset  . Emphysema Mother   . COPD Mother   . Colon cancer Father   . Diabetes Father   . Cancer Father     colon  . Hypertension Father   . Congestive Heart Failure Maternal Grandmother   . Cancer Paternal Grandmother   . Rheum arthritis Sister   . Cancer Brother 65    colon CA   History  Substance Use Topics  . Smoking status: Never Smoker   . Smokeless tobacco: Never Used  . Alcohol Use: No   OB History    No data available     Review of Systems  All other systems reviewed and are negative.     Allergies  Latex; Vancomycin; Vancomycin hcl; and Quinapril hcl  Home Medications   Prior to Admission medications   Medication Sig Start Date End Date Taking? Authorizing Provider  albuterol (PROAIR HFA) 108 (90 BASE) MCG/ACT inhaler Inhale 2 puffs into the lungs every 6 (six) hours as needed for wheezing or shortness of breath. 07/04/13   Vernie Shanks, MD  allopurinol (ZYLOPRIM) 100 MG tablet Take 1 tablet (100 mg total) by mouth every morning. 07/04/13   Vernie Shanks, MD  amLODipine (NORVASC) 5 MG tablet Take 5 mg by mouth every evening. 07/04/13   Vernie Shanks, MD  carvedilol (COREG) 25 MG tablet Take 1 tablet (25 mg total) by mouth 2 (two) times daily with a meal. 07/04/13   Vernie Shanks, MD  cephALEXin (KEFLEX) 500 MG capsule Take 500 mg by mouth 3 (three) times daily. 10 day course starting on 03/02/2014 03/02/14   Historical Provider, MD   citalopram (CELEXA) 40 MG tablet Take 1 tablet (40 mg total) by mouth daily. 12/01/13   Sharion Balloon, FNP  clobetasol (TEMOVATE) 0.05 % external solution Apply 1 application topically daily as needed. Applied to scalp for psoriasis 12/20/12   Historical Provider, MD  ferrous sulfate 325 (65 FE) MG tablet Take 1 tablet (325 mg total) by mouth every evening. 01/06/14   Tammy Eckard, PHARMD  Fluticasone Furoate-Vilanterol (BREO ELLIPTA) 100-25 MCG/INH AEPB Inhale 1 puff into the lungs 2 (two) times  daily.    Historical Provider, MD  gabapentin (NEURONTIN) 800 MG tablet Take 1,600 mg by mouth at bedtime.     Historical Provider, MD  gabapentin (NEURONTIN) 800 MG tablet Take 1 tablet (800 mg total) by mouth 3 (three) times daily. 02/14/14   Mary-Margaret Hassell Done, FNP  HYDROcodone-acetaminophen (NORCO) 10-325 MG per tablet Take 1 tablet by mouth every 6 (six) hours as needed. 02/13/14   Wardell Honour, MD  hydrOXYzine (ATARAX/VISTARIL) 25 MG tablet Take 1 tablet (25 mg total) by mouth 3 (three) times daily as needed. 02/27/14   Lysbeth Penner, FNP  insulin aspart (NOVOLOG FLEXPEN) 100 UNIT/ML FlexPen Inject 45 Units into the skin 3 (three) times daily with meals. 01/06/14   Tammy Eckard, PHARMD  Insulin Glargine (LANTUS SOLOSTAR) 100 UNIT/ML Solostar Pen Inject 65 units SQ prior to breakfast and 40 units prior to evening meal 01/06/14   Tammy Eckard, PHARMD  levothyroxine (SYNTHROID, LEVOTHROID) 50 MCG tablet Take 1 tablet (50 mcg total) by mouth every evening. 12/01/13   Sharion Balloon, FNP  linagliptin (TRADJENTA) 5 MG TABS tablet Take 1 tablet (5 mg total) by mouth daily. 11/01/13   Sharion Balloon, FNP  losartan (COZAAR) 50 MG tablet Take 50 mg by mouth daily.  06/13/13   Historical Provider, MD  methylPREDNIsolone (MEDROL DOSPACK) 4 MG tablet follow package directions 02/10/14   Lysbeth Penner, FNP  metoCLOPramide (REGLAN) 5 MG tablet Take 1 tablet (5 mg total) by mouth every 8 (eight) hours as  needed for nausea. 10/25/13   Shanker Kristeen Mans, MD  nitroGLYCERIN (NITROSTAT) 0.4 MG SL tablet Place 1 tablet (0.4 mg total) under the tongue every 5 (five) minutes as needed for chest pain. 09/30/13   Wardell Honour, MD  omeprazole (PRILOSEC) 20 MG capsule Take 20 mg by mouth daily.    Historical Provider, MD  permethrin (ELIMITE) 5 % cream Apply 1 application topically once. Apply as directed neck down. Leave on overnight. Wash off in morning. Repeat in 7 days 02/24/14   Marline Backbone, PA-C  potassium chloride (K-DUR,KLOR-CON) 10 MEQ tablet Take 1 tablet (10 mEq total) by mouth 2 (two) times daily. 09/30/13   Wardell Honour, MD  pravastatin (PRAVACHOL) 40 MG tablet Take 1 tablet (40 mg total) by mouth at bedtime. 09/30/13   Wardell Honour, MD  predniSONE (DELTASONE) 20 MG tablet Take 3 po qd x 2 days then 2 po qd x 2 days then 1  Po qd x 2 days then stop 02/27/14   Lysbeth Penner, FNP  rOPINIRole (REQUIP) 1 MG tablet Take 1 tablet (1 mg total) by mouth at bedtime. 07/04/13   Vernie Shanks, MD  sodium bicarbonate 650 MG tablet Take 650 mg by mouth 3 (three) times daily.     Historical Provider, MD  tiotropium (SPIRIVA HANDIHALER) 18 MCG inhalation capsule Place 1 capsule (18 mcg total) into inhaler and inhale every morning. 07/04/13   Vernie Shanks, MD  torsemide (DEMADEX) 20 MG tablet Take 80 mg by mouth daily.    Historical Provider, MD  triamcinolone cream (KENALOG) 0.1 % Apply 1 application topically 2 (two) times daily. 02/24/14   Tiffany Gann, PA-C   BP 131/69 mmHg  Pulse 76  Temp(Src) 99 F (37.2 C) (Oral)  Resp 16  Ht 5\' 7"  (1.702 m)  Wt 225 lb (102.059 kg)  BMI 35.23 kg/m2  SpO2 95% Physical Exam  Constitutional: She is oriented to person, place, and time. She  appears well-developed and well-nourished. No distress.  HENT:  Head: Normocephalic and atraumatic.  Eyes: EOM are normal.  Neck: Normal range of motion.  Cardiovascular: Normal rate, regular rhythm and normal heart  sounds.   Pulmonary/Chest: Effort normal and breath sounds normal.  Abdominal: Soft. She exhibits no distension. There is no tenderness.  Musculoskeletal: Normal range of motion.  Neurological: She is alert and oriented to person, place, and time.  Skin: Skin is warm and dry.  Nonspecific papular rash throughout her arms and legs without secondary signs of infection.  No vesicles noted.  No surrounding erythema.  Psychiatric: She has a normal mood and affect. Judgment normal.  Nursing note and vitals reviewed.   ED Course  Procedures (including critical care time) Labs Review Labs Reviewed  COMPREHENSIVE METABOLIC PANEL - Abnormal; Notable for the following:    Sodium 127 (*)    Chloride 84 (*)    Glucose, Bld 735 (*)    BUN 47 (*)    Creatinine, Ser 2.12 (*)    Albumin 3.3 (*)    Alkaline Phosphatase 172 (*)    GFR calc non Af Amer 23 (*)    GFR calc Af Amer 27 (*)    Anion gap 19 (*)    All other components within normal limits  URINALYSIS, ROUTINE W REFLEX MICROSCOPIC - Abnormal; Notable for the following:    Specific Gravity, Urine <1.005 (*)    Glucose, UA >1000 (*)    Hgb urine dipstick TRACE (*)    Leukocytes, UA SMALL (*)    All other components within normal limits  URINE MICROSCOPIC-ADD ON - Abnormal; Notable for the following:    Squamous Epithelial / LPF FEW (*)    Bacteria, UA FEW (*)    All other components within normal limits  CBG MONITORING, ED - Abnormal; Notable for the following:    Glucose-Capillary >600 (*)    All other components within normal limits  CBG MONITORING, ED - Abnormal; Notable for the following:    Glucose-Capillary 393 (*)    All other components within normal limits  CBG MONITORING, ED - Abnormal; Notable for the following:    Glucose-Capillary 258 (*)    All other components within normal limits  CBC WITH DIFFERENTIAL    Imaging Review No results found.   EKG Interpretation None      MDM   Final diagnoses:   Hyperglycemia  Rash  Dehydration    Hyperglycemia.  Likely secondary to steroid use last week.  No signs of DKA.  Patient feels much better after IV fluids.  Dermatology follow-up for her ongoing skin eruption that is very nonspecific.  No signs of infection at this time.    Hoy Morn, MD 03/04/14 1800

## 2014-03-04 NOTE — ED Notes (Signed)
CRITICAL VALUE ALERT  Critical value received:  Glucose  Date of notification:  03/04/2014  Time of notification:  [id number]  Critical value read back:Yes.    Nurse who received alert:  Domenica Reamer RN   MD notified (1st page):  Dr Venora Maples  Time of first page:  [phone]  MD notified (2nd page):  Time of second page:  Responding MD:  Dr Venora Maples  Time MD responded:  [phone]

## 2014-03-10 ENCOUNTER — Ambulatory Visit: Payer: Medicare HMO | Admitting: Family Medicine

## 2014-03-14 ENCOUNTER — Telehealth: Payer: Self-pay | Admitting: Family Medicine

## 2014-03-14 ENCOUNTER — Other Ambulatory Visit: Payer: Self-pay | Admitting: Family

## 2014-03-14 ENCOUNTER — Other Ambulatory Visit: Payer: Self-pay | Admitting: Nurse Practitioner

## 2014-03-14 LAB — HM DIABETES EYE EXAM

## 2014-03-15 ENCOUNTER — Other Ambulatory Visit: Payer: Self-pay | Admitting: Family

## 2014-03-15 ENCOUNTER — Other Ambulatory Visit: Payer: Self-pay | Admitting: *Deleted

## 2014-03-15 ENCOUNTER — Telehealth: Payer: Self-pay | Admitting: Family Medicine

## 2014-03-15 MED ORDER — AMLODIPINE BESYLATE 5 MG PO TABS: 5.0000 mg | ORAL_TABLET | Freq: Every evening | ORAL | Status: DC

## 2014-03-15 NOTE — Telephone Encounter (Signed)
Phone call from Dr. Redmond Pulling who has seen this lady's dermatitis. 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 If that is the case that she will need to be treated with steroids which will affect her diabetes.  Historically she was treated in some emergency room while on prednisone and her blood sugar was said to be in the 700 range thus the cause for concern with steroids and diabetes. I think we should enlist the help of her pharmacology consultants going forward.

## 2014-03-15 NOTE — Telephone Encounter (Signed)
Has follow up appointment with Dr. Sabra Heck 05/22/13.

## 2014-03-15 NOTE — Telephone Encounter (Signed)
Pt has follow up appointment with Dr. Sabra Heck 05/22/13

## 2014-03-22 ENCOUNTER — Encounter: Payer: Self-pay | Admitting: *Deleted

## 2014-03-30 ENCOUNTER — Encounter: Payer: Self-pay | Admitting: Nurse Practitioner

## 2014-03-30 ENCOUNTER — Ambulatory Visit (INDEPENDENT_AMBULATORY_CARE_PROVIDER_SITE_OTHER): Payer: Commercial Managed Care - HMO | Admitting: Nurse Practitioner

## 2014-03-30 VITALS — BP 145/70 | HR 70 | Temp 98.7°F | Ht 67.0 in | Wt 239.0 lb

## 2014-03-30 DIAGNOSIS — J411 Mucopurulent chronic bronchitis: Secondary | ICD-10-CM | POA: Insufficient documentation

## 2014-03-30 DIAGNOSIS — N184 Chronic kidney disease, stage 4 (severe): Secondary | ICD-10-CM

## 2014-03-30 DIAGNOSIS — E038 Other specified hypothyroidism: Secondary | ICD-10-CM

## 2014-03-30 DIAGNOSIS — F32A Depression, unspecified: Secondary | ICD-10-CM

## 2014-03-30 DIAGNOSIS — E1140 Type 2 diabetes mellitus with diabetic neuropathy, unspecified: Secondary | ICD-10-CM

## 2014-03-30 DIAGNOSIS — F329 Major depressive disorder, single episode, unspecified: Secondary | ICD-10-CM

## 2014-03-30 DIAGNOSIS — IMO0002 Reserved for concepts with insufficient information to code with codable children: Secondary | ICD-10-CM

## 2014-03-30 DIAGNOSIS — G2581 Restless legs syndrome: Secondary | ICD-10-CM

## 2014-03-30 DIAGNOSIS — E1169 Type 2 diabetes mellitus with other specified complication: Secondary | ICD-10-CM

## 2014-03-30 DIAGNOSIS — K219 Gastro-esophageal reflux disease without esophagitis: Secondary | ICD-10-CM

## 2014-03-30 DIAGNOSIS — Z23 Encounter for immunization: Secondary | ICD-10-CM

## 2014-03-30 DIAGNOSIS — I10 Essential (primary) hypertension: Secondary | ICD-10-CM

## 2014-03-30 DIAGNOSIS — E1165 Type 2 diabetes mellitus with hyperglycemia: Secondary | ICD-10-CM

## 2014-03-30 DIAGNOSIS — E669 Obesity, unspecified: Secondary | ICD-10-CM

## 2014-03-30 DIAGNOSIS — E785 Hyperlipidemia, unspecified: Secondary | ICD-10-CM

## 2014-03-30 DIAGNOSIS — L409 Psoriasis, unspecified: Secondary | ICD-10-CM

## 2014-03-30 DIAGNOSIS — E876 Hypokalemia: Secondary | ICD-10-CM

## 2014-03-30 DIAGNOSIS — E034 Atrophy of thyroid (acquired): Secondary | ICD-10-CM

## 2014-03-30 LAB — POCT GLYCOSYLATED HEMOGLOBIN (HGB A1C): HEMOGLOBIN A1C: 9.8

## 2014-03-30 MED ORDER — CITALOPRAM HYDROBROMIDE 40 MG PO TABS: 40.0000 mg | ORAL_TABLET | Freq: Every day | ORAL | Status: DC

## 2014-03-30 MED ORDER — TIOTROPIUM BROMIDE MONOHYDRATE 18 MCG IN CAPS: 18.0000 ug | ORAL_CAPSULE | Freq: Every morning | RESPIRATORY_TRACT | Status: DC

## 2014-03-30 MED ORDER — HYDROCODONE-ACETAMINOPHEN 10-325 MG PO TABS: 1.0000 | ORAL_TABLET | Freq: Four times a day (QID) | ORAL | Status: DC | PRN

## 2014-03-30 MED ORDER — LINAGLIPTIN 5 MG PO TABS: 5.0000 mg | ORAL_TABLET | Freq: Every day | ORAL | Status: DC

## 2014-03-30 MED ORDER — SODIUM BICARBONATE 650 MG PO TABS: 650.0000 mg | ORAL_TABLET | Freq: Three times a day (TID) | ORAL | Status: DC

## 2014-03-30 MED ORDER — LEVOTHYROXINE SODIUM 50 MCG PO TABS: 50.0000 ug | ORAL_TABLET | Freq: Every evening | ORAL | Status: DC

## 2014-03-30 MED ORDER — POTASSIUM CHLORIDE CRYS ER 10 MEQ PO TBCR: 10.0000 meq | EXTENDED_RELEASE_TABLET | Freq: Two times a day (BID) | ORAL | Status: DC

## 2014-03-30 MED ORDER — OMEPRAZOLE 20 MG PO CPDR: 20.0000 mg | DELAYED_RELEASE_CAPSULE | Freq: Every day | ORAL | Status: DC

## 2014-03-30 NOTE — Addendum Note (Signed)
Addended by: Rolena Infante on: 03/30/2014 04:18 PM   Modules accepted: Orders

## 2014-03-30 NOTE — Progress Notes (Signed)
Subjective:    Patient ID: [name], female    DOB: [birth year], 64 y.o.   MRN: [id number]  HPI Patient in today for follow up of chronic medical problems. She has a long list of problems as well as a long list of medications. She is doing well today with only c/o skin disease on bil arms. She was put on high dose of prednisone by dermatologist and told her to follow up with PCP. She says that she is no longer itching and he blisters are drying up. She is a diabetic and blood sugars have increased some since starting prednisone. She says that her blood sugar as been fluctuating with a few readings over 200.   Patient Active Problem List   Diagnosis Date Noted  . Dyslipidemia associated with type 2 diabetes mellitus 12/01/2013  . Hypoglycemia 10/26/2013  . Hyperglycemia 10/22/2013  . Cholecystitis 10/22/2013  . Sensation of cold in finger-Left 09/12/2013  . Pain of left hand 09/12/2013  . Numbness of left hand 07/29/2013  . Cramping of hands 07/29/2013  . 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 End stage renal disease 12/13/2012  . Encounter for routine gynecological examination 09/30/2012  . 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 Heme positive stool 08/18/2012  . Psoriasis 07/26/2012  . DM type 2, uncontrolled, with neuropathy 04/29/2012  . Chest pain   . RLS (restless legs syndrome)   . Chronic kidney disease, stage IV (severe)   . Gout 11/22/2009  . Anemia, normocytic normochromic 07/24/2009  . Hypothyroidism 01/22/2009  . Hyperlipidemia 01/22/2009  . Obesity 01/22/2009  . Hypertension 01/22/2009  . Gastroesophageal reflux disease 01/22/2009   Outpatient Encounter Prescriptions as of 03/30/2014  Medication Sig  . albuterol (PROAIR HFA) 108 (90 BASE) MCG/ACT inhaler Inhale 2 puffs into the lungs every 6 (six) hours as needed for wheezing or shortness of breath.  . allopurinol (ZYLOPRIM) 100 MG tablet Take 1 tablet (100 mg total) by mouth every  morning.  Marland Kitchen amLODipine (NORVASC) 5 MG tablet Take 1 tablet (5 mg total) by mouth every evening.  . carvedilol (COREG) 25 MG tablet Take 1 tablet (25 mg total) by mouth 2 (two) times daily with a meal.  . citalopram (CELEXA) 40 MG tablet TAKE ONE TABLET BY MOUTH ONCE DAILY  . clobetasol (TEMOVATE) 0.05 % external solution Apply 1 application topically daily as needed. Applied to scalp for psoriasis  . ferrous sulfate 325 (65 FE) MG tablet TAKE ONE TABLET BY MOUTH ONCE DAILY WITH BREAKFAST  . Fluticasone Furoate-Vilanterol (BREO ELLIPTA) 100-25 MCG/INH AEPB Inhale 1 puff into the lungs 2 (two) times daily.  Marland Kitchen gabapentin (NEURONTIN) 800 MG tablet TAKE ONE TABLET BY MOUTH THREE TIMES DAILY  . HYDROcodone-acetaminophen (NORCO) 10-325 MG per tablet Take 1 tablet by mouth every 6 (six) hours as needed.  . insulin aspart (NOVOLOG FLEXPEN) 100 UNIT/ML FlexPen Inject 45 Units into the skin 3 (three) times daily with meals.  . Insulin Glargine (LANTUS SOLOSTAR) 100 UNIT/ML Solostar Pen Inject 65 units SQ prior to breakfast and 40 units prior to evening meal  . levothyroxine (SYNTHROID, LEVOTHROID) 50 MCG tablet Take 1 tablet (50 mcg total) by mouth every evening.  . linagliptin (TRADJENTA) 5 MG TABS tablet Take 1 tablet (5 mg total) by mouth daily.  Marland Kitchen losartan (COZAAR) 50 MG tablet Take 50 mg by mouth daily.   . metoCLOPramide (REGLAN) 5 MG tablet Take 1 tablet (5 mg total) by mouth every 8 (  eight) hours as needed for nausea.  . nitroGLYCERIN (NITROSTAT) 0.4 MG SL tablet Place 1 tablet (0.4 mg total) under the tongue every 5 (five) minutes as needed for chest pain.  Marland Kitchen omeprazole (PRILOSEC) 20 MG capsule Take 20 mg by mouth daily.  . potassium chloride (K-DUR,KLOR-CON) 10 MEQ tablet Take 1 tablet (10 mEq total) by mouth 2 (two) times daily.  . pravastatin (PRAVACHOL) 40 MG tablet Take 1 tablet (40 mg total) by mouth at bedtime.  . predniSONE (DELTASONE) 20 MG tablet Take 3 po qd x 2 days then 2 po qd x 2 days  then 1  Po qd x 2 days then stop  . rOPINIRole (REQUIP) 1 MG tablet Take 1 tablet (1 mg total) by mouth at bedtime.  . sodium bicarbonate 650 MG tablet Take 650 mg by mouth 3 (three) times daily.   Marland Kitchen tiotropium (SPIRIVA HANDIHALER) 18 MCG inhalation capsule Place 1 capsule (18 mcg total) into inhaler and inhale every morning.  . torsemide (DEMADEX) 20 MG tablet Take 80 mg by mouth daily.  . [DISCONTINUED] cephALEXin (KEFLEX) 500 MG capsule Take 500 mg by mouth 3 (three) times daily. 10 day course starting on 03/02/2014  . [DISCONTINUED] hydrOXYzine (ATARAX/VISTARIL) 25 MG tablet Take 1 tablet (25 mg total) by mouth 3 (three) times daily as needed.  . [DISCONTINUED] methylPREDNIsolone (MEDROL DOSPACK) 4 MG tablet follow package directions  . [DISCONTINUED] permethrin (ELIMITE) 5 % cream Apply 1 application topically once. Apply as directed neck down. Leave on overnight. Wash off in morning. Repeat in 7 days  . [DISCONTINUED] triamcinolone cream (KENALOG) 0.1 % Apply 1 application topically 2 (two) times daily.       Review of Systems  Constitutional: Negative.   HENT: Negative.   Respiratory: Negative.   Cardiovascular: Negative.   Genitourinary: Negative.   Neurological: Negative.   Psychiatric/Behavioral: Negative.   All other systems reviewed and are negative.      Objective:   Physical Exam  Constitutional: She is oriented to person, place, and time. She appears well-developed and well-nourished.  HENT:  Nose: Nose normal.  Mouth/Throat: Oropharynx is clear and moist.  Eyes: EOM are normal.  Neck: Trachea normal, normal range of motion and full passive range of motion without pain. Neck supple. No JVD present. Carotid bruit is not present. No thyromegaly present.  Cardiovascular: Normal rate, regular rhythm, normal heart sounds and intact distal pulses.  Exam reveals no gallop and no friction rub.   No murmur heard. Pulmonary/Chest: Effort normal and breath sounds normal.    Abdominal: Soft. Bowel sounds are normal. She exhibits no distension and no mass. There is no tenderness.  Musculoskeletal: Normal range of motion.  Lymphadenopathy:    She has no cervical adenopathy.  Neurological: She is alert and oriented to person, place, and time. She has normal reflexes.  Skin: Skin is warm and dry.  Multiple scabbed over skin lesions on bil forearms  Psychiatric: She has a normal mood and affect. Her behavior is normal. Judgment and thought content normal.   BP 145/70 mmHg  Pulse 70  Temp(Src) 98.7 F (37.1 C) (Oral)  Ht 5' 7"  (1.702 m)  Wt 239 lb (108.41 kg)  BMI 37.42 kg/m2  Results for orders placed or performed in visit on 03/30/14  POCT glycosylated hemoglobin (Hb A1C)  Result Value Ref Range   Hemoglobin A1C 9.8%          Assessment & Plan:   1. Essential hypertension   2. Gastroesophageal  reflux disease without esophagitis   3. DM type 2, uncontrolled, with neuropathy   4. Dyslipidemia associated with type 2 diabetes mellitus   5. Hypothyroidism due to acquired atrophy of thyroid   6. Psoriasis   7. Chronic kidney disease, stage IV (severe)   8. Obesity   9. RLS (restless legs syndrome)   10. Hypokalemia   11. Depression   12. Mucopurulent chronic bronchitis     Orders Placed This Encounter  Procedures  . CMP14+EGFR  . NMR, lipoprofile  . Thyroid Panel With TSH  . POCT glycosylated hemoglobin (Hb A1C)   Meds ordered this encounter  Medications  . levothyroxine (SYNTHROID, LEVOTHROID) 50 MCG tablet    Sig: Take 1 tablet (50 mcg total) by mouth every evening.    Dispense:  90 tablet    Refill:  1    Order Specific Question:  Supervising Provider    Answer:  Chipper Herb [[id number]]  . potassium chloride (K-DUR,KLOR-CON) 10 MEQ tablet    Sig: Take 1 tablet (10 mEq total) by mouth 2 (two) times daily.    Dispense:  90 tablet    Refill:  1    Order Specific Question:  Supervising Provider    Answer:  Chipper Herb [[id number]]  .  sodium bicarbonate 650 MG tablet    Sig: Take 1 tablet (650 mg total) by mouth 3 (three) times daily.    Dispense:  270 tablet    Refill:  1    Order Specific Question:  Supervising Provider    Answer:  Chipper Herb [[id number]]  . omeprazole (PRILOSEC) 20 MG capsule    Sig: Take 1 capsule (20 mg total) by mouth daily.    Dispense:  90 capsule    Refill:  1    Order Specific Question:  Supervising Provider    Answer:  Chipper Herb [[id number]]  . linagliptin (TRADJENTA) 5 MG TABS tablet    Sig: Take 1 tablet (5 mg total) by mouth daily.    Dispense:  90 tablet    Refill:  1    Order Specific Question:  Supervising Provider    Answer:  Chipper Herb [[id number]]  . citalopram (CELEXA) 40 MG tablet    Sig: Take 1 tablet (40 mg total) by mouth daily.    Dispense:  90 tablet    Refill:  1    Order Specific Question:  Supervising Provider    Answer:  Chipper Herb [[id number]]  . tiotropium (SPIRIVA HANDIHALER) 18 MCG inhalation capsule    Sig: Place 1 capsule (18 mcg total) into inhaler and inhale every morning.    Dispense:  90 capsule    Refill:  1    Order Specific Question:  Supervising Provider    Answer:  Chipper Herb [[id number]]  . HYDROcodone-acetaminophen (NORCO) 10-325 MG per tablet    Sig: Take 1 tablet by mouth every 6 (six) hours as needed.    Dispense:  30 tablet    Refill:  0    Order Specific Question:  Supervising Provider    Answer:  Chipper Herb [[id number]]    hgba1c was 11.9 in august- improving - no med changes today and will recheck in 3 months zostavax today Health maintenance reviewed Labs pending Finish prednisone rx by dermatilogist Continue to keep close check on blood sugars Diet and exercise encouraged RTO in 3 months follow up  [place], FNP

## 2014-03-30 NOTE — Patient Instructions (Signed)
Diabetes and Foot Care Diabetes may cause you to have problems because of poor blood supply (circulation) to your feet and legs. This may cause the skin on your feet to become thinner, break easier, and heal more slowly. Your skin may become dry, and the skin may peel and crack. You may also have nerve damage in your legs and feet causing decreased feeling in them. You may not notice minor injuries to your feet that could lead to infections or more serious problems. Taking care of your feet is one of the most important things you can do for yourself.  HOME CARE INSTRUCTIONS  Wear shoes at all times, even in the house. Do not go barefoot. Bare feet are easily injured.  Check your feet daily for blisters, cuts, and redness. If you cannot see the bottom of your feet, use a mirror or ask someone for help.  Wash your feet with warm water (do not use hot water) and mild soap. Then pat your feet and the areas between your toes until they are completely dry. Do not soak your feet as this can dry your skin.  Apply a moisturizing lotion or petroleum jelly (that does not contain alcohol and is unscented) to the skin on your feet and to dry, brittle toenails. Do not apply lotion between your toes.  Trim your toenails straight across. Do not dig under them or around the cuticle. File the edges of your nails with an emery board or nail file.  Do not cut corns or calluses or try to remove them with medicine.  Wear clean socks or stockings every day. Make sure they are not too tight. Do not wear knee-high stockings since they may decrease blood flow to your legs.  Wear shoes that fit properly and have enough cushioning. To break in new shoes, wear them for just a few hours a day. This prevents you from injuring your feet. Always look in your shoes before you put them on to be sure there are no objects inside.  Do not cross your legs. This may decrease the blood flow to your feet.  If you find a minor scrape,  cut, or break in the skin on your feet, keep it and the skin around it clean and dry. These areas may be cleansed with mild soap and water. Do not cleanse the area with peroxide, alcohol, or iodine.  When you remove an adhesive bandage, be sure not to damage the skin around it.  If you have a wound, look at it several times a day to make sure it is healing.  Do not use heating pads or hot water bottles. They may burn your skin. If you have lost feeling in your feet or legs, you may not know it is happening until it is too late.  Make sure your health care provider performs a complete foot exam at least annually or more often if you have foot problems. Report any cuts, sores, or bruises to your health care provider immediately. SEEK MEDICAL CARE IF:   You have an injury that is not healing.  You have cuts or breaks in the skin.  You have an ingrown nail.  You notice redness on your legs or feet.  You feel burning or tingling in your legs or feet.  You have pain or cramps in your legs and feet.  Your legs or feet are numb.  Your feet always feel cold. SEEK IMMEDIATE MEDICAL CARE IF:   There is increasing redness,   swelling, or pain in or around a wound.  There is a red line that goes up your leg.  Pus is coming from a wound.  You develop a fever or as directed by your health care provider.  You notice a bad smell coming from an ulcer or wound. Document Released: 03/07/2000 Document Revised: 11/10/2012 Document Reviewed: 08/17/2012 Exit[employer] Patient Information 2015 [employer]. This information is not intended to replace advice given to you by your health care provider. Make sure you discuss any questions you have with your health care provider.  

## 2014-03-31 LAB — NMR, LIPOPROFILE
CHOLESTEROL: 222 mg/dL — AB (ref 100–199)
HDL CHOLESTEROL BY NMR: 59 mg/dL (ref >39)
HDL PARTICLE NUMBER: 39.4 umol/L (ref >=30.5)
LDL Particle Number: 1166 nmol/L — ABNORMAL HIGH (ref <1000)
LDL Size: 20.9 nm (ref >20.5)
LDL-C: 115 mg/dL — ABNORMAL HIGH (ref 0–99)
LP-IR Score: 65 — ABNORMAL HIGH (ref <=45)
Small LDL Particle Number: 455 nmol/L (ref <=527)
Triglycerides by NMR: 241 mg/dL — ABNORMAL HIGH (ref 0–149)

## 2014-03-31 LAB — THYROID PANEL WITH TSH
Free Thyroxine Index: 1.6 (ref 1.2–4.9)
T3 Uptake Ratio: 33 % (ref 24–39)
T4, Total: 4.9 ug/dL (ref 4.5–12.0)
TSH: 0.909 u[IU]/mL (ref 0.450–4.500)

## 2014-03-31 LAB — CMP14+EGFR
ALBUMIN: 3.6 g/dL (ref 3.6–4.8)
ALK PHOS: 127 IU/L — AB (ref 39–117)
ALT: 19 IU/L (ref 0–32)
AST: 11 IU/L (ref 0–40)
Albumin/Globulin Ratio: 1.2 (ref 1.1–2.5)
BUN / CREAT RATIO: 25 (ref 11–26)
BUN: 50 mg/dL — ABNORMAL HIGH (ref 8–27)
CO2: 24 mmol/L (ref 18–29)
Calcium: 8.8 mg/dL (ref 8.7–10.3)
Chloride: 94 mmol/L — ABNORMAL LOW (ref 97–108)
Creatinine, Ser: 2.02 mg/dL — ABNORMAL HIGH (ref 0.57–1.00)
GFR calc Af Amer: 29 mL/min/{1.73_m2} — ABNORMAL LOW (ref >59)
GFR calc non Af Amer: 26 mL/min/{1.73_m2} — ABNORMAL LOW (ref >59)
Globulin, Total: 3 g/dL (ref 1.5–4.5)
Glucose: 218 mg/dL — ABNORMAL HIGH (ref 65–99)
Potassium: 4 mmol/L (ref 3.5–5.2)
Sodium: 139 mmol/L (ref 134–144)
Total Bilirubin: <0.2 mg/dL (ref 0.0–1.2)
Total Protein: 6.6 g/dL (ref 6.0–8.5)

## 2014-04-27 ENCOUNTER — Telehealth: Payer: Self-pay | Admitting: Family Medicine

## 2014-04-27 NOTE — Telephone Encounter (Signed)
Appointment given for 2/10 with Sabra Heck

## 2014-04-27 NOTE — Telephone Encounter (Signed)
Patient called and said she would like to see Dr. Sabra Heck about her back pain before Feb.14, 2016

## 2014-04-29 ENCOUNTER — Other Ambulatory Visit: Payer: Self-pay | Admitting: Family Medicine

## 2014-05-03 ENCOUNTER — Ambulatory Visit (INDEPENDENT_AMBULATORY_CARE_PROVIDER_SITE_OTHER): Payer: Commercial Managed Care - HMO | Admitting: Family Medicine

## 2014-05-03 ENCOUNTER — Encounter: Payer: Self-pay | Admitting: Family Medicine

## 2014-05-03 ENCOUNTER — Ambulatory Visit (INDEPENDENT_AMBULATORY_CARE_PROVIDER_SITE_OTHER): Payer: Commercial Managed Care - HMO

## 2014-05-03 VITALS — BP 114/63 | HR 76 | Temp 98.7°F | Ht 67.0 in | Wt 239.0 lb

## 2014-05-03 DIAGNOSIS — M5442 Lumbago with sciatica, left side: Secondary | ICD-10-CM

## 2014-05-03 MED ORDER — HYDROCODONE-ACETAMINOPHEN 10-325 MG PO TABS: 1.0000 | ORAL_TABLET | Freq: Four times a day (QID) | ORAL | Status: DC | PRN

## 2014-05-03 MED ORDER — GLUCOSE BLOOD VI STRP: 1.0000 | ORAL_STRIP | Freq: Four times a day (QID) | Status: DC

## 2014-05-03 MED ORDER — MUPIROCIN 2 % EX OINT: 1.0000 "application " | TOPICAL_OINTMENT | Freq: Two times a day (BID) | CUTANEOUS | Status: DC

## 2014-05-03 MED ORDER — CYCLOBENZAPRINE HCL 10 MG PO TABS: 10.0000 mg | ORAL_TABLET | Freq: Three times a day (TID) | ORAL | Status: DC | PRN

## 2014-05-03 NOTE — Patient Instructions (Signed)

## 2014-05-03 NOTE — Progress Notes (Signed)
Subjective:    Patient ID: [name], female    DOB: 1951/03/03, 64 y.o.   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 The dermatologist had called to ask [place] to help manage her diabetes given use of steroids. So far diabetes has done fairly well. She says that she is trying to watch her diet and her insulin a little closer.  Today she is complaining of pain across her lower back. There is no history of lifting bending or fall. Pain does not radiate to her legs. She tells me that she has a history of degenerative disc disease. She generally takes hydrocodone for pain in her foot as well as gabapentin for diabetic neuropathy  Patient Active Problem List   Diagnosis Date Noted  . Mucopurulent chronic bronchitis 03/30/2014  . Hypokalemia 03/30/2014  . Dyslipidemia associated with type 2 diabetes mellitus 12/01/2013  . Hypoglycemia 10/26/2013  . Hyperglycemia 10/22/2013  . Cholecystitis 10/22/2013  . Sensation of cold in finger-Left 09/12/2013  . Pain of left hand 09/12/2013  . Numbness of left hand 07/29/2013  . Cramping of hands 07/29/2013  . 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 End stage renal disease 12/13/2012  . Encounter for routine gynecological examination 09/30/2012  . 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 Heme positive stool 08/18/2012  . Psoriasis 07/26/2012  . DM type 2, uncontrolled, with neuropathy 04/29/2012  . Chest pain   . RLS (restless legs syndrome)   . Chronic kidney disease, stage IV (severe)   . Gout 11/22/2009  . Anemia, normocytic normochromic 07/24/2009  . Hypothyroidism 01/22/2009  . Hyperlipidemia 01/22/2009  . Obesity 01/22/2009  . Hypertension 01/22/2009  . Gastroesophageal reflux disease 01/22/2009   Outpatient Encounter Prescriptions as of 05/03/2014  Medication Sig  . albuterol (PROAIR HFA) 108 (90 BASE) MCG/ACT inhaler Inhale 2 puffs  into the lungs every 6 (six) hours as needed for wheezing or shortness of breath.  . allopurinol (ZYLOPRIM) 100 MG tablet Take 1 tablet (100 mg total) by mouth every morning.  Marland Kitchen amLODipine (NORVASC) 5 MG tablet TAKE ONE TABLET BY MOUTH ONCE DAILY IN THE EVENING  . carvedilol (COREG) 25 MG tablet Take 1 tablet (25 mg total) by mouth 2 (two) times daily with a meal. (Patient taking differently: Take 25 mg by mouth daily. )  . citalopram (CELEXA) 40 MG tablet Take 1 tablet (40 mg total) by mouth daily.  . ferrous sulfate 325 (65 FE) MG tablet TAKE ONE TABLET BY MOUTH ONCE DAILY WITH BREAKFAST  . Fluticasone Furoate-Vilanterol (BREO ELLIPTA) 100-25 MCG/INH AEPB Inhale 1 puff into the lungs 2 (two) times daily.  Marland Kitchen gabapentin (NEURONTIN) 800 MG tablet TAKE ONE TABLET BY MOUTH THREE TIMES DAILY  . insulin aspart (NOVOLOG FLEXPEN) 100 UNIT/ML FlexPen Inject 45 Units into the skin 3 (three) times daily with meals.  . Insulin Glargine (LANTUS SOLOSTAR) 100 UNIT/ML Solostar Pen Inject 65 units SQ prior to breakfast and 40 units prior to evening meal  . levothyroxine (SYNTHROID, LEVOTHROID) 50 MCG tablet Take 1 tablet (50 mcg total) by mouth every evening.  . linagliptin (TRADJENTA) 5 MG TABS tablet Take 1 tablet (5 mg total) by mouth daily.  Marland Kitchen losartan (COZAAR) 50 MG tablet Take 50 mg by mouth daily.   . nitroGLYCERIN (NITROSTAT) 0.4 MG SL tablet Place 1 tablet (0.4 mg total) under the tongue every 5 (five) minutes as needed for chest pain.  Marland Kitchen  omeprazole (PRILOSEC) 20 MG capsule Take 1 capsule (20 mg total) by mouth daily.  . potassium chloride (K-DUR,KLOR-CON) 10 MEQ tablet Take 1 tablet (10 mEq total) by mouth 2 (two) times daily.  . pravastatin (PRAVACHOL) 40 MG tablet Take 1 tablet (40 mg total) by mouth at bedtime.  . predniSONE (DELTASONE) 20 MG tablet Take 3 po qd x 2 days then 2 po qd x 2 days then 1  Po qd x 2 days then stop (Patient taking differently: Take 60 mg by mouth daily. Take 3 po qd x 2 days  then 2 po qd x 2 days then 1  Po qd x 2 days then stop)  . rOPINIRole (REQUIP) 1 MG tablet Take 1 tablet (1 mg total) by mouth at bedtime.  . sodium bicarbonate 650 MG tablet Take 1 tablet (650 mg total) by mouth 3 (three) times daily.  Marland Kitchen tiotropium (SPIRIVA HANDIHALER) 18 MCG inhalation capsule Place 1 capsule (18 mcg total) into inhaler and inhale every morning.  . torsemide (DEMADEX) 20 MG tablet Take 80 mg by mouth daily.  Marland Kitchen HYDROcodone-acetaminophen (NORCO) 10-325 MG per tablet Take 1 tablet by mouth every 6 (six) hours as needed.  . [DISCONTINUED] clobetasol (TEMOVATE) 0.05 % external solution Apply 1 application topically daily as needed. Applied to scalp for psoriasis  . [DISCONTINUED] metoCLOPramide (REGLAN) 5 MG tablet Take 1 tablet (5 mg total) by mouth every 8 (eight) hours as needed for nausea.    Review of Systems  HENT: Negative.   Respiratory: Negative.   Cardiovascular: Negative.   Gastrointestinal: Negative.   Genitourinary: Negative.   Musculoskeletal: Positive for back pain.  Skin: Positive for rash and wound.  Psychiatric/Behavioral: Negative.        Objective:   Physical Exam  Constitutional: She appears well-developed and well-nourished.  Cardiovascular: Normal rate.   Pulmonary/Chest: Effort normal.  Musculoskeletal:  Back exam: General he has good range of motion with no limitation in forward flexion or lateral bending. No specific tenderness over SI joints. Straight leg raising is negative. Deep tendon reflexes are symmetric.  Skin:  Small dime size area on the left shin with no surrounding erythema looks like it may have started as a blister but she says this will not heal. This may be related to the bullous pemphigus but I will give her some Bactroban to apply after bathing.    BP 114/63 mmHg  Pulse 76  Temp(Src) 98.7 F (37.1 C) (Oral)  Ht 5\' 7"  (1.702 m)  Wt 239 lb (108.41 kg)  BMI 37.42 kg/m2        Assessment & Plan:  1. Midline low back  pain with left-sided sciatica X-ray shows no acute changes. I suspect the pain is more soft tissue which means ligament tendon muscle. She artery takes hydrocodone for foot pain have suggested that she could increase the frequency of this to take along with some Flexeril and do some back exercises. If symptoms do not resolve in 10-14 days consider MRI   Wardell Honour MD - DG Lumbar Spine 2-3 Views; Future

## 2014-05-22 ENCOUNTER — Other Ambulatory Visit: Payer: Self-pay | Admitting: *Deleted

## 2014-05-22 MED ORDER — PRAVASTATIN SODIUM 40 MG PO TABS: 40.0000 mg | ORAL_TABLET | Freq: Every day | ORAL | Status: DC

## 2014-05-23 ENCOUNTER — Ambulatory Visit: Payer: Commercial Managed Care - HMO | Admitting: Family Medicine

## 2014-05-25 ENCOUNTER — Telehealth: Payer: Self-pay | Admitting: Family Medicine

## 2014-05-25 NOTE — Telephone Encounter (Signed)
i realize he will not be here until 05/30/14. Last filled 05/03/14

## 2014-05-26 NOTE — Telephone Encounter (Signed)
Pt aware that it will be 10/30/14 before [name] returns, she wants this to be ready for pick up on this day, also she says he told her to increase dosage?

## 2014-05-30 MED ORDER — HYDROCODONE-ACETAMINOPHEN 10-325 MG PO TABS: 1.0000 | ORAL_TABLET | Freq: Four times a day (QID) | ORAL | Status: DC | PRN

## 2014-05-30 NOTE — Telephone Encounter (Signed)
Patient up rx ready to be picked up

## 2014-06-06 ENCOUNTER — Telehealth: Payer: Self-pay | Admitting: Family Medicine

## 2014-06-06 MED ORDER — INSULIN ASPART 100 UNIT/ML FLEXPEN: 45.0000 [IU] | PEN_INJECTOR | Freq: Three times a day (TID) | SUBCUTANEOUS | Status: DC

## 2014-06-06 NOTE — Telephone Encounter (Signed)
Novolog sent

## 2014-06-20 LAB — HM DIABETES EYE EXAM

## 2014-06-28 ENCOUNTER — Encounter: Payer: Self-pay | Admitting: Family Medicine

## 2014-06-28 ENCOUNTER — Ambulatory Visit (INDEPENDENT_AMBULATORY_CARE_PROVIDER_SITE_OTHER): Payer: Commercial Managed Care - HMO | Admitting: Family Medicine

## 2014-06-28 VITALS — BP 142/73 | HR 92 | Temp 99.3°F | Ht 67.0 in | Wt 249.0 lb

## 2014-06-28 DIAGNOSIS — E1165 Type 2 diabetes mellitus with hyperglycemia: Secondary | ICD-10-CM

## 2014-06-28 DIAGNOSIS — IMO0002 Reserved for concepts with insufficient information to code with codable children: Secondary | ICD-10-CM

## 2014-06-28 DIAGNOSIS — E1140 Type 2 diabetes mellitus with diabetic neuropathy, unspecified: Secondary | ICD-10-CM

## 2014-06-28 DIAGNOSIS — H269 Unspecified cataract: Secondary | ICD-10-CM | POA: Diagnosis not present

## 2014-06-28 DIAGNOSIS — J209 Acute bronchitis, unspecified: Secondary | ICD-10-CM | POA: Diagnosis not present

## 2014-06-28 LAB — POCT GLYCOSYLATED HEMOGLOBIN (HGB A1C): Hemoglobin A1C: 10.3

## 2014-06-28 MED ORDER — DOXYCYCLINE HYCLATE 100 MG PO TABS: 100.0000 mg | ORAL_TABLET | Freq: Two times a day (BID) | ORAL | Status: DC

## 2014-06-28 MED ORDER — HYDROCODONE-ACETAMINOPHEN 10-325 MG PO TABS: 1.0000 | ORAL_TABLET | Freq: Four times a day (QID) | ORAL | Status: DC | PRN

## 2014-06-28 NOTE — Patient Instructions (Signed)

## 2014-06-28 NOTE — Progress Notes (Signed)
Subjective:    Patient ID: [name], female    DOB: [birth year], 64 y.o.   MRN: [id number]  HPI 64 year old female who has been treated with high-dose steroids for bullous pemphigoid by the dermatologist. Since being on steroids for just 3 months she has developed cataract and Cushing syndrome. She more or less stop the steroids on her own. Diabetes is been difficult to manage as well as per expected with steroids. Remains to be seen how her skin does since stopping the prednisone. My impression was she would stay on low dose indefinitely. She has also been taking more hydrocodone for back pain recently.    Review of Systems  Constitutional: Negative.   HENT: Positive for congestion.   Eyes: Positive for visual disturbance.  Respiratory: Positive for cough.   Cardiovascular: Negative.    Patient Active Problem List   Diagnosis Date Noted  . Mucopurulent chronic bronchitis 03/30/2014  . Hypokalemia 03/30/2014  . Dyslipidemia associated with type 2 diabetes mellitus 12/01/2013  . Hypoglycemia 10/26/2013  . Hyperglycemia 10/22/2013  . Cholecystitis 10/22/2013  . Sensation of cold in finger-Left 09/12/2013  . Pain of left hand 09/12/2013  . Numbness of left hand 07/29/2013  . Cramping of hands 07/29/2013  . 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 End stage renal disease 12/13/2012  . Encounter for routine gynecological examination 09/30/2012  . 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 Heme positive stool 08/18/2012  . Psoriasis 07/26/2012  . DM type 2, uncontrolled, with neuropathy 04/29/2012  . Chest pain   . RLS (restless legs syndrome)   . Chronic kidney disease, stage IV (severe)   . Gout 11/22/2009  . Anemia, normocytic normochromic 07/24/2009  . Hypothyroidism 01/22/2009  . Hyperlipidemia 01/22/2009  . Obesity 01/22/2009  . Hypertension 01/22/2009  . Gastroesophageal reflux disease 01/22/2009   Outpatient Encounter Prescriptions as of  06/28/2014  Medication Sig  . albuterol (PROAIR HFA) 108 (90 BASE) MCG/ACT inhaler Inhale 2 puffs into the lungs every 6 (six) hours as needed for wheezing or shortness of breath.  . allopurinol (ZYLOPRIM) 100 MG tablet Take 1 tablet (100 mg total) by mouth every morning.  Marland Kitchen amLODipine (NORVASC) 5 MG tablet TAKE ONE TABLET BY MOUTH ONCE DAILY IN THE EVENING  . carvedilol (COREG) 25 MG tablet Take 1 tablet (25 mg total) by mouth 2 (two) times daily with a meal. (Patient taking differently: Take 25 mg by mouth daily. )  . citalopram (CELEXA) 40 MG tablet Take 1 tablet (40 mg total) by mouth daily.  . ferrous sulfate 325 (65 FE) MG tablet TAKE ONE TABLET BY MOUTH ONCE DAILY WITH BREAKFAST  . Fluticasone Furoate-Vilanterol (BREO ELLIPTA) 100-25 MCG/INH AEPB Inhale 1 puff into the lungs 2 (two) times daily.  Marland Kitchen gabapentin (NEURONTIN) 800 MG tablet TAKE ONE TABLET BY MOUTH THREE TIMES DAILY  . glucose blood (ACCU-CHEK AVIVA PLUS) test strip 1 each by Other route 4 (four) times daily.  Marland Kitchen HYDROcodone-acetaminophen (NORCO) 10-325 MG per tablet Take 1 tablet by mouth every 6 (six) hours as needed.  . insulin aspart (NOVOLOG FLEXPEN) 100 UNIT/ML FlexPen Inject 45 Units into the skin 3 (three) times daily with meals.  . Insulin Glargine (LANTUS SOLOSTAR) 100 UNIT/ML Solostar Pen Inject 65 units SQ prior to breakfast and 40 units prior to evening meal  . levothyroxine (SYNTHROID, LEVOTHROID) 50 MCG tablet Take 1 tablet (50 mcg total) by mouth every evening.  . linagliptin (TRADJENTA) 5 MG TABS tablet  Take 1 tablet (5 mg total) by mouth daily.  Marland Kitchen losartan (COZAAR) 50 MG tablet Take 50 mg by mouth daily.   . mupirocin ointment (BACTROBAN) 2 % Apply 1 application topically 2 (two) times daily.  . nitroGLYCERIN (NITROSTAT) 0.4 MG SL tablet Place 1 tablet (0.4 mg total) under the tongue every 5 (five) minutes as needed for chest pain.  Marland Kitchen omeprazole (PRILOSEC) 20 MG capsule Take 1 capsule (20 mg total) by mouth daily.    . potassium chloride (K-DUR,KLOR-CON) 10 MEQ tablet Take 1 tablet (10 mEq total) by mouth 2 (two) times daily.  . pravastatin (PRAVACHOL) 40 MG tablet Take 1 tablet (40 mg total) by mouth at bedtime.  Marland Kitchen rOPINIRole (REQUIP) 1 MG tablet Take 1 tablet (1 mg total) by mouth at bedtime.  . sodium bicarbonate 650 MG tablet Take 1 tablet (650 mg total) by mouth 3 (three) times daily.  Marland Kitchen tiotropium (SPIRIVA HANDIHALER) 18 MCG inhalation capsule Place 1 capsule (18 mcg total) into inhaler and inhale every morning.  . torsemide (DEMADEX) 20 MG tablet Take 80 mg by mouth daily.  Marland Kitchen doxycycline (VIBRA-TABS) 100 MG tablet Take 1 tablet (100 mg total) by mouth 2 (two) times daily.  . [DISCONTINUED] cyclobenzaprine (FLEXERIL) 10 MG tablet Take 1 tablet (10 mg total) by mouth 3 (three) times daily as needed for muscle spasms.  . [DISCONTINUED] predniSONE (DELTASONE) 20 MG tablet Take 3 po qd x 2 days then 2 po qd x 2 days then 1  Po qd x 2 days then stop (Patient taking differently: Take 60 mg by mouth daily. Take 3 po qd x 2 days then 2 po qd x 2 days then 1  Po qd x 2 days then stop)      Objective:   Physical Exam  Constitutional: She appears well-developed and well-nourished.  Eyes: Left eye exhibits discharge.  Cataract visible in the right eye not as sure there is one present in the left eye.  Cardiovascular: Normal rate.   Pulmonary/Chest: Effort normal. She has wheezes.          Assessment & Plan:  1. Cataract  - Ambulatory referral to Ophthalmology  2. DM type 2, uncontrolled, with neuropathy Hopefully we can get diabetes back more close to goal off prednisone - POCT glycosylated hemoglobin (Hb A1C)  3. Acute bronchitis, unspecified organism Rx doxycycline 100 mg twice a day 1 week Mucinex Drink plenty of fluids  Wardell Honour MD

## 2014-06-29 ENCOUNTER — Encounter (HOSPITAL_COMMUNITY): Payer: Self-pay | Admitting: Emergency Medicine

## 2014-06-29 ENCOUNTER — Emergency Department (HOSPITAL_COMMUNITY): Payer: Commercial Managed Care - HMO

## 2014-06-29 ENCOUNTER — Emergency Department (HOSPITAL_COMMUNITY)
Admission: EM | Admit: 2014-06-29 | Discharge: 2014-06-29 | Disposition: A | Discharge status: Home / Self Care | Payer: Commercial Managed Care - HMO | Source: Home / Self Care | Attending: Emergency Medicine | Admitting: Emergency Medicine

## 2014-06-29 ENCOUNTER — Inpatient Hospital Stay (HOSPITAL_COMMUNITY)
Admission: EM | Admit: 2014-06-29 | Discharge: 2014-07-04 | DRG: 281 | Disposition: A | Discharge status: Home / Self Care | Payer: Commercial Managed Care - HMO | Attending: Internal Medicine | Admitting: Internal Medicine

## 2014-06-29 DIAGNOSIS — I10 Essential (primary) hypertension: Secondary | ICD-10-CM | POA: Diagnosis present

## 2014-06-29 DIAGNOSIS — R0789 Other chest pain: Secondary | ICD-10-CM

## 2014-06-29 DIAGNOSIS — I2510 Atherosclerotic heart disease of native coronary artery without angina pectoris: Secondary | ICD-10-CM | POA: Diagnosis present

## 2014-06-29 DIAGNOSIS — E039 Hypothyroidism, unspecified: Secondary | ICD-10-CM | POA: Diagnosis present

## 2014-06-29 DIAGNOSIS — R0902 Hypoxemia: Secondary | ICD-10-CM

## 2014-06-29 DIAGNOSIS — R072 Precordial pain: Secondary | ICD-10-CM | POA: Diagnosis not present

## 2014-06-29 DIAGNOSIS — E038 Other specified hypothyroidism: Secondary | ICD-10-CM | POA: Diagnosis not present

## 2014-06-29 DIAGNOSIS — IMO0002 Reserved for concepts with insufficient information to code with codable children: Secondary | ICD-10-CM | POA: Diagnosis present

## 2014-06-29 DIAGNOSIS — E1169 Type 2 diabetes mellitus with other specified complication: Secondary | ICD-10-CM | POA: Diagnosis present

## 2014-06-29 DIAGNOSIS — R7989 Other specified abnormal findings of blood chemistry: Secondary | ICD-10-CM | POA: Diagnosis not present

## 2014-06-29 DIAGNOSIS — I248 Other forms of acute ischemic heart disease: Secondary | ICD-10-CM | POA: Diagnosis not present

## 2014-06-29 DIAGNOSIS — E785 Hyperlipidemia, unspecified: Secondary | ICD-10-CM | POA: Diagnosis present

## 2014-06-29 DIAGNOSIS — R079 Chest pain, unspecified: Secondary | ICD-10-CM

## 2014-06-29 DIAGNOSIS — I214 Non-ST elevation (NSTEMI) myocardial infarction: Secondary | ICD-10-CM | POA: Diagnosis present

## 2014-06-29 DIAGNOSIS — I252 Old myocardial infarction: Secondary | ICD-10-CM

## 2014-06-29 DIAGNOSIS — E1140 Type 2 diabetes mellitus with diabetic neuropathy, unspecified: Secondary | ICD-10-CM | POA: Diagnosis present

## 2014-06-29 DIAGNOSIS — G2581 Restless legs syndrome: Secondary | ICD-10-CM | POA: Diagnosis present

## 2014-06-29 DIAGNOSIS — J209 Acute bronchitis, unspecified: Secondary | ICD-10-CM | POA: Diagnosis present

## 2014-06-29 DIAGNOSIS — R778 Other specified abnormalities of plasma proteins: Secondary | ICD-10-CM | POA: Diagnosis present

## 2014-06-29 DIAGNOSIS — I809 Phlebitis and thrombophlebitis of unspecified site: Secondary | ICD-10-CM

## 2014-06-29 DIAGNOSIS — J449 Chronic obstructive pulmonary disease, unspecified: Secondary | ICD-10-CM | POA: Diagnosis present

## 2014-06-29 DIAGNOSIS — N184 Chronic kidney disease, stage 4 (severe): Secondary | ICD-10-CM | POA: Diagnosis present

## 2014-06-29 DIAGNOSIS — K529 Noninfective gastroenteritis and colitis, unspecified: Secondary | ICD-10-CM

## 2014-06-29 DIAGNOSIS — E1165 Type 2 diabetes mellitus with hyperglycemia: Secondary | ICD-10-CM | POA: Diagnosis present

## 2014-06-29 DIAGNOSIS — E669 Obesity, unspecified: Secondary | ICD-10-CM | POA: Diagnosis present

## 2014-06-29 DIAGNOSIS — I129 Hypertensive chronic kidney disease with stage 1 through stage 4 chronic kidney disease, or unspecified chronic kidney disease: Secondary | ICD-10-CM | POA: Diagnosis present

## 2014-06-29 DIAGNOSIS — E034 Atrophy of thyroid (acquired): Secondary | ICD-10-CM

## 2014-06-29 DIAGNOSIS — F329 Major depressive disorder, single episode, unspecified: Secondary | ICD-10-CM | POA: Diagnosis present

## 2014-06-29 DIAGNOSIS — K219 Gastro-esophageal reflux disease without esophagitis: Secondary | ICD-10-CM | POA: Diagnosis present

## 2014-06-29 DIAGNOSIS — Z794 Long term (current) use of insulin: Secondary | ICD-10-CM

## 2014-06-29 HISTORY — DX: Personal history of other malignant neoplasm of skin: Z85.828

## 2014-06-29 HISTORY — DX: Gastrointestinal hemorrhage, unspecified: K92.2

## 2014-06-29 HISTORY — DX: Type 2 diabetes mellitus without complications: E11.9

## 2014-06-29 HISTORY — DX: Personal history of diseases of the skin and subcutaneous tissue: Z87.2

## 2014-06-29 HISTORY — DX: Essential (primary) hypertension: I10

## 2014-06-29 LAB — CBC WITH DIFFERENTIAL/PLATELET
BASOS ABS: 0 10*3/uL (ref 0.0–0.1)
BASOS ABS: 0 10*3/uL (ref 0.0–0.1)
Basophils Relative: 0 % (ref 0–1)
Basophils Relative: 1 % (ref 0–1)
EOS PCT: 3 % (ref 0–5)
EOS PCT: 4 % (ref 0–5)
Eosinophils Absolute: 0.2 10*3/uL (ref 0.0–0.7)
Eosinophils Absolute: 0.2 10*3/uL (ref 0.0–0.7)
HCT: 37.7 % (ref 36.0–46.0)
HEMATOCRIT: 37.8 % (ref 36.0–46.0)
HEMOGLOBIN: 12.3 g/dL (ref 12.0–15.0)
Hemoglobin: 12.2 g/dL (ref 12.0–15.0)
LYMPHS PCT: 26 % (ref 12–46)
LYMPHS PCT: 30 % (ref 12–46)
Lymphs Abs: 2 10*3/uL (ref 0.7–4.0)
Lymphs Abs: 1.9 10*3/uL (ref 0.7–4.0)
MCH: 29.5 pg (ref 26.0–34.0)
MCH: 29.9 pg (ref 26.0–34.0)
MCHC: 32.4 g/dL (ref 30.0–36.0)
MCHC: 32.5 g/dL (ref 30.0–36.0)
MCV: 91.1 fL (ref 78.0–100.0)
MCV: 92 fL (ref 78.0–100.0)
MONO ABS: 0.8 10*3/uL (ref 0.1–1.0)
MONOS PCT: 12 % (ref 3–12)
Monocytes Absolute: 1 10*3/uL (ref 0.1–1.0)
Monocytes Relative: 13 % — ABNORMAL HIGH (ref 3–12)
NEUTROS ABS: 4.6 10*3/uL (ref 1.7–7.7)
NEUTROS ABS: 3.4 10*3/uL (ref 1.7–7.7)
Neutrophils Relative %: 58 % (ref 43–77)
Neutrophils Relative %: 53 % (ref 43–77)
PLATELETS: 247 10*3/uL (ref 150–400)
Platelets: 260 10*3/uL (ref 150–400)
RBC: 4.14 MIL/uL (ref 3.87–5.11)
RBC: 4.11 MIL/uL (ref 3.87–5.11)
RDW: 14.4 % (ref 11.5–15.5)
RDW: 14.7 % (ref 11.5–15.5)
WBC: 7.9 10*3/uL (ref 4.0–10.5)
WBC: 6.3 10*3/uL (ref 4.0–10.5)

## 2014-06-29 LAB — COMPREHENSIVE METABOLIC PANEL
ALT: 29 U/L (ref 0–35)
ANION GAP: 12 (ref 5–15)
AST: 22 U/L (ref 0–37)
Albumin: 3 g/dL — ABNORMAL LOW (ref 3.5–5.2)
Alkaline Phosphatase: 78 U/L (ref 39–117)
BILIRUBIN TOTAL: 0.7 mg/dL (ref 0.3–1.2)
BUN: 18 mg/dL (ref 6–23)
CHLORIDE: 91 mmol/L — AB (ref 96–112)
CO2: 30 mmol/L (ref 19–32)
CREATININE: 2.12 mg/dL — AB (ref 0.50–1.10)
Calcium: 8 mg/dL — ABNORMAL LOW (ref 8.4–10.5)
GFR calc non Af Amer: 23 mL/min — ABNORMAL LOW (ref >90)
GFR, EST AFRICAN AMERICAN: 27 mL/min — AB (ref >90)
GLUCOSE: 281 mg/dL — AB (ref 70–99)
POTASSIUM: 3.3 mmol/L — AB (ref 3.5–5.1)
SODIUM: 133 mmol/L — AB (ref 135–145)
Total Protein: 6.4 g/dL (ref 6.0–8.3)

## 2014-06-29 LAB — TROPONIN I: Troponin I: 0.09 ng/mL — ABNORMAL HIGH (ref <0.031)

## 2014-06-29 LAB — BASIC METABOLIC PANEL
ANION GAP: 10 (ref 5–15)
BUN: 18 mg/dL (ref 6–23)
CHLORIDE: 92 mmol/L — AB (ref 96–112)
CO2: 30 mmol/L (ref 19–32)
Calcium: 7.8 mg/dL — ABNORMAL LOW (ref 8.4–10.5)
Creatinine, Ser: 2.2 mg/dL — ABNORMAL HIGH (ref 0.50–1.10)
GFR calc non Af Amer: 22 mL/min — ABNORMAL LOW (ref >90)
GFR, EST AFRICAN AMERICAN: 26 mL/min — AB (ref >90)
Glucose, Bld: 434 mg/dL — ABNORMAL HIGH (ref 70–99)
POTASSIUM: 3.6 mmol/L (ref 3.5–5.1)
SODIUM: 132 mmol/L — AB (ref 135–145)

## 2014-06-29 LAB — RAPID URINE DRUG SCREEN, HOSP PERFORMED
AMPHETAMINES: NOT DETECTED
Barbiturates: NOT DETECTED
Benzodiazepines: NOT DETECTED
Cocaine: NOT DETECTED
OPIATES: NOT DETECTED
Tetrahydrocannabinol: NOT DETECTED

## 2014-06-29 LAB — URINALYSIS, ROUTINE W REFLEX MICROSCOPIC
Bilirubin Urine: NEGATIVE
Glucose, UA: 250 mg/dL — AB
Ketones, ur: NEGATIVE mg/dL
Leukocytes, UA: NEGATIVE
NITRITE: NEGATIVE
PROTEIN: NEGATIVE mg/dL
SPECIFIC GRAVITY, URINE: 1.01 (ref 1.005–1.030)
UROBILINOGEN UA: 0.2 mg/dL (ref 0.0–1.0)
pH: 5 (ref 5.0–8.0)

## 2014-06-29 LAB — URINE MICROSCOPIC-ADD ON

## 2014-06-29 LAB — D-DIMER, QUANTITATIVE (NOT AT ARMC): D-Dimer, Quant: 1.95 ug/mL-FEU — ABNORMAL HIGH (ref 0.00–0.48)

## 2014-06-29 LAB — CBG MONITORING, ED: Glucose-Capillary: 261 mg/dL — ABNORMAL HIGH (ref 70–99)

## 2014-06-29 MED ORDER — ONDANSETRON 8 MG PO TBDP: ORAL_TABLET | ORAL | Status: DC

## 2014-06-29 MED ORDER — SULFAMETHOXAZOLE-TRIMETHOPRIM 800-160 MG PO TABS: 1.0000 | ORAL_TABLET | Freq: Two times a day (BID) | ORAL | Status: DC

## 2014-06-29 MED ORDER — SODIUM CHLORIDE 0.9 % IV SOLN: Freq: Once | INTRAVENOUS | Status: DC

## 2014-06-29 MED ORDER — ENOXAPARIN SODIUM 100 MG/ML ~~LOC~~ SOLN
115.0000 mg | Freq: Once | SUBCUTANEOUS | Status: AC
Start: 2014-06-29 — End: 2014-06-29
  Administered 2014-06-29: 115 mg via SUBCUTANEOUS
  Filled 2014-06-29: qty 2

## 2014-06-29 MED ORDER — SODIUM CHLORIDE 0.9 % IV BOLUS (SEPSIS)
1000.0000 mL | Freq: Once | INTRAVENOUS | Status: AC
  Administered 2014-06-29: 1000 mL via INTRAVENOUS

## 2014-06-29 MED ORDER — ONDANSETRON HCL 4 MG/2ML IJ SOLN
4.0000 mg | Freq: Once | INTRAMUSCULAR | Status: AC
  Administered 2014-06-29: 4 mg via INTRAVENOUS
  Filled 2014-06-29: qty 2

## 2014-06-29 MED ORDER — PANTOPRAZOLE SODIUM 40 MG IV SOLR
40.0000 mg | Freq: Once | INTRAVENOUS | Status: AC
  Administered 2014-06-29: 40 mg via INTRAVENOUS
  Filled 2014-06-29: qty 40

## 2014-06-29 MED ORDER — ASPIRIN 81 MG PO CHEW
324.0000 mg | CHEWABLE_TABLET | Freq: Once | ORAL | Status: AC
  Administered 2014-06-29: 324 mg via ORAL
  Filled 2014-06-29: qty 4

## 2014-06-29 NOTE — ED Notes (Signed)
MD at bedside. 

## 2014-06-29 NOTE — ED Notes (Signed)
Patient ambulatory to and from restroom, family walking with patient. No deficits noted.

## 2014-06-29 NOTE — ED Notes (Addendum)
Pt states she went to PCP yesterday (Dr. Sabra Heck) and was diagnosed with bronchitis.  She has not yet picked up her prescription for bronchitis. She started vomiting last night and has not had anything to eat today.  She had been treated with prednisone for a "blistering disease" which she describes as a condition in which her immune system attacks her skin and causes blistering.  She weaned herself off of the prednisone last week because she has not noticed any more blistering and developed Cushing's syndrome and cataracts from taking the medication.  She was treated for about 4 months with steroids. She reports weakness and says that she feels like she cannot function.  Pt denies home oxygen use but states that she "is going to need it soon."

## 2014-06-29 NOTE — ED Notes (Signed)
Daughter Jenny Reichmann [phone]

## 2014-06-29 NOTE — ED Notes (Signed)
Patient was diagnosed with bronchitis 1 day ago. Patient started taking antibiotics today, and started having chest pain after taking medication. Patient states chest pain is worse with breathing, patient denies shortness of breath, denies NV. Patient is A&OX4 at this time.

## 2014-06-29 NOTE — ED Notes (Signed)
Took Doxycyline 100 mg today at 3 am, pt previously said SMZ/TMP.  Started having chest pain about 3 pm.

## 2014-06-29 NOTE — ED Notes (Signed)
Patient's O2 sat 84-86% on room air. Patient placed on 2.5 liters oxygen via [place]. O2 sat now 93%. Dr Lacinda Axon made aware.

## 2014-06-29 NOTE — ED Provider Notes (Signed)
CSN: [id number]     Arrival date & time 06/29/14  1846 History   First MD Initiated Contact with Patient 06/29/14 1904     Chief Complaint  Patient presents with  . Chest Pain     (Consider location/radiation/quality/duration/timing/severity/associated sxs/prior Treatment) The history is provided by the patient.     [name] is a 64 y.o. female here for evaluation of left anterior chest pain which started at 3 PM has been constant since then. The pain waxes and wanes. The pain is 10 out of 10 at the worst. This pain feels like "heaviness". She has not had any change in her ongoing shortness of breath. She does have cough currently and was started on doxycycline yesterday for it. Her pain started after she took a dose of doxycycline, today. Shortness of breath is improved after being started on oxygen emergency department. She was able to eat after going home today. She been seen earlier in the ED today, with some vomiting, but that could better after taking the antiemetic. She denies fever, diaphoresis, nausea, vomiting or diarrhea. She is taking her usual medications. She does not use oxygen at home. She has renal disease, and has had a fistula in her left arm in preparation for dialysis. There are no other known modifying factors.   Past Medical History  Diagnosis Date  . Chest pain 2003    ?h/o MI, syncope; 2003 stress nuclear-inferoseptal scar, EF-54%; 2007-MI ruled out, echocardiogram-normal LV size, wall thickness and function  . RLS (restless legs syndrome)     sleep study in 2010: No apnea; moderate periodic movements  . GERD (gastroesophageal reflux disease)     H/o upper GI bleed  . GI bleeding   . Obese   . Gout   . Hypothyroidism   . Low back pain   . Cellulitis     Neck  . Diabetes mellitus 1998    A1c-7.1 in 2010, 6.7 in 2013  . Hyperlipidemia     Fasting lipids 2013:158, 152, 35, 93  . Hypertension 2002  . Anemia   . Hyperkalemia   . Irregular heart beat   .  PONV (postoperative nausea and vomiting)   . Myocardial infarction   . CAD (coronary artery disease)     dr. Lattie Haw (Linna Hoff)  . Depression     Insomnia  . Headache(784.0)   . Vitiligo   . H/O hiatal hernia   . Arthritis   . Cancer     HX SKIN CANCER  . Anginal pain     last cp few months ago.  Marland Kitchen Dysrhythmia     "skips a beat"  . COPD (chronic obstructive pulmonary disease)   . Chronic kidney disease, stage III (moderate)     Creat-1.9 in 04/2008, 2.65 in 02/2010(noHD  . Renal insufficiency   . Neuropathy    Past Surgical History  Procedure Laterality Date  . Appendectomy    . Carpal tunnel release  1992, 1993, 2011    Right in 2011; previously bilateral  . Tubal ligation  1976  . Partial hysterectomy  1985    Vaginal  . Shoulder surgery  1997    Left  . Incise and drain abcess  2010    Neck-MRSA  . Colonoscopy  02/2002, 07/2009    few diverticula, otherwise negative  . Foot surgery  1984    Bilateral heel spurs  . Abdominal hysterectomy    . Shoulder surgery      RIGHT  ADJUSTED   .  Av fistula placement Left 07/16/2012    Procedure: ARTERIOVENOUS (AV) FISTULA CREATION;  Surgeon: Mal Misty, MD;  Location: [place];  Service: Vascular;  Laterality: Left;  . Av fistula placement Left 07/29/2012    Procedure: ARTERIOVENOUS (AV) FISTULA CREATION;  Surgeon: Mal Misty, MD;  Location: [place];  Service: Vascular;  Laterality: Left;  Left Brachial-Cephalic Arteriovenous Fistula Creation  . Colonoscopy with esophagogastroduodenoscopy (egd) N/A 08/27/2012    RMR: Normal esophagus s/p 54 F dilator. Small hiatal hernia. Gastric polyp removed, oozing (hyperplastic), which could explain heme positive stool:Colonic diverticulosis  . Revison of arteriovenous fistula Left 08/03/2013    Procedure: BANDING OF BRACHIOCEPHALIC ARTERIOVENOUS FISTULA;  Surgeon: Conrad Bejou, MD;  Location: [place];  Service: Vascular;  Laterality: Left;  . Thrombectomy w/ embolectomy Left 08/03/2013     Procedure: THROMBECTOMY OF ARTERIOVENOUS FISTULA  AND REVISION OF  ARTERIOVENOUS FISTULA BANDING.;  Surgeon: Conrad , MD;  Location: [place];  Service: Vascular;  Laterality: Left;  . Esophagogastroduodenoscopy N/A 10/27/2013    Dr.Rourk- esophageal mucosa appeared normal. tubular esophagus widely patent throughout its course. stomach empty. patient had longitudinal proximal gastric ersions overlying multiple proximal folds. nodular mucosa with overlying antral erosions as well. no ulcer or infiltrating process seen patent pylorus, normal first and second portion of the duodenum. bx= reactive gastropathy  . Savory dilation N/A 10/27/2013    Procedure: SAVORY DILATION;  Surgeon: Daneil Dolin, MD;  [facility];  Service: Endoscopy;  Laterality: N/A;  Venia Minks dilation N/A 10/27/2013    Procedure: Venia Minks DILATION;  Surgeon: Daneil Dolin, MD;  [facility];  Service: Endoscopy;  Laterality: N/A;  . Shuntogram Left 08/01/2013    Procedure: FISTULOGRAM;  Surgeon: Conrad , MD;  Location: [facility];  Service: Cardiovascular;  Laterality: Left;   Family History  Problem Relation Age of Onset  . Emphysema Mother   . COPD Mother   . Colon cancer Father   . Diabetes Father   . Cancer Father     colon  . Hypertension Father   . Congestive Heart Failure Maternal Grandmother   . Cancer Paternal Grandmother   . Rheum arthritis Sister   . Cancer Brother 33    colon CA   History  Substance Use Topics  . Smoking status: Never Smoker   . Smokeless tobacco: Never Used  . Alcohol Use: No   OB History    Gravida Para Term Preterm AB TAB SAB Ectopic Multiple Living            3     Review of Systems  All other systems reviewed and are negative.     Allergies  Latex; Vancomycin; Vancomycin hcl; Adhesive; and Quinapril hcl  Home Medications   Prior to Admission medications   Medication Sig Start Date End Date Taking? Authorizing Provider  albuterol (PROAIR HFA)  108 (90 BASE) MCG/ACT inhaler Inhale 2 puffs into the lungs every 6 (six) hours as needed for wheezing or shortness of breath. 07/04/13  Yes Vernie Shanks, MD  allopurinol (ZYLOPRIM) 100 MG tablet Take 1 tablet (100 mg total) by mouth every morning. 07/04/13  Yes Vernie Shanks, MD  amLODipine (NORVASC) 5 MG tablet TAKE ONE TABLET BY MOUTH ONCE DAILY IN THE EVENING 05/01/14  Yes Mary-Margaret Hassell Done, FNP  carvedilol (COREG) 25 MG tablet Take 1 tablet (25 mg total) by mouth 2 (two) times daily with a meal. Patient taking differently: Take 25  mg by mouth daily.  07/04/13  Yes Vernie Shanks, MD  citalopram (CELEXA) 40 MG tablet Take 1 tablet (40 mg total) by mouth daily. 03/30/14  Yes Mary-Margaret Hassell Done, FNP  ferrous sulfate 325 (65 FE) MG tablet TAKE ONE TABLET BY MOUTH ONCE DAILY WITH BREAKFAST 03/15/14  Yes Sharion Balloon, FNP  Fluticasone Furoate-Vilanterol (BREO ELLIPTA) 100-25 MCG/INH AEPB Inhale 1 puff into the lungs 2 (two) times daily.   Yes Historical Provider, MD  gabapentin (NEURONTIN) 800 MG tablet TAKE ONE TABLET BY MOUTH THREE TIMES DAILY Patient taking differently: TAKE THREE  TABLET BY MOUTH DAILY AT BEDTIME 03/15/14  Yes Mary-Margaret Hassell Done, FNP  insulin aspart (NOVOLOG FLEXPEN) 100 UNIT/ML FlexPen Inject 45 Units into the skin 3 (three) times daily with meals. 06/06/14  Yes Mary-Margaret Hassell Done, FNP  Insulin Glargine (LANTUS SOLOSTAR) 100 UNIT/ML Solostar Pen Inject 65 units SQ prior to breakfast and 40 units prior to evening meal Patient taking differently: Inject 45-60 Units into the skin 2 (two) times daily. Inject 60 units SQ prior to breakfast and 45 units prior to evening meal 01/06/14  Yes Tammy Eckard, PHARMD  levothyroxine (SYNTHROID, LEVOTHROID) 50 MCG tablet Take 1 tablet (50 mcg total) by mouth every evening. 03/30/14  Yes Mary-Margaret Hassell Done, FNP  linagliptin (TRADJENTA) 5 MG TABS tablet Take 1 tablet (5 mg total) by mouth daily. 03/30/14  Yes Mary-Margaret Hassell Done, FNP   losartan (COZAAR) 50 MG tablet Take 50 mg by mouth daily.  06/13/13  Yes Historical Provider, MD  mupirocin ointment (BACTROBAN) 2 % Apply 1 application topically 2 (two) times daily. 05/03/14  Yes Wardell Honour, MD  nitroGLYCERIN (NITROSTAT) 0.4 MG SL tablet Place 1 tablet (0.4 mg total) under the tongue every 5 (five) minutes as needed for chest pain. 09/30/13  Yes Wardell Honour, MD  omeprazole (PRILOSEC) 20 MG capsule Take 1 capsule (20 mg total) by mouth daily. 03/30/14  Yes Mary-Margaret Hassell Done, FNP  potassium chloride (K-DUR,KLOR-CON) 10 MEQ tablet Take 1 tablet (10 mEq total) by mouth 2 (two) times daily. 03/30/14  Yes Mary-Margaret Hassell Done, FNP  pravastatin (PRAVACHOL) 40 MG tablet Take 1 tablet (40 mg total) by mouth at bedtime. 05/22/14  Yes Mary-Margaret Hassell Done, FNP  rOPINIRole (REQUIP) 1 MG tablet Take 1 tablet (1 mg total) by mouth at bedtime. 07/04/13  Yes Vernie Shanks, MD  sodium bicarbonate 650 MG tablet Take 1 tablet (650 mg total) by mouth 3 (three) times daily. Patient taking differently: Take 650 mg by mouth 2 (two) times daily.  03/30/14  Yes Mary-Margaret Hassell Done, FNP  tiotropium (SPIRIVA HANDIHALER) 18 MCG inhalation capsule Place 1 capsule (18 mcg total) into inhaler and inhale every morning. 03/30/14  Yes Mary-Margaret Hassell Done, FNP  torsemide (DEMADEX) 20 MG tablet Take 40 mg by mouth 2 (two) times daily.    Yes Historical Provider, MD  doxycycline (VIBRA-TABS) 100 MG tablet Take 1 tablet (100 mg total) by mouth 2 (two) times daily. Patient not taking: Reported on 06/29/2014 06/28/14   Wardell Honour, MD  HYDROcodone-acetaminophen [facility]) 10-325 MG per tablet Take 1 tablet by mouth every 6 (six) hours as needed. Patient not taking: Reported on 06/29/2014 06/28/14   Wardell Honour, MD  ondansetron (ZOFRAN ODT) 8 MG disintegrating tablet 8mg  ODT q4 hours prn nausea Patient taking differently: Take 8 mg by mouth every 4 (four) hours as needed. 8mg  ODT q4 hours prn nausea 06/29/14   Nat Christen, MD  sulfamethoxazole-trimethoprim [facility]) 800-160 MG per tablet  Take 1 tablet by mouth 2 (two) times daily. 06/29/14   Nat Christen, MD   BP 160/72 mmHg  Pulse 80  Temp(Src) 98.3 F (36.8 C) (Oral)  Resp 20  Ht 5\' 7"  (1.702 m)  Wt 252 lb (114.306 kg)  BMI 39.46 kg/m2  SpO2 93% Physical Exam  Constitutional: She is oriented to person, place, and time. She appears well-developed. No distress.  She is overweight  HENT:  Head: Normocephalic and atraumatic.  Right Ear: External ear normal.  Left Ear: External ear normal.  Eyes: Conjunctivae and EOM are normal. Pupils are equal, round, and reactive to light.  Neck: Normal range of motion and phonation normal. Neck supple.  Cardiovascular: Normal rate, regular rhythm and normal heart sounds.   Vascular fistula left arm, has normal thrill. It is nontender to palpation.  Pulmonary/Chest: Effort normal and breath sounds normal. No respiratory distress. She has no wheezes. She exhibits no tenderness and no bony tenderness.  Abdominal: Soft. There is no tenderness.  Musculoskeletal: Normal range of motion. She exhibits no edema or tenderness.  Neurological: She is alert and oriented to person, place, and time. No cranial nerve deficit or sensory deficit. She exhibits normal muscle tone. Coordination normal.  Skin: Skin is warm, dry and intact.  Psychiatric: She has a normal mood and affect. Her behavior is normal. Judgment and thought content normal.  Nursing note and vitals reviewed.   ED Course  Procedures (including critical care time)  Hypoxia on admission, improved with placement on nasal cannula oxygen.  Medications  pravastatin (PRAVACHOL) tablet 40 mg (40 mg Oral Given 06/30/14 0110)  mupirocin ointment (BACTROBAN) 2 % 1 application (1 application Topical Given 06/30/14 0924)  amLODipine (NORVASC) tablet 5 mg (5 mg Oral Given 06/30/14 0923)  citalopram (CELEXA) tablet 40 mg (40 mg Oral Given 06/30/14 0924)  levothyroxine  (SYNTHROID, LEVOTHROID) tablet 50 mcg (not administered)  pantoprazole (PROTONIX) EC tablet 40 mg (40 mg Oral Given 06/30/14 0924)  torsemide (DEMADEX) tablet 40 mg (40 mg Oral Given 06/30/14 0924)  carvedilol (COREG) tablet 25 mg (25 mg Oral Given 06/30/14 0923)  rOPINIRole (REQUIP) tablet 1 mg (1 mg Oral Given 06/30/14 0234)  morphine 2 MG/ML injection 2 mg (2 mg Intravenous Given 06/30/14 1030)  sodium chloride 0.9 % injection 3 mL (3 mLs Intravenous Given 06/30/14 0925)  sodium chloride 0.9 % injection 3 mL (not administered)  0.9 %  sodium chloride infusion (not administered)  ondansetron (ZOFRAN) tablet 4 mg (not administered)    Or  ondansetron (ZOFRAN) injection 4 mg (not administered)  ipratropium-albuterol (DUONEB) 0.5-2.5 (3) MG/3ML nebulizer solution 3 mL (3 mLs Nebulization Given 06/30/14 1329)  gabapentin (NEURONTIN) capsule 800 mg (800 mg Oral Given 06/30/14 0924)  insulin glargine (LANTUS) injection 45 Units (45 Units Subcutaneous Given 06/30/14 0924)  insulin aspart (novoLOG) injection 0-20 Units (20 Units Subcutaneous Given 06/30/14 1251)  insulin aspart (novoLOG) injection 4 Units (4 Units Subcutaneous Given 06/30/14 1251)  doxycycline (VIBRA-TABS) tablet 100 mg (not administered)  aspirin chewable tablet 81 mg (not administered)  sodium chloride 0.9 % bolus 1,000 mL (1,000 mLs Intravenous New Bag/Given 06/29/14 2326)  aspirin chewable tablet 324 mg (324 mg Oral Given 06/29/14 2324)  enoxaparin (LOVENOX) injection 115 mg (115 mg Subcutaneous Given 06/29/14 2324)  technetium TC 67M diethylenetriame-pentaacetic acid (DTPA) injection 40 milli Curie (40 milli Curies Inhalation Given 06/30/14 0130)  technetium albumin aggregated (MAA) injection solution 6 milli Curie (6 milli Curies Intravenous Contrast Given 06/30/14 0215)  Patient Vitals for the past 24 hrs:  BP Temp Temp src Pulse Resp SpO2 Height Weight  06/30/14 0736 - - - - - 93 % - -  06/30/14 [id number] (!) 160/72 mmHg 98.3 F (36.8 C) Oral 80 20 96  % - -  06/30/14 0042 (!) 150/73 mmHg 99 F (37.2 C) Oral 85 (!) 21 96 % - -  06/30/14 0000 135/56 mmHg - - 96 23 (!) 89 % - -  06/29/14 2300 (!) 127/53 mmHg - - 81 22 94 % - -  06/29/14 2200 (!) 118/51 mmHg - - 83 11 94 % - -  06/29/14 2151 130/59 mmHg - - 81 22 94 % - -  06/29/14 2055 - - - 85 20 96 % - -  06/29/14 1948 - - - 84 22 96 % - -  06/29/14 1900 144/59 mmHg 99.3 F (37.4 C) Oral 89 18 (!) 87 % 5\' 7"  (1.702 m) 252 lb (114.306 kg)    Heart score, by heart pathway- 4- high risk  CRITICAL CARE Performed by: Daleen Bo L Total critical care time:30 min. Critical care time was exclusive of separately billable procedures and treating other patients. Critical care was necessary to treat or prevent imminent or life-threatening deterioration. Critical care was time spent personally by me on the following activities: development of treatment plan with patient and/or surrogate as well as nursing, discussions with consultants, evaluation of patient's response to treatment, examination of patient, obtaining history from patient or surrogate, ordering and performing treatments and interventions, ordering and review of laboratory studies, ordering and review of radiographic studies, pulse oximetry and re-evaluation of patient's condition.   Labs Review Labs Reviewed  BASIC METABOLIC PANEL - Abnormal; Notable for the following:    Sodium 132 (*)    Chloride 92 (*)    Glucose, Bld 434 (*)    Creatinine, Ser 2.20 (*)    Calcium 7.8 (*)    GFR calc non Af Amer 22 (*)    GFR calc Af Amer 26 (*)    All other components within normal limits  URINALYSIS, ROUTINE W REFLEX MICROSCOPIC - Abnormal; Notable for the following:    Color, Urine STRAW (*)    Glucose, UA 250 (*)    Hgb urine dipstick TRACE (*)    All other components within normal limits  TROPONIN I - Abnormal; Notable for the following:    Troponin I 0.09 (*)    All other components within normal limits  D-DIMER, QUANTITATIVE  - Abnormal; Notable for the following:    D-Dimer, Quant 1.95 (*)    All other components within normal limits  URINE MICROSCOPIC-ADD ON - Abnormal; Notable for the following:    Squamous Epithelial / LPF MANY (*)    All other components within normal limits  CBC - Abnormal; Notable for the following:    Hemoglobin 11.8 (*)    All other components within normal limits  COMPREHENSIVE METABOLIC PANEL - Abnormal; Notable for the following:    Potassium 3.4 (*)    Chloride 95 (*)    Glucose, Bld 346 (*)    Creatinine, Ser 2.09 (*)    Calcium 8.0 (*)    Albumin 3.0 (*)    GFR calc non Af Amer 24 (*)    GFR calc Af Amer 27 (*)    All other components within normal limits  TROPONIN I - Abnormal; Notable for the following:    Troponin I 0.11 (*)    All other  components within normal limits  TROPONIN I - Abnormal; Notable for the following:    Troponin I 0.16 (*)    All other components within normal limits  GLUCOSE, CAPILLARY - Abnormal; Notable for the following:    Glucose-Capillary 400 (*)    All other components within normal limits  GLUCOSE, CAPILLARY - Abnormal; Notable for the following:    Glucose-Capillary 366 (*)    All other components within normal limits  GLUCOSE, CAPILLARY - Abnormal; Notable for the following:    Glucose-Capillary 362 (*)    All other components within normal limits  MRSA PCR SCREENING  CBC WITH DIFFERENTIAL/PLATELET  URINE RAPID DRUG SCREEN (HOSP PERFORMED)  TROPONIN I  HEMOGLOBIN A1C    Imaging Review Dg Chest 2 View  06/29/2014   CLINICAL DATA:  Cough and congestion for 2-3 days.  Emphysema.  EXAM: CHEST  2 VIEW  COMPARISON:  PA and lateral chest 10/22/2013.  FINDINGS: There is chronic, mild peribronchial thickening. No consolidative process, pneumothorax or effusion is identified. Heart size is mildly enlarged. Chronic, mild elevation the right hemidiaphragm relative to the left is unchanged.  IMPRESSION: No acute finding.  Chronic bronchitic  change.   Electronically Signed   By: Inge Rise M.D.   On: 06/29/2014 08:02   Nm Pulmonary Perf And Vent  06/30/2014   CLINICAL DATA:  Shortness of breath and chest pain. History of emphysema.  EXAM: NUCLEAR MEDICINE VENTILATION - PERFUSION LUNG SCAN  TECHNIQUE: Ventilation images were obtained in multiple projections using inhaled aerosol technetium 99 M DTPA. Perfusion images were obtained in multiple projections after intravenous injection of Tc-44m MAA.  RADIOPHARMACEUTICALS:  40 mCi Tc-38m DTPA aerosol and 6 mCi Tc-22m MAA  COMPARISON:  Chest radiographs 06/29/2014  FINDINGS: Ventilation: Mild heterogeneous distribution tracer activity throughout the lungs compatible with history of emphysema. Pooling of tracer in the central airways.  Perfusion: Normal homogeneous distribution of tracer activity throughout both lungs. No focal defects identified.  IMPRESSION: Low probability of pulmonary embolus.   Electronically Signed   By: Lucienne Capers M.D.   On: 06/30/2014 02:36   US Venous Img Upper Uni Left  06/29/2014   CLINICAL DATA:  Left upper extremity swelling for 2 weeks.  EXAM: Left UPPER EXTREMITY VENOUS DOPPLER ULTRASOUND  TECHNIQUE: Gray-scale sonography with graded compression, as well as color Doppler and duplex ultrasound were performed to evaluate the upper extremity deep venous system from the level of the subclavian vein and including the jugular, axillary, basilic, radial, ulnar and upper cephalic vein. Spectral Doppler was utilized to evaluate flow at rest and with distal augmentation maneuvers.  COMPARISON:  None.  FINDINGS: Contralateral Subclavian Vein: Respiratory phasicity is normal and symmetric with the symptomatic side. No evidence of thrombus. Normal compressibility.  Internal Jugular Vein: No evidence of thrombus. Normal compressibility, respiratory phasicity and response to augmentation.  Subclavian Vein: No evidence of thrombus. Normal compressibility, respiratory phasicity  and response to augmentation.  Axillary Vein: No evidence of thrombus. Normal compressibility, respiratory phasicity and response to augmentation.  Cephalic Vein: Partial compressibility of this vein in the region of the antecubital fossa is noted with flow consistent with nonocclusive thrombus.  Basilic Vein: No evidence of thrombus. Normal compressibility, respiratory phasicity and response to augmentation.  Brachial Veins: No evidence of thrombus. Normal compressibility, respiratory phasicity and response to augmentation.  Radial Veins: No evidence of thrombus. Normal compressibility, respiratory phasicity and response to augmentation.  Ulnar Veins: No evidence of thrombus. Normal compressibility, respiratory phasicity and  response to augmentation.  Venous Reflux:  None visualized.  Other Findings: Patient reportedly has fistula in the region of the left antecubital fossa.  IMPRESSION: Nonocclusive thrombus is noted in the left cephalic vein in the antecubital fossa. No other thrombus is noted in the left upper extremity.   Electronically Signed   By: Marijo Conception, M.D.   On: 06/29/2014 13:00     EKG Interpretation   Date/Time:  Thursday June 29 2014 18:56:32 EDT Ventricular Rate:  90 PR Interval:  156 QRS Duration: 88 QT Interval:  396 QTC Calculation: 484 R Axis:   37 Text Interpretation:  Normal sinus rhythm Normal ECG No significant change  since last tracing Confirmed by WARD,  DO, KRISTEN [id number]) on 06/29/2014  7:00:19 PM      MDM   Final diagnoses:  Chest pain  Hypoxia    Nonspecific chest pain, with hypoxia. EKG does not indicate acute ischemic changes or MI. Increased D-dimer with elevated Creat., so cannot do CTA chest to eval. For PE Will need to admit for stabilization and r/o PE and ACS  Nursing Notes Reviewed/ Care Coordinated Applicable Imaging Reviewed Interpretation of Laboratory Data incorporated into ED treatment   Plan: Admit    Daleen Bo,  MD 06/30/14 1333

## 2014-06-29 NOTE — ED Notes (Signed)
PT states dx at her primary MD yesterday with bronchitis but hasn't started her antibiotic yet. PT states nausea and vomiting that stated this am. PT states SOB on exertion and productive green sputum cough.

## 2014-06-29 NOTE — ED Provider Notes (Signed)
CSN: [id number]     Arrival date & time 06/29/14  [id number] History  This chart was scribed for Nat Christen, MD by Donato Schultz, ED Scribe. This patient was seen in room APA12/APA12 and the patient's care was started at 8:09 AM.    Chief Complaint  Patient presents with  . Emesis   Patient is a 64 y.o. female presenting with vomiting. The history is provided by the patient. No language interpreter was used.  Emesis  HPI Comments: [name] is a 64 y.o. female with a history of Cushion's Syndrome, DM and cataracts who presents to the Emergency Department complaining of constant vomiting and nausea that started this morning.  She ate a hamburger patty last night and has not eaten anything this morning.  She saw Dr. Sabra Heck yesterday who diagnosed her with bronchitis.  She was prescribed Doxycycline but has not picked up the medication.   She was taking 40mg  Prednisone daily to treat a blistering disease for the past 4 months but discontinued the medication after not seeing any blisters on her skin.  She has not taken the medication since last week.  She states that the Prednisone caused her to develop the Cushing's Syndrome and cataracts.  Her sugars were in the 200s last night but she did not check them this morning.   Past Medical History  Diagnosis Date  . Chest pain 2003    ?h/o MI, syncope; 2003 stress nuclear-inferoseptal scar, EF-54%; 2007-MI ruled out, echocardiogram-normal LV size, wall thickness and function  . RLS (restless legs syndrome)     sleep study in 2010: No apnea; moderate periodic movements  . GERD (gastroesophageal reflux disease)     H/o upper GI bleed  . GI bleeding   . Obese   . Gout   . Hypothyroidism   . Low back pain   . Cellulitis     Neck  . Diabetes mellitus 1998    A1c-7.1 in 2010, 6.7 in 2013  . Hyperlipidemia     Fasting lipids 2013:158, 152, 35, 93  . Hypertension 2002  . Anemia   . Hyperkalemia   . Irregular heart beat   . PONV (postoperative nausea  and vomiting)   . Myocardial infarction   . CAD (coronary artery disease)     dr. Lattie Haw (Linna Hoff)  . Depression     Insomnia  . Headache(784.0)   . Vitiligo   . H/O hiatal hernia   . Arthritis   . Cancer     HX SKIN CANCER  . Anginal pain     last cp few months ago.  Marland Kitchen Dysrhythmia     "skips a beat"  . COPD (chronic obstructive pulmonary disease)   . Chronic kidney disease, stage III (moderate)     Creat-1.9 in 04/2008, 2.65 in 02/2010(noHD  . Renal insufficiency   . Neuropathy    Past Surgical History  Procedure Laterality Date  . Appendectomy    . Carpal tunnel release  1992, 1993, 2011    Right in 2011; previously bilateral  . Tubal ligation  1976  . Partial hysterectomy  1985    Vaginal  . Shoulder surgery  1997    Left  . Incise and drain abcess  2010    Neck-MRSA  . Colonoscopy  02/2002, 07/2009    few diverticula, otherwise negative  . Foot surgery  1984    Bilateral heel spurs  . Abdominal hysterectomy    . Shoulder surgery  RIGHT  ADJUSTED   . Av fistula placement Left 07/16/2012    Procedure: ARTERIOVENOUS (AV) FISTULA CREATION;  Surgeon: Mal Misty, MD;  Location: [place];  Service: Vascular;  Laterality: Left;  . Av fistula placement Left 07/29/2012    Procedure: ARTERIOVENOUS (AV) FISTULA CREATION;  Surgeon: Mal Misty, MD;  Location: [place];  Service: Vascular;  Laterality: Left;  Left Brachial-Cephalic Arteriovenous Fistula Creation  . Colonoscopy with esophagogastroduodenoscopy (egd) N/A 08/27/2012    RMR: Normal esophagus s/p 54 F dilator. Small hiatal hernia. Gastric polyp removed, oozing (hyperplastic), which could explain heme positive stool:Colonic diverticulosis  . Revison of arteriovenous fistula Left 08/03/2013    Procedure: BANDING OF BRACHIOCEPHALIC ARTERIOVENOUS FISTULA;  Surgeon: Conrad Greenbush, MD;  Location: [place];  Service: Vascular;  Laterality: Left;  . Thrombectomy w/ embolectomy Left 08/03/2013    Procedure: THROMBECTOMY OF  ARTERIOVENOUS FISTULA  AND REVISION OF  ARTERIOVENOUS FISTULA BANDING.;  Surgeon: Conrad Cowlitz, MD;  Location: [place];  Service: Vascular;  Laterality: Left;  . Esophagogastroduodenoscopy N/A 10/27/2013    Dr.Rourk- esophageal mucosa appeared normal. tubular esophagus widely patent throughout its course. stomach empty. patient had longitudinal proximal gastric ersions overlying multiple proximal folds. nodular mucosa with overlying antral erosions as well. no ulcer or infiltrating process seen patent pylorus, normal first and second portion of the duodenum. bx= reactive gastropathy  . Savory dilation N/A 10/27/2013    Procedure: SAVORY DILATION;  Surgeon: Daneil Dolin, MD;  [facility];  Service: Endoscopy;  Laterality: N/A;  Venia Minks dilation N/A 10/27/2013    Procedure: Venia Minks DILATION;  Surgeon: Daneil Dolin, MD;  [facility];  Service: Endoscopy;  Laterality: N/A;  . Shuntogram Left 08/01/2013    Procedure: FISTULOGRAM;  Surgeon: Conrad Forestburg, MD;  [facility];  Service: Cardiovascular;  Laterality: Left;   Family History  Problem Relation Age of Onset  . Emphysema Mother   . COPD Mother   . Colon cancer Father   . Diabetes Father   . Cancer Father     colon  . Hypertension Father   . Congestive Heart Failure Maternal Grandmother   . Cancer Paternal Grandmother   . Rheum arthritis Sister   . Cancer Brother 37    colon CA   History  Substance Use Topics  . Smoking status: Never Smoker   . Smokeless tobacco: Never Used  . Alcohol Use: No   OB History    Gravida Para Term Preterm AB TAB SAB Ectopic Multiple Living            3     Review of Systems  Gastrointestinal: Positive for nausea and vomiting.  Neurological: Positive for weakness.  All other systems reviewed and are negative.     Allergies  Latex; Vancomycin; Vancomycin hcl; Adhesive; and Quinapril hcl  Home Medications   Prior to Admission medications   Medication Sig Start  Date End Date Taking? Authorizing Provider  albuterol (PROAIR HFA) 108 (90 BASE) MCG/ACT inhaler Inhale 2 puffs into the lungs every 6 (six) hours as needed for wheezing or shortness of breath. 07/04/13  Yes Vernie Shanks, MD  allopurinol (ZYLOPRIM) 100 MG tablet Take 1 tablet (100 mg total) by mouth every morning. 07/04/13  Yes Vernie Shanks, MD  amLODipine (NORVASC) 5 MG tablet TAKE ONE TABLET BY MOUTH ONCE DAILY IN THE EVENING 05/01/14  Yes Mary-Margaret Hassell Done, FNP  carvedilol (COREG) 25 MG tablet Take 1  tablet (25 mg total) by mouth 2 (two) times daily with a meal. Patient taking differently: Take 25 mg by mouth daily.  07/04/13  Yes Vernie Shanks, MD  citalopram (CELEXA) 40 MG tablet Take 1 tablet (40 mg total) by mouth daily. 03/30/14  Yes Mary-Margaret Hassell Done, FNP  ferrous sulfate 325 (65 FE) MG tablet TAKE ONE TABLET BY MOUTH ONCE DAILY WITH BREAKFAST 03/15/14  Yes Sharion Balloon, FNP  Fluticasone Furoate-Vilanterol (BREO ELLIPTA) 100-25 MCG/INH AEPB Inhale 1 puff into the lungs 2 (two) times daily.   Yes Historical Provider, MD  gabapentin (NEURONTIN) 800 MG tablet TAKE ONE TABLET BY MOUTH THREE TIMES DAILY 03/15/14  Yes Mary-Margaret Hassell Done, FNP  insulin aspart (NOVOLOG FLEXPEN) 100 UNIT/ML FlexPen Inject 45 Units into the skin 3 (three) times daily with meals. 06/06/14  Yes Mary-Margaret Hassell Done, FNP  Insulin Glargine (LANTUS SOLOSTAR) 100 UNIT/ML Solostar Pen Inject 65 units SQ prior to breakfast and 40 units prior to evening meal 01/06/14  Yes Tammy Eckard, PHARMD  levothyroxine (SYNTHROID, LEVOTHROID) 50 MCG tablet Take 1 tablet (50 mcg total) by mouth every evening. 03/30/14  Yes Mary-Margaret Hassell Done, FNP  linagliptin (TRADJENTA) 5 MG TABS tablet Take 1 tablet (5 mg total) by mouth daily. 03/30/14  Yes Mary-Margaret Hassell Done, FNP  losartan (COZAAR) 50 MG tablet Take 50 mg by mouth daily.  06/13/13  Yes Historical Provider, MD  mupirocin ointment (BACTROBAN) 2 % Apply 1 application topically 2  (two) times daily. 05/03/14  Yes Wardell Honour, MD  nitroGLYCERIN (NITROSTAT) 0.4 MG SL tablet Place 1 tablet (0.4 mg total) under the tongue every 5 (five) minutes as needed for chest pain. 09/30/13  Yes Wardell Honour, MD  omeprazole (PRILOSEC) 20 MG capsule Take 1 capsule (20 mg total) by mouth daily. 03/30/14  Yes Mary-Margaret Hassell Done, FNP  potassium chloride (K-DUR,KLOR-CON) 10 MEQ tablet Take 1 tablet (10 mEq total) by mouth 2 (two) times daily. 03/30/14  Yes Mary-Margaret Hassell Done, FNP  pravastatin (PRAVACHOL) 40 MG tablet Take 1 tablet (40 mg total) by mouth at bedtime. 05/22/14  Yes Mary-Margaret Hassell Done, FNP  rOPINIRole (REQUIP) 1 MG tablet Take 1 tablet (1 mg total) by mouth at bedtime. 07/04/13  Yes Vernie Shanks, MD  sodium bicarbonate 650 MG tablet Take 1 tablet (650 mg total) by mouth 3 (three) times daily. 03/30/14  Yes Mary-Margaret Hassell Done, FNP  tiotropium (SPIRIVA HANDIHALER) 18 MCG inhalation capsule Place 1 capsule (18 mcg total) into inhaler and inhale every morning. 03/30/14  Yes Mary-Margaret Hassell Done, FNP  torsemide (DEMADEX) 20 MG tablet Take 80 mg by mouth daily.   Yes Historical Provider, MD  doxycycline (VIBRA-TABS) 100 MG tablet Take 1 tablet (100 mg total) by mouth 2 (two) times daily. Patient not taking: Reported on 06/29/2014 06/28/14   Wardell Honour, MD  HYDROcodone-acetaminophen [facility]) 10-325 MG per tablet Take 1 tablet by mouth every 6 (six) hours as needed. Patient not taking: Reported on 06/29/2014 06/28/14   Wardell Honour, MD  ondansetron (ZOFRAN ODT) 8 MG disintegrating tablet 8mg  ODT q4 hours prn nausea 06/29/14   Nat Christen, MD   Triage Vitals: BP 145/65 mmHg  Pulse 89  Temp(Src) 99.5 F (37.5 C) (Oral)  Resp 22  Ht 5\' 7"  (1.702 m)  Wt 252 lb (114.306 kg)  BMI 39.46 kg/m2  SpO2 90%  Physical Exam  Constitutional: She is oriented to person, place, and time. She appears well-developed and well-nourished.  HENT:  Head: Normocephalic and atraumatic.  Eyes:  Conjunctivae and EOM are normal. Pupils are equal, round, and reactive to light.  Neck: Normal range of motion. Neck supple.  Cardiovascular: Normal rate and regular rhythm.   Pulmonary/Chest: Effort normal and breath sounds normal.  Abdominal: Soft. Bowel sounds are normal.  Musculoskeletal: Normal range of motion.  Neurological: She is alert and oriented to person, place, and time.  Skin: Skin is warm and dry.  Psychiatric: She has a normal mood and affect. Her behavior is normal.  Nursing note and vitals reviewed.   ED Course  Procedures (including critical care time)  DIAGNOSTIC STUDIES: Oxygen Saturation is 90% on room air, low by my interpretation.    COORDINATION OF CARE: 8:12 AM- Will start an IV and administer nausea medication in the ED.  The patient agreed to the treatment plan.   Labs Review Labs Reviewed  COMPREHENSIVE METABOLIC PANEL - Abnormal; Notable for the following:    Sodium 133 (*)    Potassium 3.3 (*)    Chloride 91 (*)    Glucose, Bld 281 (*)    Creatinine, Ser 2.12 (*)    Calcium 8.0 (*)    Albumin 3.0 (*)    GFR calc non Af Amer 23 (*)    GFR calc Af Amer 27 (*)    All other components within normal limits  CBC WITH DIFFERENTIAL/PLATELET - Abnormal; Notable for the following:    Monocytes Relative 13 (*)    All other components within normal limits  CBG MONITORING, ED - Abnormal; Notable for the following:    Glucose-Capillary 261 (*)    All other components within normal limits  CBG MONITORING, ED    Imaging Review Dg Chest 2 View  06/29/2014   CLINICAL DATA:  Cough and congestion for 2-3 days.  Emphysema.  EXAM: CHEST  2 VIEW  COMPARISON:  PA and lateral chest 10/22/2013.  FINDINGS: There is chronic, mild peribronchial thickening. No consolidative process, pneumothorax or effusion is identified. Heart size is mildly enlarged. Chronic, mild elevation the right hemidiaphragm relative to the left is unchanged.  IMPRESSION: No acute finding.   Chronic bronchitic change.   Electronically Signed   By: Inge Rise M.D.   On: 06/29/2014 08:02   US Venous Img Upper Uni Left  06/29/2014   CLINICAL DATA:  Left upper extremity swelling for 2 weeks.  EXAM: Left UPPER EXTREMITY VENOUS DOPPLER ULTRASOUND  TECHNIQUE: Gray-scale sonography with graded compression, as well as color Doppler and duplex ultrasound were performed to evaluate the upper extremity deep venous system from the level of the subclavian vein and including the jugular, axillary, basilic, radial, ulnar and upper cephalic vein. Spectral Doppler was utilized to evaluate flow at rest and with distal augmentation maneuvers.  COMPARISON:  None.  FINDINGS: Contralateral Subclavian Vein: Respiratory phasicity is normal and symmetric with the symptomatic side. No evidence of thrombus. Normal compressibility.  Internal Jugular Vein: No evidence of thrombus. Normal compressibility, respiratory phasicity and response to augmentation.  Subclavian Vein: No evidence of thrombus. Normal compressibility, respiratory phasicity and response to augmentation.  Axillary Vein: No evidence of thrombus. Normal compressibility, respiratory phasicity and response to augmentation.  Cephalic Vein: Partial compressibility of this vein in the region of the antecubital fossa is noted with flow consistent with nonocclusive thrombus.  Basilic Vein: No evidence of thrombus. Normal compressibility, respiratory phasicity and response to augmentation.  Brachial Veins: No evidence of thrombus. Normal compressibility, respiratory phasicity and response to augmentation.  Radial Veins: No evidence of thrombus. Normal compressibility,  respiratory phasicity and response to augmentation.  Ulnar Veins: No evidence of thrombus. Normal compressibility, respiratory phasicity and response to augmentation.  Venous Reflux:  None visualized.  Other Findings: Patient reportedly has fistula in the region of the left antecubital fossa.   IMPRESSION: Nonocclusive thrombus is noted in the left cephalic vein in the antecubital fossa. No other thrombus is noted in the left upper extremity.   Electronically Signed   By: Marijo Conception, M.D.   On: 06/29/2014 13:00     EKG Interpretation   Date/Time:  Thursday June 29 2014 07:35:33 EDT Ventricular Rate:  90 PR Interval:  131 QRS Duration: 97 QT Interval:  376 QTC Calculation: 460 R Axis:   54 Text Interpretation:  Sinus rhythm Nonspecific T abnormalities, lateral  leads Baseline wander in lead(s) I II aVR aVL aVF V1 Confirmed by Kayla Weekes   MD, Manisha Cancel ([id number]) on 06/29/2014 8:32:00 AM      MDM   Final diagnoses:  Gastroenteritis  Superficial thrombophlebitis   Patient feels better after IV fluids and IV Zofran, IV Protonix. Doppler study of left upper extremity shows nonocclusive thrombus in the left cephalic vein. Patient's hemodynamic be stable. Discharge medications Zofran 8 mg ODT and aspirin  I personally performed the services described in this documentation, which was scribed in my presence. The recorded information has been reviewed and is accurate.    Nat Christen, MD 06/29/14 1340

## 2014-06-29 NOTE — Discharge Instructions (Signed)
Medication for nausea. Test of your left arm shows a superficial phlebitis. Recommend moist heat and one baby aspirin a day.

## 2014-06-29 NOTE — ED Notes (Signed)
Pt back from XR 

## 2014-06-29 NOTE — H&P (Addendum)
PCP:   Chevis Pretty, FNP   Chief Complaint:  Chest pain  HPI:  64 year old female who  has a past medical history of Chest pain (2003); RLS (restless legs syndrome); GERD (gastroesophageal reflux disease); GI bleeding; Obese; Gout; Hypothyroidism; Low back pain; Cellulitis; Diabetes mellitus (1998); Hyperlipidemia; Hypertension (2002); Anemia; Hyperkalemia; Irregular heart beat; PONV (postoperative nausea and vomiting); Myocardial infarction; CAD (coronary artery disease); Depression; Headache(784.0); Vitiligo; H/O hiatal hernia; Arthritis; Cancer; Anginal pain; Dysrhythmia; COPD (chronic obstructive pulmonary disease); Chronic kidney disease, stage III (moderate); Renal insufficiency; and Neuropathy. Today came to the ED with chief complaint of chest pain and shortness of breath started today. Earlier in the afternoon patient was seen in the ED at that time she was found to have nonocclusive cephalic thrombus on the ultrasound of the left upper extremity. Patient says that after she went home from the ED she started having chest pain at nighttime which was associated with shortness of breath. The chest pain was in the left anterior chest wall. Pain has resolved at this time. Patient has history of C KD stage IV, so a CT angiogram could not be performed. A VQ scan has been ordered to rule out pulmonary embolism. Patient has received 1 dose of Lovenox in the ED. She has a history of COPD currently not on home oxygen, she has been experiencing shortness of breath on exertion. Also has been coughing up green-colored phlegm.  Allergies:   Allergies  Allergen Reactions  . Latex Shortness Of Breath and Swelling    Facial swelling  . Vancomycin Shortness Of Breath  . Vancomycin Hcl Swelling    REACTION: facial swelling  . Adhesive [Tape] Rash    Plastic tape pulls her skin off when it is removed. Paper tape is ok per patient.  . Quinapril Hcl Swelling and Rash    Arm swelling        Past Medical History  Diagnosis Date  . Chest pain 2003    ?h/o MI, syncope; 2003 stress nuclear-inferoseptal scar, EF-54%; 2007-MI ruled out, echocardiogram-normal LV size, wall thickness and function  . RLS (restless legs syndrome)     sleep study in 2010: No apnea; moderate periodic movements  . GERD (gastroesophageal reflux disease)     H/o upper GI bleed  . GI bleeding   . Obese   . Gout   . Hypothyroidism   . Low back pain   . Cellulitis     Neck  . Diabetes mellitus 1998    A1c-7.1 in 2010, 6.7 in 2013  . Hyperlipidemia     Fasting lipids 2013:158, 152, 35, 93  . Hypertension 2002  . Anemia   . Hyperkalemia   . Irregular heart beat   . PONV (postoperative nausea and vomiting)   . Myocardial infarction   . CAD (coronary artery disease)     dr. Lattie Haw (Linna Hoff)  . Depression     Insomnia  . Headache(784.0)   . Vitiligo   . H/O hiatal hernia   . Arthritis   . Cancer     HX SKIN CANCER  . Anginal pain     last cp few months ago.  Marland Kitchen Dysrhythmia     "skips a beat"  . COPD (chronic obstructive pulmonary disease)   . Chronic kidney disease, stage III (moderate)     Creat-1.9 in 04/2008, 2.65 in 02/2010(noHD  . Renal insufficiency   . Neuropathy     Past Surgical History  Procedure Laterality Date  . Appendectomy    .  Carpal tunnel release  1992, 1993, 2011    Right in 2011; previously bilateral  . Tubal ligation  1976  . Partial hysterectomy  1985    Vaginal  . Shoulder surgery  1997    Left  . Incise and drain abcess  2010    Neck-MRSA  . Colonoscopy  02/2002, 07/2009    few diverticula, otherwise negative  . Foot surgery  1984    Bilateral heel spurs  . Abdominal hysterectomy    . Shoulder surgery      RIGHT  ADJUSTED   . Av fistula placement Left 07/16/2012    Procedure: ARTERIOVENOUS (AV) FISTULA CREATION;  Surgeon: Mal Misty, MD;  Location: [place];  Service: Vascular;  Laterality: Left;  . Av fistula placement Left 07/29/2012    Procedure:  ARTERIOVENOUS (AV) FISTULA CREATION;  Surgeon: Mal Misty, MD;  Location: [place];  Service: Vascular;  Laterality: Left;  Left Brachial-Cephalic Arteriovenous Fistula Creation  . Colonoscopy with esophagogastroduodenoscopy (egd) N/A 08/27/2012    RMR: Normal esophagus s/p 54 F dilator. Small hiatal hernia. Gastric polyp removed, oozing (hyperplastic), which could explain heme positive stool:Colonic diverticulosis  . Revison of arteriovenous fistula Left 08/03/2013    Procedure: BANDING OF BRACHIOCEPHALIC ARTERIOVENOUS FISTULA;  Surgeon: Conrad Earth, MD;  Location: [place];  Service: Vascular;  Laterality: Left;  . Thrombectomy w/ embolectomy Left 08/03/2013    Procedure: THROMBECTOMY OF ARTERIOVENOUS FISTULA  AND REVISION OF  ARTERIOVENOUS FISTULA BANDING.;  Surgeon: Conrad Catoosa, MD;  Location: [place];  Service: Vascular;  Laterality: Left;  . Esophagogastroduodenoscopy N/A 10/27/2013    Dr.Rourk- esophageal mucosa appeared normal. tubular esophagus widely patent throughout its course. stomach empty. patient had longitudinal proximal gastric ersions overlying multiple proximal folds. nodular mucosa with overlying antral erosions as well. no ulcer or infiltrating process seen patent pylorus, normal first and second portion of the duodenum. bx= reactive gastropathy  . Savory dilation N/A 10/27/2013    Procedure: SAVORY DILATION;  Surgeon: Daneil Dolin, MD;  [facility];  Service: Endoscopy;  Laterality: N/A;  Venia Minks dilation N/A 10/27/2013    Procedure: Venia Minks DILATION;  Surgeon: Daneil Dolin, MD;  [facility];  Service: Endoscopy;  Laterality: N/A;  . Shuntogram Left 08/01/2013    Procedure: FISTULOGRAM;  Surgeon: Conrad Lawrenceburg, MD;  Location: [facility];  Service: Cardiovascular;  Laterality: Left;    Prior to Admission medications   Medication Sig Start Date End Date Taking? Authorizing Provider  albuterol (PROAIR HFA) 108 (90 BASE) MCG/ACT inhaler Inhale 2 puffs into the  lungs every 6 (six) hours as needed for wheezing or shortness of breath. 07/04/13  Yes Vernie Shanks, MD  allopurinol (ZYLOPRIM) 100 MG tablet Take 1 tablet (100 mg total) by mouth every morning. 07/04/13  Yes Vernie Shanks, MD  amLODipine (NORVASC) 5 MG tablet TAKE ONE TABLET BY MOUTH ONCE DAILY IN THE EVENING 05/01/14  Yes Mary-Margaret Hassell Done, FNP  carvedilol (COREG) 25 MG tablet Take 1 tablet (25 mg total) by mouth 2 (two) times daily with a meal. Patient taking differently: Take 25 mg by mouth daily.  07/04/13  Yes Vernie Shanks, MD  citalopram (CELEXA) 40 MG tablet Take 1 tablet (40 mg total) by mouth daily. 03/30/14  Yes Mary-Margaret Hassell Done, FNP  ferrous sulfate 325 (65 FE) MG tablet TAKE ONE TABLET BY MOUTH ONCE DAILY WITH BREAKFAST 03/15/14  Yes Sharion Balloon, FNP  Fluticasone Furoate-Vilanterol (BREO  ELLIPTA) 100-25 MCG/INH AEPB Inhale 1 puff into the lungs 2 (two) times daily.   Yes Historical Provider, MD  gabapentin (NEURONTIN) 800 MG tablet TAKE ONE TABLET BY MOUTH THREE TIMES DAILY Patient taking differently: TAKE THREE  TABLET BY MOUTH DAILY AT BEDTIME 03/15/14  Yes Mary-Margaret Hassell Done, FNP  insulin aspart (NOVOLOG FLEXPEN) 100 UNIT/ML FlexPen Inject 45 Units into the skin 3 (three) times daily with meals. 06/06/14  Yes Mary-Margaret Hassell Done, FNP  Insulin Glargine (LANTUS SOLOSTAR) 100 UNIT/ML Solostar Pen Inject 65 units SQ prior to breakfast and 40 units prior to evening meal Patient taking differently: Inject 45-60 Units into the skin 2 (two) times daily. Inject 60 units SQ prior to breakfast and 45 units prior to evening meal 01/06/14  Yes Tammy Eckard, PHARMD  levothyroxine (SYNTHROID, LEVOTHROID) 50 MCG tablet Take 1 tablet (50 mcg total) by mouth every evening. 03/30/14  Yes Mary-Margaret Hassell Done, FNP  linagliptin (TRADJENTA) 5 MG TABS tablet Take 1 tablet (5 mg total) by mouth daily. 03/30/14  Yes Mary-Margaret Hassell Done, FNP  losartan (COZAAR) 50 MG tablet Take 50 mg by mouth daily.   06/13/13  Yes Historical Provider, MD  mupirocin ointment (BACTROBAN) 2 % Apply 1 application topically 2 (two) times daily. 05/03/14  Yes Wardell Honour, MD  nitroGLYCERIN (NITROSTAT) 0.4 MG SL tablet Place 1 tablet (0.4 mg total) under the tongue every 5 (five) minutes as needed for chest pain. 09/30/13  Yes Wardell Honour, MD  omeprazole (PRILOSEC) 20 MG capsule Take 1 capsule (20 mg total) by mouth daily. 03/30/14  Yes Mary-Margaret Hassell Done, FNP  potassium chloride (K-DUR,KLOR-CON) 10 MEQ tablet Take 1 tablet (10 mEq total) by mouth 2 (two) times daily. 03/30/14  Yes Mary-Margaret Hassell Done, FNP  pravastatin (PRAVACHOL) 40 MG tablet Take 1 tablet (40 mg total) by mouth at bedtime. 05/22/14  Yes Mary-Margaret Hassell Done, FNP  rOPINIRole (REQUIP) 1 MG tablet Take 1 tablet (1 mg total) by mouth at bedtime. 07/04/13  Yes Vernie Shanks, MD  sodium bicarbonate 650 MG tablet Take 1 tablet (650 mg total) by mouth 3 (three) times daily. Patient taking differently: Take 650 mg by mouth 2 (two) times daily.  03/30/14  Yes Mary-Margaret Hassell Done, FNP  tiotropium (SPIRIVA HANDIHALER) 18 MCG inhalation capsule Place 1 capsule (18 mcg total) into inhaler and inhale every morning. 03/30/14  Yes Mary-Margaret Hassell Done, FNP  torsemide (DEMADEX) 20 MG tablet Take 40 mg by mouth 2 (two) times daily.    Yes Historical Provider, MD  doxycycline (VIBRA-TABS) 100 MG tablet Take 1 tablet (100 mg total) by mouth 2 (two) times daily. Patient not taking: Reported on 06/29/2014 06/28/14   Wardell Honour, MD  HYDROcodone-acetaminophen [facility]) 10-325 MG per tablet Take 1 tablet by mouth every 6 (six) hours as needed. Patient not taking: Reported on 06/29/2014 06/28/14   Wardell Honour, MD  ondansetron (ZOFRAN ODT) 8 MG disintegrating tablet 8mg  ODT q4 hours prn nausea Patient taking differently: Take 8 mg by mouth every 4 (four) hours as needed. 8mg  ODT q4 hours prn nausea 06/29/14   Nat Christen, MD  sulfamethoxazole-trimethoprim [facility]) 800-160 MG  per tablet Take 1 tablet by mouth 2 (two) times daily. 06/29/14   Nat Christen, MD    Social History:  reports that she has never smoked. She has never used smokeless tobacco. She reports that she does not drink alcohol or use illicit drugs.  Family History  Problem Relation Age of Onset  . Emphysema Mother   .  COPD Mother   . Colon cancer Father   . Diabetes Father   . Cancer Father     colon  . Hypertension Father   . Congestive Heart Failure Maternal Grandmother   . Cancer Paternal Grandmother   . Rheum arthritis Sister   . Cancer Brother 108    colon CA     All the positives are listed in BOLD  Review of Systems:  HEENT: Headache, blurred vision, runny nose, sore throat Neck: Hypothyroidism, hyperthyroidism,,lymphadenopathy Chest : Shortness of breath, history of COPD, Asthma Heart : Chest pain, history of coronary arterey disease GI:  Nausea, vomiting, diarrhea, constipation, GERD GU: Dysuria, urgency, frequency of urination, hematuria Neuro: Stroke, seizures, syncope Psych: Depression, anxiety, hallucinations   Physical Exam: Blood pressure 130/59, pulse 81, temperature 99.3 F (37.4 C), temperature source Oral, resp. rate 22, height 5\' 7"  (1.702 m), weight 114.306 kg (252 lb), SpO2 94 %. Constitutional:   Patient is a well-developed and well-nourished female* in no acute distress and cooperative with exam. Head: Normocephalic and atraumatic Mouth: Mucus membranes moist Eyes: PERRL, EOMI, conjunctivae normal Neck: Supple, No Thyromegaly Cardiovascular: RRR, S1 normal, S2 normal Pulmonary/Chest: CTAB, no wheezes, rales, or rhonchi Abdominal: Soft. Non-tender, non-distended, bowel sounds are normal, no masses, organomegaly, or guarding present.  Neurological: A&O x3, Strength is normal and symmetric bilaterally, cranial nerve II-XII are grossly intact, no focal motor deficit, sensory intact to light touch bilaterally.  Extremities :  B/L 2 + pitting edema of the lower  extremities  Labs on Admission:  Basic Metabolic Panel:  Recent Labs Lab 06/29/14 0746 06/29/14 2147  NA 133* 132*  K 3.3* 3.6  CL 91* 92*  CO2 30 30  GLUCOSE 281* 434*  BUN 18 18  CREATININE 2.12* 2.20*  CALCIUM 8.0* 7.8*   Liver Function Tests:  Recent Labs Lab 06/29/14 0746  AST 22  ALT 29  ALKPHOS 78  BILITOT 0.7  PROT 6.4  ALBUMIN 3.0*   No results for input(s): LIPASE, AMYLASE in the last 168 hours. No results for input(s): AMMONIA in the last 168 hours. CBC:  Recent Labs Lab 06/29/14 0746 06/29/14 2147  WBC 7.9 6.3  NEUTROABS 4.6 3.4  HGB 12.2 12.3  HCT 37.7 37.8  MCV 91.1 92.0  PLT 247 260   Cardiac Enzymes:  Recent Labs Lab 06/29/14 2147  TROPONINI 0.09*    BNP (last 3 results) No results for input(s): BNP in the last 8760 hours.  ProBNP (last 3 results)  Recent Labs  10/22/13 1827  PROBNP 2668.0*    CBG:  Recent Labs Lab 06/29/14 1318  GLUCAP 261*    Radiological Exams on Admission: Dg Chest 2 View  06/29/2014   CLINICAL DATA:  Cough and congestion for 2-3 days.  Emphysema.  EXAM: CHEST  2 VIEW  COMPARISON:  PA and lateral chest 10/22/2013.  FINDINGS: There is chronic, mild peribronchial thickening. No consolidative process, pneumothorax or effusion is identified. Heart size is mildly enlarged. Chronic, mild elevation the right hemidiaphragm relative to the left is unchanged.  IMPRESSION: No acute finding.  Chronic bronchitic change.   Electronically Signed   By: Inge Rise M.D.   On: 06/29/2014 08:02   US Venous Img Upper Uni Left  06/29/2014   CLINICAL DATA:  Left upper extremity swelling for 2 weeks.  EXAM: Left UPPER EXTREMITY VENOUS DOPPLER ULTRASOUND  TECHNIQUE: Gray-scale sonography with graded compression, as well as color Doppler and duplex ultrasound were performed to evaluate the upper extremity  deep venous system from the level of the subclavian vein and including the jugular, axillary, basilic, radial, ulnar and  upper cephalic vein. Spectral Doppler was utilized to evaluate flow at rest and with distal augmentation maneuvers.  COMPARISON:  None.  FINDINGS: Contralateral Subclavian Vein: Respiratory phasicity is normal and symmetric with the symptomatic side. No evidence of thrombus. Normal compressibility.  Internal Jugular Vein: No evidence of thrombus. Normal compressibility, respiratory phasicity and response to augmentation.  Subclavian Vein: No evidence of thrombus. Normal compressibility, respiratory phasicity and response to augmentation.  Axillary Vein: No evidence of thrombus. Normal compressibility, respiratory phasicity and response to augmentation.  Cephalic Vein: Partial compressibility of this vein in the region of the antecubital fossa is noted with flow consistent with nonocclusive thrombus.  Basilic Vein: No evidence of thrombus. Normal compressibility, respiratory phasicity and response to augmentation.  Brachial Veins: No evidence of thrombus. Normal compressibility, respiratory phasicity and response to augmentation.  Radial Veins: No evidence of thrombus. Normal compressibility, respiratory phasicity and response to augmentation.  Ulnar Veins: No evidence of thrombus. Normal compressibility, respiratory phasicity and response to augmentation.  Venous Reflux:  None visualized.  Other Findings: Patient reportedly has fistula in the region of the left antecubital fossa.  IMPRESSION: Nonocclusive thrombus is noted in the left cephalic vein in the antecubital fossa. No other thrombus is noted in the left upper extremity.   Electronically Signed   By: Marijo Conception, M.D.   On: 06/29/2014 13:00    EKG: Independently reviewed. Normal sinus rhythm   Assessment/Plan Active Problems:   Hypothyroidism   Hypertension   Chest pain   Chronic kidney disease, stage IV (severe)   DM type 2, uncontrolled, with neuropathy   Dyslipidemia associated with type 2 diabetes mellitus  Chest pain We'll admit the  patient to rule out acute coronary syndrome. She has mild elevation of troponin 0.09, will obtain serial cardiac enzymes. Patient received 1 dose of therapeutic Lovenox in the ED. We'll also obtain cardiac consultation in a.m. continue Coreg.  Elevated d-dimer Patient has elevated d-dimer, which could be due to the left cephalic vein thrombosis. Due to chest pain and shortness of breath there is a concern for possible pulmonary embolism, CT angiogram could not be performed due to the CK D. VQ scan has been ordered. Patient received 1 dose of Lovenox therapeutic in the ED. Follow VQ scan results in a.m.  Diabetes mellitus Patient takes NovoLog 45 units 3 times a day at home with meals, will start the same dose in the hospital. Also start sliding scale insulin with NovoLog.  CKD stage IV Patient has C KD stage IV, she has fistula in the left arm in place. Not started yet on hemodialysis.  COPD Patient came with shortness of breath. We'll start DuoNeb nebulizers every 6 hours. No active wheezing auscultated at this time. We will also start Levaquin per pharmacy consultation for bronchitis. As patient has been coughing up green-colored phlegm  Hypothyroidism Continue Synthroid   Code status: Full code  Family discussion: No family at bedside   Time Spent on Admission: 27 minutes  Eivan Gallina S [facility] Pager: [phone] 06/30/2014, 12:05 AM  If 7PM-7AM, please contact night-coverage  [email or web address]  Password [email or web address]

## 2014-06-29 NOTE — ED Provider Notes (Signed)
11:10 PM  Assumed care from Dr. Eulis Foster.  Pt is a 64 y.o. female with morbid obesity, hypertension, COPD not on oxygen, chronic kidney disease who is not yet on dialysis who presents with left sided chest heaviness and shortness of breath that started today. Was seen in the emergency department earlier today. Had an ultrasound of her upper extremity which did show a nonocclusive cephalic thrombus. Her labs here show chronic kidney disease which is stable. Troponin is mildly elevated at 0.09 but this is in the setting of chronic kidney disease. She has an elevated d-dimer 1.95. I have discussed with radiology technician that I would like a VQ scan performed tonight. She is unable to obtain a CT angioma given her chronic kidney disease. Will give dose of therapeutic Lovenox in the ED. Discussed with Dr. Darrick Meigs with hospitalist service who agrees with this plan. Have also given patient aspirin. She is not having chest pain currently. She is hemodynamically stable. Will admit to telemetry, observation.  [place], DO 06/29/14 2317

## 2014-06-29 NOTE — ED Notes (Signed)
Patient with no complaints at this time. Respirations even and unlabored. Skin warm/dry. Discharge instructions reviewed with patient at this time. Patient given opportunity to voice concerns/ask questions. IV removed per policy and band-aid applied to site. Patient discharged at this time and left Emergency Department with steady gait.  

## 2014-06-30 ENCOUNTER — Encounter (HOSPITAL_COMMUNITY): Payer: Self-pay | Admitting: *Deleted

## 2014-06-30 ENCOUNTER — Observation Stay (HOSPITAL_COMMUNITY): Payer: Commercial Managed Care - HMO

## 2014-06-30 DIAGNOSIS — E1169 Type 2 diabetes mellitus with other specified complication: Secondary | ICD-10-CM

## 2014-06-30 DIAGNOSIS — E785 Hyperlipidemia, unspecified: Secondary | ICD-10-CM

## 2014-06-30 DIAGNOSIS — N184 Chronic kidney disease, stage 4 (severe): Secondary | ICD-10-CM

## 2014-06-30 DIAGNOSIS — R079 Chest pain, unspecified: Secondary | ICD-10-CM

## 2014-06-30 DIAGNOSIS — I10 Essential (primary) hypertension: Secondary | ICD-10-CM

## 2014-06-30 DIAGNOSIS — E1140 Type 2 diabetes mellitus with diabetic neuropathy, unspecified: Secondary | ICD-10-CM

## 2014-06-30 DIAGNOSIS — E039 Hypothyroidism, unspecified: Secondary | ICD-10-CM

## 2014-06-30 DIAGNOSIS — E1165 Type 2 diabetes mellitus with hyperglycemia: Secondary | ICD-10-CM

## 2014-06-30 LAB — COMPREHENSIVE METABOLIC PANEL
ALBUMIN: 3 g/dL — AB (ref 3.5–5.2)
ALT: 27 U/L (ref 0–35)
ANION GAP: 12 (ref 5–15)
AST: 19 U/L (ref 0–37)
Alkaline Phosphatase: 72 U/L (ref 39–117)
BUN: 17 mg/dL (ref 6–23)
CALCIUM: 8 mg/dL — AB (ref 8.4–10.5)
CO2: 31 mmol/L (ref 19–32)
CREATININE: 2.09 mg/dL — AB (ref 0.50–1.10)
Chloride: 95 mmol/L — ABNORMAL LOW (ref 96–112)
GFR calc Af Amer: 27 mL/min — ABNORMAL LOW (ref >90)
GFR, EST NON AFRICAN AMERICAN: 24 mL/min — AB (ref >90)
Glucose, Bld: 346 mg/dL — ABNORMAL HIGH (ref 70–99)
Potassium: 3.4 mmol/L — ABNORMAL LOW (ref 3.5–5.1)
Sodium: 138 mmol/L (ref 135–145)
Total Bilirubin: 0.5 mg/dL (ref 0.3–1.2)
Total Protein: 6.3 g/dL (ref 6.0–8.3)

## 2014-06-30 LAB — CBC
HEMATOCRIT: 36.7 % (ref 36.0–46.0)
Hemoglobin: 11.8 g/dL — ABNORMAL LOW (ref 12.0–15.0)
MCH: 29.5 pg (ref 26.0–34.0)
MCHC: 32.2 g/dL (ref 30.0–36.0)
MCV: 91.8 fL (ref 78.0–100.0)
Platelets: 238 10*3/uL (ref 150–400)
RBC: 4 MIL/uL (ref 3.87–5.11)
RDW: 14.6 % (ref 11.5–15.5)
WBC: 6 10*3/uL (ref 4.0–10.5)

## 2014-06-30 LAB — GLUCOSE, CAPILLARY
Glucose-Capillary: 400 mg/dL — ABNORMAL HIGH (ref 70–99)
Glucose-Capillary: 366 mg/dL — ABNORMAL HIGH (ref 70–99)
Glucose-Capillary: 362 mg/dL — ABNORMAL HIGH (ref 70–99)
Glucose-Capillary: 409 mg/dL — ABNORMAL HIGH (ref 70–99)
Glucose-Capillary: 296 mg/dL — ABNORMAL HIGH (ref 70–99)

## 2014-06-30 LAB — MRSA PCR SCREENING: MRSA BY PCR: NEGATIVE

## 2014-06-30 LAB — TROPONIN I
TROPONIN I: 0.16 ng/mL — AB (ref <0.031)
TROPONIN I: 0.19 ng/mL — AB (ref <0.031)
Troponin I: 0.11 ng/mL — ABNORMAL HIGH (ref <0.031)

## 2014-06-30 MED ORDER — ONDANSETRON HCL 4 MG/2ML IJ SOLN: 4.0000 mg | Freq: Four times a day (QID) | INTRAMUSCULAR | Status: DC | PRN

## 2014-06-30 MED ORDER — MUPIROCIN 2 % EX OINT
1.0000 "application " | TOPICAL_OINTMENT | Freq: Two times a day (BID) | CUTANEOUS | Status: DC
  Administered 2014-06-30 – 2014-07-04 (×10): 1 via TOPICAL
  Filled 2014-06-30 (×2): qty 22

## 2014-06-30 MED ORDER — DOXYCYCLINE HYCLATE 100 MG PO TABS
100.0000 mg | ORAL_TABLET | Freq: Two times a day (BID) | ORAL | Status: DC
  Administered 2014-06-30 – 2014-07-04 (×9): 100 mg via ORAL
  Filled 2014-06-30 (×9): qty 1

## 2014-06-30 MED ORDER — TECHNETIUM TO 99M ALBUMIN AGGREGATED
6.0000 | Freq: Once | INTRAVENOUS | Status: AC | PRN
  Administered 2014-06-30: 6 via INTRAVENOUS

## 2014-06-30 MED ORDER — LEVOTHYROXINE SODIUM 50 MCG PO TABS
50.0000 ug | ORAL_TABLET | Freq: Every evening | ORAL | Status: DC
  Administered 2014-06-30 – 2014-07-03 (×4): 50 ug via ORAL
  Filled 2014-06-30 (×4): qty 1

## 2014-06-30 MED ORDER — ONDANSETRON HCL 4 MG PO TABS: 4.0000 mg | ORAL_TABLET | Freq: Four times a day (QID) | ORAL | Status: DC | PRN

## 2014-06-30 MED ORDER — IPRATROPIUM-ALBUTEROL 0.5-2.5 (3) MG/3ML IN SOLN
3.0000 mL | Freq: Four times a day (QID) | RESPIRATORY_TRACT | Status: DC
  Administered 2014-06-30 (×3): 3 mL via RESPIRATORY_TRACT
  Filled 2014-06-30 (×3): qty 3

## 2014-06-30 MED ORDER — AMLODIPINE BESYLATE 5 MG PO TABS
5.0000 mg | ORAL_TABLET | Freq: Every day | ORAL | Status: DC
  Administered 2014-06-30 – 2014-07-04 (×5): 5 mg via ORAL
  Filled 2014-06-30 (×5): qty 1

## 2014-06-30 MED ORDER — CITALOPRAM HYDROBROMIDE 20 MG PO TABS
40.0000 mg | ORAL_TABLET | Freq: Every day | ORAL | Status: DC
  Administered 2014-06-30 – 2014-07-04 (×5): 40 mg via ORAL
  Filled 2014-06-30 (×5): qty 2

## 2014-06-30 MED ORDER — PANTOPRAZOLE SODIUM 40 MG PO TBEC
40.0000 mg | DELAYED_RELEASE_TABLET | Freq: Every day | ORAL | Status: DC
Start: 2014-06-30 — End: 2014-07-04
  Administered 2014-06-30 – 2014-07-04 (×5): 40 mg via ORAL
  Filled 2014-06-30 (×5): qty 1

## 2014-06-30 MED ORDER — ROPINIROLE HCL 1 MG PO TABS
1.0000 mg | ORAL_TABLET | Freq: Every day | ORAL | Status: DC
  Administered 2014-06-30 – 2014-07-03 (×5): 1 mg via ORAL
  Filled 2014-06-30 (×6): qty 1

## 2014-06-30 MED ORDER — MORPHINE SULFATE 2 MG/ML IJ SOLN
2.0000 mg | INTRAMUSCULAR | Status: DC | PRN
  Administered 2014-06-30 – 2014-07-03 (×5): 2 mg via INTRAVENOUS
  Filled 2014-06-30 (×6): qty 1

## 2014-06-30 MED ORDER — INSULIN GLARGINE 100 UNIT/ML ~~LOC~~ SOLN
45.0000 [IU] | Freq: Two times a day (BID) | SUBCUTANEOUS | Status: DC
  Administered 2014-06-30 – 2014-07-01 (×3): 45 [IU] via SUBCUTANEOUS
  Filled 2014-06-30 (×7): qty 0.45

## 2014-06-30 MED ORDER — SODIUM CHLORIDE 0.9 % IV SOLN: 250.0000 mL | INTRAVENOUS | Status: DC | PRN

## 2014-06-30 MED ORDER — TECHNETIUM TC 99M DIETHYLENETRIAME-PENTAACETIC ACID
40.0000 | Freq: Once | INTRAVENOUS | Status: AC | PRN
  Administered 2014-06-30: 40 via RESPIRATORY_TRACT

## 2014-06-30 MED ORDER — SODIUM CHLORIDE 0.9 % IJ SOLN: 3.0000 mL | INTRAMUSCULAR | Status: DC | PRN

## 2014-06-30 MED ORDER — GABAPENTIN 800 MG PO TABS
800.0000 mg | ORAL_TABLET | Freq: Three times a day (TID) | ORAL | Status: DC
  Filled 2014-06-30: qty 1

## 2014-06-30 MED ORDER — IPRATROPIUM-ALBUTEROL 0.5-2.5 (3) MG/3ML IN SOLN
3.0000 mL | Freq: Three times a day (TID) | RESPIRATORY_TRACT | Status: DC
  Administered 2014-07-01 – 2014-07-02 (×4): 3 mL via RESPIRATORY_TRACT
  Filled 2014-06-30 (×4): qty 3

## 2014-06-30 MED ORDER — INSULIN ASPART 100 UNIT/ML ~~LOC~~ SOLN
0.0000 [IU] | Freq: Three times a day (TID) | SUBCUTANEOUS | Status: DC
  Administered 2014-06-30 (×2): 20 [IU] via SUBCUTANEOUS
  Administered 2014-07-01: 7 [IU] via SUBCUTANEOUS

## 2014-06-30 MED ORDER — TORSEMIDE 20 MG PO TABS
40.0000 mg | ORAL_TABLET | Freq: Two times a day (BID) | ORAL | Status: DC
  Administered 2014-06-30 – 2014-07-04 (×10): 40 mg via ORAL
  Filled 2014-06-30 (×10): qty 2

## 2014-06-30 MED ORDER — PRAVASTATIN SODIUM 40 MG PO TABS
40.0000 mg | ORAL_TABLET | Freq: Every day | ORAL | Status: DC
  Administered 2014-06-30 – 2014-07-03 (×5): 40 mg via ORAL
  Filled 2014-06-30 (×5): qty 1

## 2014-06-30 MED ORDER — INSULIN ASPART 100 UNIT/ML FLEXPEN
45.0000 [IU] | PEN_INJECTOR | Freq: Three times a day (TID) | SUBCUTANEOUS | Status: DC
  Filled 2014-06-30: qty 3

## 2014-06-30 MED ORDER — ROPINIROLE HCL 1 MG PO TABS
ORAL_TABLET | ORAL | Status: AC
  Filled 2014-06-30: qty 1

## 2014-06-30 MED ORDER — INSULIN ASPART 100 UNIT/ML ~~LOC~~ SOLN
4.0000 [IU] | Freq: Three times a day (TID) | SUBCUTANEOUS | Status: DC
  Administered 2014-06-30 – 2014-07-02 (×8): 4 [IU] via SUBCUTANEOUS

## 2014-06-30 MED ORDER — INSULIN ASPART 100 UNIT/ML ~~LOC~~ SOLN: 45.0000 [IU] | Freq: Three times a day (TID) | SUBCUTANEOUS | Status: DC

## 2014-06-30 MED ORDER — GABAPENTIN 400 MG PO CAPS
800.0000 mg | ORAL_CAPSULE | Freq: Three times a day (TID) | ORAL | Status: DC
  Administered 2014-06-30 – 2014-07-04 (×13): 800 mg via ORAL
  Filled 2014-06-30 (×17): qty 2

## 2014-06-30 MED ORDER — SODIUM CHLORIDE 0.9 % IJ SOLN
3.0000 mL | Freq: Two times a day (BID) | INTRAMUSCULAR | Status: DC
  Administered 2014-06-30 – 2014-07-04 (×9): 3 mL via INTRAVENOUS

## 2014-06-30 MED ORDER — CARVEDILOL 12.5 MG PO TABS
25.0000 mg | ORAL_TABLET | Freq: Two times a day (BID) | ORAL | Status: DC
  Administered 2014-06-30 – 2014-07-04 (×9): 25 mg via ORAL
  Filled 2014-06-30 (×9): qty 2

## 2014-06-30 MED ORDER — INSULIN ASPART 100 UNIT/ML ~~LOC~~ SOLN: 0.0000 [IU] | Freq: Three times a day (TID) | SUBCUTANEOUS | Status: DC

## 2014-06-30 MED ORDER — ASPIRIN 81 MG PO CHEW
81.0000 mg | CHEWABLE_TABLET | Freq: Every day | ORAL | Status: DC
  Administered 2014-06-30 – 2014-07-04 (×5): 81 mg via ORAL
  Filled 2014-06-30 (×5): qty 1

## 2014-06-30 NOTE — Progress Notes (Addendum)
[facility] PROGRESS NOTE  [name] [id number] DOB: [birth year] DOA: 06/29/2014 PCP: Chevis Pretty, FNP  Assessment/Plan: 1-chest pain: atypical story; but some typical features appreciated. Patient also with multiple risks factors. Heart score 4-5 -troponin slightly elevated -2-D echo pending -noa cute ischemia on EKG -will continue ASA, coreg, statins  -cardiology has been consulted will follow rec's  2-type 2 diabetes: with hyperglycemia, neuropathy and chronically on insulin -will continue sSI and lantus -follow A1C -continue Neurontin  3-HLD: continue statins  4- hypothyroidism: continue synthroid  5-GERD: continue PPI  6-depression: continue celexa  7-acute bronchitis/SOB: in patient with chronic emphysema and OHS -will continue PRN O2 supplementation -started on doxycycline -continue duonebs   8-CKD stage 4: Cr at baseline -will follow trend    Code Status: Full Family Communication: no family at bedside Disposition Plan: to be determined; remains inpatient for now.   Consultants:  Cardiology   Procedures:  2-D echo: pending  Antibiotics:  Doxycycline 4/08  HPI/Subjective: Feeling better, no CP, no fever. Reports breathing is easier.  Objective: Filed Vitals:   06/30/14 0633  BP: 160/72  Pulse: 80  Temp: 98.3 F (36.8 C)  Resp: 20    Intake/Output Summary (Last 24 hours) at 06/30/14 1304 Last data filed at 06/30/14 0901  Gross per 24 hour  Intake    240 ml  Output      0 ml  Net    240 ml   Filed Weights   06/29/14 1900  Weight: 114.306 kg (252 lb)    Exam:   General:  Afebrile, currently CP free. Patient reports breathing is also easier  Cardiovascular:S1 and S2, no rubs or gallops  Respiratory: no wheezing, no crackles appreciated; diffuse rhonchi   Abdomen: obese, no guarding, soft; positive BS and no tenderness  Musculoskeletal: trace edema bilaterally on LE; no cyanosis. LUE with good thrill  fistula. Multiple skin scabs and abrasions on both upper extremities.  Data Reviewed: Basic Metabolic Panel:  Recent Labs Lab 06/29/14 0746 06/29/14 2147 06/30/14 0646  NA 133* 132* 138  K 3.3* 3.6 3.4*  CL 91* 92* 95*  CO2 30 30 31   GLUCOSE 281* 434* 346*  BUN 18 18 17   CREATININE 2.12* 2.20* 2.09*  CALCIUM 8.0* 7.8* 8.0*   Liver Function Tests:  Recent Labs Lab 06/29/14 0746 06/30/14 0646  AST 22 19  ALT 29 27  ALKPHOS 78 72  BILITOT 0.7 0.5  PROT 6.4 6.3  ALBUMIN 3.0* 3.0*   CBC:  Recent Labs Lab 06/29/14 0746 06/29/14 2147 06/30/14 0646  WBC 7.9 6.3 6.0  NEUTROABS 4.6 3.4  --   HGB 12.2 12.3 11.8*  HCT 37.7 37.8 36.7  MCV 91.1 92.0 91.8  PLT 247 260 238   Cardiac Enzymes:  Recent Labs Lab 06/29/14 2147 06/30/14 0056 06/30/14 0641  TROPONINI 0.09* 0.11* 0.16*   ProBNP (last 3 results)  Recent Labs  10/22/13 1827  PROBNP 2668.0*    CBG:  Recent Labs Lab 06/29/14 1318 06/30/14 0055 06/30/14 0754 06/30/14 1142  GLUCAP 261* 400* 366* 362*    Recent Results (from the past 240 hour(s))  MRSA PCR Screening     Status: None   Collection Time: 06/30/14  1:10 AM  Result Value Ref Range Status   MRSA by PCR NEGATIVE NEGATIVE Final    Comment:        The GeneXpert MRSA Assay (FDA approved for NASAL specimens only), is one component of a comprehensive MRSA colonization surveillance program. It  is not intended to diagnose MRSA infection nor to guide or monitor treatment for MRSA infections.      Studies: Dg Chest 2 View  06/29/2014   CLINICAL DATA:  Cough and congestion for 2-3 days.  Emphysema.  EXAM: CHEST  2 VIEW  COMPARISON:  PA and lateral chest 10/22/2013.  FINDINGS: There is chronic, mild peribronchial thickening. No consolidative process, pneumothorax or effusion is identified. Heart size is mildly enlarged. Chronic, mild elevation the right hemidiaphragm relative to the left is unchanged.  IMPRESSION: No acute finding.   Chronic bronchitic change.   Electronically Signed   By: Inge Rise M.D.   On: 06/29/2014 08:02   Nm Pulmonary Perf And Vent  06/30/2014   CLINICAL DATA:  Shortness of breath and chest pain. History of emphysema.  EXAM: NUCLEAR MEDICINE VENTILATION - PERFUSION LUNG SCAN  TECHNIQUE: Ventilation images were obtained in multiple projections using inhaled aerosol technetium 99 M DTPA. Perfusion images were obtained in multiple projections after intravenous injection of Tc-29m MAA.  RADIOPHARMACEUTICALS:  40 mCi Tc-16m DTPA aerosol and 6 mCi Tc-27m MAA  COMPARISON:  Chest radiographs 06/29/2014  FINDINGS: Ventilation: Mild heterogeneous distribution tracer activity throughout the lungs compatible with history of emphysema. Pooling of tracer in the central airways.  Perfusion: Normal homogeneous distribution of tracer activity throughout both lungs. No focal defects identified.  IMPRESSION: Low probability of pulmonary embolus.   Electronically Signed   By: Lucienne Capers M.D.   On: 06/30/2014 02:36   US Venous Img Upper Uni Left  06/29/2014   CLINICAL DATA:  Left upper extremity swelling for 2 weeks.  EXAM: Left UPPER EXTREMITY VENOUS DOPPLER ULTRASOUND  TECHNIQUE: Gray-scale sonography with graded compression, as well as color Doppler and duplex ultrasound were performed to evaluate the upper extremity deep venous system from the level of the subclavian vein and including the jugular, axillary, basilic, radial, ulnar and upper cephalic vein. Spectral Doppler was utilized to evaluate flow at rest and with distal augmentation maneuvers.  COMPARISON:  None.  FINDINGS: Contralateral Subclavian Vein: Respiratory phasicity is normal and symmetric with the symptomatic side. No evidence of thrombus. Normal compressibility.  Internal Jugular Vein: No evidence of thrombus. Normal compressibility, respiratory phasicity and response to augmentation.  Subclavian Vein: No evidence of thrombus. Normal compressibility,  respiratory phasicity and response to augmentation.  Axillary Vein: No evidence of thrombus. Normal compressibility, respiratory phasicity and response to augmentation.  Cephalic Vein: Partial compressibility of this vein in the region of the antecubital fossa is noted with flow consistent with nonocclusive thrombus.  Basilic Vein: No evidence of thrombus. Normal compressibility, respiratory phasicity and response to augmentation.  Brachial Veins: No evidence of thrombus. Normal compressibility, respiratory phasicity and response to augmentation.  Radial Veins: No evidence of thrombus. Normal compressibility, respiratory phasicity and response to augmentation.  Ulnar Veins: No evidence of thrombus. Normal compressibility, respiratory phasicity and response to augmentation.  Venous Reflux:  None visualized.  Other Findings: Patient reportedly has fistula in the region of the left antecubital fossa.  IMPRESSION: Nonocclusive thrombus is noted in the left cephalic vein in the antecubital fossa. No other thrombus is noted in the left upper extremity.   Electronically Signed   By: Marijo Conception, M.D.   On: 06/29/2014 13:00    Scheduled Meds: . amLODipine  5 mg Oral Daily  . aspirin  81 mg Oral Daily  . carvedilol  25 mg Oral BID WC  . citalopram  40 mg Oral Daily  .  doxycycline  100 mg Oral Q12H  . gabapentin  800 mg Oral TID  . insulin aspart  0-20 Units Subcutaneous TID WC  . insulin aspart  4 Units Subcutaneous TID WC  . insulin glargine  45 Units Subcutaneous BID  . ipratropium-albuterol  3 mL Nebulization Q6H  . levothyroxine  50 mcg Oral QPM  . mupirocin ointment  1 application Topical BID  . pantoprazole  40 mg Oral Daily  . pravastatin  40 mg Oral QHS  . rOPINIRole  1 mg Oral QHS  . sodium chloride  3 mL Intravenous Q12H  . torsemide  40 mg Oral BID   Continuous Infusions:   Active Problems:   Hypothyroidism   Hypertension   Chest pain   Chronic kidney disease, stage IV (severe)    DM type 2, uncontrolled, with neuropathy   Dyslipidemia associated with type 2 diabetes mellitus    Time spent: 30 minutes    Barton Dubois  [facility] Pager [phone]. If 7PM-7AM, please contact night-coverage at [email or web address], password [facility] 06/30/2014, 1:04 PM  LOS: 1 day

## 2014-06-30 NOTE — Progress Notes (Signed)
Patient's CBG 409 MD notified.

## 2014-06-30 NOTE — Progress Notes (Signed)
  Echocardiogram 2D Echocardiogram has been performed.  [name] 06/30/2014, 12:15 PM

## 2014-06-30 NOTE — Care Management Note (Addendum)
    Page 1 of 1   07/04/2014     1:35:05 PM CARE MANAGEMENT NOTE 07/04/2014  Patient:  [name],[name]   Account Number:  [id number]  Date Initiated:  06/30/2014  Documentation initiated by:  CHILDRESS,JESSICA  Subjective/Objective Assessment:   Pt is from home, lives alone but has support from neighbors. Pt admitted for chest pain. Pt has cane for PRN use. Pt has no other DME's or [place] services. Pt plans to discharge home with self care. No CM needs.     Action/Plan:   Anticipated DC Date:  07/01/2014   Anticipated DC Plan:  [place]  CM consult      Choice offered to / List presented to:             Status of service:  Completed, signed off Medicare Important Message given?  YES (If response is "NO", the following Medicare IM given date fields will be blank) Date Medicare IM given:  06/30/2014 Medicare IM given by:  Jolene Provost Date Additional Medicare IM given:  07/04/2014 Additional Medicare IM given by:  JESSICA CHILDRESS  Discharge Disposition:  HOME/SELF CARE  Per UR Regulation:  Reviewed for med. necessity/level of care/duration of stay  If discussed at [place] of Stay Meetings, dates discussed:   07/04/2014    Comments:  07/04/2014 [place], RN, MSN, CM Pt discharging home today with self care. Pt does not qualify for home O2. No CM needs. 06/30/2014 0900 Jolene Provost, RN, MSN, CM

## 2014-06-30 NOTE — Care Management Utilization Note (Signed)
UR completed 

## 2014-07-01 DIAGNOSIS — R778 Other specified abnormalities of plasma proteins: Secondary | ICD-10-CM | POA: Diagnosis present

## 2014-07-01 DIAGNOSIS — I214 Non-ST elevation (NSTEMI) myocardial infarction: Secondary | ICD-10-CM | POA: Diagnosis present

## 2014-07-01 DIAGNOSIS — J209 Acute bronchitis, unspecified: Secondary | ICD-10-CM | POA: Diagnosis present

## 2014-07-01 DIAGNOSIS — R7989 Other specified abnormal findings of blood chemistry: Secondary | ICD-10-CM

## 2014-07-01 LAB — BASIC METABOLIC PANEL
Anion gap: 10 (ref 5–15)
BUN: 16 mg/dL (ref 6–23)
CALCIUM: 8.1 mg/dL — AB (ref 8.4–10.5)
CHLORIDE: 96 mmol/L (ref 96–112)
CO2: 31 mmol/L (ref 19–32)
CREATININE: 1.99 mg/dL — AB (ref 0.50–1.10)
GFR, EST AFRICAN AMERICAN: 29 mL/min — AB (ref >90)
GFR, EST NON AFRICAN AMERICAN: 25 mL/min — AB (ref >90)
Glucose, Bld: 233 mg/dL — ABNORMAL HIGH (ref 70–99)
POTASSIUM: 3.3 mmol/L — AB (ref 3.5–5.1)
Sodium: 137 mmol/L (ref 135–145)

## 2014-07-01 LAB — GLUCOSE, CAPILLARY
GLUCOSE-CAPILLARY: 285 mg/dL — AB (ref 70–99)
Glucose-Capillary: 225 mg/dL — ABNORMAL HIGH (ref 70–99)
Glucose-Capillary: 239 mg/dL — ABNORMAL HIGH (ref 70–99)
Glucose-Capillary: 343 mg/dL — ABNORMAL HIGH (ref 70–99)

## 2014-07-01 LAB — TROPONIN I: Troponin I: 0.3 ng/mL — ABNORMAL HIGH (ref <0.031)

## 2014-07-01 LAB — CBC
HCT: 36.9 % (ref 36.0–46.0)
HEMOGLOBIN: 11.7 g/dL — AB (ref 12.0–15.0)
MCH: 29.1 pg (ref 26.0–34.0)
MCHC: 31.7 g/dL (ref 30.0–36.0)
MCV: 91.8 fL (ref 78.0–100.0)
Platelets: 246 10*3/uL (ref 150–400)
RBC: 4.02 MIL/uL (ref 3.87–5.11)
RDW: 14.5 % (ref 11.5–15.5)
WBC: 7.4 10*3/uL (ref 4.0–10.5)

## 2014-07-01 LAB — HEMOGLOBIN A1C
HEMOGLOBIN A1C: 10.6 % — AB (ref 4.8–5.6)
Mean Plasma Glucose: 258 mg/dL

## 2014-07-01 LAB — HEPARIN LEVEL (UNFRACTIONATED): Heparin Unfractionated: 0.14 IU/mL — ABNORMAL LOW (ref 0.30–0.70)

## 2014-07-01 LAB — PROTIME-INR
INR: 1.01 (ref 0.00–1.49)
Prothrombin Time: 13.4 seconds (ref 11.6–15.2)

## 2014-07-01 MED ORDER — INSULIN GLARGINE 100 UNIT/ML ~~LOC~~ SOLN
48.0000 [IU] | Freq: Two times a day (BID) | SUBCUTANEOUS | Status: DC
  Administered 2014-07-01 – 2014-07-02 (×2): 48 [IU] via SUBCUTANEOUS
  Filled 2014-07-01 (×5): qty 0.48

## 2014-07-01 MED ORDER — INSULIN ASPART 100 UNIT/ML ~~LOC~~ SOLN
0.0000 [IU] | Freq: Three times a day (TID) | SUBCUTANEOUS | Status: DC
  Administered 2014-07-01: 15 [IU] via SUBCUTANEOUS
  Administered 2014-07-01: 7 [IU] via SUBCUTANEOUS
  Administered 2014-07-02: 20 [IU] via SUBCUTANEOUS
  Administered 2014-07-02 (×2): 11 [IU] via SUBCUTANEOUS
  Administered 2014-07-03: 15 [IU] via SUBCUTANEOUS
  Administered 2014-07-03: 20 [IU] via SUBCUTANEOUS
  Administered 2014-07-04: 7 [IU] via SUBCUTANEOUS
  Administered 2014-07-04: 15 [IU] via SUBCUTANEOUS

## 2014-07-01 MED ORDER — HEPARIN BOLUS VIA INFUSION
4000.0000 [IU] | Freq: Once | INTRAVENOUS | Status: AC
  Administered 2014-07-01: 4000 [IU] via INTRAVENOUS
  Filled 2014-07-01: qty 4000

## 2014-07-01 MED ORDER — HEPARIN (PORCINE) IN NACL 100-0.45 UNIT/ML-% IJ SOLN
1700.0000 [IU]/h | INTRAMUSCULAR | Status: DC
  Administered 2014-07-01: 1200 [IU]/h via INTRAVENOUS
  Administered 2014-07-02: 1700 [IU]/h via INTRAVENOUS
  Administered 2014-07-02: 1400 [IU]/h via INTRAVENOUS
  Filled 2014-07-01 (×3): qty 250

## 2014-07-01 MED ORDER — POTASSIUM CHLORIDE CRYS ER 20 MEQ PO TBCR
20.0000 meq | EXTENDED_RELEASE_TABLET | Freq: Two times a day (BID) | ORAL | Status: AC
  Administered 2014-07-01 (×2): 20 meq via ORAL
  Filled 2014-07-01 (×2): qty 1

## 2014-07-01 MED ORDER — INSULIN ASPART 100 UNIT/ML ~~LOC~~ SOLN
0.0000 [IU] | Freq: Every day | SUBCUTANEOUS | Status: DC
  Administered 2014-07-01: 3 [IU] via SUBCUTANEOUS
  Administered 2014-07-03: 4 [IU] via SUBCUTANEOUS

## 2014-07-01 NOTE — Progress Notes (Signed)
ANTICOAGULATION CONSULT NOTE  Pharmacy Consult for Heparin Indication: Chest Pain / ACS  Allergies  Allergen Reactions  . Latex Shortness Of Breath and Swelling    Facial swelling  . Vancomycin Shortness Of Breath  . Vancomycin Hcl Swelling    REACTION: facial swelling  . Adhesive [Tape] Rash    Plastic tape pulls her skin off when it is removed. Paper tape is ok per patient.  . Quinapril Hcl Swelling and Rash    Arm swelling   Patient Measurements: Height: 5\' 7"  (170.2 cm) Weight: 252 lb (114.306 kg) IBW/kg (Calculated) : 61.6 HEPARIN DW (KG): 88.2  Vital Signs: Temp: 98.5 F (36.9 C) (04/09 1318) Temp Source: Oral (04/09 1318) BP: 123/58 mmHg (04/09 1318) Pulse Rate: 77 (04/09 1318)  Labs:  Recent Labs  06/29/14 2147  06/30/14 0641 06/30/14 0646 06/30/14 1223 07/01/14 0708 07/01/14 1127 07/01/14 1740  HGB 12.3  --   --  11.8*  --  11.7*  --   --   HCT 37.8  --   --  36.7  --  36.9  --   --   PLT 260  --   --  238  --  246  --   --   LABPROT  --   --   --   --   --   --  13.4  --   INR  --   --   --   --   --   --  1.01  --   HEPARINUNFRC  --   --   --   --   --   --   --  0.14*  CREATININE 2.20*  --   --  2.09*  --  1.99*  --   --   TROPONINI 0.09*  < > 0.16*  --  0.19* 0.30*  --   --   < > = values in this interval not displayed.  Estimated Creatinine Clearance: 36.8 mL/min (by C-G formula based on Cr of 1.99).   Medical History: Past Medical History  Diagnosis Date  . Chest pain 2003    ?h/o MI, syncope; 2003 stress nuclear-inferoseptal scar, EF-54%; 2007-MI ruled out, echocardiogram-normal LV size, wall thickness and function  . RLS (restless legs syndrome)     sleep study in 2010: No apnea; moderate periodic movements  . GERD (gastroesophageal reflux disease)     H/o upper GI bleed  . GI bleeding   . Obese   . Gout   . Hypothyroidism   . Low back pain   . Cellulitis     Neck  . Diabetes mellitus 1998    A1c-7.1 in 2010, 6.7 in 2013  .  Hyperlipidemia     Fasting lipids 2013:158, 152, 35, 93  . Hypertension 2002  . Anemia   . Hyperkalemia   . Irregular heart beat   . PONV (postoperative nausea and vomiting)   . Myocardial infarction   . CAD (coronary artery disease)     dr. Lattie Haw ([facility])  . Depression     Insomnia  . Headache(784.0)   . Vitiligo   . H/O hiatal hernia   . Arthritis   . Cancer     HX SKIN CANCER  . Anginal pain     last cp few months ago.  Marland Kitchen Dysrhythmia     "skips a beat"  . COPD (chronic obstructive pulmonary disease)   . Chronic kidney disease, stage III (moderate)     Creat-1.9 in 04/2008,  2.65 in 02/2010(noHD  . Renal insufficiency   . Neuropathy    Medications:  Scheduled:  . amLODipine  5 mg Oral Daily  . aspirin  81 mg Oral Daily  . carvedilol  25 mg Oral BID WC  . citalopram  40 mg Oral Daily  . doxycycline  100 mg Oral Q12H  . gabapentin  800 mg Oral TID  . insulin aspart  0-20 Units Subcutaneous TID WC  . insulin aspart  0-5 Units Subcutaneous QHS  . insulin aspart  4 Units Subcutaneous TID WC  . insulin glargine  48 Units Subcutaneous BID  . ipratropium-albuterol  3 mL Nebulization TID  . levothyroxine  50 mcg Oral QPM  . mupirocin ointment  1 application Topical BID  . pantoprazole  40 mg Oral Daily  . potassium chloride  20 mEq Oral BID  . pravastatin  40 mg Oral QHS  . rOPINIRole  1 mg Oral QHS  . sodium chloride  3 mL Intravenous Q12H  . torsemide  40 mg Oral BID   Assessment: Okay for Protocol, Hx CAD.  Plan to heparinize through the weekend and consult cardiology on Monday.  No bleeding noted.  Baseline PT/INR WNL.  Cardiac Enzymes trending up.  Patient received treatment dose of enoxaparin on 06/29/2014.  Heparin level below goal  Goal of Therapy:  Heparin level 0.3-0.7 units/ml Monitor platelets by anticoagulation protocol: Yes   Plan:  Increase heparin infusion to 1400 units/hr Daily Hep Level will be in 6-8 hours. Check anti-Xa level daily while  on heparin Continue to monitor H&H and platelets  Pricilla Larsson 07/01/2014,8:53 PM

## 2014-07-01 NOTE — Progress Notes (Addendum)
[facility] PROGRESS NOTE  [name] [id number] DOB: Aug 11, 1950 DOA: 06/29/2014 PCP: Chevis Pretty, FNP    Code Status: Full code Family Communication: Discussed with patient's daughter, Jenny Reichmann Disposition Plan: Discharge when clinically appropriate    Consultants:  Cardiology, pending  Procedures:  2-D echocardiogram: Study Conclusions - Left ventricle: The cavity size was normal. Wall thickness was increased in a pattern of mild LVH. Systolic function was normal. The estimated ejection fraction was in the range of 60% to 65%. Wall motion was normal; there were no regional wall motion abnormalities. Left ventricular diastolic function parameters were normal. - Aortic valve: Valve area (VTI): 1.68 cm^2. Valve area (Vmax): 1.65 cm^2. - Left atrium: The atrium was mildly dilated. - Pulmonary arteries: Systolic pressure was moderately increased. PA peak pressure: 50 mm Hg (S). - Systemic veins: The IVC is dilated with normal respiratory response, estimated RA pressure is 8 mmHg. - Technically difficult study.  Antibiotics:  Doxycycline 4/8>>  HPI/Subjective: The patient denies chest pain or shortness of breath at rest. She has a mild nonproductive cough.  Objective: Filed Vitals:   07/01/14 0500  BP: 158/68  Pulse: 95  Temp: 98.9 F (37.2 C)  Resp: 20    Intake/Output Summary (Last 24 hours) at 07/01/14 1043 Last data filed at 07/01/14 [id number]  Gross per 24 hour  Intake    240 ml  Output    600 ml  Net   -360 ml   Filed Weights   06/29/14 1900  Weight: 114.306 kg (252 lb)    Exam:   General:  Obese 64 year old woman sitting up in bed, in no acute distress.  Cardiovascular: S1, S2, with a soft systolic murmur.  Respiratory: Rare expiratory wheezes; breathing nonlabored.  Abdomen: Obese, positive bowel sounds, soft, nontender, nondistended.  Musculoskeletal/extremities: Trace of pedal edema bilaterally; bilateral  upper extremity edema L>.R  With LUE fistula in place; multiple abrasions on both upper extremities.  Neurologic: Alert and oriented 3; cranial nerves II through XII are grossly intact.   Data Reviewed: Basic Metabolic Panel:  Recent Labs Lab 06/29/14 0746 06/29/14 2147 06/30/14 0646 07/01/14 0708  NA 133* 132* 138 137  K 3.3* 3.6 3.4* 3.3*  CL 91* 92* 95* 96  CO2 30 30 31 31   GLUCOSE 281* 434* 346* 233*  BUN 18 18 17 16   CREATININE 2.12* 2.20* 2.09* 1.99*  CALCIUM 8.0* 7.8* 8.0* 8.1*   Liver Function Tests:  Recent Labs Lab 06/29/14 0746 06/30/14 0646  AST 22 19  ALT 29 27  ALKPHOS 78 72  BILITOT 0.7 0.5  PROT 6.4 6.3  ALBUMIN 3.0* 3.0*   No results for input(s): LIPASE, AMYLASE in the last 168 hours. No results for input(s): AMMONIA in the last 168 hours. CBC:  Recent Labs Lab 06/29/14 0746 06/29/14 2147 06/30/14 0646 07/01/14 0708  WBC 7.9 6.3 6.0 7.4  NEUTROABS 4.6 3.4  --   --   HGB 12.2 12.3 11.8* 11.7*  HCT 37.7 37.8 36.7 36.9  MCV 91.1 92.0 91.8 91.8  PLT 247 260 238 246   Cardiac Enzymes:  Recent Labs Lab 06/29/14 2147 06/30/14 0056 06/30/14 0641 06/30/14 1223  TROPONINI 0.09* 0.11* 0.16* 0.19*   BNP (last 3 results) No results for input(s): BNP in the last 8760 hours.  ProBNP (last 3 results)  Recent Labs  10/22/13 1827  PROBNP 2668.0*    CBG:  Recent Labs Lab 06/30/14 0754 06/30/14 1142 06/30/14 1644 06/30/14 2046 07/01/14 0751  GLUCAP 366* 362*  409* 296* 225*    Recent Results (from the past 240 hour(s))  MRSA PCR Screening     Status: None   Collection Time: 06/30/14  1:10 AM  Result Value Ref Range Status   MRSA by PCR NEGATIVE NEGATIVE Final    Comment:        The GeneXpert MRSA Assay (FDA approved for NASAL specimens only), is one component of a comprehensive MRSA colonization surveillance program. It is not intended to diagnose MRSA infection nor to guide or monitor treatment for MRSA infections.       Studies: Nm Pulmonary Perf And Vent  06/30/2014   CLINICAL DATA:  Shortness of breath and chest pain. History of emphysema.  EXAM: NUCLEAR MEDICINE VENTILATION - PERFUSION LUNG SCAN  TECHNIQUE: Ventilation images were obtained in multiple projections using inhaled aerosol technetium 99 M DTPA. Perfusion images were obtained in multiple projections after intravenous injection of Tc-56m MAA.  RADIOPHARMACEUTICALS:  40 mCi Tc-3m DTPA aerosol and 6 mCi Tc-63m MAA  COMPARISON:  Chest radiographs 06/29/2014  FINDINGS: Ventilation: Mild heterogeneous distribution tracer activity throughout the lungs compatible with history of emphysema. Pooling of tracer in the central airways.  Perfusion: Normal homogeneous distribution of tracer activity throughout both lungs. No focal defects identified.  IMPRESSION: Low probability of pulmonary embolus.   Electronically Signed   By: Lucienne Capers M.D.   On: 06/30/2014 02:36   US Venous Img Upper Uni Left  06/29/2014   CLINICAL DATA:  Left upper extremity swelling for 2 weeks.  EXAM: Left UPPER EXTREMITY VENOUS DOPPLER ULTRASOUND  TECHNIQUE: Gray-scale sonography with graded compression, as well as color Doppler and duplex ultrasound were performed to evaluate the upper extremity deep venous system from the level of the subclavian vein and including the jugular, axillary, basilic, radial, ulnar and upper cephalic vein. Spectral Doppler was utilized to evaluate flow at rest and with distal augmentation maneuvers.  COMPARISON:  None.  FINDINGS: Contralateral Subclavian Vein: Respiratory phasicity is normal and symmetric with the symptomatic side. No evidence of thrombus. Normal compressibility.  Internal Jugular Vein: No evidence of thrombus. Normal compressibility, respiratory phasicity and response to augmentation.  Subclavian Vein: No evidence of thrombus. Normal compressibility, respiratory phasicity and response to augmentation.  Axillary Vein: No evidence of thrombus.  Normal compressibility, respiratory phasicity and response to augmentation.  Cephalic Vein: Partial compressibility of this vein in the region of the antecubital fossa is noted with flow consistent with nonocclusive thrombus.  Basilic Vein: No evidence of thrombus. Normal compressibility, respiratory phasicity and response to augmentation.  Brachial Veins: No evidence of thrombus. Normal compressibility, respiratory phasicity and response to augmentation.  Radial Veins: No evidence of thrombus. Normal compressibility, respiratory phasicity and response to augmentation.  Ulnar Veins: No evidence of thrombus. Normal compressibility, respiratory phasicity and response to augmentation.  Venous Reflux:  None visualized.  Other Findings: Patient reportedly has fistula in the region of the left antecubital fossa.  IMPRESSION: Nonocclusive thrombus is noted in the left cephalic vein in the antecubital fossa. No other thrombus is noted in the left upper extremity.   Electronically Signed   By: Marijo Conception, M.D.   On: 06/29/2014 13:00    Scheduled Meds: . amLODipine  5 mg Oral Daily  . aspirin  81 mg Oral Daily  . carvedilol  25 mg Oral BID WC  . citalopram  40 mg Oral Daily  . doxycycline  100 mg Oral Q12H  . gabapentin  800 mg Oral TID  .  insulin aspart  0-20 Units Subcutaneous TID WC  . insulin aspart  4 Units Subcutaneous TID WC  . insulin glargine  45 Units Subcutaneous BID  . ipratropium-albuterol  3 mL Nebulization TID  . levothyroxine  50 mcg Oral QPM  . mupirocin ointment  1 application Topical BID  . pantoprazole  40 mg Oral Daily  . pravastatin  40 mg Oral QHS  . rOPINIRole  1 mg Oral QHS  . sodium chloride  3 mL Intravenous Q12H  . torsemide  40 mg Oral BID   Continuous Infusions:   Assessment and plan:  Principal Problem:   Chest pain Active Problems:   Acute bronchitis   Non-ST elevation MI (NSTEMI)   Hypothyroidism   Hypertension   Chronic kidney disease, stage IV (severe)    DM type 2, uncontrolled, with neuropathy   Dyslipidemia associated with type 2 diabetes mellitus   1. Probable non-ST elevation myocardial infarction.  The patient presented with chest pain and a marginally elevated troponin I. She has a history of CAD. Her Cardiolite stress test in June 2014 which revealed no ischemia. Her EKG on admission revealed normal sinus rhythm with no ST or T-wave abnormalities. However, her troponin I has increased each day. Her 2-D echocardiogram revealed an EF of 60-65% and pulmonary hypertension, but no left ventricular regional wall motion abnormality. The patient was started on aspirin. She is already on carvedilol. Cardiology was consulted yesterday, but apparently the consult did not go through. -I discussed the patient with Nicki Reaper, cardiology PA on call who in turn discussed the patient with cardiologist, Dr. Aundra Dubin. Per his recommendation, in light of the patient's chronic kidney disease and nonacute EKG and the patient being chest pain free, he would start IV heparin and continue it through the weekend; continue to cycle troponin I; and consult cardiology on Monday for a Myoview stress test. If it is positive, he would recommend cardiac catheterization at [facility] with nephrology input -We'll start intravenous heparin and continue to cycle Troponin I 2. Will order a follow up EKG in the morning.  Hypertension. Currently stable on carvedilol and amlodipine.  Hyperlipidemia. We'll continue statin therapy.  Nonocclusive left upper extremity thrombus. The patient received Lovenox 1 in the ED. VQ scan was ordered and it was low probability for PE.  Acute bronchitis. The patient has no significant wheezing component, so we'll hold off on steroid treatment. We'll continue DuoNeb and doxycycline.  Stage IV chronic kidney disease. Her creatinine appears to be at baseline. She does have a left upper extremity AV fistula, but has not started dialysis yet. Continue  chronic torsemide.  Insulin-dependent diabetes mellitus with neuropathy. Her CBGs are not optimally controlled. Her hemoglobin A1c was 10.6, indicating poor outpatient control. We'll increase Lantus from 45 to 48 units twice a day; will add daily at bedtime NovoLog coverage and continue sliding scale NovoLog-resistant scale.  Hypothyroidism. Continue Synthroid.  Time spent: 35 minutes.    [facility] Pager [phone]. If 7PM-7AM, please contact night-coverage at [email or web address], password [facility] 07/01/2014, 10:43 AM  LOS: 2 days

## 2014-07-01 NOTE — Progress Notes (Signed)
ANTICOAGULATION CONSULT NOTE - Initial Consult  Pharmacy Consult for Heparin Indication: Chest Pain / ACS  Allergies  Allergen Reactions  . Latex Shortness Of Breath and Swelling    Facial swelling  . Vancomycin Shortness Of Breath  . Vancomycin Hcl Swelling    REACTION: facial swelling  . Adhesive [Tape] Rash    Plastic tape pulls her skin off when it is removed. Paper tape is ok per patient.  . Quinapril Hcl Swelling and Rash    Arm swelling   Patient Measurements: Height: 5\' 7"  (170.2 cm) Weight: 252 lb (114.306 kg) IBW/kg (Calculated) : 61.6 HEPARIN DW (KG): 88.2  Vital Signs: Temp: 98.9 F (37.2 C) (04/09 0500) Temp Source: Oral (04/09 0500) BP: 158/68 mmHg (04/09 0500) Pulse Rate: 95 (04/09 0500)  Labs:  Recent Labs  06/29/14 2147 06/30/14 0056 06/30/14 0641 06/30/14 0646 06/30/14 1223 07/01/14 0708  HGB 12.3  --   --  11.8*  --  11.7*  HCT 37.8  --   --  36.7  --  36.9  PLT 260  --   --  238  --  246  CREATININE 2.20*  --   --  2.09*  --  1.99*  TROPONINI 0.09* 0.11* 0.16*  --  0.19*  --     Estimated Creatinine Clearance: 36.8 mL/min (by C-G formula based on Cr of 1.99).   Medical History: Past Medical History  Diagnosis Date  . Chest pain 2003    ?h/o MI, syncope; 2003 stress nuclear-inferoseptal scar, EF-54%; 2007-MI ruled out, echocardiogram-normal LV size, wall thickness and function  . RLS (restless legs syndrome)     sleep study in 2010: No apnea; moderate periodic movements  . GERD (gastroesophageal reflux disease)     H/o upper GI bleed  . GI bleeding   . Obese   . Gout   . Hypothyroidism   . Low back pain   . Cellulitis     Neck  . Diabetes mellitus 1998    A1c-7.1 in 2010, 6.7 in 2013  . Hyperlipidemia     Fasting lipids 2013:158, 152, 35, 93  . Hypertension 2002  . Anemia   . Hyperkalemia   . Irregular heart beat   . PONV (postoperative nausea and vomiting)   . Myocardial infarction   . CAD (coronary artery disease)    dr. Lattie Haw (Linna Hoff)  . Depression     Insomnia  . Headache(784.0)   . Vitiligo   . H/O hiatal hernia   . Arthritis   . Cancer     HX SKIN CANCER  . Anginal pain     last cp few months ago.  Marland Kitchen Dysrhythmia     "skips a beat"  . COPD (chronic obstructive pulmonary disease)   . Chronic kidney disease, stage III (moderate)     Creat-1.9 in 04/2008, 2.65 in 02/2010(noHD  . Renal insufficiency   . Neuropathy    Medications:  Scheduled:  . amLODipine  5 mg Oral Daily  . aspirin  81 mg Oral Daily  . carvedilol  25 mg Oral BID WC  . citalopram  40 mg Oral Daily  . doxycycline  100 mg Oral Q12H  . gabapentin  800 mg Oral TID  . insulin aspart  0-20 Units Subcutaneous TID WC  . insulin aspart  0-5 Units Subcutaneous QHS  . insulin aspart  4 Units Subcutaneous TID WC  . insulin glargine  48 Units Subcutaneous BID  . ipratropium-albuterol  3 mL Nebulization TID  .  levothyroxine  50 mcg Oral QPM  . mupirocin ointment  1 application Topical BID  . pantoprazole  40 mg Oral Daily  . potassium chloride  20 mEq Oral BID  . pravastatin  40 mg Oral QHS  . rOPINIRole  1 mg Oral QHS  . sodium chloride  3 mL Intravenous Q12H  . torsemide  40 mg Oral BID   Assessment: Okay for Protocol, Hx CAD.  Plan to heparinize through the weekend and consult cardiology on Monday.  No bleeding noted.  Baseline PT/INR will be ordered.  Cardiac Enzymes ordered.  Patient received treatment dose of enoxaparin on 06/29/2014.  Goal of Therapy:  Heparin level 0.3-0.7 units/ml Monitor platelets by anticoagulation protocol: Yes   Plan:  Give 4000 units bolus x 1 Start heparin infusion at 1200 units/hr Check anti-Xa level in 6-8 hours and daily while on heparin Continue to monitor H&H and platelets  Pricilla Larsson 07/01/2014,10:57 AM

## 2014-07-02 DIAGNOSIS — R7989 Other specified abnormal findings of blood chemistry: Secondary | ICD-10-CM

## 2014-07-02 LAB — CBC
HCT: 37.2 % (ref 36.0–46.0)
Hemoglobin: 12.1 g/dL (ref 12.0–15.0)
MCH: 29.7 pg (ref 26.0–34.0)
MCHC: 32.5 g/dL (ref 30.0–36.0)
MCV: 91.4 fL (ref 78.0–100.0)
Platelets: 255 10*3/uL (ref 150–400)
RBC: 4.07 MIL/uL (ref 3.87–5.11)
RDW: 14.7 % (ref 11.5–15.5)
WBC: 7.9 10*3/uL (ref 4.0–10.5)

## 2014-07-02 LAB — TROPONIN I: Troponin I: 0.3 ng/mL — ABNORMAL HIGH (ref <0.031)

## 2014-07-02 LAB — BASIC METABOLIC PANEL
ANION GAP: 10 (ref 5–15)
BUN: 18 mg/dL (ref 6–23)
CALCIUM: 8.4 mg/dL (ref 8.4–10.5)
CO2: 30 mmol/L (ref 19–32)
CREATININE: 2.06 mg/dL — AB (ref 0.50–1.10)
Chloride: 95 mmol/L — ABNORMAL LOW (ref 96–112)
GFR calc Af Amer: 28 mL/min — ABNORMAL LOW (ref >90)
GFR calc non Af Amer: 24 mL/min — ABNORMAL LOW (ref >90)
GLUCOSE: 268 mg/dL — AB (ref 70–99)
Potassium: 3.8 mmol/L (ref 3.5–5.1)
Sodium: 135 mmol/L (ref 135–145)

## 2014-07-02 LAB — GLUCOSE, CAPILLARY
GLUCOSE-CAPILLARY: 259 mg/dL — AB (ref 70–99)
GLUCOSE-CAPILLARY: 468 mg/dL — AB (ref 70–99)
Glucose-Capillary: 253 mg/dL — ABNORMAL HIGH (ref 70–99)
Glucose-Capillary: 389 mg/dL — ABNORMAL HIGH (ref 70–99)

## 2014-07-02 LAB — HEPARIN LEVEL (UNFRACTIONATED)
HEPARIN UNFRACTIONATED: 0.2 [IU]/mL — AB (ref 0.30–0.70)
HEPARIN UNFRACTIONATED: 0.26 [IU]/mL — AB (ref 0.30–0.70)

## 2014-07-02 MED ORDER — POLYETHYLENE GLYCOL 3350 17 G PO PACK
17.0000 g | PACK | Freq: Every day | ORAL | Status: DC
  Administered 2014-07-02 – 2014-07-04 (×3): 17 g via ORAL
  Filled 2014-07-02 (×3): qty 1

## 2014-07-02 MED ORDER — IPRATROPIUM-ALBUTEROL 0.5-2.5 (3) MG/3ML IN SOLN
3.0000 mL | RESPIRATORY_TRACT | Status: DC
  Administered 2014-07-02 (×3): 3 mL via RESPIRATORY_TRACT
  Filled 2014-07-02 (×3): qty 3

## 2014-07-02 MED ORDER — ALBUTEROL SULFATE (2.5 MG/3ML) 0.083% IN NEBU
2.5000 mg | INHALATION_SOLUTION | RESPIRATORY_TRACT | Status: DC | PRN
  Filled 2014-07-02: qty 3

## 2014-07-02 MED ORDER — INSULIN GLARGINE 100 UNIT/ML ~~LOC~~ SOLN
50.0000 [IU] | Freq: Two times a day (BID) | SUBCUTANEOUS | Status: DC
  Administered 2014-07-02 – 2014-07-04 (×4): 50 [IU] via SUBCUTANEOUS
  Filled 2014-07-02 (×6): qty 0.5

## 2014-07-02 MED ORDER — INSULIN ASPART 100 UNIT/ML ~~LOC~~ SOLN
8.0000 [IU] | Freq: Once | SUBCUTANEOUS | Status: AC
  Administered 2014-07-02: 8 [IU] via SUBCUTANEOUS

## 2014-07-02 MED ORDER — IPRATROPIUM-ALBUTEROL 0.5-2.5 (3) MG/3ML IN SOLN
3.0000 mL | Freq: Four times a day (QID) | RESPIRATORY_TRACT | Status: DC
  Administered 2014-07-03 – 2014-07-04 (×5): 3 mL via RESPIRATORY_TRACT
  Filled 2014-07-02 (×5): qty 3

## 2014-07-02 MED ORDER — METHYLPREDNISOLONE SODIUM SUCC 125 MG IJ SOLR
60.0000 mg | Freq: Two times a day (BID) | INTRAMUSCULAR | Status: DC
  Administered 2014-07-02 (×2): 60 mg via INTRAVENOUS
  Filled 2014-07-02 (×3): qty 2

## 2014-07-02 MED ORDER — GUAIFENESIN ER 600 MG PO TB12
1200.0000 mg | ORAL_TABLET | Freq: Two times a day (BID) | ORAL | Status: DC
  Administered 2014-07-02 – 2014-07-04 (×5): 1200 mg via ORAL
  Filled 2014-07-02 (×5): qty 2

## 2014-07-02 NOTE — Progress Notes (Signed)
ANTICOAGULATION CONSULT NOTE  Pharmacy Consult for Heparin Indication: Chest Pain / ACS  Allergies  Allergen Reactions  . Latex Shortness Of Breath and Swelling    Facial swelling  . Vancomycin Shortness Of Breath  . Vancomycin Hcl Swelling    REACTION: facial swelling  . Adhesive [Tape] Rash    Plastic tape pulls her skin off when it is removed. Paper tape is ok per patient.  . Quinapril Hcl Swelling and Rash    Arm swelling   Patient Measurements: Height: 5\' 7"  (170.2 cm) Weight: 252 lb (114.306 kg) IBW/kg (Calculated) : 61.6 HEPARIN DW (KG): 88.2  Vital Signs: Temp: 98.6 F (37 C) (04/10 0655) Temp Source: Axillary (04/10 0655) BP: 143/66 mmHg (04/10 0655) Pulse Rate: 79 (04/10 0655)  Labs:  Recent Labs  06/29/14 2147  06/30/14 0641 06/30/14 0646 06/30/14 1223 07/01/14 0708 07/01/14 1127 07/01/14 1740 07/02/14 0846  HGB 12.3  --   --  11.8*  --  11.7*  --   --  12.1  HCT 37.8  --   --  36.7  --  36.9  --   --  37.2  PLT 260  --   --  238  --  246  --   --  255  LABPROT  --   --   --   --   --   --  13.4  --   --   INR  --   --   --   --   --   --  1.01  --   --   HEPARINUNFRC  --   --   --   --   --   --   --  0.14* 0.20*  CREATININE 2.20*  --   --  2.09*  --  1.99*  --   --   --   TROPONINI 0.09*  < > 0.16*  --  0.19* 0.30*  --   --   --   < > = values in this interval not displayed.  Estimated Creatinine Clearance: 36.8 mL/min (by C-G formula based on Cr of 1.99).   Medical History: Past Medical History  Diagnosis Date  . Chest pain 2003    ?h/o MI, syncope; 2003 stress nuclear-inferoseptal scar, EF-54%; 2007-MI ruled out, echocardiogram-normal LV size, wall thickness and function  . RLS (restless legs syndrome)     sleep study in 2010: No apnea; moderate periodic movements  . GERD (gastroesophageal reflux disease)     H/o upper GI bleed  . GI bleeding   . Obese   . Gout   . Hypothyroidism   . Low back pain   . Cellulitis     Neck  .  Diabetes mellitus 1998    A1c-7.1 in 2010, 6.7 in 2013  . Hyperlipidemia     Fasting lipids 2013:158, 152, 35, 93  . Hypertension 2002  . Anemia   . Hyperkalemia   . Irregular heart beat   . PONV (postoperative nausea and vomiting)   . Myocardial infarction   . CAD (coronary artery disease)     dr. Lattie Haw (Linna Hoff)  . Depression     Insomnia  . Headache(784.0)   . Vitiligo   . H/O hiatal hernia   . Arthritis   . Cancer     HX SKIN CANCER  . Anginal pain     last cp few months ago.  Marland Kitchen Dysrhythmia     "skips a beat"  . COPD (chronic obstructive  pulmonary disease)   . Chronic kidney disease, stage III (moderate)     Creat-1.9 in 04/2008, 2.65 in 02/2010(noHD  . Renal insufficiency   . Neuropathy    Medications:  Scheduled:  . amLODipine  5 mg Oral Daily  . aspirin  81 mg Oral Daily  . carvedilol  25 mg Oral BID WC  . citalopram  40 mg Oral Daily  . doxycycline  100 mg Oral Q12H  . gabapentin  800 mg Oral TID  . insulin aspart  0-20 Units Subcutaneous TID WC  . insulin aspart  0-5 Units Subcutaneous QHS  . insulin aspart  4 Units Subcutaneous TID WC  . insulin glargine  48 Units Subcutaneous BID  . ipratropium-albuterol  3 mL Nebulization TID  . levothyroxine  50 mcg Oral QPM  . mupirocin ointment  1 application Topical BID  . pantoprazole  40 mg Oral Daily  . pravastatin  40 mg Oral QHS  . rOPINIRole  1 mg Oral QHS  . sodium chloride  3 mL Intravenous Q12H  . torsemide  40 mg Oral BID   Assessment: Okay for Protocol, Hx CAD.  Plan to heparinize through the weekend and consult cardiology on Monday.  No bleeding noted.  CBC stable.  Baseline PT/INR WNL.  Cardiac Enzymes trending up.  Patient received treatment dose of enoxaparin on 06/29/2014.  Heparin level remains below goal  Goal of Therapy:  Heparin level 0.3-0.7 units/ml Monitor platelets by anticoagulation protocol: Yes   Plan:  Increase heparin infusion to 1600 units/hr Repeat anti-XA Level in 6-8  hours. Check anti-Xa level daily while on heparin Continue to monitor H&H and platelets  Pricilla Larsson 07/02/2014,9:03 AM

## 2014-07-02 NOTE — Progress Notes (Signed)
Notified Hospitalist, that pt is getting continuous heparin in right hand and has a fistula in left arm. Labs need to be obtained, but cannot be drawn from either arm. Awaiting MD's response.

## 2014-07-02 NOTE — Progress Notes (Signed)
[facility] PROGRESS NOTE  [name] [id number] DOB: 1950-08-05 DOA: 06/29/2014 PCP: Chevis Pretty, FNP    Code Status: Full code Family Communication: No family present Disposition Plan: Discharge when clinically appropriate    Consultants:  Cardiology, pending  Procedures:  2-D echocardiogram: Study Conclusions - Left ventricle: The cavity size was normal. Wall thickness was increased in a pattern of mild LVH. Systolic function was normal. The estimated ejection fraction was in the range of 60% to 65%. Wall motion was normal; there were no regional wall motion abnormalities. Left ventricular diastolic function parameters were normal. - Aortic valve: Valve area (VTI): 1.68 cm^2. Valve area (Vmax): 1.65 cm^2. - Left atrium: The atrium was mildly dilated. - Pulmonary arteries: Systolic pressure was moderately increased. PA peak pressure: 50 mm Hg (S). - Systemic veins: The IVC is dilated with normal respiratory response, estimated RA pressure is 8 mmHg. - Technically difficult study.  Antibiotics:  Doxycycline 4/8>>  HPI/Subjective: Had some left sided chest pain yesterday, but no recurrence since then. She does have some discomfort across her chest when she coughs, but feels this is different than her left sided chest pain. Continues to cough with some sputum production. Still short of breath.  Objective: Filed Vitals:   07/02/14 0655  BP: 143/66  Pulse: 79  Temp: 98.6 F (37 C)  Resp: 20    Intake/Output Summary (Last 24 hours) at 07/02/14 0934 Last data filed at 07/02/14 0656  Gross per 24 hour  Intake    240 ml  Output   1500 ml  Net  -1260 ml   Filed Weights   06/29/14 1900  Weight: 114.306 kg (252 lb)    Exam:   General:  Obese 64 year old woman sitting up in bed, in no acute distress.  Cardiovascular: S1, S2, with a soft systolic murmur.  Respiratory: Rare expiratory wheezes; diminished breath sounds,   breathing nonlabored.  Abdomen: Obese, positive bowel sounds, soft, nontender, nondistended.  Musculoskeletal/extremities: Trace of pedal edema bilaterally; bilateral upper extremity edema L>.R  With LUE fistula in place; multiple abrasions on both upper extremities.  Neurologic: Alert and oriented 3; cranial nerves II through XII are grossly intact.   Data Reviewed: Basic Metabolic Panel:  Recent Labs Lab 06/29/14 0746 06/29/14 2147 06/30/14 0646 07/01/14 0708 07/02/14 0846  NA 133* 132* 138 137 135  K 3.3* 3.6 3.4* 3.3* 3.8  CL 91* 92* 95* 96 95*  CO2 30 30 31 31 30   GLUCOSE 281* 434* 346* 233* 268*  BUN 18 18 17 16 18   CREATININE 2.12* 2.20* 2.09* 1.99* 2.06*  CALCIUM 8.0* 7.8* 8.0* 8.1* 8.4   Liver Function Tests:  Recent Labs Lab 06/29/14 0746 06/30/14 0646  AST 22 19  ALT 29 27  ALKPHOS 78 72  BILITOT 0.7 0.5  PROT 6.4 6.3  ALBUMIN 3.0* 3.0*   No results for input(s): LIPASE, AMYLASE in the last 168 hours. No results for input(s): AMMONIA in the last 168 hours. CBC:  Recent Labs Lab 06/29/14 0746 06/29/14 2147 06/30/14 0646 07/01/14 0708 07/02/14 0846  WBC 7.9 6.3 6.0 7.4 7.9  NEUTROABS 4.6 3.4  --   --   --   HGB 12.2 12.3 11.8* 11.7* 12.1  HCT 37.7 37.8 36.7 36.9 37.2  MCV 91.1 92.0 91.8 91.8 91.4  PLT 247 260 238 246 255   Cardiac Enzymes:  Recent Labs Lab 06/30/14 0056 06/30/14 0641 06/30/14 1223 07/01/14 0708 07/02/14 0846  TROPONINI 0.11* 0.16* 0.19* 0.30* 0.30*  BNP (last 3 results) No results for input(s): BNP in the last 8760 hours.  ProBNP (last 3 results)  Recent Labs  10/22/13 1827  PROBNP 2668.0*    CBG:  Recent Labs Lab 07/01/14 0751 07/01/14 1133 07/01/14 1638 07/01/14 2059 07/02/14 0726  GLUCAP 225* 239* 343* 285* 259*    Recent Results (from the past 240 hour(s))  MRSA PCR Screening     Status: None   Collection Time: 06/30/14  1:10 AM  Result Value Ref Range Status   MRSA by PCR NEGATIVE NEGATIVE  Final    Comment:        The GeneXpert MRSA Assay (FDA approved for NASAL specimens only), is one component of a comprehensive MRSA colonization surveillance program. It is not intended to diagnose MRSA infection nor to guide or monitor treatment for MRSA infections.      Studies: No results found.  Scheduled Meds: . amLODipine  5 mg Oral Daily  . aspirin  81 mg Oral Daily  . carvedilol  25 mg Oral BID WC  . citalopram  40 mg Oral Daily  . doxycycline  100 mg Oral Q12H  . gabapentin  800 mg Oral TID  . guaiFENesin  1,200 mg Oral BID  . insulin aspart  0-20 Units Subcutaneous TID WC  . insulin aspart  0-5 Units Subcutaneous QHS  . insulin aspart  4 Units Subcutaneous TID WC  . insulin glargine  48 Units Subcutaneous BID  . ipratropium-albuterol  3 mL Nebulization Q4H  . levothyroxine  50 mcg Oral QPM  . methylPREDNISolone (SOLU-MEDROL) injection  60 mg Intravenous Q12H  . mupirocin ointment  1 application Topical BID  . pantoprazole  40 mg Oral Daily  . polyethylene glycol  17 g Oral Daily  . pravastatin  40 mg Oral QHS  . rOPINIRole  1 mg Oral QHS  . sodium chloride  3 mL Intravenous Q12H  . torsemide  40 mg Oral BID   Continuous Infusions: . heparin 1,400 Units/hr (07/02/14 0434)    Assessment and plan:  Principal Problem:   Chest pain Active Problems:   Hypothyroidism   Hypertension   Chronic kidney disease, stage IV (severe)   DM type 2, uncontrolled, with neuropathy   Dyslipidemia associated with type 2 diabetes mellitus   Acute bronchitis   Non-ST elevation MI (NSTEMI)   1. Probable non-ST elevation myocardial infarction.  The patient presented with chest pain and a marginally elevated troponin I. She has a history of CAD. Her Cardiolite stress test in June 2014 which revealed no ischemia. Her EKG on admission revealed normal sinus rhythm with no ST or T-wave abnormalities. However, her troponin I has increased each day. Her 2-D echocardiogram revealed  an EF of 60-65% and pulmonary hypertension, but no left ventricular regional wall motion abnormality. The patient was started on aspirin. She is already on carvedilol. Cardiology was consulted 4/8, but apparently the consult did not go through. -Dr. Caryn Section discussed the patient with Nicki Reaper, cardiology PA on call who in turn discussed the patient with cardiologist, Dr. Aundra Dubin. Per his recommendation, in light of the patient's chronic kidney disease and nonacute EKG and the patient being chest pain free, he would start IV heparin and continue it through the weekend; continue to cycle troponin I; and consult cardiology on Monday for a Myoview stress test. If it is positive, he would recommend cardiac catheterization at [facility] with nephrology input -She is currently on intravenous heparin and continue to cycle Troponin I 2. Await  further input from cardiology.  Hypertension. Currently stable on carvedilol and amlodipine.  Hyperlipidemia. We'll continue statin therapy.  Nonocclusive left upper extremity superficial thrombus. The patient received Lovenox 1 in the ED. VQ scan was ordered and it was low probability for PE.  Acute bronchitis. She continues to feel short of breath and has persistent cough. We'll continue DuoNeb and doxycycline. Will add intravenous steroids until symptoms improve.  Stage IV chronic kidney disease. Her creatinine appears to be at baseline. She does have a left upper extremity AV fistula, but has not started dialysis yet. Continue chronic torsemide.  Insulin-dependent diabetes mellitus with neuropathy. Her CBGs are not optimally controlled. Her hemoglobin A1c was 10.6, indicating poor outpatient control. Lantus and sliding scale or being adjusted for further optimal control.  Hypothyroidism. Continue Synthroid.  Time spent: 30 minutes.    [name]  [facility] Pager [phone] If 7PM-7AM, please contact night-coverage at [email or web address], password  [facility] 07/02/2014, 9:34 AM  LOS: 3 days

## 2014-07-02 NOTE — Progress Notes (Signed)
Patients CBG is 468, paged on-call MD, will follow orders given and continue to monitor patient.

## 2014-07-02 NOTE — Progress Notes (Signed)
MD (Dr. Darrick Meigs) called and stated he believes the CBC and BMP can be discontinued for labs, since pt is preparing for dialysis. MD stated to consult MD ordering dialysis. Will contact Dr. Birdie Sons.

## 2014-07-02 NOTE — Progress Notes (Signed)
ANTICOAGULATION CONSULT NOTE  Pharmacy Consult for Heparin Indication: Chest Pain / ACS  Allergies  Allergen Reactions  . Latex Shortness Of Breath and Swelling    Facial swelling  . Vancomycin Shortness Of Breath  . Vancomycin Hcl Swelling    REACTION: facial swelling  . Adhesive [Tape] Rash    Plastic tape pulls her skin off when it is removed. Paper tape is ok per patient.  . Quinapril Hcl Swelling and Rash    Arm swelling   Patient Measurements: Height: 5\' 7"  (170.2 cm) Weight: 252 lb (114.306 kg) IBW/kg (Calculated) : 61.6 HEPARIN DW (KG): 88.2  Vital Signs: Temp: 98.5 F (36.9 C) (04/10 1240) Temp Source: Oral (04/10 1240) BP: 141/62 mmHg (04/10 1240) Pulse Rate: 73 (04/10 1240)  Labs:  Recent Labs  06/30/14 0646 06/30/14 1223 07/01/14 0708 07/01/14 1127 07/01/14 1740 07/02/14 0846 07/02/14 1605  HGB 11.8*  --  11.7*  --   --  12.1  --   HCT 36.7  --  36.9  --   --  37.2  --   PLT 238  --  246  --   --  255  --   LABPROT  --   --   --  13.4  --   --   --   INR  --   --   --  1.01  --   --   --   HEPARINUNFRC  --   --   --   --  0.14* 0.20* 0.26*  CREATININE 2.09*  --  1.99*  --   --  2.06*  --   TROPONINI  --  0.19* 0.30*  --   --  0.30*  --     Estimated Creatinine Clearance: 35.5 mL/min (by C-G formula based on Cr of 2.06).   Medical History: Past Medical History  Diagnosis Date  . Chest pain 2003    ?h/o MI, syncope; 2003 stress nuclear-inferoseptal scar, EF-54%; 2007-MI ruled out, echocardiogram-normal LV size, wall thickness and function  . RLS (restless legs syndrome)     sleep study in 2010: No apnea; moderate periodic movements  . GERD (gastroesophageal reflux disease)     H/o upper GI bleed  . GI bleeding   . Obese   . Gout   . Hypothyroidism   . Low back pain   . Cellulitis     Neck  . Diabetes mellitus 1998    A1c-7.1 in 2010, 6.7 in 2013  . Hyperlipidemia     Fasting lipids 2013:158, 152, 35, 93  . Hypertension 2002  .  Anemia   . Hyperkalemia   . Irregular heart beat   . PONV (postoperative nausea and vomiting)   . Myocardial infarction   . CAD (coronary artery disease)     dr. Lattie Haw (Linna Hoff)  . Depression     Insomnia  . Headache(784.0)   . Vitiligo   . H/O hiatal hernia   . Arthritis   . Cancer     HX SKIN CANCER  . Anginal pain     last cp few months ago.  Marland Kitchen Dysrhythmia     "skips a beat"  . COPD (chronic obstructive pulmonary disease)   . Chronic kidney disease, stage III (moderate)     Creat-1.9 in 04/2008, 2.65 in 02/2010(noHD  . Renal insufficiency   . Neuropathy    Medications:  Scheduled:  . amLODipine  5 mg Oral Daily  . aspirin  81 mg Oral Daily  .  carvedilol  25 mg Oral BID WC  . citalopram  40 mg Oral Daily  . doxycycline  100 mg Oral Q12H  . gabapentin  800 mg Oral TID  . guaiFENesin  1,200 mg Oral BID  . insulin aspart  0-20 Units Subcutaneous TID WC  . insulin aspart  0-5 Units Subcutaneous QHS  . insulin aspart  4 Units Subcutaneous TID WC  . insulin glargine  50 Units Subcutaneous BID  . ipratropium-albuterol  3 mL Nebulization Q4H  . levothyroxine  50 mcg Oral QPM  . methylPREDNISolone (SOLU-MEDROL) injection  60 mg Intravenous Q12H  . mupirocin ointment  1 application Topical BID  . pantoprazole  40 mg Oral Daily  . polyethylene glycol  17 g Oral Daily  . pravastatin  40 mg Oral QHS  . rOPINIRole  1 mg Oral QHS  . sodium chloride  3 mL Intravenous Q12H  . torsemide  40 mg Oral BID   Assessment: Okay for Protocol, Hx CAD.  Plan to heparinize through the weekend and consult cardiology on Monday.  No bleeding noted.  CBC stable.  Baseline PT/INR WNL.  Troponin I stabilized.  Patient received treatment dose of enoxaparin on 06/29/2014.  Heparin level slightly below goal  Goal of Therapy:  Heparin level 0.3-0.7 units/ml Monitor platelets by anticoagulation protocol: Yes   Plan:   Increase heparin infusion to 1700 units/hr  Defer next anti-Xa level  until AM labs since nearly @ goal and patient required lower extemity stick for blood draws today.  Check anti-Xa level daily while on heparin  Continue to monitor H&H and platelets  Pricilla Larsson 07/02/2014,4:45 PM

## 2014-07-03 ENCOUNTER — Encounter (HOSPITAL_COMMUNITY): Payer: Self-pay | Admitting: Cardiology

## 2014-07-03 ENCOUNTER — Encounter (HOSPITAL_COMMUNITY): Payer: Self-pay

## 2014-07-03 ENCOUNTER — Inpatient Hospital Stay (HOSPITAL_COMMUNITY): Payer: Commercial Managed Care - HMO

## 2014-07-03 DIAGNOSIS — R072 Precordial pain: Secondary | ICD-10-CM

## 2014-07-03 DIAGNOSIS — I248 Other forms of acute ischemic heart disease: Secondary | ICD-10-CM

## 2014-07-03 LAB — CBC
HCT: 36.3 % (ref 36.0–46.0)
Hemoglobin: 11.9 g/dL — ABNORMAL LOW (ref 12.0–15.0)
MCH: 29.5 pg (ref 26.0–34.0)
MCHC: 32.8 g/dL (ref 30.0–36.0)
MCV: 89.9 fL (ref 78.0–100.0)
Platelets: 291 10*3/uL (ref 150–400)
RBC: 4.04 MIL/uL (ref 3.87–5.11)
RDW: 14.6 % (ref 11.5–15.5)
WBC: 14.1 10*3/uL — ABNORMAL HIGH (ref 4.0–10.5)

## 2014-07-03 LAB — BASIC METABOLIC PANEL
Anion gap: 11 (ref 5–15)
BUN: 23 mg/dL (ref 6–23)
CO2: 29 mmol/L (ref 19–32)
Calcium: 8.7 mg/dL (ref 8.4–10.5)
Chloride: 93 mmol/L — ABNORMAL LOW (ref 96–112)
Creatinine, Ser: 2.19 mg/dL — ABNORMAL HIGH (ref 0.50–1.10)
GFR calc Af Amer: 26 mL/min — ABNORMAL LOW (ref >90)
GFR calc non Af Amer: 22 mL/min — ABNORMAL LOW (ref >90)
Glucose, Bld: 470 mg/dL — ABNORMAL HIGH (ref 70–99)
Potassium: 4.1 mmol/L (ref 3.5–5.1)
Sodium: 133 mmol/L — ABNORMAL LOW (ref 135–145)

## 2014-07-03 LAB — GLUCOSE, CAPILLARY
GLUCOSE-CAPILLARY: 312 mg/dL — AB (ref 70–99)
Glucose-Capillary: 466 mg/dL — ABNORMAL HIGH (ref 70–99)
Glucose-Capillary: 499 mg/dL — ABNORMAL HIGH (ref 70–99)
Glucose-Capillary: 424 mg/dL — ABNORMAL HIGH (ref 70–99)
Glucose-Capillary: 332 mg/dL — ABNORMAL HIGH (ref 70–99)
Glucose-Capillary: 320 mg/dL — ABNORMAL HIGH (ref 70–99)

## 2014-07-03 LAB — HEPARIN LEVEL (UNFRACTIONATED): Heparin Unfractionated: 0.63 IU/mL (ref 0.30–0.70)

## 2014-07-03 MED ORDER — ENOXAPARIN SODIUM 150 MG/ML ~~LOC~~ SOLN: 170.0000 mg | SUBCUTANEOUS | Status: DC

## 2014-07-03 MED ORDER — ENOXAPARIN SODIUM 150 MG/ML ~~LOC~~ SOLN
170.0000 mg | SUBCUTANEOUS | Status: DC
  Filled 2014-07-03 (×3): qty 2

## 2014-07-03 MED ORDER — INSULIN ASPART 100 UNIT/ML ~~LOC~~ SOLN
25.0000 [IU] | Freq: Once | SUBCUTANEOUS | Status: AC
  Administered 2014-07-03: 25 [IU] via SUBCUTANEOUS

## 2014-07-03 MED ORDER — TECHNETIUM TC 99M SESTAMIBI GENERIC - CARDIOLITE
30.0000 | Freq: Once | INTRAVENOUS | Status: AC | PRN
  Administered 2014-07-03: 29.5 via INTRAVENOUS

## 2014-07-03 MED ORDER — INSULIN ASPART 100 UNIT/ML ~~LOC~~ SOLN
10.0000 [IU] | Freq: Three times a day (TID) | SUBCUTANEOUS | Status: DC
  Administered 2014-07-03 – 2014-07-04 (×4): 10 [IU] via SUBCUTANEOUS

## 2014-07-03 MED ORDER — REGADENOSON 0.4 MG/5ML IV SOLN
INTRAVENOUS | Status: AC
  Administered 2014-07-03: 0.4 mg via INTRAVENOUS
  Filled 2014-07-03: qty 5

## 2014-07-03 MED ORDER — REGADENOSON 0.4 MG/5ML IV SOLN
0.4000 mg | Freq: Once | INTRAVENOUS | Status: AC
  Administered 2014-07-03: 0.4 mg via INTRAVENOUS
  Filled 2014-07-03: qty 5

## 2014-07-03 MED ORDER — REGADENOSON 0.4 MG/5ML IV SOLN
0.4000 mg | Freq: Once | INTRAVENOUS | Status: AC | PRN
  Filled 2014-07-03: qty 5

## 2014-07-03 MED ORDER — SODIUM CHLORIDE 0.9 % IJ SOLN
INTRAMUSCULAR | Status: AC
  Administered 2014-07-03: 10 mL via INTRAVENOUS
  Filled 2014-07-03: qty 3

## 2014-07-03 MED ORDER — ENOXAPARIN SODIUM 60 MG/0.6ML ~~LOC~~ SOLN
60.0000 mg | SUBCUTANEOUS | Status: DC
  Administered 2014-07-03 – 2014-07-04 (×2): 60 mg via SUBCUTANEOUS
  Filled 2014-07-03 (×2): qty 0.6

## 2014-07-03 MED ORDER — SODIUM CHLORIDE 0.9 % IJ SOLN
10.0000 mL | INTRAMUSCULAR | Status: DC | PRN
  Administered 2014-07-03: 10 mL via INTRAVENOUS
  Filled 2014-07-03: qty 10

## 2014-07-03 NOTE — Progress Notes (Addendum)
Results for MYRICLE, MELLING (MRN [id number]) as of 07/03/2014 10:55  Ref. Range 06/28/2014 09:02 06/29/2014 07:46 06/29/2014 21:47 06/30/2014 06:40 06/30/2014 06:46 07/01/2014 07:08 07/02/2014 08:46 07/03/2014 05:02  Hemoglobin A1C Latest Range: 4.8-5.6 % 10.3%   10.6 (H)      Glucose Latest Range: 70-99 mg/dL  281 (H) 434 (H)  346 (H) 233 (H) 268 (H) 470 (H)  07/03/14 Hospital DM meds:  Lantus 50 units bid, resistant correction scale tidac and hs,10 units meal coverage tid  Home DM meds:  Lantus 45-60 units bid, Novolog 45 units tid with meals.  Noted pt had a one time dose of Solu-medrol today.  PO intake is recorded as 75-100%.  Noted plan for NPO on 07/04/14.  When eating, recommend increasing meal coverage to 20 units tid with meals, based on home regimen.  Pt states that her blood sugars were under 200 on the current home regimen before she went on prednisone for 4 months.Pt states that she is now off prednisone.Will follow.  [place], CDE. M.Ed. Pager [phone] Inpatient Diabetes Coordinator

## 2014-07-03 NOTE — Progress Notes (Signed)
ANTICOAGULATION CONSULT NOTE - Initial Consult  Pharmacy Consult for Lovenox Indication: VTE prophylaxis  Allergies  Allergen Reactions  . Latex Shortness Of Breath and Swelling    Facial swelling  . Vancomycin Shortness Of Breath  . Vancomycin Hcl Swelling    REACTION: facial swelling  . Adhesive [Tape] Rash    Plastic tape pulls her skin off when it is removed. Paper tape is ok per patient.  . Quinapril Hcl Swelling and Rash    Arm swelling   Patient Measurements: Height: 5\' 7"  (170.2 cm) Weight: 252 lb (114.306 kg) IBW/kg (Calculated) : 61.6  Vital Signs: Temp: 97.9 F (36.6 C) (04/11 0615) Temp Source: Oral (04/11 0615) BP: 126/54 mmHg (04/11 0615) Pulse Rate: 80 (04/11 0615)  Labs:  Recent Labs  06/30/14 1223  07/01/14 0708 07/01/14 1127  07/02/14 0846 07/02/14 1605 07/03/14 0502  HGB  --   < > 11.7*  --   --  12.1  --  11.9*  HCT  --   --  36.9  --   --  37.2  --  36.3  PLT  --   --  246  --   --  255  --  291  LABPROT  --   --   --  13.4  --   --   --   --   INR  --   --   --  1.01  --   --   --   --   HEPARINUNFRC  --   --   --   --   < > 0.20* 0.26* 0.63  CREATININE  --   --  1.99*  --   --  2.06*  --  2.19*  TROPONINI 0.19*  --  0.30*  --   --  0.30*  --   --   < > = values in this interval not displayed.  Estimated Creatinine Clearance: 33.4 mL/min (by C-G formula based on Cr of 2.19).   Medical History: Past Medical History  Diagnosis Date  . RLS (restless legs syndrome)     Sleep study in 2010: No apnea; moderate periodic movements  . GERD (gastroesophageal reflux disease)   . Upper GI bleed   . Obese   . Gout   . Hypothyroidism   . Low back pain   . History of cellulitis     Neck  . Type 2 diabetes mellitus   . Hyperlipidemia   . Essential hypertension   . Anemia   . Depression   . Headache(784.0)   . Vitiligo   . H/O hiatal hernia   . Arthritis   . History of skin cancer   . COPD (chronic obstructive pulmonary disease)   .  Chronic kidney disease, stage III (moderate)    Assessment: 64yo obese female who was on IV Heparin for chest pain.  ECG show no acute ST segment changes therefore Heparin was d/c'd.  Asked to initiate Lovenox for VTE prophylaxis.  Pt has h/o CKD.  Renal fxn is borderline for dose adjustment.    Goal of Therapy:  VTE prophylaxis Monitor platelets by anticoagulation protocol: Yes   Plan:  Lovenox 0.5mg /Kg SQ q24hrs for clcr ~ 31-35 Monitor platelets and renal fxn.    Hart Robinsons A 07/03/2014,11:00 AM

## 2014-07-03 NOTE — Progress Notes (Signed)
[facility] PROGRESS NOTE  [name] [id number] DOB: 11/21/1950 DOA: 06/29/2014 PCP: Chevis Pretty, FNP    Code Status: Full code Family Communication: discussed with daughter at the bedside Disposition Plan: Discharge when clinically appropriate    Consultants:  Cardiology  Procedures:  2-D echocardiogram: Study Conclusions - Left ventricle: The cavity size was normal. Wall thickness was increased in a pattern of mild LVH. Systolic function was normal. The estimated ejection fraction was in the range of 60% to 65%. Wall motion was normal; there were no regional wall motion abnormalities. Left ventricular diastolic function parameters were normal. - Aortic valve: Valve area (VTI): 1.68 cm^2. Valve area (Vmax): 1.65 cm^2. - Left atrium: The atrium was mildly dilated. - Pulmonary arteries: Systolic pressure was moderately increased. PA peak pressure: 50 mm Hg (S). - Systemic veins: The IVC is dilated with normal respiratory response, estimated RA pressure is 8 mmHg. - Technically difficult study.  Antibiotics:  Doxycycline 4/8>>  HPI/Subjective: No further chest pain. Shortness of breath has improved.  Objective: Filed Vitals:   07/03/14 1308  BP: 135/58  Pulse: 80  Temp: 98 F (36.7 C)  Resp: 20    Intake/Output Summary (Last 24 hours) at 07/03/14 1850 Last data filed at 07/03/14 1747  Gross per 24 hour  Intake    480 ml  Output   2000 ml  Net  -1520 ml   Filed Weights   06/29/14 1900  Weight: 114.306 kg (252 lb)    Exam:   General:  Obese 64 year old woman sitting in chair, in no acute distress.  Cardiovascular: S1, S2, with a soft systolic murmur.  Respiratory:clear lung sounds bilaterally  breathing nonlabored.  Abdomen: Obese, positive bowel sounds, soft, nontender, nondistended.  Musculoskeletal/extremities: Trace of pedal edema bilaterally; bilateral upper extremity edema L>.R  With LUE fistula in place;  multiple abrasions on both upper extremities.  Neurologic: Alert and oriented 3; cranial nerves II through XII are grossly intact.   Data Reviewed: Basic Metabolic Panel:  Recent Labs Lab 06/29/14 2147 06/30/14 0646 07/01/14 0708 07/02/14 0846 07/03/14 0502  NA 132* 138 137 135 133*  K 3.6 3.4* 3.3* 3.8 4.1  CL 92* 95* 96 95* 93*  CO2 30 31 31 30 29   GLUCOSE 434* 346* 233* 268* 470*  BUN 18 17 16 18 23   CREATININE 2.20* 2.09* 1.99* 2.06* 2.19*  CALCIUM 7.8* 8.0* 8.1* 8.4 8.7   Liver Function Tests:  Recent Labs Lab 06/29/14 0746 06/30/14 0646  AST 22 19  ALT 29 27  ALKPHOS 78 72  BILITOT 0.7 0.5  PROT 6.4 6.3  ALBUMIN 3.0* 3.0*   No results for input(s): LIPASE, AMYLASE in the last 168 hours. No results for input(s): AMMONIA in the last 168 hours. CBC:  Recent Labs Lab 06/29/14 0746 06/29/14 2147 06/30/14 0646 07/01/14 0708 07/02/14 0846 07/03/14 0502  WBC 7.9 6.3 6.0 7.4 7.9 14.1*  NEUTROABS 4.6 3.4  --   --   --   --   HGB 12.2 12.3 11.8* 11.7* 12.1 11.9*  HCT 37.7 37.8 36.7 36.9 37.2 36.3  MCV 91.1 92.0 91.8 91.8 91.4 89.9  PLT 247 260 238 246 255 291   Cardiac Enzymes:  Recent Labs Lab 06/30/14 0056 06/30/14 0641 06/30/14 1223 07/01/14 0708 07/02/14 0846  TROPONINI 0.11* 0.16* 0.19* 0.30* 0.30*   BNP (last 3 results) No results for input(s): BNP in the last 8760 hours.  ProBNP (last 3 results)  Recent Labs  10/22/13 1827  PROBNP  2668.0*    CBG:  Recent Labs Lab 07/03/14 0726 07/03/14 0958 07/03/14 1133 07/03/14 1544 07/03/14 1703  GLUCAP 466* 499* 424* 332* 312*    Recent Results (from the past 240 hour(s))  MRSA PCR Screening     Status: None   Collection Time: 06/30/14  1:10 AM  Result Value Ref Range Status   MRSA by PCR NEGATIVE NEGATIVE Final    Comment:        The GeneXpert MRSA Assay (FDA approved for NASAL specimens only), is one component of a comprehensive MRSA colonization surveillance program. It is  not intended to diagnose MRSA infection nor to guide or monitor treatment for MRSA infections.      Studies: No results found.  Scheduled Meds: . amLODipine  5 mg Oral Daily  . aspirin  81 mg Oral Daily  . carvedilol  25 mg Oral BID WC  . citalopram  40 mg Oral Daily  . doxycycline  100 mg Oral Q12H  . enoxaparin (LOVENOX) injection  60 mg Subcutaneous Q24H  . gabapentin  800 mg Oral TID  . guaiFENesin  1,200 mg Oral BID  . insulin aspart  0-20 Units Subcutaneous TID WC  . insulin aspart  0-5 Units Subcutaneous QHS  . insulin aspart  10 Units Subcutaneous TID WC  . insulin glargine  50 Units Subcutaneous BID  . ipratropium-albuterol  3 mL Nebulization QID  . levothyroxine  50 mcg Oral QPM  . mupirocin ointment  1 application Topical BID  . pantoprazole  40 mg Oral Daily  . polyethylene glycol  17 g Oral Daily  . pravastatin  40 mg Oral QHS  . rOPINIRole  1 mg Oral QHS  . sodium chloride  3 mL Intravenous Q12H  . torsemide  40 mg Oral BID   Continuous Infusions:    Assessment and plan:  Principal Problem:   Chest pain Active Problems:   Hypothyroidism   Hypertension   Chronic kidney disease, stage IV (severe)   DM type 2, uncontrolled, with neuropathy   Dyslipidemia associated with type 2 diabetes mellitus   Acute bronchitis   Non-ST elevation MI (NSTEMI)   1. Probable non-ST elevation myocardial infarction.  The patient presented with chest pain and a marginally elevated troponin I. She has a history of CAD. Her Cardiolite stress test in June 2014 which revealed no ischemia. Her EKG on admission revealed normal sinus rhythm with no ST or T-wave abnormalities. However, her troponin I has increased each day. Her 2-D echocardiogram revealed an EF of 60-65% and pulmonary hypertension, but no left ventricular regional wall motion abnormality. The patient was started on aspirin. She is already on carvedilol. Cardiology was consulted 4/8, but apparently the consult did  not go through. -Dr. Caryn Section discussed the patient with Nicki Reaper, cardiology PA on call who in turn discussed the patient with cardiologist, Dr. Aundra Dubin. Per his recommendation, in light of the patient's chronic kidney disease and nonacute EKG and the patient being chest pain free, he would start IV heparin and continue it through the weekend; continue to cycle troponin I; and consult cardiology on Monday for a Myoview stress test. If it is positive, he would recommend cardiac catheterization at [facility] with nephrology input -patient was seen by cardiology and is undergoing a stress test. Unfortunately, since the patient had eaten breakfast this morning, the remainder of the stress test will be conducted in a.m. If this is negative, anticipate that she can discharge home tomorrow. Heparin infusionhas been discontinued.  Hypertension. Currently stable on carvedilol and amlodipine.  Hyperlipidemia. We'll continue statin therapy.  Nonocclusive left upper extremity superficial thrombus. The patient received Lovenox 1 in the ED. VQ scan was ordered and it was low probability for PE.  Acute bronchitis. Appears to have resolved with steroids, nebulizers and antibiotics. Continue current treatments.  Stage IV chronic kidney disease. Her creatinine appears to be at baseline. She does have a left upper extremity AV fistula, but has not started dialysis yet. Continue chronic torsemide.  Insulin-dependent diabetes mellitus with neuropathy. Her CBGs are not optimally controlled. Her hemoglobin A1c was 10.6, indicating poor outpatient control. Blood sugars running in the 400s today. This is likely related to intravenous steroids. She was given extra doses of NovoLog and steroids have been discontinued. Blood sugars are now trending down. Continue to follow.  Hypothyroidism. Continue Synthroid.  Time spent: 30 minutes.    [name]  [facility] Pager [phone] If 7PM-7AM, please contact  night-coverage at [email or web address], password [facility] 07/03/2014, 6:50 PM  LOS: 4 days

## 2014-07-03 NOTE — Progress Notes (Signed)
Patient's CBG this morning was 466.  Dr. Roderic Palau was notified at 0750 and put in orders for 25 units of Novolog and 10 units of meal coverage at 0755.  35 units of novolog was administered at 0835.  Patient's CBG was rechecked at 0958 and it was 499.  Dr. Roderic Palau was notified and orders received for 25 units of Novolog. 25 units of novolog was administered at 1011.  Patient's CBG was 424 when rechecked at 1133.  Dr. Roderic Palau was notified.  No new orders were received.  Patient was administered 20 units of Novolog based on sliding scale just before going to nuclear medicine for a stress test.  Will continue to monitor patient.

## 2014-07-03 NOTE — Consult Note (Signed)
CARDIOLOGY CONSULT NOTE   Patient ID: [name] MRN: [id number] DOB/AGE: [birth year] 65 y.o.  Admit Date: 06/29/2014 Referring Physician:[facility]-Memon  Primary Physician: Chevis Pretty, FNP Consulting Cardiologist: Rozann Lesches MD Primary Cardiologist: Carlyle Dolly MD Reason for Consultation: Chest Pain  Clinical Summary [name] is a morbildy obese 64 y.o.female with known history of chronic DOE with PFT's showing severe restriction and moderate diffusion defect referred to pulmonology, Diabetes, Hypertension, Hyperlipidemia, CKD stage III-IV  not yet on dialysis, who presented to ER with complaints of anterior chest pain and dyspnea.  She came to ER last week with nausea and vomiting with dyspnea after reported episodes of bronchitis. She was placed on O2 and given meds for nausea. After going home she went to bed and had to sit back up due to chest pain, described as a deep heaviness. She returned to ER.   On second trip to ER, BP 145/65, HR 89, O2 Sat 90%, Temp 99.5. CBC normal, Na 132, K+3.2, Glucose 434, Creatinine 2.20, Troponin 0.09 (peak 0.30).  NSR rate of 82 bpm. VQ scan negative for PE. CXR was negative for acute cardiopulmonary process. She was admitted to rule out cardiac etiology of chest pain with mildly elevated troponin.    Allergies  Allergen Reactions  . Latex Shortness Of Breath and Swelling    Facial swelling  . Vancomycin Shortness Of Breath  . Vancomycin Hcl Swelling    REACTION: facial swelling  . Adhesive [Tape] Rash    Plastic tape pulls her skin off when it is removed. Paper tape is ok per patient.  . Quinapril Hcl Swelling and Rash    Arm swelling    Medications Scheduled Medications: . amLODipine  5 mg Oral Daily  . aspirin  81 mg Oral Daily  . carvedilol  25 mg Oral BID WC  . citalopram  40 mg Oral Daily  . doxycycline  100 mg Oral Q12H  . gabapentin  800 mg Oral TID  . guaiFENesin  1,200 mg Oral BID  . insulin aspart  0-20  Units Subcutaneous TID WC  . insulin aspart  0-5 Units Subcutaneous QHS  . insulin aspart  10 Units Subcutaneous TID WC  . insulin glargine  50 Units Subcutaneous BID  . ipratropium-albuterol  3 mL Nebulization QID  . levothyroxine  50 mcg Oral QPM  . methylPREDNISolone (SOLU-MEDROL) injection  60 mg Intravenous Q12H  . mupirocin ointment  1 application Topical BID  . pantoprazole  40 mg Oral Daily  . polyethylene glycol  17 g Oral Daily  . pravastatin  40 mg Oral QHS  . [START ON 07/04/2014] regadenoson  0.4 mg Intravenous Once  . rOPINIRole  1 mg Oral QHS  . sodium chloride  3 mL Intravenous Q12H  . torsemide  40 mg Oral BID    PRN Medications: sodium chloride, albuterol, morphine injection, ondansetron **OR** ondansetron (ZOFRAN) IV, sodium chloride   Past Medical History  Diagnosis Date  . RLS (restless legs syndrome)     Sleep study in 2010: No apnea; moderate periodic movements  . GERD (gastroesophageal reflux disease)   . Upper GI bleed   . Obese   . Gout   . Hypothyroidism   . Low back pain   . History of cellulitis     Neck  . Type 2 diabetes mellitus   . Hyperlipidemia   . Essential hypertension   . Anemia   . Depression   . Headache(784.0)   . Vitiligo   .  H/O hiatal hernia   . Arthritis   . History of skin cancer   . COPD (chronic obstructive pulmonary disease)   . Chronic kidney disease, stage III (moderate)     Past Surgical History  Procedure Laterality Date  . Appendectomy    . Carpal tunnel release  1992, 1993, 2011    Right in 2011; previously bilateral  . Tubal ligation  1976  . Partial hysterectomy  1985    Vaginal  . Shoulder surgery  1997    Left  . Incise and drain abcess  2010    Neck-MRSA  . Colonoscopy  02/2002, 07/2009    few diverticula, otherwise negative  . Foot surgery  1984    Bilateral heel spurs  . Abdominal hysterectomy    . Shoulder surgery      RIGHT  ADJUSTED   . Av fistula placement Left 07/16/2012    Procedure:  ARTERIOVENOUS (AV) FISTULA CREATION;  Surgeon: Mal Misty, MD;  Location: [place];  Service: Vascular;  Laterality: Left;  . Av fistula placement Left 07/29/2012    Procedure: ARTERIOVENOUS (AV) FISTULA CREATION;  Surgeon: Mal Misty, MD;  Location: [place];  Service: Vascular;  Laterality: Left;  Left Brachial-Cephalic Arteriovenous Fistula Creation  . Colonoscopy with esophagogastroduodenoscopy (egd) N/A 08/27/2012    RMR: Normal esophagus s/p 54 F dilator. Small hiatal hernia. Gastric polyp removed, oozing (hyperplastic), which could explain heme positive stool:Colonic diverticulosis  . Revison of arteriovenous fistula Left 08/03/2013    Procedure: BANDING OF BRACHIOCEPHALIC ARTERIOVENOUS FISTULA;  Surgeon: Conrad [place], MD;  Location: [place];  Service: Vascular;  Laterality: Left;  . Thrombectomy w/ embolectomy Left 08/03/2013    Procedure: THROMBECTOMY OF ARTERIOVENOUS FISTULA  AND REVISION OF  ARTERIOVENOUS FISTULA BANDING.;  Surgeon: Conrad Newberry, MD;  Location: [place];  Service: Vascular;  Laterality: Left;  . Esophagogastroduodenoscopy N/A 10/27/2013    Dr.Rourk- esophageal mucosa appeared normal. tubular esophagus widely patent throughout its course. stomach empty. patient had longitudinal proximal gastric ersions overlying multiple proximal folds. nodular mucosa with overlying antral erosions as well. no ulcer or infiltrating process seen patent pylorus, normal first and second portion of the duodenum. bx= reactive gastropathy  . Savory dilation N/A 10/27/2013    Procedure: SAVORY DILATION;  Surgeon: Daneil Dolin, MD;  [facility];  Service: Endoscopy;  Laterality: N/A;  Venia Minks dilation N/A 10/27/2013    Procedure: Venia Minks DILATION;  Surgeon: Daneil Dolin, MD;  [facility];  Service: Endoscopy;  Laterality: N/A;  . Shuntogram Left 08/01/2013    Procedure: FISTULOGRAM;  Surgeon: Conrad [place], MD;  Location: [facility];  Service: Cardiovascular;  Laterality: Left;     Family History  Problem Relation Age of Onset  . Emphysema Mother   . COPD Mother   . Colon cancer Father   . Diabetes Father   . Colon cancer Father   . Hypertension Father   . Congestive Heart Failure Maternal Grandmother   . Cancer Paternal Grandmother   . Rheum arthritis Sister   . Colon cancer Brother 39    Social History Ms. Deuser reports that she has never smoked. She has never used smokeless tobacco. Ms. Ohlin reports that she does not drink alcohol.  Review of Systems Complete review of systems are found to be negative unless outlined in H&P above.  Physical Examination Blood pressure 126/54, pulse 80, temperature 97.9 F (36.6 C), temperature source Oral, resp. rate 20, height  5\' 7"  (1.702 m), weight 252 lb (114.306 kg), SpO2 93 %.  Intake/Output Summary (Last 24 hours) at 07/03/14 0903 Last data filed at 07/02/14 1800  Gross per 24 hour  Intake    480 ml  Output      0 ml  Net    480 ml    Telemetry: NSR with artifact.  GEN: Obese, chronically ill-appearing, no acute distress HEENT: Conjunctiva and lids normal, oropharynx clear. Neck: Supple, no elevated JVP or carotid bruits, no thyromegaly. Lungs: Clear to auscultation decreased at left base, nonlabored breathing at rest. Cardiac: Regular rate and rhythm, no S3 or significant systolic murmur, no pericardial rub. Abdomen: Soft, nontender, obese, bowel sounds present, no guarding or rebound. Extremities: No pitting edema, distal pulses 2+. Skin: Warm and dry. Multiple tattoo's.  Musculoskeletal: No kyphosis. Neuropsychiatric: Alert and oriented x3, affect grossly appropriate.  Prior Cardiac Testing/Procedures  1. 09/06/12 MPI:  Scintigraphic Data: Acquisition notable for mild breast attenuation, moderate subdiaphragmatic activity adjacent to the inferior myocardium and normal left ventricular size. On tomographic images reconstructed in standard planes, there was a small defect of borderline  numeric significance in the mid and distal anteroseptal segment. By comparison to the resting portion of the study, no reversibility was apparent. The gated reconstruction demonstrated normal regional and global LV systolic function as well as normal systolic accentuation of activity throughout. Estimated ejection fraction was 65%.  IMPRESSION: Negative combined low level exercise and pharmacologic stress nuclear myocardial study. Mild breast attenuation was noted without evidence for ischemia or infarction. There was no electrocardiographic evidence for myocardial hypoperfusion nor were abnormalities apparent by scintigraphic imaging. Other findings as Noted.  2.Echocardiogram: 06/30/2014 Left ventricle: The cavity size was normal. Wall thickness was increased in a pattern of mild LVH. Systolic function was normal. The estimated ejection fraction was in the range of 60% to 65%. Wall motion was normal; there were no regional wall motion abnormalities. Left ventricular diastolic function parameters were normal. - Aortic valve: Valve area (VTI): 1.68 cm^2. Valve area (Vmax): 1.65 cm^2. - Left atrium: The atrium was mildly dilated. - Pulmonary arteries: Systolic pressure was moderately increased. PA peak pressure: 50 mm Hg (S). - Systemic veins: The IVC is dilated with normal respiratory response, estimated RA pressure is 8 mmHg. - Technically difficult study.  Lab Results  Basic Metabolic Panel:  Recent Labs Lab 06/29/14 2147 06/30/14 0646 07/01/14 0708 07/02/14 0846 07/03/14 0502  NA 132* 138 137 135 133*  K 3.6 3.4* 3.3* 3.8 4.1  CL 92* 95* 96 95* 93*  CO2 30 31 31 30 29   GLUCOSE 434* 346* 233* 268* 470*  BUN 18 17 16 18 23   CREATININE 2.20* 2.09* 1.99* 2.06* 2.19*  CALCIUM 7.8* 8.0* 8.1* 8.4 8.7    Liver Function Tests:  Recent Labs Lab 06/29/14 0746 06/30/14 0646  AST 22 19  ALT 29 27  ALKPHOS 78 72  BILITOT 0.7 0.5  PROT 6.4 6.3  ALBUMIN  3.0* 3.0*    CBC:  Recent Labs Lab 06/29/14 0746 06/29/14 2147 06/30/14 0646 07/01/14 0708 07/02/14 0846 07/03/14 0502  WBC 7.9 6.3 6.0 7.4 7.9 14.1*  NEUTROABS 4.6 3.4  --   --   --   --   HGB 12.2 12.3 11.8* 11.7* 12.1 11.9*  HCT 37.7 37.8 36.7 36.9 37.2 36.3  MCV 91.1 92.0 91.8 91.8 91.4 89.9  PLT 247 260 238 246 255 291    Cardiac Enzymes:  Recent Labs Lab 06/30/14 0056 06/30/14 [id number]  06/30/14 1223 07/01/14 0708 07/02/14 0846  TROPONINI 0.11* 0.16* 0.19* 0.30* 0.30*    Radiology: 06/29/2014 There is chronic, mild peribronchial thickening. No consolidative process, pneumothorax or effusion is identified. Heart size is mildly enlarged. Chronic, mild elevation the right hemidiaphragm relative to the left is unchanged.  IMPRESSION: No acute finding.   ECG: NSR    Impression and Recommendations  1. Chest Pain: Typical and atypical features, Described as chest pressure and tightness after bout of nausea and vomiting. She states that pain is coming and going now but not as severe. She has been placed on heparin. ECG shows no acute ST segment changes. Troponin I slightly elevated without substantial trend, although also in setting of chronic kidney disease.. She is now on heparin gtt. I think we can d/c this. Will plan Lexican/NM study this am as she has been kept NPO for evaluation of ischemic burden.  2. Dyspnea: Hx of COPD. She is hypoxic without O2. Consider hypoventilation syndrome in the setting of morbid obesity along with hx of COPD. Question need for home O2   3. Type II diabetes: Very elevated blood glucose on admission. Treatment per PTH.   4. Hypertension: Much better controlled on amlodipine. No ACE in the setting of renal insufficiency.    Signed: Phill Myron. Lawrence NP [place]  07/03/2014, 9:03 AM Co-Sign MD   Attending note:  Patient seen and examined. Reviewed records and discussed the case with Mrs. Lawrence NP. I agree with her above  findings. Ms. Newville is a patient of Dr. Harl Bowie, last seen in July 2015. She has been managed medically with history of hypertension, hyperlipidemia, type 2 diabetes mellitus, and CKD stage 3-4 status post left arm AV fistula although not requiring hemodialysis as yet. There is questionable history of remote "light heart attack" although no clearly documented obstructive CAD, last ischemic workup was via Cardiolite in 2014 was negative for ischemia. She presents with recent symptoms including reported "bronchitis" followed by episode of nausea and emesis, and then subsequent to this intermittent recurring chest pressure. ECG shows no acute ST segment changes, troponin I levels have been mildly increased although without substantial trend, peak 0.30. She also had a VQ scan which was low probability for pulmonary embolus. Patient discussed with on-call cardiology team over the weekend and we were consulted today. On examination this morning she is comfortable, no distress, lungs are clear with decreased breath sounds, cardiac exam with RRR and no gallop or rub. Plan is to proceed with a Lexiscan Cardiolite as an inpatient today for reassessment of ischemic burden. We will not plan to pursue diagnostic heart catheterization unless there are significant ischemic territories at risk in light of her CKD and high risk of contrast nephropathy.  Satira Sark, M.D., F.A.C.C.

## 2014-07-03 NOTE — Progress Notes (Signed)
ANTICOAGULATION CONSULT NOTE  Pharmacy Consult for Heparin Indication: Chest Pain / ACS  Allergies  Allergen Reactions  . Latex Shortness Of Breath and Swelling    Facial swelling  . Vancomycin Shortness Of Breath  . Vancomycin Hcl Swelling    REACTION: facial swelling  . Adhesive [Tape] Rash    Plastic tape pulls her skin off when it is removed. Paper tape is ok per patient.  . Quinapril Hcl Swelling and Rash    Arm swelling   Patient Measurements: Height: 5\' 7"  (170.2 cm) Weight: 252 lb (114.306 kg) IBW/kg (Calculated) : 61.6 HEPARIN DW (KG): 88.2  Vital Signs: Temp: 97.9 F (36.6 C) (04/11 0615) Temp Source: Oral (04/11 0615) BP: 126/54 mmHg (04/11 0615) Pulse Rate: 80 (04/11 0615)  Labs:  Recent Labs  06/30/14 1223  07/01/14 0708 07/01/14 1127  07/02/14 0846 07/02/14 1605 07/03/14 0502  HGB  --   < > 11.7*  --   --  12.1  --  11.9*  HCT  --   --  36.9  --   --  37.2  --  36.3  PLT  --   --  246  --   --  255  --  291  LABPROT  --   --   --  13.4  --   --   --   --   INR  --   --   --  1.01  --   --   --   --   HEPARINUNFRC  --   --   --   --   < > 0.20* 0.26* 0.63  CREATININE  --   --  1.99*  --   --  2.06*  --  2.19*  TROPONINI 0.19*  --  0.30*  --   --  0.30*  --   --   < > = values in this interval not displayed.  Estimated Creatinine Clearance: 33.4 mL/min (by C-G formula based on Cr of 2.19).  Medical History: Past Medical History  Diagnosis Date  . Chest pain 2003    ?h/o MI, syncope; 2003 stress nuclear-inferoseptal scar, EF-54%; 2007-MI ruled out, echocardiogram-normal LV size, wall thickness and function  . RLS (restless legs syndrome)     sleep study in 2010: No apnea; moderate periodic movements  . GERD (gastroesophageal reflux disease)     H/o upper GI bleed  . GI bleeding   . Obese   . Gout   . Hypothyroidism   . Low back pain   . Cellulitis     Neck  . Diabetes mellitus 1998    A1c-7.1 in 2010, 6.7 in 2013  . Hyperlipidemia    Fasting lipids 2013:158, 152, 35, 93  . Hypertension 2002  . Anemia   . Hyperkalemia   . Irregular heart beat   . PONV (postoperative nausea and vomiting)   . Myocardial infarction   . CAD (coronary artery disease)     dr. Lattie Haw (Linna Hoff)  . Depression     Insomnia  . Headache(784.0)   . Vitiligo   . H/O hiatal hernia   . Arthritis   . Cancer     HX SKIN CANCER  . Anginal pain     last cp few months ago.  Marland Kitchen Dysrhythmia     "skips [name] beat"  . COPD (chronic obstructive pulmonary disease)   . Chronic kidney disease, stage III (moderate)     Creat-1.9 in 04/2008, 2.65 in 02/2010(noHD  . Renal  insufficiency   . Neuropathy    Medications:  Scheduled:  . amLODipine  5 mg Oral Daily  . aspirin  81 mg Oral Daily  . carvedilol  25 mg Oral BID WC  . citalopram  40 mg Oral Daily  . doxycycline  100 mg Oral Q12H  . gabapentin  800 mg Oral TID  . guaiFENesin  1,200 mg Oral BID  . insulin aspart  0-20 Units Subcutaneous TID WC  . insulin aspart  0-5 Units Subcutaneous QHS  . insulin aspart  4 Units Subcutaneous TID WC  . insulin glargine  50 Units Subcutaneous BID  . ipratropium-albuterol  3 mL Nebulization QID  . levothyroxine  50 mcg Oral QPM  . methylPREDNISolone (SOLU-MEDROL) injection  60 mg Intravenous Q12H  . mupirocin ointment  1 application Topical BID  . pantoprazole  40 mg Oral Daily  . polyethylene glycol  17 g Oral Daily  . pravastatin  40 mg Oral QHS  . rOPINIRole  1 mg Oral QHS  . sodium chloride  3 mL Intravenous Q12H  . torsemide  40 mg Oral BID   Assessment: Okay for Protocol, Hx CAD.  Plan to heparinize through the weekend and consult cardiology on Monday.  No bleeding noted.  CBC stable.  Baseline PT/INR WNL.   Heparin level is therapeutic this morning.    Goal of Therapy:  Heparin level 0.3-0.7 units/ml Monitor platelets by anticoagulation protocol: Yes   Plan:   Continue heparin infusion at 1700 units/hr  Check anti-Xa level daily while on  heparin  CBC daily while on Heparin  [name], [name] 07/03/2014,7:47 AM

## 2014-07-03 NOTE — Progress Notes (Signed)
Stress Lab Nurses Notes - Michelle Na  [name] 07/03/2014 Reason for doing test: Chest Pain and Dyspnea Type of test: Wille Glaser / Inpatient Rm 313 Nurse performing test: Gerrit Halls, RN Nuclear Medicine Tech: Melburn Hake Echo Tech: Not Applicable MD performing test: S. McDowell/K.Purcell Nails NP Family MD: Dr. Paulita Cradle.[facility] FNP Test explained and consent signed: Yes.   IV started: Saline lock flushed, No redness or edema and Saline lock from floor Symptoms: pressure in stomach  Treatment/Intervention: None Reason test stopped: protocol completed After recovery IV was: No redness or edema and Saline Lock flushed Patient to return to [facility]. Med at : 14:00 Patient discharged: Transported back to room 313 via wc Patient's Condition upon discharge was: stable Comments: During test BP 112/50 & HR 83.  Recovery BP 119/57 & HR.80  Symptoms resolved in recovery. [name] T

## 2014-07-04 ENCOUNTER — Inpatient Hospital Stay (HOSPITAL_COMMUNITY): Payer: Commercial Managed Care - HMO

## 2014-07-04 DIAGNOSIS — R079 Chest pain, unspecified: Secondary | ICD-10-CM

## 2014-07-04 LAB — BASIC METABOLIC PANEL
Anion gap: 10 (ref 5–15)
BUN: 31 mg/dL — AB (ref 6–23)
CHLORIDE: 94 mmol/L — AB (ref 96–112)
CO2: 33 mmol/L — AB (ref 19–32)
Calcium: 8.9 mg/dL (ref 8.4–10.5)
Creatinine, Ser: 2.13 mg/dL — ABNORMAL HIGH (ref 0.50–1.10)
GFR calc Af Amer: 27 mL/min — ABNORMAL LOW (ref >90)
GFR calc non Af Amer: 23 mL/min — ABNORMAL LOW (ref >90)
GLUCOSE: 312 mg/dL — AB (ref 70–99)
Potassium: 3.4 mmol/L — ABNORMAL LOW (ref 3.5–5.1)
Sodium: 137 mmol/L (ref 135–145)

## 2014-07-04 LAB — GLUCOSE, CAPILLARY
GLUCOSE-CAPILLARY: 314 mg/dL — AB (ref 70–99)
Glucose-Capillary: 250 mg/dL — ABNORMAL HIGH (ref 70–99)

## 2014-07-04 LAB — CBC
HCT: 36.7 % (ref 36.0–46.0)
Hemoglobin: 12.2 g/dL (ref 12.0–15.0)
MCH: 30.2 pg (ref 26.0–34.0)
MCHC: 33.2 g/dL (ref 30.0–36.0)
MCV: 90.8 fL (ref 78.0–100.0)
PLATELETS: 321 10*3/uL (ref 150–400)
RBC: 4.04 MIL/uL (ref 3.87–5.11)
RDW: 14.6 % (ref 11.5–15.5)
WBC: 14.3 10*3/uL — ABNORMAL HIGH (ref 4.0–10.5)

## 2014-07-04 MED ORDER — TECHNETIUM TC 99M SESTAMIBI - CARDIOLITE
25.0000 | Freq: Once | INTRAVENOUS | Status: AC | PRN
  Administered 2014-07-04: 25 via INTRAVENOUS

## 2014-07-04 MED ORDER — IPRATROPIUM-ALBUTEROL 0.5-2.5 (3) MG/3ML IN SOLN: 3.0000 mL | Freq: Three times a day (TID) | RESPIRATORY_TRACT | Status: DC

## 2014-07-04 MED ORDER — GUAIFENESIN ER 600 MG PO TB12: 600.0000 mg | ORAL_TABLET | Freq: Two times a day (BID) | ORAL | Status: DC

## 2014-07-04 NOTE — Discharge Summary (Signed)
Physician Discharge Summary  [name] [id number] DOB: Jan 24, 1951 DOA: 06/29/2014  PCP: Chevis Pretty, FNP  Admit date: 06/29/2014 Discharge date: 07/04/2014  Time spent:62minutes  Recommendations for Outpatient Follow-up:  1. Follow up with primary care physician in 1-2 weeks 2. Follow up with cardiology in the next few weeks  Discharge Diagnoses:  Principal Problem:   Chest pain Active Problems:   Hypothyroidism   Hypertension   Chronic kidney disease, stage IV (severe)   DM type 2, uncontrolled, with neuropathy   Dyslipidemia associated with type 2 diabetes mellitus   Acute bronchitis   Non-ST elevation MI (NSTEMI)   Discharge Condition: improved  Diet recommendation: low salt, low carb  Filed Weights   06/29/14 1900  Weight: 114.306 kg (252 lb)    History of present illness:  This patient presents to the hospital with complaints of chest pain. She also had associated shortness of breath. She was evaluated in the ED and found to have a nonocclusive superficial thrombus in her left upper extremity. Her chest pain persisted and therefore she was admitted for further treatments.  Hospital Course:  1. Probable non-ST elevation myocardial infarction.  The patient presented with chest pain and a marginally elevated troponin I. She has a history of CAD. Her Cardiolite stress test in June 2014 which revealed no ischemia. Her EKG on admission revealed normal sinus rhythm with no ST or T-wave abnormalities. However, her troponin I has increased each day. Her 2-D echocardiogram revealed an EF of 60-65% and pulmonary hypertension, but no left ventricular regional wall motion abnormality. The patient was started on aspirin. She is already on carvedilol. Cardiology was consulted 4/8, but apparently the consult did not go through. -Dr. Caryn Section discussed the patient with Nicki Reaper, cardiology PA on call who in turn discussed the patient with cardiologist, Dr. Aundra Dubin. Per his  recommendation, in light of the patient's chronic kidney disease and nonacute EKG and the patient being chest pain free, she was started on IV heparin and it was continued through the weekend; patient was seen by cardiology at [facility] on 4/11 was scheduled for stress testing. Heparin was discontinued at that time. Stress test was found to be negative for acute ischemia. Patient did not have any further chest pain. She was scheduled to follow-up with cardiology in the next few weeks to reassess her cardiac status.  Hypertension. Currently stable on carvedilol and amlodipine.  Hyperlipidemia. We'll continue statin therapy.  Nonocclusive left upper extremity superficial thrombus. The patient received Lovenox 1 in the ED. VQ scan was ordered and it was low probability for PE. This was treated supportively.  Acute bronchitis. Patient was treated with steroids, nebulizers and antibiotics. She no longer has any shortness of breath or wheezing  Stage IV chronic kidney disease. Her creatinine appears to be at baseline. She does have a left upper extremity AV fistula, but has not started dialysis yet. Continue chronic torsemide.  Insulin-dependent diabetes mellitus with neuropathy. She did have uncontrolled blood sugars when she was started on intravenous steroids. When steroids were discontinued, her blood sugar started to return to baseline. This can be further managed in the outpatient setting.  Hypothyroidism. Patient was continued on  Synthroid.  Procedures:  Echo: - Left ventricle: The cavity size was normal. Wall thickness was increased in a pattern of mild LVH. Systolic function was normal. The estimated ejection fraction was in the range of 60% to 65%. Wall motion was normal; there were no regional wall motion abnormalities. Left ventricular diastolic  function parameters were normal. - Aortic valve: Valve area (VTI): 1.68 cm^2. Valve area (Vmax): 1.65 cm^2. - Left  atrium: The atrium was mildly dilated. - Pulmonary arteries: Systolic pressure was moderately increased. PA peak pressure: 50 mm Hg (S). - Systemic veins: The IVC is dilated with normal respiratory response, estimated RA pressure is 8 mmHg. - Technically difficult study.  Consultations:  Cardiology  Discharge Exam: Filed Vitals:   07/04/14 0637  BP: 131/58  Pulse: 80  Temp: 98.4 F (36.9 C)  Resp: 20    General: NAD Cardiovascular: S1, S2 RRR Respiratory: CTA B  Discharge Instructions   Discharge Instructions    Call MD for:  difficulty breathing, headache or visual disturbances    Complete by:  As directed      Call MD for:  severe uncontrolled pain    Complete by:  As directed      Diet - low sodium heart healthy    Complete by:  As directed      Diet Carb Modified    Complete by:  As directed      Increase activity slowly    Complete by:  As directed           Discharge Medication List as of 07/04/2014  1:06 PM    START taking these medications   Details  guaiFENesin (MUCINEX) 600 MG 12 hr tablet Take 1 tablet (600 mg total) by mouth 2 (two) times daily., Starting 07/04/2014, Until Discontinued, Normal      CONTINUE these medications which have NOT CHANGED   Details  albuterol (PROAIR HFA) 108 (90 BASE) MCG/ACT inhaler Inhale 2 puffs into the lungs every 6 (six) hours as needed for wheezing or shortness of breath., Starting 07/04/2013, Until Discontinued, Normal    allopurinol (ZYLOPRIM) 100 MG tablet Take 1 tablet (100 mg total) by mouth every morning., Starting 07/04/2013, Until Discontinued, Normal    amLODipine (NORVASC) 5 MG tablet TAKE ONE TABLET BY MOUTH ONCE DAILY IN THE EVENING, Normal    carvedilol (COREG) 25 MG tablet Take 1 tablet (25 mg total) by mouth 2 (two) times daily with a meal., Starting 07/04/2013, Until Discontinued, Normal    citalopram (CELEXA) 40 MG tablet Take 1 tablet (40 mg total) by mouth daily., Starting 03/30/2014, Until  Discontinued, Normal    ferrous sulfate 325 (65 FE) MG tablet TAKE ONE TABLET BY MOUTH ONCE DAILY WITH BREAKFAST, Normal    Fluticasone Furoate-Vilanterol (BREO ELLIPTA) 100-25 MCG/INH AEPB Inhale 1 puff into the lungs 2 (two) times daily., Until Discontinued, Historical Med    gabapentin (NEURONTIN) 800 MG tablet TAKE ONE TABLET BY MOUTH THREE TIMES DAILY, Normal    insulin aspart (NOVOLOG FLEXPEN) 100 UNIT/ML FlexPen Inject 45 Units into the skin 3 (three) times daily with meals., Starting 06/06/2014, Until Discontinued, Normal    Insulin Glargine (LANTUS SOLOSTAR) 100 UNIT/ML Solostar Pen Inject 65 units SQ prior to breakfast and 40 units prior to evening meal, Normal    levothyroxine (SYNTHROID, LEVOTHROID) 50 MCG tablet Take 1 tablet (50 mcg total) by mouth every evening., Starting 03/30/2014, Until Discontinued, Normal    linagliptin (TRADJENTA) 5 MG TABS tablet Take 1 tablet (5 mg total) by mouth daily., Starting 03/30/2014, Until Discontinued, Normal    mupirocin ointment (BACTROBAN) 2 % Apply 1 application topically 2 (two) times daily., Starting 05/03/2014, Until Discontinued, Normal    nitroGLYCERIN (NITROSTAT) 0.4 MG SL tablet Place 1 tablet (0.4 mg total) under the tongue every 5 (five) minutes as  needed for chest pain., Starting 09/30/2013, Until Discontinued, Normal    omeprazole (PRILOSEC) 20 MG capsule Take 1 capsule (20 mg total) by mouth daily., Starting 03/30/2014, Until Discontinued, Normal    potassium chloride (K-DUR,KLOR-CON) 10 MEQ tablet Take 1 tablet (10 mEq total) by mouth 2 (two) times daily., Starting 03/30/2014, Until Discontinued, Normal    pravastatin (PRAVACHOL) 40 MG tablet Take 1 tablet (40 mg total) by mouth at bedtime., Starting 05/22/2014, Until Discontinued, Normal    rOPINIRole (REQUIP) 1 MG tablet Take 1 tablet (1 mg total) by mouth at bedtime., Starting 07/04/2013, Until Discontinued, Normal    sodium bicarbonate 650 MG tablet Take 1 tablet (650 mg total) by  mouth 3 (three) times daily., Starting 03/30/2014, Until Discontinued, Normal    tiotropium (SPIRIVA HANDIHALER) 18 MCG inhalation capsule Place 1 capsule (18 mcg total) into inhaler and inhale every morning., Starting 03/30/2014, Until Discontinued, Normal    torsemide (DEMADEX) 20 MG tablet Take 40 mg by mouth 2 (two) times daily. , Until Discontinued, Historical Med    HYDROcodone-acetaminophen (NORCO) 10-325 MG per tablet Take 1 tablet by mouth every 6 (six) hours as needed., Starting 06/28/2014, Until Discontinued, Print    ondansetron (ZOFRAN ODT) 8 MG disintegrating tablet 8mg  ODT q4 hours prn nausea, Print      STOP taking these medications     losartan (COZAAR) 50 MG tablet      doxycycline (VIBRA-TABS) 100 MG tablet      sulfamethoxazole-trimethoprim (SEPTRA DS) 800-160 MG per tablet        Allergies  Allergen Reactions  . Latex Shortness Of Breath and Swelling    Facial swelling  . Vancomycin Shortness Of Breath  . Vancomycin Hcl Swelling    REACTION: facial swelling  . Adhesive [Tape] Rash    Plastic tape pulls her skin off when it is removed. Paper tape is ok per patient.  . Quinapril Hcl Swelling and Rash    Arm swelling   Follow-up Information    Follow up with Jory Sims, NP On 07/18/2014.   Specialty:  Nurse Practitioner   Why:  at 2:10 pm   Contact information:   [place] [address] [phone]        The results of significant diagnostics from this hospitalization (including imaging, microbiology, ancillary and laboratory) are listed below for reference.    Significant Diagnostic Studies: Dg Chest 2 View  06/29/2014   CLINICAL DATA:  Cough and congestion for 2-3 days.  Emphysema.  EXAM: CHEST  2 VIEW  COMPARISON:  PA and lateral chest 10/22/2013.  FINDINGS: There is chronic, mild peribronchial thickening. No consolidative process, pneumothorax or effusion is identified. Heart size is mildly enlarged. Chronic, mild elevation the right  hemidiaphragm relative to the left is unchanged.  IMPRESSION: No acute finding.  Chronic bronchitic change.   Electronically Signed   By: Inge Rise M.D.   On: 06/29/2014 08:02   Nm Pulmonary Perf And Vent  06/30/2014   CLINICAL DATA:  Shortness of breath and chest pain. History of emphysema.  EXAM: NUCLEAR MEDICINE VENTILATION - PERFUSION LUNG SCAN  TECHNIQUE: Ventilation images were obtained in multiple projections using inhaled aerosol technetium 99 M DTPA. Perfusion images were obtained in multiple projections after intravenous injection of Tc-74m MAA.  RADIOPHARMACEUTICALS:  40 mCi Tc-9m DTPA aerosol and 6 mCi Tc-50m MAA  COMPARISON:  Chest radiographs 06/29/2014  FINDINGS: Ventilation: Mild heterogeneous distribution tracer activity throughout the lungs compatible with history of emphysema. Pooling  of tracer in the central airways.  Perfusion: Normal homogeneous distribution of tracer activity throughout both lungs. No focal defects identified.  IMPRESSION: Low probability of pulmonary embolus.   Electronically Signed   By: Lucienne Capers M.D.   On: 06/30/2014 02:36   US Venous Img Upper Uni Left  06/29/2014   CLINICAL DATA:  Left upper extremity swelling for 2 weeks.  EXAM: Left UPPER EXTREMITY VENOUS DOPPLER ULTRASOUND  TECHNIQUE: Gray-scale sonography with graded compression, as well as color Doppler and duplex ultrasound were performed to evaluate the upper extremity deep venous system from the level of the subclavian vein and including the jugular, axillary, basilic, radial, ulnar and upper cephalic vein. Spectral Doppler was utilized to evaluate flow at rest and with distal augmentation maneuvers.  COMPARISON:  None.  FINDINGS: Contralateral Subclavian Vein: Respiratory phasicity is normal and symmetric with the symptomatic side. No evidence of thrombus. Normal compressibility.  Internal Jugular Vein: No evidence of thrombus. Normal compressibility, respiratory phasicity and response to  augmentation.  Subclavian Vein: No evidence of thrombus. Normal compressibility, respiratory phasicity and response to augmentation.  Axillary Vein: No evidence of thrombus. Normal compressibility, respiratory phasicity and response to augmentation.  Cephalic Vein: Partial compressibility of this vein in the region of the antecubital fossa is noted with flow consistent with nonocclusive thrombus.  Basilic Vein: No evidence of thrombus. Normal compressibility, respiratory phasicity and response to augmentation.  Brachial Veins: No evidence of thrombus. Normal compressibility, respiratory phasicity and response to augmentation.  Radial Veins: No evidence of thrombus. Normal compressibility, respiratory phasicity and response to augmentation.  Ulnar Veins: No evidence of thrombus. Normal compressibility, respiratory phasicity and response to augmentation.  Venous Reflux:  None visualized.  Other Findings: Patient reportedly has fistula in the region of the left antecubital fossa.  IMPRESSION: Nonocclusive thrombus is noted in the left cephalic vein in the antecubital fossa. No other thrombus is noted in the left upper extremity.   Electronically Signed   By: Marijo Conception, M.D.   On: 06/29/2014 13:00    Microbiology: Recent Results (from the past 240 hour(s))  MRSA PCR Screening     Status: None   Collection Time: 06/30/14  1:10 AM  Result Value Ref Range Status   MRSA by PCR NEGATIVE NEGATIVE Final    Comment:        The GeneXpert MRSA Assay (FDA approved for NASAL specimens only), is one component of a comprehensive MRSA colonization surveillance program. It is not intended to diagnose MRSA infection nor to guide or monitor treatment for MRSA infections.      Labs: Basic Metabolic Panel:  Recent Labs Lab 06/30/14 0646 07/01/14 0708 07/02/14 0846 07/03/14 0502 07/04/14 0613  NA 138 137 135 133* 137  K 3.4* 3.3* 3.8 4.1 3.4*  CL 95* 96 95* 93* 94*  CO2 31 31 30 29  33*  GLUCOSE 346*  233* 268* 470* 312*  BUN 17 16 18 23  31*  CREATININE 2.09* 1.99* 2.06* 2.19* 2.13*  CALCIUM 8.0* 8.1* 8.4 8.7 8.9   Liver Function Tests:  Recent Labs Lab 06/29/14 0746 06/30/14 0646  AST 22 19  ALT 29 27  ALKPHOS 78 72  BILITOT 0.7 0.5  PROT 6.4 6.3  ALBUMIN 3.0* 3.0*   No results for input(s): LIPASE, AMYLASE in the last 168 hours. No results for input(s): AMMONIA in the last 168 hours. CBC:  Recent Labs Lab 06/29/14 0746 06/29/14 2147 06/30/14 [id number] 07/01/14 0708 07/02/14 [id number] 07/03/14 0502 07/04/14 [id number]  WBC 7.9 6.3 6.0 7.4 7.9 14.1* 14.3*  NEUTROABS 4.6 3.4  --   --   --   --   --   HGB 12.2 12.3 11.8* 11.7* 12.1 11.9* 12.2  HCT 37.7 37.8 36.7 36.9 37.2 36.3 36.7  MCV 91.1 92.0 91.8 91.8 91.4 89.9 90.8  PLT 247 260 238 246 255 291 321   Cardiac Enzymes:  Recent Labs Lab 06/30/14 0056 06/30/14 0641 06/30/14 1223 07/01/14 0708 07/02/14 0846  TROPONINI 0.11* 0.16* 0.19* 0.30* 0.30*   BNP: BNP (last 3 results) No results for input(s): BNP in the last 8760 hours.  ProBNP (last 3 results)  Recent Labs  10/22/13 1827  PROBNP 2668.0*    CBG:  Recent Labs Lab 07/03/14 1544 07/03/14 1703 07/03/14 2105 07/04/14 0809 07/04/14 1128  GLUCAP 332* 312* 320* 250* 314*       Signed:  Aryiana Klinkner  [facility] 07/04/2014, 7:56 PM

## 2014-07-04 NOTE — Progress Notes (Signed)
Primary cardiologist: Dr. Carlyle Dolly  Seen for followup: Chest discomfort, abnormal troponin I  Subjective:    States that she slept well. No chest pain this morning. No shortness of breath or cough at rest.  Objective:   Temp:  [97.5 F (36.4 C)-98.4 F (36.9 C)] 98.4 F (36.9 C) (04/12 0637) Pulse Rate:  [73-80] 80 (04/12 0637) Resp:  [20] 20 (04/12 0637) BP: (122-135)/(53-58) 131/58 mmHg (04/12 0637) SpO2:  [92 %-95 %] 95 % (04/12 0715) Last BM Date: 07/03/14  Filed Weights   06/29/14 1900  Weight: 252 lb (114.306 kg)    Intake/Output Summary (Last 24 hours) at 07/04/14 0846 Last data filed at 07/04/14 [id number]  Gross per 24 hour  Intake    480 ml  Output   2200 ml  Net  -1720 ml    Telemetry: Sinus rhythm, lead motion artifact noted (no VT).  Exam:  General: Obese woman, appears comfortable.  Lungs: Nonlabored, decreased at bases.  Cardiac: RRR, no gallop.  Abdomen: NABS.  Extremities: No pitting.  Lab Results:  Basic Metabolic Panel:  Recent Labs Lab 07/02/14 0846 07/03/14 0502 07/04/14 0613  NA 135 133* 137  K 3.8 4.1 3.4*  CL 95* 93* 94*  CO2 30 29 33*  GLUCOSE 268* 470* 312*  BUN 18 23 31*  CREATININE 2.06* 2.19* 2.13*  CALCIUM 8.4 8.7 8.9    Liver Function Tests:  Recent Labs Lab 06/29/14 0746 06/30/14 0646  AST 22 19  ALT 29 27  ALKPHOS 78 72  BILITOT 0.7 0.5  PROT 6.4 6.3  ALBUMIN 3.0* 3.0*    CBC:  Recent Labs Lab 07/02/14 0846 07/03/14 0502 07/04/14 0613  WBC 7.9 14.1* 14.3*  HGB 12.1 11.9* 12.2  HCT 37.2 36.3 36.7  MCV 91.4 89.9 90.8  PLT 255 291 321    Cardiac Enzymes:  Recent Labs Lab 06/30/14 1223 07/01/14 0708 07/02/14 0846  TROPONINI 0.19* 0.30* 0.30*    BNP:  Recent Labs  10/22/13 1827  PROBNP 2668.0*    Coagulation:  Recent Labs Lab 07/01/14 1127  INR 1.01    Imaging: 2 day protocol Lexiscan Cardiolite - see full report (currently not ported into EPIC). There were no  diagnostic ST segment abnormalities, perfusion imaging was consistent with breast attenuation, no clear evidence of ischemia. LVEF looked to be undercalculated at 42% and did not correlate with recent echocardiogram showing LVEF 60-65%. Overall low risk study.   Medications:   Scheduled Medications: . amLODipine  5 mg Oral Daily  . aspirin  81 mg Oral Daily  . carvedilol  25 mg Oral BID WC  . citalopram  40 mg Oral Daily  . doxycycline  100 mg Oral Q12H  . enoxaparin (LOVENOX) injection  60 mg Subcutaneous Q24H  . gabapentin  800 mg Oral TID  . guaiFENesin  1,200 mg Oral BID  . insulin aspart  0-20 Units Subcutaneous TID WC  . insulin aspart  0-5 Units Subcutaneous QHS  . insulin aspart  10 Units Subcutaneous TID WC  . insulin glargine  50 Units Subcutaneous BID  . ipratropium-albuterol  3 mL Nebulization TID  . levothyroxine  50 mcg Oral QPM  . mupirocin ointment  1 application Topical BID  . pantoprazole  40 mg Oral Daily  . polyethylene glycol  17 g Oral Daily  . pravastatin  40 mg Oral QHS  . rOPINIRole  1 mg Oral QHS  . sodium chloride  3 mL Intravenous Q12H  .  torsemide  40 mg Oral BID      PRN Medications:  sodium chloride, albuterol, morphine injection, ondansetron **OR** ondansetron (ZOFRAN) IV, sodium chloride, sodium chloride   Assessment:   1. Chest pain syndrome, typical and atypical features, presently resolved. Recent "bronchitis" as well as nausea. ECG without acute ST segment changes. Minor increase in troponin I not clearly consistent with ACS, more than likely demand ischemia in the setting of other comorbidities. Lexiscan Cardiolite was low risk, no large ischemic burden, and LVEF normal by echocardiogram without wall motion abnormalities.  2. Essential hypertension, blood pressure under adequate control on current regimen.  3. Hyperlipidemia, on Pravachol.  4. 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 Has AV fistula that is maturing, not yet undergoing  hemodialysis. She is followed by Dr. Lowanda Foster..   Plan/Discussion:    Discussed test results with patient. Anticipate that she could be discharged home today on medical therapy (aspirin, Coreg, Norvasc, Demadex, Pravachol, as needed nitroglycerin). We will arrange a follow-up visit in our office over the next few weeks to reassess clinical status.   [name], M.D., F.A.C.C.

## 2014-07-04 NOTE — Progress Notes (Signed)
Patient discharged home today.  Patient was given discharge instructions, prescriptions, and care notes.  Patient verbalized understanding with no complaints or concerns voiced at this time.  Patient's heart monitor was removed and central tele was notified of patient's discharge.  IV was removed with catheter intact, no bleeding or complications.  Patient left unit, ambulating, in stable condition with a staff member.

## 2014-07-04 NOTE — Progress Notes (Signed)
Inpatient Diabetes Program Recommendations  AACE/ADA: New Consensus Statement on Inpatient Glycemic Control (2013)  Target Ranges:  Prepandial:   less than 140 mg/dL      Peak postprandial:   less than 180 mg/dL (1-2 hours)      Critically ill patients:  140 - 180 mg/dL   Results for CATON, WOHLWEND (MRN [id number]) as of 07/04/2014 08:56  Ref. Range 07/03/2014 07:26 07/03/2014 09:58 07/03/2014 11:33 07/03/2014 15:44 07/03/2014 17:03 07/03/2014 21:05 07/04/2014 08:09  Glucose-Capillary Latest Range: 70-99 mg/dL 466 (H) 499 (H) 424 (H) 332 (H) 312 (H) 320 (H) 250 (H)   Diabetes history: DM2 Outpatient Diabetes medications: Lantus 60 units with breakfast, Lantus 45 units with supper, Novolog 45 units TID with meals Current orders for Inpatient glycemic control: Lantus 50 units BID, Novolog 10 units TID with meals, Novolog 0-20 units TID with meals, Novolog 0-5 units HS  Inpatient Diabetes Program Recommendations Insulin - Basal: Please consider increasing Lantus to 55 units BID. Insulin - Meal Coverage: Once diet is resumed, please consider increasing meal coverage to Novolog 20 units TID with meals.  Thanks, Barnie Alderman, RN, MSN, CCRN, CDE Diabetes Coordinator Inpatient Diabetes Program [phone] (Team Pager from [place] to [place]) [phone] ([facility] office) [phone] [facility] office)

## 2014-07-04 NOTE — Care Management Utilization Note (Signed)
UR completed 

## 2014-07-04 NOTE — Progress Notes (Addendum)
Patient resting oxygen saturation on room air is 95%.  Patient ambulated around unit, oxygen sats dropping down to 92% on RA while walking.

## 2014-07-04 NOTE — Progress Notes (Signed)
Per cardiology, pt is ok to have diet order this am, post stress test.  Will resume heart healthy/ carb modified diet.

## 2014-07-18 ENCOUNTER — Ambulatory Visit (INDEPENDENT_AMBULATORY_CARE_PROVIDER_SITE_OTHER): Payer: Commercial Managed Care - HMO | Admitting: Adult Health

## 2014-07-18 ENCOUNTER — Encounter: Payer: Self-pay | Admitting: Adult Health

## 2014-07-18 VITALS — BP 118/58 | HR 92 | Ht 67.0 in | Wt 247.0 lb

## 2014-07-18 DIAGNOSIS — R079 Chest pain, unspecified: Secondary | ICD-10-CM | POA: Diagnosis not present

## 2014-07-18 DIAGNOSIS — I10 Essential (primary) hypertension: Secondary | ICD-10-CM | POA: Diagnosis not present

## 2014-07-18 DIAGNOSIS — I5032 Chronic diastolic (congestive) heart failure: Secondary | ICD-10-CM | POA: Diagnosis not present

## 2014-07-18 NOTE — Progress Notes (Signed)
Cardiology Office Note   Date:  07/18/2014   ID:  [name], DOB Apr 13, 1950, MRN [id number]  PCP:  Chevis Pretty, FNP  Cardiologist:  Cloria Spring, NP   Chief Complaint  Patient presents with  . Chest Pain  . Hypertension      History of Present Illness: [name] is a 63 y.o. female who presents for assessment and management of chest discomfort, with recent hospitalization in the setting of non-ST elevation MI.  Echocardiogram revealed an EF of 60-65% with pulmonary hypertension, the patient was seen on consultation at which time a stress test was ordered and found to be negative for acute ischemia.  The patient was sent home on guaifenesin and continued on current medication regimen. She is here for followup.  She is a, ANA, cardiac complaints.  She is noncompliant.  She has some questions concerning her medications.  She does not have any issues with chest pain, palpitations, and dizziness.    Past Medical History  Diagnosis Date  . RLS (restless legs syndrome)     Sleep study in 2010: No apnea; moderate periodic movements  . GERD (gastroesophageal reflux disease)   . Upper GI bleed   . Obese   . Gout   . Hypothyroidism   . Low back pain   . History of cellulitis     Neck  . Type 2 diabetes mellitus   . Hyperlipidemia   . Essential hypertension   . Anemia   . Depression   . Headache(784.0)   . Vitiligo   . H/O hiatal hernia   . Arthritis   . History of skin cancer   . COPD (chronic obstructive pulmonary disease)   . Chronic kidney disease, stage III (moderate)     Past Surgical History  Procedure Laterality Date  . Appendectomy    . Carpal tunnel release  1992, 1993, 2011    Right in 2011; previously bilateral  . Tubal ligation  1976  . Partial hysterectomy  1985    Vaginal  . Shoulder surgery  1997    Left  . Incise and drain abcess  2010    Neck-MRSA  . Colonoscopy  02/2002, 07/2009    few diverticula, otherwise negative  .  Foot surgery  1984    Bilateral heel spurs  . Abdominal hysterectomy    . Shoulder surgery      RIGHT  ADJUSTED   . Av fistula placement Left 07/16/2012    Procedure: ARTERIOVENOUS (AV) FISTULA CREATION;  Surgeon: Mal Misty, MD;  Location: [facility];  Service: Vascular;  Laterality: Left;  . Av fistula placement Left 07/29/2012    Procedure: ARTERIOVENOUS (AV) FISTULA CREATION;  Surgeon: Mal Misty, MD;  Location: [facility];  Service: Vascular;  Laterality: Left;  Left Brachial-Cephalic Arteriovenous Fistula Creation  . Colonoscopy with esophagogastroduodenoscopy (egd) N/A 08/27/2012    RMR: Normal esophagus s/p 54 F dilator. Small hiatal hernia. Gastric polyp removed, oozing (hyperplastic), which could explain heme positive stool:Colonic diverticulosis  . Revison of arteriovenous fistula Left 08/03/2013    Procedure: BANDING OF BRACHIOCEPHALIC ARTERIOVENOUS FISTULA;  Surgeon: Conrad Pickering, MD;  Location: [facility];  Service: Vascular;  Laterality: Left;  . Thrombectomy w/ embolectomy Left 08/03/2013    Procedure: THROMBECTOMY OF ARTERIOVENOUS FISTULA  AND REVISION OF  ARTERIOVENOUS FISTULA BANDING.;  Surgeon: Conrad Galax, MD;  Location: [facility];  Service: Vascular;  Laterality: Left;  . Esophagogastroduodenoscopy N/A 10/27/2013    Dr.Rourk- esophageal mucosa  appeared normal. tubular esophagus widely patent throughout its course. stomach empty. patient had longitudinal proximal gastric ersions overlying multiple proximal folds. nodular mucosa with overlying antral erosions as well. no ulcer or infiltrating process seen patent pylorus, normal first and second portion of the duodenum. bx= reactive gastropathy  . Savory dilation N/A 10/27/2013    Procedure: SAVORY DILATION;  Surgeon: Daneil Dolin, MD;  [facility];  Service: Endoscopy;  Laterality: N/A;  Venia Minks dilation N/A 10/27/2013    Procedure: Venia Minks DILATION;  Surgeon: Daneil Dolin, MD;  [facility];  Service: Endoscopy;   Laterality: N/A;  . Shuntogram Left 08/01/2013    Procedure: FISTULOGRAM;  Surgeon: Conrad Kilmichael, MD;  Location: [facility] C[facility];  Service: Cardiovascular;  Laterality: Left;     Current Outpatient Prescriptions  Medication Sig Dispense Refill  . albuterol (PROAIR HFA) 108 (90 BASE) MCG/ACT inhaler Inhale 2 puffs into the lungs every 6 (six) hours as needed for wheezing or shortness of breath. 1 Inhaler 1  . allopurinol (ZYLOPRIM) 100 MG tablet Take 1 tablet (100 mg total) by mouth every morning. 30 tablet 11  . amLODipine (NORVASC) 5 MG tablet TAKE ONE TABLET BY MOUTH ONCE DAILY IN THE EVENING 30 tablet 4  . carvedilol (COREG) 25 MG tablet Take 1 tablet (25 mg total) by mouth 2 (two) times daily with a meal. (Patient taking differently: Take 25 mg by mouth daily. ) 60 tablet 6  . citalopram (CELEXA) 40 MG tablet Take 1 tablet (40 mg total) by mouth daily. 90 tablet 1  . ferrous sulfate 325 (65 FE) MG tablet TAKE ONE TABLET BY MOUTH ONCE DAILY WITH BREAKFAST 90 tablet 1  . Fluticasone Furoate-Vilanterol (BREO ELLIPTA) 100-25 MCG/INH AEPB Inhale 1 puff into the lungs 2 (two) times daily.    Marland Kitchen gabapentin (NEURONTIN) 800 MG tablet TAKE ONE TABLET BY MOUTH THREE TIMES DAILY (Patient taking differently: TAKE THREE  TABLET BY MOUTH DAILY AT BEDTIME) 90 tablet 2  . guaiFENesin (MUCINEX) 600 MG 12 hr tablet Take 1 tablet (600 mg total) by mouth 2 (two) times daily. 20 tablet 0  . HYDROcodone-acetaminophen (NORCO) 10-325 MG per tablet Take 1 tablet by mouth every 6 (six) hours as needed. (Patient not taking: Reported on 06/29/2014) 30 tablet 0  . insulin aspart (NOVOLOG FLEXPEN) 100 UNIT/ML FlexPen Inject 45 Units into the skin 3 (three) times daily with meals. 48 mL 1  . Insulin Glargine (LANTUS SOLOSTAR) 100 UNIT/ML Solostar Pen Inject 65 units SQ prior to breakfast and 40 units prior to evening meal (Patient taking differently: Inject 45-60 Units into the skin 2 (two) times daily. Inject 60 units SQ prior to  breakfast and 45 units prior to evening meal) 45 mL 0  . levothyroxine (SYNTHROID, LEVOTHROID) 50 MCG tablet Take 1 tablet (50 mcg total) by mouth every evening. 90 tablet 1  . linagliptin (TRADJENTA) 5 MG TABS tablet Take 1 tablet (5 mg total) by mouth daily. 90 tablet 1  . mupirocin ointment (BACTROBAN) 2 % Apply 1 application topically 2 (two) times daily. 22 g 0  . nitroGLYCERIN (NITROSTAT) 0.4 MG SL tablet Place 1 tablet (0.4 mg total) under the tongue every 5 (five) minutes as needed for chest pain. 50 tablet 0  . omeprazole (PRILOSEC) 20 MG capsule Take 1 capsule (20 mg total) by mouth daily. 90 capsule 1  . ondansetron (ZOFRAN ODT) 8 MG disintegrating tablet 8mg  ODT q4 hours prn nausea (Patient taking differently: Take  8 mg by mouth every 4 (four) hours as needed. 8mg  ODT q4 hours prn nausea) 10 tablet 0  . potassium chloride (K-DUR,KLOR-CON) 10 MEQ tablet Take 1 tablet (10 mEq total) by mouth 2 (two) times daily. 90 tablet 1  . pravastatin (PRAVACHOL) 40 MG tablet Take 1 tablet (40 mg total) by mouth at bedtime. 30 tablet 4  . rOPINIRole (REQUIP) 1 MG tablet Take 1 tablet (1 mg total) by mouth at bedtime. 30 tablet 11  . sodium bicarbonate 650 MG tablet Take 1 tablet (650 mg total) by mouth 3 (three) times daily. (Patient taking differently: Take 650 mg by mouth 2 (two) times daily. ) 270 tablet 1  . tiotropium (SPIRIVA HANDIHALER) 18 MCG inhalation capsule Place 1 capsule (18 mcg total) into inhaler and inhale every morning. 90 capsule 1  . torsemide (DEMADEX) 20 MG tablet Take 40 mg by mouth 2 (two) times daily.      No current facility-administered medications for this visit.    Allergies:   Latex; Vancomycin; Vancomycin hcl; Adhesive; and Quinapril hcl    Social History:  The patient  reports that she has never smoked. She has never used smokeless tobacco. She reports that she does not drink alcohol or use illicit drugs.   Family History:  The patient's family history includes COPD  in her mother; Cancer in her paternal grandmother; Colon cancer in her father and father; Colon cancer (age of onset: 32) in her brother; Congestive Heart Failure in her maternal grandmother; Diabetes in her father; Emphysema in her mother; Hypertension in her father; Rheum arthritis in her sister.    ROS: .   All other systems are reviewed and negative.Unless otherwise mentioned in H&P above.   PHYSICAL EXAM: VS:  There were no vitals taken for this visit. , BMI There is no weight on file to calculate BMI. GEN: Well nourished, well developed, in no acute distressObese HEENT: normal Neck: no JVD, carotid bruits, or masses Cardiac: RRR; no murmurs, rubs, or gallops,no edema  Respiratory:  clear to auscultation bilaterally, normal work of breathing GI: soft, nontender, nondistended, + BS MS: no deformity or atrophy Skin: warm and dry, no rash Neuro:  Strength and sensation are intact Psych: euthymic mood, full affect  Recent Labs: 10/22/2013: Pro B Natriuretic peptide (BNP) 2668.0* 03/30/2014: TSH 0.909 06/30/2014: ALT 27 07/04/2014: BUN 31*; Creatinine 2.13*; Hemoglobin 12.2; Platelets 321; Potassium 3.4*; Sodium 137    Lipid Panel    Component Value Date/Time   CHOL 222* 03/30/2014 1527   CHOL 184 07/04/2013 1116   CHOL 214* 07/26/2012 1140   TRIG 241* 03/30/2014 1527   TRIG 504* 07/04/2013 1116   TRIG 527* 07/26/2012 1140   HDL 59 03/30/2014 1527   HDL 28* 07/04/2013 1116   HDL 37* 07/26/2012 1140   HDL 21 01/22/2009 0000   CHOLHDL 6.6* 07/04/2013 1116   CHOLHDL 7.1 05/15/2008 0513   VLDL 57* 05/15/2008 0513   LDLCALC Comment 07/04/2013 1116   LDLCALC 68 12/03/2012 1554   LDLCALC 72 07/26/2012 1140   LDLCALC 71 01/22/2009 0000      Wt Readings from Last 3 Encounters:  06/29/14 252 lb (114.306 kg)  06/29/14 252 lb (114.306 kg)  06/28/14 249 lb (112.946 kg)      Other studies Reviewed: Additional studies/ records that were reviewed today include: None Review of the  above records demonstrates: N/A   ASSESSMENT AND PLAN:  1. Chest pain: Noncardiac, normal EKG, normal stress test, normal echo concerning  LV systolic function.  She will continue her current medication without changes.  2. Chronic diastolic heart failure:she is well compensated currently.  Weight is down from 252-247.  The patient will continue her current medication regimen.  We will see her again in 6 months unless she becomes symptomatic.  3. Pulmonary hypertension:related to COPD.  Patient is on amlodipine.  She will continue her respiratory treatments, and followup with Dr. Luan Pulling, as directed   Current medicines are reviewed at length with the patient today.    Labs/ tests ordered today include:none No orders of the defined types were placed in this encounter.     Disposition:   FU with 6 months  Signed, Jory Sims, NP  07/18/2014 7:18 AM    [facility] [address] [place], [place] [address] Phone: [phone]; Fax: [phone]

## 2014-07-18 NOTE — Patient Instructions (Signed)
Your physician wants you to follow-up in: 6 month with Jory Sims, NP. You will receive a reminder letter in the mail two months in advance. If you don't receive a letter, please call our office to schedule the follow-up appointment.  Your physician recommends that you continue on your current medications as directed. Please refer to the Current Medication list given to you today.  Thank you for choosing [facility]!

## 2014-07-18 NOTE — Progress Notes (Deleted)
Name: [name]    DOB: 06-10-1950  Age: 64 y.o.  MR#: [id number]       PCP:  Chevis Pretty, FNP      Insurance: Payor: [employer] MEDICARE / Plan: [place] THN/NTSP / Product Type: *No Product type* /   CC:    Chief Complaint  Patient presents with  . Chest Pain  . Hypertension    VS Filed Vitals:   07/18/14 1428  BP: 118/58  Pulse: 92  Height: 5\' 7"  (1.702 m)  Weight: 247 lb (112.038 kg)  SpO2: 95%    Weights Current Weight  07/18/14 247 lb (112.038 kg)  06/29/14 252 lb (114.306 kg)  06/29/14 252 lb (114.306 kg)    Blood Pressure  BP Readings from Last 3 Encounters:  07/18/14 118/58  07/04/14 131/58  06/29/14 129/48     Admit date:  (Not on file) Last encounter with RMR:  Visit date not found   Allergy Latex; Vancomycin; Vancomycin hcl; Adhesive; and Quinapril hcl  Current Outpatient Prescriptions  Medication Sig Dispense Refill  . albuterol (PROAIR HFA) 108 (90 BASE) MCG/ACT inhaler Inhale 2 puffs into the lungs every 6 (six) hours as needed for wheezing or shortness of breath. 1 Inhaler 1  . allopurinol (ZYLOPRIM) 100 MG tablet Take 1 tablet (100 mg total) by mouth every morning. 30 tablet 11  . amLODipine (NORVASC) 5 MG tablet TAKE ONE TABLET BY MOUTH ONCE DAILY IN THE EVENING 30 tablet 4  . carvedilol (COREG) 25 MG tablet Take 1 tablet (25 mg total) by mouth 2 (two) times daily with a meal. (Patient taking differently: Take 25 mg by mouth daily. ) 60 tablet 6  . citalopram (CELEXA) 40 MG tablet Take 1 tablet (40 mg total) by mouth daily. 90 tablet 1  . ferrous sulfate 325 (65 FE) MG tablet TAKE ONE TABLET BY MOUTH ONCE DAILY WITH BREAKFAST 90 tablet 1  . Fluticasone Furoate-Vilanterol (BREO ELLIPTA) 100-25 MCG/INH AEPB Inhale 1 puff into the lungs 2 (two) times daily.    Marland Kitchen gabapentin (NEURONTIN) 800 MG tablet TAKE ONE TABLET BY MOUTH THREE TIMES DAILY (Patient taking differently: TAKE THREE  TABLET BY MOUTH DAILY AT BEDTIME) 90 tablet 2  .  HYDROcodone-acetaminophen (NORCO) 10-325 MG per tablet Take 1 tablet by mouth every 6 (six) hours as needed. 30 tablet 0  . insulin aspart (NOVOLOG FLEXPEN) 100 UNIT/ML FlexPen Inject 45 Units into the skin 3 (three) times daily with meals. 48 mL 1  . Insulin Glargine (LANTUS SOLOSTAR) 100 UNIT/ML Solostar Pen Inject 65 units SQ prior to breakfast and 40 units prior to evening meal (Patient taking differently: Inject 45-60 Units into the skin 2 (two) times daily. Inject 60 units SQ prior to breakfast and 45 units prior to evening meal) 45 mL 0  . levothyroxine (SYNTHROID, LEVOTHROID) 50 MCG tablet Take 1 tablet (50 mcg total) by mouth every evening. 90 tablet 1  . linagliptin (TRADJENTA) 5 MG TABS tablet Take 1 tablet (5 mg total) by mouth daily. 90 tablet 1  . nitroGLYCERIN (NITROSTAT) 0.4 MG SL tablet Place 1 tablet (0.4 mg total) under the tongue every 5 (five) minutes as needed for chest pain. 50 tablet 0  . omeprazole (PRILOSEC) 20 MG capsule Take 1 capsule (20 mg total) by mouth daily. 90 capsule 1  . ondansetron (ZOFRAN ODT) 8 MG disintegrating tablet 8mg  ODT q4 hours prn nausea (Patient taking differently: Take 8 mg by mouth every 4 (four) hours as needed.  8mg  ODT q4 hours prn nausea) 10 tablet 0  . potassium chloride (K-DUR,KLOR-CON) 10 MEQ tablet Take 1 tablet (10 mEq total) by mouth 2 (two) times daily. 90 tablet 1  . pravastatin (PRAVACHOL) 40 MG tablet Take 1 tablet (40 mg total) by mouth at bedtime. 30 tablet 4  . rOPINIRole (REQUIP) 1 MG tablet Take 1 tablet (1 mg total) by mouth at bedtime. 30 tablet 11  . sodium bicarbonate 650 MG tablet Take 1 tablet (650 mg total) by mouth 3 (three) times daily. (Patient taking differently: Take 650 mg by mouth 2 (two) times daily. ) 270 tablet 1  . tiotropium (SPIRIVA HANDIHALER) 18 MCG inhalation capsule Place 1 capsule (18 mcg total) into inhaler and inhale every morning. 90 capsule 1  . torsemide (DEMADEX) 20 MG tablet Take 40 mg by mouth 2 (two)  times daily.      No current facility-administered medications for this visit.    Discontinued Meds:    Medications Discontinued During This Encounter  Medication Reason  . guaiFENesin (MUCINEX) 600 MG 12 hr tablet Error  . mupirocin ointment (BACTROBAN) 2 % Error    Patient Active Problem List   Diagnosis Date Noted  . Acute bronchitis 07/01/2014  . Non-ST elevation MI (NSTEMI) 07/01/2014  . Mucopurulent chronic bronchitis 03/30/2014  . Hypokalemia 03/30/2014  . Dyslipidemia associated with type 2 diabetes mellitus 12/01/2013  . Hypoglycemia 10/26/2013  . Hyperglycemia 10/22/2013  . Cholecystitis 10/22/2013  . Sensation of cold in finger-Left 09/12/2013  . Pain of left hand 09/12/2013  . Numbness of left hand 07/29/2013  . Cramping of hands 07/29/2013  . 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 End stage renal disease 12/13/2012  . Encounter for routine gynecological examination 09/30/2012  . 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 Heme positive stool 08/18/2012  . Psoriasis 07/26/2012  . DM type 2, uncontrolled, with neuropathy 04/29/2012  . Chest pain   . RLS (restless legs syndrome)   . Chronic kidney disease, stage IV (severe)   . Gout 11/22/2009  . Anemia, normocytic normochromic 07/24/2009  . Hypothyroidism 01/22/2009  . Hyperlipidemia 01/22/2009  . Obesity 01/22/2009  . Hypertension 01/22/2009  . Gastroesophageal reflux disease 01/22/2009    LABS    Component Value Date/Time   NA 137 07/04/2014 0613   NA 133* 07/03/2014 0502   NA 135 07/02/2014 0846   NA 139 03/30/2014 1527   NA 132* 09/30/2013 1515   NA 138 04/01/2013 1621   K 3.4* 07/04/2014 0613   K 4.1 07/03/2014 0502   K 3.8 07/02/2014 0846   CL 94* 07/04/2014 0613   CL 93* 07/03/2014 0502   CL 95* 07/02/2014 0846   CO2 33* 07/04/2014 0613   CO2 29 07/03/2014 0502   CO2 30 07/02/2014 0846   GLUCOSE 312* 07/04/2014 0613   GLUCOSE 470* 07/03/2014 0502   GLUCOSE  268* 07/02/2014 0846   GLUCOSE 218* 03/30/2014 1527   GLUCOSE 451* 09/30/2013 1515   GLUCOSE 66 04/01/2013 1621   BUN 31* 07/04/2014 0613   BUN 23 07/03/2014 0502   BUN 18 07/02/2014 0846   BUN 50* 03/30/2014 1527   BUN 25 09/30/2013 1515   BUN 29* 04/01/2013 1621   CREATININE 2.13* 07/04/2014 0613   CREATININE 2.19* 07/03/2014 0502   CREATININE 2.06* 07/02/2014 0846   CREATININE 1.84* 09/30/2012 1238   CREATININE 1.94* 09/03/2012 0850   CREATININE 2.54* 07/26/2012 1140   CREATININE 1.81* 05/15/2008 1046  CALCIUM 8.9 07/04/2014 0613   CALCIUM 8.7 07/03/2014 0502   CALCIUM 8.4 07/02/2014 0846   GFRNONAA 23* 07/04/2014 0613   GFRNONAA 22* 07/03/2014 0502   GFRNONAA 24* 07/02/2014 0846   GFRNONAA 29* 09/30/2012 1238   GFRNONAA 19* 07/26/2012 1140   GFRAA 27* 07/04/2014 0613   GFRAA 26* 07/03/2014 0502   GFRAA 28* 07/02/2014 0846   GFRAA 33* 09/30/2012 1238   GFRAA 22* 07/26/2012 1140   CMP     Component Value Date/Time   NA 137 07/04/2014 0613   NA 139 03/30/2014 1527   K 3.4* 07/04/2014 0613   CL 94* 07/04/2014 0613   CO2 33* 07/04/2014 0613   GLUCOSE 312* 07/04/2014 0613   GLUCOSE 218* 03/30/2014 1527   BUN 31* 07/04/2014 0613   BUN 50* 03/30/2014 1527   CREATININE 2.13* 07/04/2014 0613   CREATININE 1.84* 09/30/2012 1238   CREATININE 1.81* 05/15/2008 1046   CALCIUM 8.9 07/04/2014 0613   PROT 6.3 06/30/2014 0646   PROT 6.6 03/30/2014 1527   ALBUMIN 3.0* 06/30/2014 0646   AST 19 06/30/2014 0646   ALT 27 06/30/2014 0646   ALKPHOS 72 06/30/2014 0646   BILITOT 0.5 06/30/2014 0646   GFRNONAA 23* 07/04/2014 0613   GFRNONAA 29* 09/30/2012 1238   GFRAA 27* 07/04/2014 0613   GFRAA 33* 09/30/2012 1238       Component Value Date/Time   WBC 14.3* 07/04/2014 0613   WBC 14.1* 07/03/2014 0502   WBC 7.9 07/02/2014 0846   WBC 9.9 09/30/2013 1515   WBC 11.3* 03/15/2013 1410   WBC 9.6 09/30/2012 1248   HGB 12.2 07/04/2014 0613   HGB 11.9* 07/03/2014 0502   HGB 12.1  07/02/2014 0846   HGB 13.2 03/15/2013 1410   HGB 11.8* 09/30/2012 1248   HCT 36.7 07/04/2014 0613   HCT 36.3 07/03/2014 0502   HCT 37.2 07/02/2014 0846   HCT 42.3 03/15/2013 1410   HCT 34.2* 09/30/2012 1248   MCV 90.8 07/04/2014 0613   MCV 89.9 07/03/2014 0502   MCV 91.4 07/02/2014 0846   MCV 84.6 03/15/2013 1410   MCV 83.8 09/30/2012 1248    Lipid Panel     Component Value Date/Time   CHOL 222* 03/30/2014 1527   CHOL 184 07/04/2013 1116   CHOL 214* 07/26/2012 1140   TRIG 241* 03/30/2014 1527   TRIG 504* 07/04/2013 1116   TRIG 527* 07/26/2012 1140   HDL 59 03/30/2014 1527   HDL 28* 07/04/2013 1116   HDL 37* 07/26/2012 1140   HDL 21 01/22/2009 0000   CHOLHDL 6.6* 07/04/2013 1116   CHOLHDL 7.1 05/15/2008 0513   VLDL 57* 05/15/2008 0513   LDLCALC Comment 07/04/2013 1116   LDLCALC 68 12/03/2012 1554   LDLCALC 72 07/26/2012 1140   LDLCALC 71 01/22/2009 0000    ABG    Component Value Date/Time   PHART 7.379 01/24/2013 1016   PCO2ART 37.0 01/24/2013 1016   PO2ART 76.2* 01/24/2013 1016   HCO3 21.3 01/24/2013 1016   TCO2 24 08/01/2013 1122   ACIDBASEDEF 3.0* 01/24/2013 1016   O2SAT 94.7 01/24/2013 1016     Lab Results  Component Value Date   TSH 0.909 03/30/2014   BNP (last 3 results) No results for input(s): BNP in the last 8760 hours.  ProBNP (last 3 results)  Recent Labs  10/22/13 1827  PROBNP 2668.0*    Cardiac Panel (last 3 results) No results for input(s): CKTOTAL, CKMB, TROPONINI, RELINDX in the last 72 hours.  Iron/TIBC/Ferritin/ %  Sat    Component Value Date/Time   IRON 55 12/16/2012 1444   TIBC 306 08/03/2009 1955   FERRITIN 63 12/16/2012 1444   IRONPCTSAT 25 08/03/2009 1955     EKG Orders placed or performed during the hospital encounter of 06/29/14  . EKG 12-Lead  . EKG 12-Lead  . EKG 12-Lead  . EKG 12-Lead  . EKG  . EKG 12-Lead  . EKG 12-Lead  . EKG 12-Lead  . EKG 12-Lead     Prior Assessment and Plan Problem List as of  07/18/2014      Cardiovascular and Mediastinum   Hypertension   Last Assessment & Plan 09/06/2012 Office Visit Edited 09/08/2012  2:34 PM by Yehuda Savannah, MD    Blood pressure control now suboptimal with discontinuation of furosemide temporarily and a decrease in her dose of amlodipine. The latter has resulted in improved peripheral edema. Lisinopril will be added to her regime and her dose of carvedilol increased. She will continue to monitor her own blood pressures and report elevated values.      A-V fistula   Non-ST elevation MI (NSTEMI)     Respiratory   Mucopurulent chronic bronchitis   Acute bronchitis     Digestive   Gastroesophageal reflux disease   Cholecystitis     Endocrine   Hypothyroidism   DM type 2, uncontrolled, with neuropathy   Last Assessment & Plan 09/06/2012 Office Visit Written 09/06/2012  2:06 PM by Yehuda Savannah, MD    Control of diabetes is suboptimal. Patient referred back to PCP for further attention to this issue.      Hypoglycemia   Dyslipidemia associated with type 2 diabetes mellitus     Musculoskeletal and Integument   Psoriasis     Genitourinary   Chronic kidney disease, stage IV (severe)   Last Assessment & Plan 09/06/2012 Office Visit Written 09/06/2012  2:06 PM by Yehuda Savannah, MD    Renal function improved since previous visit, but this may be the result of temporary discontinuation of diuretic. Furosemide will be resumed and low dose ACE inhibitor added for treatment of presumed diabetic nephropathy. Continued monitoring of electrolytes and renal function.      End stage renal disease     Other   Hyperlipidemia   Last Assessment & Plan 08/25/2012 Office Visit Written 08/25/2012  7:04 PM by Yehuda Savannah, MD    Recent lipid profile demonstrates adequate levels of total and LDL cholesterol. Triglycerides are moderately elevated, but do not require specific add-on therapy.      Gout   Obesity   Anemia, normocytic normochromic    Last Assessment & Plan 09/06/2012 Office Visit Edited 09/06/2012  2:07 PM by Yehuda Savannah, MD    Mild anemia with low serum iron and borderline low ferritin. Empiric treatment with iron can be considered. Patient apparently had chronic blood loss related to a gastric polyp. Continued monitoring to exclude occult GI blood loss would be appropriate.       Chest pain   Last Assessment & Plan 08/03/2012 Office Visit Written 08/03/2012  1:48 PM by Lendon Colonel, NP    She denies chest pain or dyspnea. Continue current medication regimen.      RLS (restless legs syndrome)   Heme positive stool   Last Assessment & Plan 10/06/2012 Office Visit Written 10/06/2012 10:55 AM by Orvil Feil, NP    64 year old female found to have heme positive stool, very mild anemia likely multifactorial (chronic  disease +/- early IDA?) and findings of oozing gastric hyperplastic polyp on recent EGD. Colonoscopy benign, with need for repeat screening in 2019 due to family history of colon cancer. Gastric polyp could very well be the culprit of heme positive stool; she has no overt signs of GI bleeding. Will hold off on capsule study at this time, as her anemia is likely multifactorial. Recommend repeat CBC, iron, and ferritin in 3 months. For now, will add iron daily. Physically return in 1 year.   As of note, dysphagia resolved after empiric dilation at time of EGD.        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  Emphysema.  EXAM: CHEST  2 VIEW  COMPARISON:  PA and lateral chest 10/22/2013.  FINDINGS: There is chronic, mild peribronchial thickening. No consolidative process, pneumothorax or effusion is  identified. Heart size is mildly enlarged. Chronic, mild elevation the right hemidiaphragm relative to the left is unchanged.  IMPRESSION: No acute finding.  Chronic bronchitic change.   Electronically Signed   By: Inge Rise M.D.   On: 06/29/2014 08:02   Nm Myocar Multi W/spect W/wall Motion / Ef  07/04/2014   CLINICAL DATA:  64 year old woman with hypertension, diabetes mellitus, CKD stage 3-4, and chronic dyspnea with restrictive PFTs presents with chest pain and minor increase in troponin I. This study is requested for the assessment of ischemic burden.  EXAM: MYOCARDIAL IMAGING WITH SPECT (REST AND PHARMACOLOGIC-STRESS - 2 DAY PROTOCOL)  GATED LEFT VENTRICULAR WALL MOTION STUDY  LEFT VENTRICULAR EJECTION FRACTION  TECHNIQUE: Standard myocardial SPECT imaging was performed after resting intravenous injection of 10 mCi Tc-42m sestamibi. Subsequently, on a second day, intravenous infusion of Lexiscan was performed under the supervision of the Cardiology staff. At peak effect of the drug, 30 mCi Tc-21m sestamibi was injected intravenously and standard myocardial SPECT imaging was performed. Quantitative gated imaging was also performed to evaluate left ventricular wall motion, and estimate left ventricular ejection fraction.  FINDINGS: Baseline tracing shows normal sinus rhythm with poor R wave progression. Lexiscan bolus was given in standard fashion. No chest pain was reported. Heart rate increased from 71 to 88 BPM, and blood pressure remained stable at 121/57. There were no diagnostic ST segment changes, and no arrhythmias were noted.  Analysis of the raw perfusion data finds adequate myocardial radiotracer uptake with breast attenuation.  Perfusion: No significant, reversible perfusion defects noted in indicate ischemia.  Wall Motion: Normal left ventricular wall motion. No left ventricular dilation.  Left Ventricular Ejection Fraction: 42 %  End diastolic volume 123XX123 ml  End systolic volume 68 ml   IMPRESSION: 1. No reversible ischemia or infarction.  2. Normal left ventricular wall motion.  3. Left ventricular ejection fraction 42% - likely undercalculated, does not correlate with recent echocardiogram (LVEF 60-65%).  4. Low-risk stress test findings*.  *2012 Appropriate Use Criteria for Coronary Revascularization Focused Update: J Am Coll Cardiol. [id number]. http://content.airportbarriers.com.aspx?articleid=1201161   Electronically Signed   By: Rozann Lesches M.D.   On: 07/04/2014 08:39   Nm Pulmonary Perf And Vent  06/30/2014   CLINICAL DATA:  Shortness  of breath and chest pain. History of emphysema.  EXAM: NUCLEAR MEDICINE VENTILATION - PERFUSION LUNG SCAN  TECHNIQUE: Ventilation images were obtained in multiple projections using inhaled aerosol technetium 99 M DTPA. Perfusion images were obtained in multiple projections after intravenous injection of Tc-14m MAA.  RADIOPHARMACEUTICALS:  40 mCi Tc-63m DTPA aerosol and 6 mCi Tc-63m MAA  COMPARISON:  Chest radiographs 06/29/2014  FINDINGS: Ventilation: Mild heterogeneous distribution tracer activity throughout the lungs compatible with history of emphysema. Pooling of tracer in the central airways.  Perfusion: Normal homogeneous distribution of tracer activity throughout both lungs. No focal defects identified.  IMPRESSION: Low probability of pulmonary embolus.   Electronically Signed   By: Lucienne Capers M.D.   On: 06/30/2014 02:36   US Venous Img Upper Uni Left  06/29/2014   CLINICAL DATA:  Left upper extremity swelling for 2 weeks.  EXAM: Left UPPER EXTREMITY VENOUS DOPPLER ULTRASOUND  TECHNIQUE: Gray-scale sonography with graded compression, as well as color Doppler and duplex ultrasound were performed to evaluate the upper extremity deep venous system from the level of the subclavian vein and including the jugular, axillary, basilic, radial, ulnar and upper cephalic vein. Spectral Doppler was utilized to evaluate flow at rest and with  distal augmentation maneuvers.  COMPARISON:  None.  FINDINGS: Contralateral Subclavian Vein: Respiratory phasicity is normal and symmetric with the symptomatic side. No evidence of thrombus. Normal compressibility.  Internal Jugular Vein: No evidence of thrombus. Normal compressibility, respiratory phasicity and response to augmentation.  Subclavian Vein: No evidence of thrombus. Normal compressibility, respiratory phasicity and response to augmentation.  Axillary Vein: No evidence of thrombus. Normal compressibility, respiratory phasicity and response to augmentation.  Cephalic Vein: Partial compressibility of this vein in the region of the antecubital fossa is noted with flow consistent with nonocclusive thrombus.  Basilic Vein: No evidence of thrombus. Normal compressibility, respiratory phasicity and response to augmentation.  Brachial Veins: No evidence of thrombus. Normal compressibility, respiratory phasicity and response to augmentation.  Radial Veins: No evidence of thrombus. Normal compressibility, respiratory phasicity and response to augmentation.  Ulnar Veins: No evidence of thrombus. Normal compressibility, respiratory phasicity and response to augmentation.  Venous Reflux:  None visualized.  Other Findings: Patient reportedly has fistula in the region of the left antecubital fossa.  IMPRESSION: Nonocclusive thrombus is noted in the left cephalic vein in the antecubital fossa. No other thrombus is noted in the left upper extremity.   Electronically Signed   By: Marijo Conception, M.D.   On: 06/29/2014 13:00

## 2014-07-25 ENCOUNTER — Ambulatory Visit (INDEPENDENT_AMBULATORY_CARE_PROVIDER_SITE_OTHER): Payer: Commercial Managed Care - HMO | Admitting: Nurse Practitioner

## 2014-07-25 ENCOUNTER — Encounter: Payer: Self-pay | Admitting: Nurse Practitioner

## 2014-07-25 VITALS — BP 131/69 | HR 88 | Temp 98.2°F | Ht 67.0 in | Wt 243.0 lb

## 2014-07-25 DIAGNOSIS — B373 Candidiasis of vulva and vagina: Secondary | ICD-10-CM | POA: Diagnosis not present

## 2014-07-25 DIAGNOSIS — I10 Essential (primary) hypertension: Secondary | ICD-10-CM | POA: Diagnosis not present

## 2014-07-25 DIAGNOSIS — N898 Other specified noninflammatory disorders of vagina: Secondary | ICD-10-CM

## 2014-07-25 DIAGNOSIS — L298 Other pruritus: Secondary | ICD-10-CM

## 2014-07-25 DIAGNOSIS — B3731 Acute candidiasis of vulva and vagina: Secondary | ICD-10-CM

## 2014-07-25 LAB — POCT URINALYSIS DIPSTICK
BILIRUBIN UA: NEGATIVE
Blood, UA: NEGATIVE
Glucose, UA: NEGATIVE
Ketones, UA: NEGATIVE
Nitrite, UA: NEGATIVE
SPEC GRAV UA: 1.01
Urobilinogen, UA: NEGATIVE
pH, UA: 5

## 2014-07-25 LAB — POCT UA - MICROSCOPIC ONLY
BACTERIA, U MICROSCOPIC: NEGATIVE
Casts, Ur, LPF, POC: NEGATIVE
Crystals, Ur, HPF, POC: NEGATIVE
RBC, urine, microscopic: NEGATIVE

## 2014-07-25 MED ORDER — HYDROCODONE-ACETAMINOPHEN 10-325 MG PO TABS: 1.0000 | ORAL_TABLET | Freq: Four times a day (QID) | ORAL | Status: DC | PRN

## 2014-07-25 MED ORDER — GABAPENTIN 800 MG PO TABS: 800.0000 mg | ORAL_TABLET | Freq: Three times a day (TID) | ORAL | Status: DC

## 2014-07-25 MED ORDER — FLUCONAZOLE 150 MG PO TABS: 150.0000 mg | ORAL_TABLET | Freq: Once | ORAL | Status: DC

## 2014-07-25 MED ORDER — CARVEDILOL 25 MG PO TABS: 25.0000 mg | ORAL_TABLET | Freq: Two times a day (BID) | ORAL | Status: DC

## 2014-07-25 NOTE — Progress Notes (Signed)
   Subjective:    Patient ID: [name], female    DOB: [birth year], 65 y.o.   MRN: [id number]  HPI Patient in c/o vaginal discharge that started 2 weeks ago- slight itching - no odor that she can tell. Was in hospital on antibiotic for bronchitis.    Review of Systems  Constitutional: Negative.   HENT: Negative.   Respiratory: Negative.   Cardiovascular: Negative.   Genitourinary: Negative.   Neurological: Negative.   Psychiatric/Behavioral: Negative.   All other systems reviewed and are negative.      Objective:   Physical Exam  Constitutional: She appears well-developed and well-nourished.  Cardiovascular: Normal rate, regular rhythm and normal heart sounds.   Pulmonary/Chest: Effort normal and breath sounds normal.  Genitourinary:  Perineum erythematous mist and raw appearing.   BP 131/69 mmHg  Pulse 88  Temp(Src) 98.2 F (36.8 C) (Oral)  Ht 5\' 7"  (1.702 m)  Wt 243 lb (110.224 kg)  BMI 38.05 kg/m2  Results for orders placed or performed in visit on 07/25/14  POCT urinalysis dipstick  Result Value Ref Range   Color, UA yellow    Clarity, UA cloudy    Glucose, UA neg    Bilirubin, UA neg    Ketones, UA neg    Spec Grav, UA 1.010    Blood, UA neg    pH, UA 5.0    Protein, UA 3+++    Urobilinogen, UA negative    Nitrite, UA neg    Leukocytes, UA Trace   POCT UA - Microscopic Only  Result Value Ref Range   WBC, Ur, HPF, POC 10-15    RBC, urine, microscopic neg    Bacteria, U Microscopic neg    Mucus, UA mod    Epithelial cells, urine per micros occ    Crystals, Ur, HPF, POC neg    Casts, Ur, LPF, POC neg    Yeast, UA mod           Assessment & Plan:   1. Vaginal itching   2. Vaginal candidiasis    Meds ordered this encounter  Medications  . fluconazole (DIFLUCAN) 150 MG tablet    Sig: Take 1 tablet (150 mg total) by mouth once.    Dispense:  1 tablet    Refill:  0    Order Specific Question:  Supervising Provider    Answer:  Chipper Herb [[id number]]   Avoid bubble baths No scratching Can use monostat OTC for itching RTO prn  Mary-Margaret Hassell Done, FNP

## 2014-07-25 NOTE — Patient Instructions (Signed)

## 2014-08-08 ENCOUNTER — Telehealth: Payer: Self-pay | Admitting: Nurse Practitioner

## 2014-08-08 ENCOUNTER — Other Ambulatory Visit: Payer: Self-pay | Admitting: *Deleted

## 2014-08-08 DIAGNOSIS — G2581 Restless legs syndrome: Secondary | ICD-10-CM

## 2014-08-08 MED ORDER — ROPINIROLE HCL 1 MG PO TABS: 1.0000 mg | ORAL_TABLET | Freq: Every day | ORAL | Status: DC

## 2014-08-08 NOTE — Telephone Encounter (Signed)
Refill #30 no refills called to [facility] [place]

## 2014-08-08 NOTE — Telephone Encounter (Signed)
Please call in rotinirole 1mg  qhs  #30 with 0 refills Patient in [place] and needs called into place listed in message

## 2014-08-19 ENCOUNTER — Other Ambulatory Visit: Payer: Self-pay | Admitting: Pharmacist

## 2014-08-23 ENCOUNTER — Telehealth: Payer: Self-pay | Admitting: Family Medicine

## 2014-08-23 NOTE — Telephone Encounter (Signed)
Last filled 07/24/14, last seen 06/28/14. Rx will print

## 2014-08-24 MED ORDER — HYDROCODONE-ACETAMINOPHEN 10-325 MG PO TABS: 1.0000 | ORAL_TABLET | Freq: Four times a day (QID) | ORAL | Status: DC | PRN

## 2014-08-24 NOTE — Telephone Encounter (Signed)
Rx refill per patient request 

## 2014-08-30 ENCOUNTER — Other Ambulatory Visit: Payer: Self-pay | Admitting: *Deleted

## 2014-08-30 DIAGNOSIS — K219 Gastro-esophageal reflux disease without esophagitis: Secondary | ICD-10-CM

## 2014-08-30 MED ORDER — OMEPRAZOLE 20 MG PO CPDR: 20.0000 mg | DELAYED_RELEASE_CAPSULE | Freq: Every day | ORAL | Status: DC

## 2014-09-12 ENCOUNTER — Telehealth: Payer: Self-pay | Admitting: Family Medicine

## 2014-09-12 ENCOUNTER — Other Ambulatory Visit: Payer: Self-pay

## 2014-09-12 DIAGNOSIS — G2581 Restless legs syndrome: Secondary | ICD-10-CM

## 2014-09-12 MED ORDER — ROPINIROLE HCL 1 MG PO TABS: 1.0000 mg | ORAL_TABLET | Freq: Every day | ORAL | Status: DC

## 2014-09-27 ENCOUNTER — Telehealth: Payer: Self-pay | Admitting: Nurse Practitioner

## 2014-09-27 MED ORDER — HYDROCODONE-ACETAMINOPHEN 10-325 MG PO TABS: 1.0000 | ORAL_TABLET | Freq: Four times a day (QID) | ORAL | Status: DC | PRN

## 2014-10-11 ENCOUNTER — Other Ambulatory Visit: Payer: Self-pay | Admitting: Nurse Practitioner

## 2014-10-24 ENCOUNTER — Other Ambulatory Visit: Payer: Self-pay | Admitting: *Deleted

## 2014-10-24 DIAGNOSIS — E876 Hypokalemia: Secondary | ICD-10-CM

## 2014-10-24 MED ORDER — INSULIN ASPART 100 UNIT/ML FLEXPEN: 45.0000 [IU] | PEN_INJECTOR | Freq: Three times a day (TID) | SUBCUTANEOUS | Status: DC

## 2014-10-24 MED ORDER — POTASSIUM CHLORIDE CRYS ER 10 MEQ PO TBCR: 10.0000 meq | EXTENDED_RELEASE_TABLET | Freq: Two times a day (BID) | ORAL | Status: DC

## 2014-10-26 ENCOUNTER — Encounter: Payer: Commercial Managed Care - HMO | Admitting: Family Medicine

## 2014-10-30 ENCOUNTER — Encounter: Payer: Self-pay | Admitting: Nurse Practitioner

## 2014-11-10 ENCOUNTER — Other Ambulatory Visit: Payer: Self-pay | Admitting: Nurse Practitioner

## 2014-12-14 ENCOUNTER — Encounter: Payer: Self-pay | Admitting: *Deleted

## 2014-12-25 ENCOUNTER — Other Ambulatory Visit: Payer: Self-pay | Admitting: *Deleted

## 2014-12-25 DIAGNOSIS — E034 Atrophy of thyroid (acquired): Secondary | ICD-10-CM

## 2014-12-25 DIAGNOSIS — G2581 Restless legs syndrome: Secondary | ICD-10-CM

## 2014-12-25 MED ORDER — PRAVASTATIN SODIUM 40 MG PO TABS: 40.0000 mg | ORAL_TABLET | Freq: Every day | ORAL | Status: DC

## 2014-12-25 MED ORDER — LEVOTHYROXINE SODIUM 50 MCG PO TABS: 50.0000 ug | ORAL_TABLET | Freq: Every evening | ORAL | Status: DC

## 2014-12-25 MED ORDER — ROPINIROLE HCL 1 MG PO TABS: 1.0000 mg | ORAL_TABLET | Freq: Every day | ORAL | Status: DC

## 2014-12-25 NOTE — Telephone Encounter (Signed)
Meds refilled as requested but only for 30 days with no refills. Patient needs to be seen by a provider or she needs to establish care with someone else if she has moved out of state.

## 2015-02-14 IMAGING — NM NM HEPATO W/GB/PHARM/[PERSON_NAME]
3 series · 13 of 13 positions shown · non-contrast
Comparison: None

CLINICAL DATA: Abdominal pain, evaluate for cholecystitis

EXAM:
NUCLEAR MEDICINE HEPATOBILIARY IMAGING WITH GALLBLADDER EF
TECHNIQUE: Sequential images of the abdomen were obtained [DATE] minutes
following intravenous administration of radiopharmaceutical. After
slow intravenous infusion of 2.2 micrograms Cholecystokinin,
gallbladder ejection fraction was determined.
RADIOPHARMACEUTICALS:  5 [employer] Pc-DDm Choletec

[he hepatobiliary · 1 of 1 slices shown (1 of 3)]
[im 1/1]
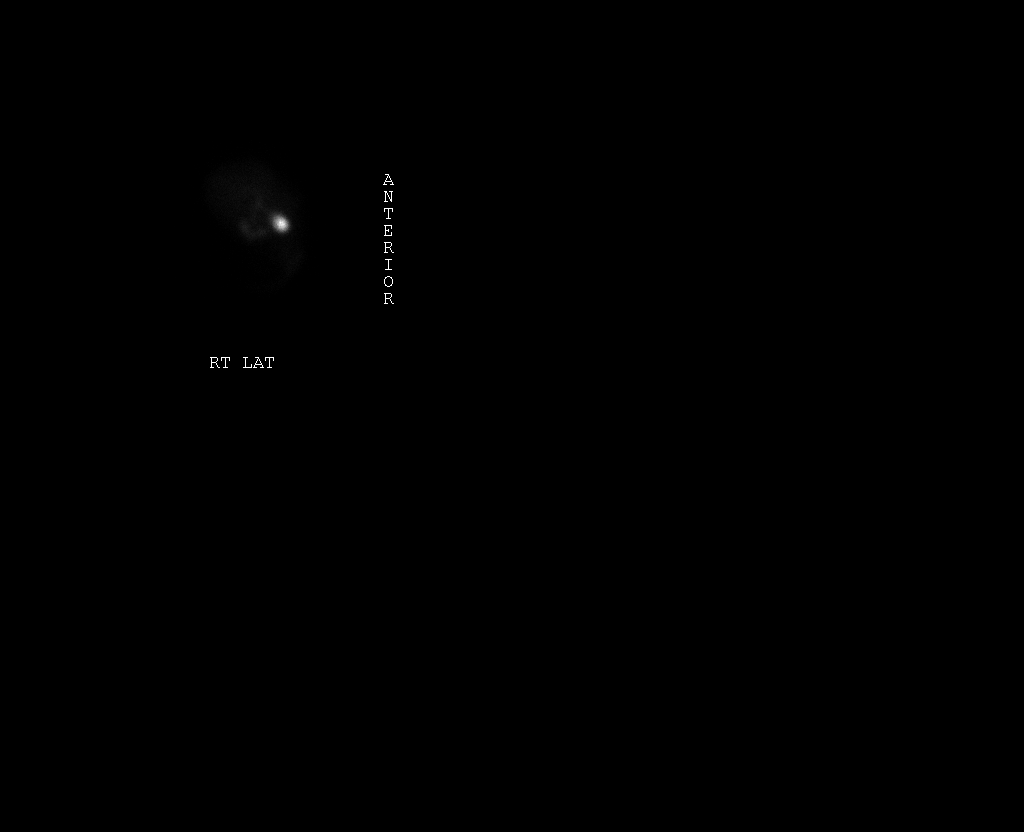

[he hepatobiliary · 3.43mm/px · 6 of 30 frames shown (2 of 3)]
[frame 3/30]
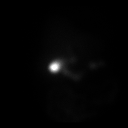
[frame 8/30]
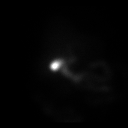
[frame 13/30]
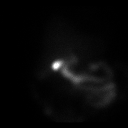
[frame 18/30]
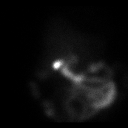
[frame 23/30]
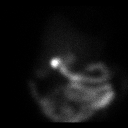
[frame 28/30]
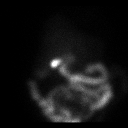

[he hepatobiliary · 3.43mm/px · 6 of 43 frames shown (3 of 3)]
[frame 4/43]
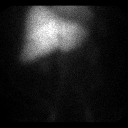
[frame 11/43]
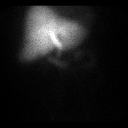
[frame 18/43]
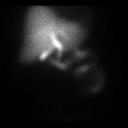
[frame 25/43]
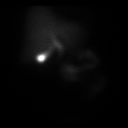
[frame 32/43]
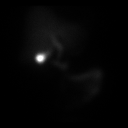
[frame 40/43]
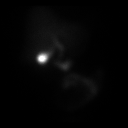

[13 of 13 positions shown; findings below may reference images not displayed]

FINDINGS: There is immediate homogeneous uptake of radiotracer in the liver.
Filling of the gallbladder begins at 20 minutes. Radiotracer uptake
is present in the small bowel at 15 minutes.

At the end of 1-hour, there is relatively good clearance of activity
from the liver.

When gallbladder filling was complete, the patient was given an IV
infusion of CCK. At 30 min the gallbladder ejection fraction
measures 83.5%. At 30 min, normal ejection fraction is greater than
30%.

The patient did not experience symptoms during CCK infusion.
IMPRESSION: 1. Normal hepatobiliary scan.
2. Normal gallbladder ejection fraction.

## 2015-02-27 ENCOUNTER — Other Ambulatory Visit: Payer: Self-pay

## 2015-02-27 NOTE — Patient Outreach (Signed)
[facility]) Care Management  02/27/2015  [name] [birth year] [id number]   Telephone Screen  Referral Date: 02/26/15 Referral Source: [facility] tier 4 list Referral Reason:DM,COPD, CHF, 1 ED visit, 2 admits   Outreach attempt # 1 to patient. No phone number listed for patient. Contacted number found in [email or web address] [phone] answered and reported wrong number and hung up. Contacted emergency contact number-no answer at present. RN CM left HIPAA compliant voicemail message along with contact info.   Plan: RN CM will attempt outreach call to patient within a week.  Enzo Montgomery, RN,BSN,CCM [place] Management Telephonic Care Management Coordinator Direct Phone: [phone] Toll Free: [phone] Fax: [phone]

## 2015-03-02 ENCOUNTER — Other Ambulatory Visit: Payer: Self-pay

## 2015-03-02 NOTE — Patient Outreach (Signed)
[facility]) Care Management  03/02/2015  [name] 1950/08/15 [id number]   Telephone Screen  Referral Date: 02/26/15 Referral Source: [facility] tier 4 list Referral Reason:DM,COPD, CHF, 1 ED visit, 2 admits   Outreach attempt # 2 to patient. Patient reached at [phone]. Discussed referral and [facility] services. Patient reported that she no longer resides in [place]. She states that she has moved to [place]. She has a new PCP. She reports that she notified [employer] of her move. Patient not eligible for [facility] services as she resides out of the service area.   Plan: RN CM will notify [facility] administrative assistance of case closure. RN CM will send MD case closure letter.  Enzo Montgomery, RN,BSN,CCM [facility] [facility] Management Telephonic Care Management Coordinator Direct Phone: [phone] Toll Free: [phone] Fax: [phone]

## 2015-04-11 ENCOUNTER — Telehealth: Payer: Self-pay | Admitting: Nurse Practitioner

## 2016-03-06 ENCOUNTER — Ambulatory Visit (INDEPENDENT_AMBULATORY_CARE_PROVIDER_SITE_OTHER): Payer: Medicare Other | Admitting: Pediatrics

## 2016-03-06 ENCOUNTER — Encounter: Payer: Self-pay | Admitting: Pediatrics

## 2016-03-06 VITALS — BP 170/73 | HR 64 | Temp 97.7°F | Ht 67.0 in | Wt 186.8 lb

## 2016-03-06 DIAGNOSIS — E1165 Type 2 diabetes mellitus with hyperglycemia: Secondary | ICD-10-CM

## 2016-03-06 DIAGNOSIS — E1140 Type 2 diabetes mellitus with diabetic neuropathy, unspecified: Secondary | ICD-10-CM

## 2016-03-06 DIAGNOSIS — N184 Chronic kidney disease, stage 4 (severe): Secondary | ICD-10-CM

## 2016-03-06 DIAGNOSIS — E039 Hypothyroidism, unspecified: Secondary | ICD-10-CM

## 2016-03-06 DIAGNOSIS — M25552 Pain in left hip: Secondary | ICD-10-CM

## 2016-03-06 DIAGNOSIS — F329 Major depressive disorder, single episode, unspecified: Secondary | ICD-10-CM

## 2016-03-06 DIAGNOSIS — K219 Gastro-esophageal reflux disease without esophagitis: Secondary | ICD-10-CM

## 2016-03-06 DIAGNOSIS — M109 Gout, unspecified: Secondary | ICD-10-CM | POA: Diagnosis not present

## 2016-03-06 DIAGNOSIS — E079 Disorder of thyroid, unspecified: Secondary | ICD-10-CM

## 2016-03-06 DIAGNOSIS — E1169 Type 2 diabetes mellitus with other specified complication: Secondary | ICD-10-CM

## 2016-03-06 DIAGNOSIS — F32A Depression, unspecified: Secondary | ICD-10-CM | POA: Insufficient documentation

## 2016-03-06 DIAGNOSIS — I10 Essential (primary) hypertension: Secondary | ICD-10-CM | POA: Diagnosis not present

## 2016-03-06 DIAGNOSIS — G2581 Restless legs syndrome: Secondary | ICD-10-CM

## 2016-03-06 DIAGNOSIS — E785 Hyperlipidemia, unspecified: Secondary | ICD-10-CM

## 2016-03-06 DIAGNOSIS — IMO0002 Reserved for concepts with insufficient information to code with codable children: Secondary | ICD-10-CM

## 2016-03-06 LAB — BAYER DCA HB A1C WAIVED: HB A1C (BAYER DCA - WAIVED): 8.1 % — ABNORMAL HIGH (ref <7.0)

## 2016-03-06 MED ORDER — HYDROCODONE-ACETAMINOPHEN 10-325 MG PO TABS: 1.0000 | ORAL_TABLET | Freq: Every evening | ORAL | 0 refills | Status: DC | PRN

## 2016-03-06 NOTE — Progress Notes (Signed)
Subjective:   Patient ID: [name], female    DOB: September 26, 1950, 65 y.o.   MRN: [id number] CC: Follow-up multiple med problems HPI: [name] is a 65 y.o. 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 Randel Pigg Has shunt for dialysis, has not yet been started on it Thought CKD due to DM2  HTN: has not taken BP meds at home Usually 140s/70s per pt Does have BP cuff at home, doesn't use often No CP, no SOB Says she had mild MI 20 yrs ago  Neuropathy: takes gabapentin at night, dose has been decreased recently with CKD  Hip pain: OA L side takes 105m hydrocodone at night to help with pain Has a hard time sleeping and getting comfortable without it Has appt with [facility] ortho for further eval  Relevant past medical, surgical, family and social history reviewed. Allergies and medications reviewed and updated. History  Smoking Status  . Never Smoker  Smokeless Tobacco  . Never Used   ROS: Per HPI   Objective:    BP (!) 170/73   Pulse 64   Temp 97.7 F (36.5 C) (Oral)   Ht 5' 7" (1.702 m)   Wt 186 lb 12.8 oz (84.7 kg)   BMI 29.26 kg/m   Wt Readings from Last 3 Encounters:  03/06/16 186 lb 12.8 oz (84.7 kg)  07/25/14 243 lb (110.2 kg)  07/18/14 247 lb (112 kg)    Gen: NAD, alert, cooperative with exam, NCAT EYES: EOMI, no conjunctival injection, or no icterus ENT:  TMs pearly gray b/l, OP without erythema LYMPH: no cervical LAD CV: NRRR, normal  S1/S2, no murmur, distal pulses 2+ b/l Resp: CTABL, no wheezes, normal WOB Abd: +BS, soft, NTND. no guarding or organomegaly Ext: No edema, warm Neuro: Alert and oriented  Assessment & Plan:  JLeviewas seen today for follow-up multiple med problems.  Diagnoses and all orders for this visit:  Essential hypertension Not controlled Check at home, write numbers down and bring to clinic next week for f/u appt Cont current meds Labs today  Hypothyroidism, unspecified type TSH today Cont levothyroxine  DM type 2, uncontrolled, with neuropathy ([place] On metformin CKD May need to stop metformin soon If elevated will add linagliptan -     Bayer DCA Hb A1c Waived  Dyslipidemia associated with type 2 diabetes mellitus (HCC) Cont statin, on both pravastatin and atorvastatin, stop atorvastatin -     Lipid panel  Gastroesophageal reflux disease without esophagitis Cont PPI  Chronic kidney disease, stage IV (severe) (HCC) Cont sodium bicarbonate -     CBC with Differential/Platelet -     CMP14+EGFR  Gout of knee, unspecified cause, unspecified chronicity, unspecified laterality No recent flares, Cont allopurinol  RLS (restless legs syndrome) Stable, cont ropirinole  Depression, unspecified depression type Well controlled, cont citalopram  Thyroid mass Was supposed to f/u with her endocrinologist in [place] -  Ambulatory referral to Endocrinology  Pain of left hip joint Gave #15 tabs Come back for initial pain appt 1 week -     HYDROcodone-acetaminophen (NORCO) 10-325 MG tablet; Take 1 tablet by mouth at bedtime as needed.   Follow up plan: Return in about 1 week (around 03/13/2016) for initial pain appt. Assunta Found, MD [place]

## 2016-03-07 LAB — CBC WITH DIFFERENTIAL/PLATELET
Basophils Absolute: 0.1 10*3/uL (ref 0.0–0.2)
Basos: 1 %
EOS (ABSOLUTE): 0.5 10*3/uL — ABNORMAL HIGH (ref 0.0–0.4)
EOS: 6 %
HEMOGLOBIN: 10.8 g/dL — AB (ref 11.1–15.9)
Hematocrit: 33.6 % — ABNORMAL LOW (ref 34.0–46.6)
IMMATURE GRANULOCYTES: 0 %
Immature Grans (Abs): 0 10*3/uL (ref 0.0–0.1)
LYMPHS ABS: 2.2 10*3/uL (ref 0.7–3.1)
Lymphs: 26 %
MCH: 27.4 pg (ref 26.6–33.0)
MCHC: 32.1 g/dL (ref 31.5–35.7)
MCV: 85 fL (ref 79–97)
MONOS ABS: 0.8 10*3/uL (ref 0.1–0.9)
Monocytes: 9 %
NEUTROS ABS: 4.9 10*3/uL (ref 1.4–7.0)
NEUTROS PCT: 58 %
Platelets: 375 10*3/uL (ref 150–379)
RBC: 3.94 x10E6/uL (ref 3.77–5.28)
RDW: 15.4 % (ref 12.3–15.4)
WBC: 8.4 10*3/uL (ref 3.4–10.8)

## 2016-03-07 LAB — CMP14+EGFR
A/G RATIO: 1.1 — AB (ref 1.2–2.2)
ALBUMIN: 3.5 g/dL — AB (ref 3.6–4.8)
ALT: 14 IU/L (ref 0–32)
AST: 20 IU/L (ref 0–40)
Alkaline Phosphatase: 118 IU/L — ABNORMAL HIGH (ref 39–117)
BILIRUBIN TOTAL: 0.2 mg/dL (ref 0.0–1.2)
BUN / CREAT RATIO: 20 (ref 12–28)
BUN: 29 mg/dL — ABNORMAL HIGH (ref 8–27)
CALCIUM: 9.3 mg/dL (ref 8.7–10.3)
CO2: 24 mmol/L (ref 18–29)
Chloride: 100 mmol/L (ref 96–106)
Creatinine, Ser: 1.45 mg/dL — ABNORMAL HIGH (ref 0.57–1.00)
GFR, EST AFRICAN AMERICAN: 43 mL/min/{1.73_m2} — AB (ref >59)
GFR, EST NON AFRICAN AMERICAN: 38 mL/min/{1.73_m2} — AB (ref >59)
Globulin, Total: 3.1 g/dL (ref 1.5–4.5)
Glucose: 204 mg/dL — ABNORMAL HIGH (ref 65–99)
POTASSIUM: 5.3 mmol/L — AB (ref 3.5–5.2)
Sodium: 139 mmol/L (ref 134–144)
TOTAL PROTEIN: 6.6 g/dL (ref 6.0–8.5)

## 2016-03-07 LAB — LIPID PANEL
CHOL/HDL RATIO: 4.2 ratio (ref 0.0–4.4)
Cholesterol, Total: 183 mg/dL (ref 100–199)
HDL: 44 mg/dL (ref >39)
LDL Calculated: 82 mg/dL (ref 0–99)
Triglycerides: 283 mg/dL — ABNORMAL HIGH (ref 0–149)
VLDL Cholesterol Cal: 57 mg/dL — ABNORMAL HIGH (ref 5–40)

## 2016-03-08 LAB — SPECIMEN STATUS REPORT

## 2016-03-08 LAB — TSH: TSH: 3.99 u[IU]/mL (ref 0.450–4.500)

## 2016-03-21 ENCOUNTER — Ambulatory Visit (INDEPENDENT_AMBULATORY_CARE_PROVIDER_SITE_OTHER): Payer: Medicare Other | Admitting: Pediatrics

## 2016-03-21 VITALS — BP 143/60 | HR 53 | Temp 97.0°F | Ht 67.0 in | Wt 181.4 lb

## 2016-03-21 DIAGNOSIS — IMO0002 Reserved for concepts with insufficient information to code with codable children: Secondary | ICD-10-CM

## 2016-03-21 DIAGNOSIS — E1140 Type 2 diabetes mellitus with diabetic neuropathy, unspecified: Secondary | ICD-10-CM

## 2016-03-21 DIAGNOSIS — R079 Chest pain, unspecified: Secondary | ICD-10-CM | POA: Diagnosis not present

## 2016-03-21 DIAGNOSIS — K219 Gastro-esophageal reflux disease without esophagitis: Secondary | ICD-10-CM

## 2016-03-21 DIAGNOSIS — G629 Polyneuropathy, unspecified: Secondary | ICD-10-CM | POA: Diagnosis not present

## 2016-03-21 DIAGNOSIS — E1165 Type 2 diabetes mellitus with hyperglycemia: Secondary | ICD-10-CM | POA: Diagnosis not present

## 2016-03-21 DIAGNOSIS — N184 Chronic kidney disease, stage 4 (severe): Secondary | ICD-10-CM

## 2016-03-21 DIAGNOSIS — I10 Essential (primary) hypertension: Secondary | ICD-10-CM

## 2016-03-21 DIAGNOSIS — E034 Atrophy of thyroid (acquired): Secondary | ICD-10-CM

## 2016-03-21 DIAGNOSIS — Z0289 Encounter for other administrative examinations: Secondary | ICD-10-CM | POA: Diagnosis not present

## 2016-03-21 DIAGNOSIS — E785 Hyperlipidemia, unspecified: Secondary | ICD-10-CM

## 2016-03-21 DIAGNOSIS — M25552 Pain in left hip: Secondary | ICD-10-CM | POA: Diagnosis not present

## 2016-03-21 MED ORDER — LEVOTHYROXINE SODIUM 125 MCG PO TABS: 62.5000 ug | ORAL_TABLET | Freq: Every day | ORAL | 3 refills | Status: DC

## 2016-03-21 MED ORDER — HYDROCODONE-ACETAMINOPHEN 10-325 MG PO TABS: 1.0000 | ORAL_TABLET | Freq: Every evening | ORAL | 0 refills | Status: DC | PRN

## 2016-03-21 MED ORDER — AMLODIPINE BESYLATE 10 MG PO TABS: 10.0000 mg | ORAL_TABLET | Freq: Every day | ORAL | 0 refills | Status: DC

## 2016-03-21 MED ORDER — OMEPRAZOLE 40 MG PO CPDR: 40.0000 mg | DELAYED_RELEASE_CAPSULE | Freq: Every day | ORAL | 1 refills | Status: DC

## 2016-03-21 MED ORDER — ATORVASTATIN CALCIUM 80 MG PO TABS: 80.0000 mg | ORAL_TABLET | Freq: Every day | ORAL | 1 refills | Status: DC

## 2016-03-21 MED ORDER — LINAGLIPTIN 5 MG PO TABS: 5.0000 mg | ORAL_TABLET | Freq: Every day | ORAL | 0 refills | Status: DC

## 2016-03-21 MED ORDER — HYDROCODONE-ACETAMINOPHEN 10-325 MG PO TABS: 1.0000 | ORAL_TABLET | Freq: Three times a day (TID) | ORAL | 0 refills | Status: DC | PRN

## 2016-03-21 MED ORDER — GABAPENTIN 800 MG PO TABS: 800.0000 mg | ORAL_TABLET | Freq: Every day | ORAL | 0 refills | Status: DC

## 2016-03-21 NOTE — Progress Notes (Signed)
Subjective:   Patient ID: [name], female    DOB: 1950/11/21, 65 y.o.   MRN: [id number] CC: Follow-up (2 week) multiple med prob HPI: [name] is a 65 y.o. female presenting for Follow-up (2 week)  HTN: BPs at home mostly >825 systolic, no HA, no CP  Hyperkalemia: taking KCl at home twice a day  DM2: AM BGLs 180s-200s, on metformin alone now  CKD: has appt with neprhology 1/7  HLD: on pravastatin, insurance prefers atorvastatin  OA in Hip: seen this week by ortho Prescribed oxycodone Told to come back in 1 year Told that she would have pain because of uneven leg length Has been taking hydrocodone nightly for months Not takin gduring the day  Neuropathy: ongoing pain at night in feet  Bunion: wants to follow up with podiatrist  Chest pain: dull aching pain constant for past 2-3 days Improves when she puts her L arm over her head Gets worse with driving, sometimes happens when sitting Still fixing up trailor for recent move  [place] and dairy gives her diarrhea  [place] Controlled Substance Abuse database reviewed- Yes If yes- were their any concerning findings : no Depression screen [facility] 2/9 03/21/2016 03/06/2016 03/30/2014 01/06/2014 08/08/2013  Decreased Interest 0 0 0 0 0  Down, Depressed, Hopeless 0 0 2 0 0  PHQ - 2 Score 0 0 2 0 0  Altered sleeping - - 3 - -  Tired, decreased energy - - 3 - -  Change in appetite - - 3 - -  Feeling bad or failure about yourself  - - 0 - -  Trouble concentrating - - 0 - -  Moving slowly or fidgety/restless - - 3 - -  Suicidal thoughts - - 0 - -  PHQ-9 Score - - 14 - -   [employer] drug screen performed- Yes, sent today  SOAPP  0= never  1= seldom  2=sometimes  3= often  4= very often  How often do you have mood swings? 0 How often do you smoke a cigarette within an hour after waling up? 0 How often have you taken medication other than the way that it was prescribed?0 How often have you used illegal drugs  in the past 5 years? 0 How often, in your lifetime, have you had legal problems or been arrested? 0  Score 0   Alcohol Audit - How often during the last year have found that you: 0-Never   1- Less than monthly   2- Monthly     3-Weekly     4-daily or almost daily  - found that you were not able to stop drinking once you started- 0 -failed to do what was normally expected of you because of drinking- 0 -needed a first drink in the morning- 0 -had a feeling of guilt or remorse after drinking- 0 -are/were unable to remember what happened the night before because of your drinking- 0  0- NO   2- yes but not in last year  4- yes during last year -Have you or someone else been injured because of your drinking- 0 - Has anyone been concerned about your drinking or suggested you cut down- 0        TOTAL- 0   ( 0-7- alcohol education, 8-15- simple advice, 16-19 simple advice plus counseling, 20-40 referral for evaluation and treatment 0  Pain assessment: Cause of pain- OA in hip, leg discrepancy Pain location- L hip Frequency- daily What increases pain-walking,  standing on hip for long periods of time What makes pain Better- rest, medication Effects on ADL - pain limits walking, not able to get comfortable at night Pain management agreement reviewed and signed- Yes, today   Relevant past medical, surgical, family and social history reviewed. Allergies and medications reviewed and updated. History  Smoking Status  . Never Smoker  Smokeless Tobacco  . Never Used   ROS: Per HPI   Objective:    BP (!) 143/60   Pulse (!) 53   Temp 97 F (36.1 C) (Oral)   Ht 5\' 7"  (1.702 m)   Wt 181 lb 6.4 oz (82.3 kg)   BMI 28.41 kg/m   Wt Readings from Last 3 Encounters:  03/21/16 181 lb 6.4 oz (82.3 kg)  03/06/16 186 lb 12.8 oz (84.7 kg)  07/25/14 243 lb (110.2 kg)    Gen: NAD, alert, cooperative with exam, NCAT EYES: EOMI, no conjunctival injection, or no icterus CV: NRRR, normal S1/S2,  no murmur, distal pulses 2+ b/l Resp: CTABL, no wheezes, normal WOB Abd: +BS, soft, NTND. no guarding or organomegaly Ext: No edema, warm Neuro: Alert and oriented, strength equal b/l UE and LE, coordination grossly normal MSK: normal muscle bulk  Assessment & Plan:  Nalia was seen today for follow-up multiple med problems  Diagnoses and all orders for this visit:  Essential hypertension Improved with recheck, still slightly elevated Cont to check at home, bring to next visit Cont below -     amLODipine (NORVASC) 10 MG tablet; Take 1 tablet (10 mg total) by mouth daily.  Pain of left hip joint Has been on 10mg  norco for months for chronic hip pain related to OA, leg length discrepancy Pain medication helps at night when her pain is at its worst  Gave 3 Rx dated 12/29, 1/29, 2/28 for #30 tabs of below Discussed and signed pain contract Must be seen for refills -     HYDROcodone-acetaminophen (NORCO) 10-325 MG tablet; Take 1 tablet by mouth at bedtime as needed. -     HYDROcodone-acetaminophen (NORCO) 10-325 MG tablet; Take 1 tablet by mouth every 8 (eight) hours as needed. -     HYDROcodone-acetaminophen (NORCO) 10-325 MG tablet; Take 1 tablet by mouth every 8 (eight) hours as needed.  DM type 2, uncontrolled, with neuropathy (HCC) Elevated A1c Start below Will need to dc metformin in future with CKD -     linagliptin (TRADJENTA) 5 MG TABS tablet; Take 1 tablet (5 mg total) by mouth daily.  Chronic kidney disease, stage IV (severe) (HCC) Has f/u with nephrology in a coupl eof weeks  Pain medication agreement signed -     ToxASSURE Select 13 (MW), Urine  Gastroesophageal reflux disease without esophagitis Well controlled as long as she takes below, cont -     omeprazole (PRILOSEC) 40 MG capsule; Take 1 capsule (40 mg total) by mouth daily.  Hypothyroidism due to acquired atrophy of thyroid Cont levothyroxine, has f/u with endocrine for thyroid mass -     levothyroxine  (SYNTHROID, LEVOTHROID) 125 MCG tablet; Take 0.5 tablets (62.5 mcg total) by mouth daily before breakfast.  Neuropathy (HCC) Cont below Cont to follow Cr -     gabapentin (NEURONTIN) 800 MG tablet; Take 1 tablet (800 mg total) by mouth at bedtime.  Hyperlipidemia, unspecified hyperlipidemia type Stop pravastatin Start below, per insurance preferences now -     atorvastatin (LIPITOR) 80 MG tablet; Take 1 tablet (80 mg total) by mouth daily at 6  PM.  Chest pain, unspecified type Atypical chest pain, worse with certain movements, improves with arms over her head EKG nsr, with no twave inversions, no qwaves Likely related to MSK with ongoing increased activity Discussed return precautions -     EKG 12-Lead  Follow up plan: Return in about 4 weeks (around 04/18/2016) for med prob f/u. Assunta Found, MD [place]

## 2016-03-25 ENCOUNTER — Other Ambulatory Visit: Payer: Self-pay | Admitting: *Deleted

## 2016-03-25 DIAGNOSIS — E034 Atrophy of thyroid (acquired): Secondary | ICD-10-CM

## 2016-03-25 DIAGNOSIS — E790 Hyperuricemia without signs of inflammatory arthritis and tophaceous disease: Secondary | ICD-10-CM

## 2016-03-25 DIAGNOSIS — K219 Gastro-esophageal reflux disease without esophagitis: Secondary | ICD-10-CM

## 2016-03-25 DIAGNOSIS — G2581 Restless legs syndrome: Secondary | ICD-10-CM

## 2016-03-25 MED ORDER — LEVOTHYROXINE SODIUM 125 MCG PO TABS: 62.5000 ug | ORAL_TABLET | Freq: Every day | ORAL | 3 refills | Status: AC

## 2016-03-25 MED ORDER — TORSEMIDE 20 MG PO TABS: 40.0000 mg | ORAL_TABLET | Freq: Every day | ORAL | 1 refills | Status: DC

## 2016-03-25 MED ORDER — ALLOPURINOL 100 MG PO TABS: 100.0000 mg | ORAL_TABLET | Freq: Every morning | ORAL | 2 refills | Status: AC

## 2016-03-25 MED ORDER — OMEPRAZOLE 40 MG PO CPDR: 40.0000 mg | DELAYED_RELEASE_CAPSULE | Freq: Every day | ORAL | 1 refills | Status: DC

## 2016-03-25 MED ORDER — CITALOPRAM HYDROBROMIDE 40 MG PO TABS
40.0000 mg | ORAL_TABLET | Freq: Every day | ORAL | 1 refills | Status: AC
Start: 2016-03-25 — End: ?

## 2016-03-25 MED ORDER — ROPINIROLE HCL 1 MG PO TABS: 1.0000 mg | ORAL_TABLET | Freq: Every day | ORAL | 1 refills | Status: AC

## 2016-03-25 MED ORDER — METFORMIN HCL 500 MG PO TABS: 500.0000 mg | ORAL_TABLET | Freq: Two times a day (BID) | ORAL | 1 refills | Status: AC

## 2016-03-25 NOTE — Addendum Note (Signed)
Addended by: Thana Ates on: 03/25/2016 08:21 AM   Modules accepted: Orders

## 2016-03-26 ENCOUNTER — Telehealth: Payer: Self-pay | Admitting: Pediatrics

## 2016-03-26 DIAGNOSIS — G629 Polyneuropathy, unspecified: Secondary | ICD-10-CM

## 2016-03-26 NOTE — Telephone Encounter (Signed)
You did the gabapentin, but it was sent to [facility], instead of [facility]. I do not see potassium or pravastatin in orders? I am leaving for vaca, route to pool if needed

## 2016-03-27 MED ORDER — GABAPENTIN 800 MG PO TABS: 800.0000 mg | ORAL_TABLET | Freq: Every day | ORAL | 0 refills | Status: DC

## 2016-03-27 NOTE — Telephone Encounter (Signed)
I sent in gabapentin to [facility]. She is not supposed to be taking pravastatin or potassium anymore, now on lipitor.

## 2016-03-27 NOTE — Telephone Encounter (Signed)
Aware. 

## 2016-03-28 LAB — TOXASSURE SELECT 13 (MW), URINE

## 2016-03-31 ENCOUNTER — Telehealth: Payer: Self-pay | Admitting: Pediatrics

## 2016-03-31 NOTE — Telephone Encounter (Signed)
Spoke with [facility] about metformin clarification   Dr Evette Doffing - her insurance will not cover the Tradjenta  - it was going to be 500.00  Can this be switched to something else??

## 2016-04-02 MED ORDER — GLIMEPIRIDE 1 MG PO TABS: 1.0000 mg | ORAL_TABLET | Freq: Every day | ORAL | 1 refills | Status: DC

## 2016-04-02 NOTE — Telephone Encounter (Signed)
Patient aware and verbalizes understanding. 

## 2016-04-02 NOTE — Telephone Encounter (Signed)
Continue the metformin, I sent in glimeperide instead of the tradjenta at a low dose. We will have to be careful because of her kidney function with whatever oral diabetic medicines she is on. She should check BGLs at home every morning. Bring to next clinic visit. If regularly less than 80 or more than 180 she should let me know sooner.

## 2016-04-11 ENCOUNTER — Telehealth: Payer: Self-pay | Admitting: Internal Medicine

## 2016-04-11 ENCOUNTER — Encounter: Payer: Self-pay | Admitting: Internal Medicine

## 2016-04-11 ENCOUNTER — Ambulatory Visit (INDEPENDENT_AMBULATORY_CARE_PROVIDER_SITE_OTHER): Payer: Medicare Other | Admitting: Internal Medicine

## 2016-04-11 VITALS — BP 138/80 | HR 62 | Ht 65.5 in | Wt 182.0 lb

## 2016-04-11 DIAGNOSIS — E041 Nontoxic single thyroid nodule: Secondary | ICD-10-CM | POA: Diagnosis not present

## 2016-04-11 DIAGNOSIS — E039 Hypothyroidism, unspecified: Secondary | ICD-10-CM | POA: Diagnosis not present

## 2016-04-11 NOTE — Telephone Encounter (Signed)
Patient stated she saw Dr Roland Earl as an Endo doctor.

## 2016-04-11 NOTE — Patient Instructions (Signed)
We will try to get the records from [facility] and then I will get in touch with you regarding the surgery referral (Dr. Harlow Asa).  Please come back for a follow-up appointment in 3 months.   Thyroid Nodule A thyroid nodule is an isolatedgrowth of thyroid cells that forms a lump in your thyroid gland. The thyroid gland is a butterfly-shaped gland. It is found in the lower front of your neck. This gland sends chemical messengers (hormones) through your blood to all parts of your body. These hormones are important in regulating your body temperature and helping your body to use energy. Thyroid nodules are common. Most are not cancerous (are benign). You may have one nodule or several nodules. Different types of thyroid nodules include:  Nodules that grow and fill with fluid (thyroid cysts).  Nodules that produce too much thyroid hormone (hot nodules or hyperthyroid).  Nodules that produce no thyroid hormone (cold nodules or hypothyroid).  Nodules that form from cancer cells (thyroid cancers). What are the causes? Usually, the cause of this condition is not known. What increases the risk? Factors that make this condition more likely to develop include:  Increasing age. Thyroid nodules become more common in people who are older than 67 years of age.  Gender.  Benign thyroid nodules are more common in women.  Cancerous (malignant) thyroid nodules are more common in men.  A family history that includes:  Thyroid nodules.  Pheochromocytoma.  Thyroid carcinoma.  Hyperparathyroidism.  Certain kinds of thyroid diseases, such as Hashimoto thyroiditis.  Lack of iodine.  A history of head and neck radiation, such as from X-rays. What are the signs or symptoms? It is common for this condition to cause no symptoms. If you have symptoms, they may include:  A lump in your lower neck.  Feeling a lump or tickle in your throat.  Pain in your neck, jaw, or ear.  Having trouble  swallowing. Hot nodules may cause symptoms that include:  Weight loss.  Warm, flushed skin.  Feeling hot.  Feeling nervous.  A racing heartbeat. Cold nodules may cause symptoms that include:  Weight gain.  Dry skin.  Brittle hair. This may also occur with hair loss.  Feeling cold.  Fatigue. Thyroid cancer nodules may cause symptoms that include:  Hard nodules that feel stuck to the thyroid gland.  Hoarseness.  Lumps in the glands near your thyroid (lymph nodes). How is this diagnosed? A thyroid nodule may be felt by your health care provider during a physical exam. This condition may also be diagnosed based on your symptoms. You may also have tests, including:  An ultrasound. This may be done to confirm the diagnosis.  A biopsy. This involves taking a sample from the nodule and looking at it under a microscope to see if the nodule is benign.  Blood tests to make sure that your thyroid is working properly.  Imaging tests such as MRI or CT scan may be done if:  Your nodule is large.  Your nodule is blocking your airway.  Cancer is suspected. How is this treated? Treatment depends on the cause and size of your nodule or nodules. If the nodule is benign, treatment may not be necessary. Your health care provider may monitor the nodule to see if it goes away without treatment. If the nodule continues to grow, is cancerous, or does not go away:  It may need to be drained with a needle.  It may need to be removed with surgery. If  you have surgery, part or all of your thyroid gland may need to be removed as well. Follow these instructions at home:  Pay attention to any changes in your nodule.  Take over-the-counter and prescription medicines only as told by your health care provider.  Keep all follow-up visits as told by your health care provider. This is important. Contact a health care provider if:  Your voice changes.  You have trouble swallowing.  You have  pain in your neck, ear, or jaw that is getting worse.  Your nodule gets bigger.  Your nodule starts to make it harder for you to breathe. Get help right away if:  You have a sudden fever.  You feel very weak.  Your muscles look like they are shrinking (muscle wasting).  You have mood swings.  You feel very restless.  You feel confused.  You are seeing or hearing things that other people do not see or hear (having hallucinations).  You feel suddenly nauseous or throw up.  You suddenly have diarrhea.  You have chest pain.  There is a loss of consciousness. This information is not intended to replace advice given to you by your health care provider. Make sure you discuss any questions you have with your health care provider. Document Released: 02/01/2004 Document Revised: 11/11/2015 Document Reviewed: 06/21/2014 Elsevier Interactive Patient Education  2017 [employer].

## 2016-04-11 NOTE — Telephone Encounter (Signed)
Noted, thank you

## 2016-04-11 NOTE — Progress Notes (Addendum)
Patient ID: [name], female   DOB: 12-07-1950, 66 y.o.   MRN: [id number]    HPI  [name] is a 66 y.o.-year-old female, referred by her PCP, Eustaquio Maize, MD, for evaluation for a thyroid mass.  She had an MRI of the neck 6 mo ago by orthopedic Dr. >> incidentally showed a thyroid nodule >> sent to see an endocrinologist >> she had a Thyroid U/S and then a benign biopsy of this nodule [facility]. In [place], [place]) >> She was advised to have surgery as she had neck compression sxs. However, she moved to [facility] 01/2016. She is interested to go ahead with the surgery.  Thyroid mass: - no records available  Pt admits for: - + voice change - + sometimes she gets choked with liquids - + feeling nodules in R neck - + hoarseness - no SOB with lying down  She also has hypothyroidism: - She takes 1/2 of a 125 mcg LT4 daily: - fasting - with water - eats b'fast >1h min later - no PPI, MVI, Ca, Fe  I reviewed pt's thyroid tests: Lab Results  Component Value Date   TSH 3.990 03/06/2016   TSH 0.909 03/30/2014   TSH 7.490 (H) 10/26/2013   TSH 3.660 07/04/2013   TSH 2.375 09/03/2012   TSH 3.055 07/26/2012   FREET4 1.16 10/28/2013   Pt denies - fatigue - heat intolerance/cold intolerance - tremors - palpitations - anxiety/depression - hyperdefecation/constipation - weight loss/weight gain - dry skin - hair loss  She lost more than 60 pounds in the last 1.5 years >> her DM2 became better controlled >> came off insulin.  No FH of thyroid ds. No FH of thyroid cancer. No h/o radiation tx to head or neck.  No seaweed or kelp. No recent contrast studies. No steroid use. No herbal supplements. No Biotin supplements or Hair, Skin and Nails vitamins.  Pt also has a history of DM2 02/2016: 8.1%), HTH, HL, CKD stage 3-4.  ROS: Constitutional: no weight gain/loss, no fatigue, no subjective hyperthermia/hypothermia Eyes: no blurry vision, no xerophthalmia ENT: no sore  throat, + see HPI, + hypoacusis, + tinnitus Cardiovascular: no CP/SOB/palpitations/leg swelling Respiratory: no cough/SOB Gastrointestinal: no N/V/D/C Musculoskeletal: no muscle/joint aches Skin: no rashes, + easy bruising Neurological: no tremors/numbness/tingling/dizziness Psychiatric: no depression/anxiety  Past Medical History:  Diagnosis Date  . Anemia   . Arthritis   . Chronic kidney disease, stage III (moderate)   . COPD (chronic obstructive pulmonary disease) ([place])   . Depression   . Essential hypertension   . GERD (gastroesophageal reflux disease)   . Gout   . H/O hiatal hernia   . Headache(784.0)   . History of cellulitis    Neck  . History of skin cancer   . Hyperlipidemia   . Hypothyroidism   . Low back pain   . Obese   . RLS (restless legs syndrome)    Sleep study in 2010: No apnea; moderate periodic movements  . Type 2 diabetes mellitus ([place])   . Upper GI bleed   . Vitiligo    Past Surgical History:  Procedure Laterality Date  . ABDOMINAL HYSTERECTOMY    . APPENDECTOMY    . AV FISTULA PLACEMENT Left 07/16/2012   Procedure: ARTERIOVENOUS (AV) FISTULA CREATION;  Surgeon: Mal Misty, MD;  Location: [facility] [place];  Service: Vascular;  Laterality: Left;  . AV FISTULA PLACEMENT Left 07/29/2012   Procedure: ARTERIOVENOUS (AV) FISTULA CREATION;  Surgeon: Mal Misty,  MD;  Location[facility];  Service: Vascular;  Laterality: Left;  Left Brachial-Cephalic Arteriovenous Fistula Creation  . [place], 2011   Right in 2011; previously bilateral  . COLONOSCOPY  02/2002, 07/2009   few diverticula, otherwise negative  . COLONOSCOPY WITH ESOPHAGOGASTRODUODENOSCOPY (EGD) N/A 08/27/2012   RMR: Normal esophagus s/p 54 F dilator. Small hiatal hernia. Gastric polyp removed, oozing (hyperplastic), which could explain heme positive stool:Colonic diverticulosis  . ESOPHAGOGASTRODUODENOSCOPY N/A 10/27/2013   Dr.Rourk- esophageal mucosa appeared normal. tubular  esophagus widely patent throughout its course. stomach empty. patient had longitudinal proximal gastric ersions overlying multiple proximal folds. nodular mucosa with overlying antral erosions as well. no ulcer or infiltrating process seen patent pylorus, normal first and second portion of the duodenum. bx= reactive gastropathy  . FOOT SURGERY  1984   Bilateral heel spurs  . INCISE AND DRAIN ABCESS  2010   Neck-MRSA  . MALONEY DILATION N/A 10/27/2013   Procedure: Venia Minks DILATION;  Surgeon: Daneil Dolin, MD;  [facility];  Service: Endoscopy;  Laterality: N/A;  . PARTIAL HYSTERECTOMY  1985   Vaginal  . REVISON OF ARTERIOVENOUS FISTULA Left 08/03/2013   Procedure: BANDING OF BRACHIOCEPHALIC ARTERIOVENOUS FISTULA;  Surgeon: Conrad V[place], MD;  Location: [place];  Service: Vascular;  Laterality: Left;  . SAVORY DILATION N/A 10/27/2013   Procedure: SAVORY DILATION;  Surgeon: Daneil Dolin, MD;  [facility];  Service: Endoscopy;  Laterality: N/A;  . SHOULDER SURGERY  1997   Left  . SHOULDER SURGERY     RIGHT  ADJUSTED   . SHUNTOGRAM Left 08/01/2013   Procedure: FISTULOGRAM;  Surgeon: Conrad [place], MD;  Location: [facility] CATH LAB;  Service: Cardiovascular;  Laterality: Left;  . THROMBECTOMY W/ EMBOLECTOMY Left 08/03/2013   Procedure: THROMBECTOMY OF ARTERIOVENOUS FISTULA  AND REVISION OF  ARTERIOVENOUS FISTULA BANDING.;  Surgeon: Conrad [place], MD;  Location: [facility];  Service: Vascular;  Laterality: Left;  . TUBAL LIGATION  1976   Social History   Social History  . Marital status: Divorced    Spouse name: N/A  . Number of children: 3  . Years of education: N/A   Occupational History  . Disabled     previously Nurse, adult   Social History Main Topics  . Smoking status: Never Smoker  . Smokeless tobacco: Never Used  . Alcohol use No  . Drug use: No  . Sexual activity: No   Other Topics Concern  . Not on file   Social History Narrative   No regular exercise   Current  Outpatient Prescriptions on File Prior to Visit  Medication Sig Dispense Refill  . allopurinol (ZYLOPRIM) 100 MG tablet Take 1 tablet (100 mg total) by mouth every morning. 90 tablet 2  . amLODipine (NORVASC) 10 MG tablet Take 1 tablet (10 mg total) by mouth daily. 90 tablet 0  . atorvastatin (LIPITOR) 80 MG tablet Take 1 tablet (80 mg total) by mouth daily at 6 PM. 90 tablet 1  . citalopram (CELEXA) 40 MG tablet Take 1 tablet (40 mg total) by mouth daily. 90 tablet 1  . gabapentin (NEURONTIN) 800 MG tablet Take 1 tablet (800 mg total) by mouth at bedtime. 90 tablet 0  . glimepiride (AMARYL) 1 MG tablet Take 1 tablet (1 mg total) by mouth daily with breakfast. 90 tablet 1  . HYDROcodone-acetaminophen (NORCO) 10-325 MG tablet Take 1 tablet by mouth every 8 (eight) hours as needed. 30 tablet  0  . levothyroxine (SYNTHROID, LEVOTHROID) 125 MCG tablet Take 0.5 tablets (62.5 mcg total) by mouth daily before breakfast. 135 tablet 3  . metFORMIN (GLUCOPHAGE) 500 MG tablet Take 1 tablet (500 mg total) by mouth 2 (two) times daily with a meal. 180 tablet 1  . metoprolol succinate (TOPROL-XL) 50 MG 24 hr tablet Take 50 mg by mouth daily. Take with or immediately following a meal.    . omeprazole (PRILOSEC) 40 MG capsule Take 1 capsule (40 mg total) by mouth daily. 90 capsule 1  . rOPINIRole (REQUIP) 1 MG tablet Take 1 tablet (1 mg total) by mouth at bedtime. 90 tablet 1  . sodium bicarbonate 650 MG tablet Take 1 tablet (650 mg total) by mouth 3 (three) times daily. (Patient taking differently: Take 650 mg by mouth 2 (two) times daily. ) 270 tablet 1  . torsemide (DEMADEX) 20 MG tablet Take 2 tablets (40 mg total) by mouth daily. 90 tablet 1  . traZODone (DESYREL) 50 MG tablet Take 50 mg by mouth at bedtime.    . nitroGLYCERIN (NITROSTAT) 0.4 MG SL tablet Place 1 tablet (0.4 mg total) under the tongue every 5 (five) minutes as needed for chest pain. (Patient not taking: Reported on 03/21/2016) 50 tablet 0    No current facility-administered medications on file prior to visit.    Allergies  Allergen Reactions  . Latex Shortness Of Breath and Swelling    Facial swelling  . Vancomycin Shortness Of Breath  . Vancomycin Hcl Swelling    REACTION: facial swelling  . Adhesive [Tape] Rash    Plastic tape pulls her skin off when it is removed. Paper tape is ok per patient.  . Quinapril Hcl Swelling and Rash    Arm swelling   Family History  Problem Relation Age of Onset  . Emphysema Mother   . COPD Mother   . Colon cancer Father   . Diabetes Father   . Hypertension Father   . Rheum arthritis Sister   . Colon cancer Brother 84  . Congestive Heart Failure Maternal Grandmother   . Cancer Paternal Grandmother    PE: BP 138/80 (BP Location: Right Arm, Patient Position: Sitting)   Pulse 62   Ht 5' 5.5" (1.664 m)   Wt 182 lb (82.6 kg)   SpO2 96%   BMI 29.83 kg/m  Wt Readings from Last 3 Encounters:  04/11/16 182 lb (82.6 kg)  03/21/16 181 lb 6.4 oz (82.3 kg)  03/06/16 186 lb 12.8 oz (84.7 kg)   Constitutional: overweight, in NAD Eyes: PERRLA, EOMI, no exophthalmos ENT: moist mucous membranes, no thyromegaly, + right thyroid lobe fullness, however, no individual nodule palpated, no cervical lymphadenopathy Cardiovascular: RRR, No MRG Respiratory: CTA B Gastrointestinal: abdomen soft, NT, ND, BS+ Musculoskeletal: no deformities, strength intact in all 4;  Skin: moist, warm, no rashes, + multiple ecchymoses on forearms Neurological: no tremor with outstretched hands, DTR normal in all 4  ASSESSMENT: 1. R Thyroid mass  2. Hypothyroidism  PLAN: 1.  - Patient with an incidentally found large thyroid nodule, however, no records available. Her thyroid mass was found incidentally on an MRI done to evaluate her cervical spine. She was then sent to have a thyroid ultrasound and subsequently a thyroid biopsy. Reportedly, this was benign. However, since she had (continues to have) neck  compression symptoms: Hoarseness, voice change, and occasional dysphagia with liquids, she was suggested to have surgery. This was scheduled for December of last year, however, she moved  to [place]) in November of last year. She would like to establish care with me and she is interested in surgery to remove this nodule. I did discuss with her that since the nodule was not cancerous, the only indication for surgery is the severity of her neck compression symptoms. She mentions that they are severe enough to make her desire surgery. - At this point, we discussed that the next step would be to obtain records from her thyroid ultrasound and biopsy. After this, will refer her to surgery for either lobectomy or total thyroidectomy, depending on the ultrasound results.  - She cannot remember the name of the previous endocrinologist that ordered the thyroid ultrasound and the biopsy. If we cannot obtain them in due time, we'll need to obtain another thyroid ultrasound here. - I advised pt to join my chart and I will send her the results through there   2. Hypothyroidism - This is controlled on a low dose of levothyroxine, 62.5 g daily. Last TSH was normal a month ago. - She takes levothyroxine correctly - We discussed that if she were to have thyroid surgery, we may not need to increase the dose of levothyroxine if she has hemithyroidectomy, but will definitely need to do so if she has total thyroidectomy  Addendum: Patient called after her appointment to let me know that she saw Dr.Vore with endocrinology.  Addendum (04/18/2016): Received records from [facility] in [place]: 10/16/2015: Thyroid ultrasound:  R lobe: There is a large dominant mass involving the right lobe of the thyroid gland measuring 3.7 x 2.1 cm.  L lobe: Normal in size, 2.9 x 1.1 cm. Slight inhomogeneous echotexture.   Isthmus: Normal 10/16/2015: FNA of the right thyroid  nodule: Follicular cells including some with Hurthle cell change admixed with abundant colloid, consistent with benign follicular nodule (Bethesda category II).  Based on the above results, the nodule is large, but benign. She does not absolutely needs surgery at this point, however, she is bothered by neck compression symptoms, so in this case, I think right lobectomy would be a good idea for her.  Addendum (04/21/2016): Upon discussion with the patient, she would like a referral to surgery for lobectomy.   Philemon Kingdom, MD PhD [facility] Endocrinology

## 2016-04-11 NOTE — Telephone Encounter (Signed)
Patient stated see saw Dr Roland Earl as an endo doctor.

## 2016-04-18 ENCOUNTER — Encounter (HOSPITAL_COMMUNITY): Payer: Self-pay | Admitting: *Deleted

## 2016-04-18 ENCOUNTER — Emergency Department (HOSPITAL_COMMUNITY)
Admission: EM | Admit: 2016-04-18 | Discharge: 2016-04-18 | Disposition: A | Discharge status: Home / Self Care | Payer: Medicare Other | Attending: Emergency Medicine | Admitting: Emergency Medicine

## 2016-04-18 ENCOUNTER — Emergency Department (HOSPITAL_COMMUNITY): Payer: Medicare Other

## 2016-04-18 DIAGNOSIS — Y939 Activity, unspecified: Secondary | ICD-10-CM | POA: Diagnosis not present

## 2016-04-18 DIAGNOSIS — N186 End stage renal disease: Secondary | ICD-10-CM | POA: Insufficient documentation

## 2016-04-18 DIAGNOSIS — W01198A Fall on same level from slipping, tripping and stumbling with subsequent striking against other object, initial encounter: Secondary | ICD-10-CM | POA: Diagnosis not present

## 2016-04-18 DIAGNOSIS — Y929 Unspecified place or not applicable: Secondary | ICD-10-CM | POA: Diagnosis not present

## 2016-04-18 DIAGNOSIS — S299XXA Unspecified injury of thorax, initial encounter: Secondary | ICD-10-CM | POA: Diagnosis present

## 2016-04-18 DIAGNOSIS — J449 Chronic obstructive pulmonary disease, unspecified: Secondary | ICD-10-CM | POA: Diagnosis not present

## 2016-04-18 DIAGNOSIS — Z79899 Other long term (current) drug therapy: Secondary | ICD-10-CM | POA: Diagnosis not present

## 2016-04-18 DIAGNOSIS — W19XXXA Unspecified fall, initial encounter: Secondary | ICD-10-CM

## 2016-04-18 DIAGNOSIS — I120 Hypertensive chronic kidney disease with stage 5 chronic kidney disease or end stage renal disease: Secondary | ICD-10-CM | POA: Diagnosis not present

## 2016-04-18 DIAGNOSIS — E1122 Type 2 diabetes mellitus with diabetic chronic kidney disease: Secondary | ICD-10-CM | POA: Diagnosis not present

## 2016-04-18 DIAGNOSIS — Y999 Unspecified external cause status: Secondary | ICD-10-CM | POA: Diagnosis not present

## 2016-04-18 DIAGNOSIS — Z7984 Long term (current) use of oral hypoglycemic drugs: Secondary | ICD-10-CM | POA: Insufficient documentation

## 2016-04-18 DIAGNOSIS — E039 Hypothyroidism, unspecified: Secondary | ICD-10-CM | POA: Insufficient documentation

## 2016-04-18 DIAGNOSIS — R0781 Pleurodynia: Secondary | ICD-10-CM | POA: Diagnosis not present

## 2016-04-18 MED ORDER — LIDOCAINE 5 % EX PTCH: 1.0000 | MEDICATED_PATCH | CUTANEOUS | 0 refills | Status: DC

## 2016-04-18 MED ORDER — HYDROCODONE-ACETAMINOPHEN 5-325 MG PO TABS: 1.0000 | ORAL_TABLET | Freq: Four times a day (QID) | ORAL | 0 refills | Status: DC | PRN

## 2016-04-18 NOTE — ED Provider Notes (Signed)
[facility] DEPT Provider Note   CSN: [id number] Arrival date & time: 04/18/16  1546     History   Chief Complaint Chief Complaint  Patient presents with  . Fall    HPI LETIA GUIDRY is a 66 y.o. female.  HPI   CLETIS MUMA is a 66 y.o. female, with a history of COPD, CKD, and HTN, presenting to the ED with Left rib pain beginning this afternoon. Patient states she tripped over her dog and hit her left ribs on the arm of the chair. Pain is sharp, moderate to severe, nonradiating, worse with inspiration and movement. She is a nonsmoker. She denies shortness of breath, neck/back pain, LOC, head injury, or any other complaints or injuries. She states she already has an appointment set up with her PCP on January 29.      Past Medical History:  Diagnosis Date  . Anemia   . Arthritis   . Chronic kidney disease, stage III (moderate)   . COPD (chronic obstructive pulmonary disease) ([place])   . Depression   . Essential hypertension   . GERD (gastroesophageal reflux disease)   . Gout   . H/O hiatal hernia   . Headache(784.0)   . History of cellulitis    Neck  . History of skin cancer   . Hyperlipidemia   . Hypothyroidism   . Low back pain   . Obese   . RLS (restless legs syndrome)    Sleep study in 2010: No apnea; moderate periodic movements  . Type 2 diabetes mellitus ([place])   . Upper GI bleed   . Vitiligo     Patient Active Problem List   Diagnosis Date Noted  . Thyroid nodule 04/11/2016  . Pain medication agreement signed 03/21/2016  . Neuropathy ([place]) 03/21/2016  . Depression 03/06/2016  . Acute bronchitis 07/01/2014  . Non-ST elevation MI (NSTEMI) ([place]) 07/01/2014  . Mucopurulent chronic bronchitis ([place]) 03/30/2014  . Hypokalemia 03/30/2014  . Dyslipidemia associated with type 2 diabetes mellitus ([place]) 12/01/2013  . Hypoglycemia 10/26/2013  . Hyperglycemia 10/22/2013  . Cholecystitis 10/22/2013  . Sensation of cold in finger-Left 09/12/2013  . Pain of  left hand 09/12/2013  . Numbness of left hand 07/29/2013  . Cramping of hands 07/29/2013  . 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 End stage renal disease ([place]) 12/13/2012  . Encounter for routine gynecological examination 09/30/2012  . A-V fistula ([place]) 09/14/2012  . Heme positive stool 08/18/2012  . Psoriasis 07/26/2012  . Pain of left hip joint 07/26/2012  . DM type 2, uncontrolled, with neuropathy ([place]) 04/29/2012  . Chest pain   . RLS (restless legs syndrome)   . Chronic kidney disease, stage IV (severe) ([place])   . Gout 11/22/2009  . Anemia, normocytic normochromic 07/24/2009  . Hypothyroidism 01/22/2009  . Hyperlipidemia 01/22/2009  . Obesity 01/22/2009  . Hypertension 01/22/2009  . Gastroesophageal reflux disease 01/22/2009    Past Surgical History:  Procedure Laterality Date  . ABDOMINAL HYSTERECTOMY    . APPENDECTOMY    . AV FISTULA PLACEMENT Left 07/16/2012   Procedure: ARTERIOVENOUS (AV) FISTULA CREATION;  Surgeon: Mal Misty, MD;  Location: [facility];  Service: Vascular;  Laterality: Left;  . AV FISTULA PLACEMENT Left 07/29/2012   Procedure: ARTERIOVENOUS (AV) FISTULA CREATION;  Surgeon: Mal Misty, MD;  Location: [facility];  Service: Vascular;  Laterality: Left;  Left Brachial-Cephalic Arteriovenous Fistula Creation  . [place], 2011  Right in 2011; previously bilateral  . COLONOSCOPY  02/2002, 07/2009   few diverticula, otherwise negative  . COLONOSCOPY WITH ESOPHAGOGASTRODUODENOSCOPY (EGD) N/A 08/27/2012   RMR: Normal esophagus s/p 54 F dilator. Small hiatal hernia. Gastric polyp removed, oozing (hyperplastic), which could explain heme positive stool:Colonic diverticulosis  . ESOPHAGOGASTRODUODENOSCOPY N/A 10/27/2013   Dr.Rourk- esophageal mucosa appeared normal. tubular esophagus widely patent throughout its course. stomach empty. patient had longitudinal proximal gastric ersions overlying multiple  proximal folds. nodular mucosa with overlying antral erosions as well. no ulcer or infiltrating process seen patent pylorus, normal first and second portion of the duodenum. bx= reactive gastropathy  . FOOT SURGERY  1984   Bilateral heel spurs  . INCISE AND DRAIN ABCESS  2010   Neck-MRSA  . MALONEY DILATION N/A 10/27/2013   Procedure: Venia Minks DILATION;  Surgeon: Daneil Dolin, MD;  [facility];  Service: Endoscopy;  Laterality: N/A;  . PARTIAL HYSTERECTOMY  1985   Vaginal  . REVISON OF ARTERIOVENOUS FISTULA Left 08/03/2013   Procedure: BANDING OF BRACHIOCEPHALIC ARTERIOVENOUS FISTULA;  Surgeon: Conrad [place], MD;  Location: [facility];  Service: Vascular;  Laterality: Left;  . SAVORY DILATION N/A 10/27/2013   Procedure: SAVORY DILATION;  Surgeon: Daneil Dolin, MD;  [facility];  Service: Endoscopy;  Laterality: N/A;  . SHOULDER SURGERY  1997   Left  . SHOULDER SURGERY     RIGHT  ADJUSTED   . SHUNTOGRAM Left 08/01/2013   Procedure: FISTULOGRAM;  Surgeon: Conrad [place]dale, MD;  Location: [facility] CATH LAB;  Service: Cardiovascular;  Laterality: Left;  . THROMBECTOMY W/ EMBOLECTOMY Left 08/03/2013   Procedure: THROMBECTOMY OF ARTERIOVENOUS FISTULA  AND REVISION OF  ARTERIOVENOUS FISTULA BANDING.;  Surgeon: Conrad [place], MD;  Location: [facility];  Service: Vascular;  Laterality: Left;  . TUBAL LIGATION  1976    OB History    Gravida Para Term Preterm AB Living             3   SAB TAB Ectopic Multiple Live Births                   Home Medications    Prior to Admission medications   Medication Sig Start Date End Date Taking? Authorizing Provider  allopurinol (ZYLOPRIM) 100 MG tablet Take 1 tablet (100 mg total) by mouth every morning. 03/25/16   Eustaquio Maize, MD  amLODipine (NORVASC) 10 MG tablet Take 1 tablet (10 mg total) by mouth daily. 03/21/16   Eustaquio Maize, MD  atorvastatin (LIPITOR) 80 MG tablet Take 1 tablet (80 mg total) by mouth daily at 6 PM. 03/21/16   Eustaquio Maize, MD  citalopram (CELEXA) 40 MG tablet Take 1 tablet (40 mg total) by mouth daily. 03/25/16   Eustaquio Maize, MD  gabapentin (NEURONTIN) 800 MG tablet Take 1 tablet (800 mg total) by mouth at bedtime. 03/27/16   Eustaquio Maize, MD  glimepiride (AMARYL) 1 MG tablet Take 1 tablet (1 mg total) by mouth daily with breakfast. 04/02/16   Eustaquio Maize, MD  HYDROcodone-acetaminophen [facility]) 10-325 MG tablet Take 1 tablet by mouth every 8 (eight) hours as needed. 03/21/16   Eustaquio Maize, MD  HYDROcodone-acetaminophen (NORCO/VICODIN) 5-325 MG tablet Take 1 tablet by mouth every 6 (six) hours as needed for severe pain. 04/18/16   Pawel Soules C Xanthe Couillard, PA-C  levothyroxine (SYNTHROID, LEVOTHROID) 125 MCG tablet Take 0.5 tablets (62.5 mcg total) by mouth daily before breakfast.  03/25/16   Eustaquio Maize, MD  lidocaine (LIDODERM) 5 % Place 1 patch onto the skin daily. Remove & Discard patch within 12 hours or as directed by MD 04/18/16   Lorayne Bender, PA-C  metFORMIN (GLUCOPHAGE) 500 MG tablet Take 1 tablet (500 mg total) by mouth 2 (two) times daily with a meal. 03/25/16   Eustaquio Maize, MD  metoprolol succinate (TOPROL-XL) 50 MG 24 hr tablet Take 50 mg by mouth daily. Take with or immediately following a meal.    Historical Provider, MD  nitroGLYCERIN (NITROSTAT) 0.4 MG SL tablet Place 1 tablet (0.4 mg total) under the tongue every 5 (five) minutes as needed for chest pain. Patient not taking: Reported on 03/21/2016 09/30/13   Wardell Honour, MD  omeprazole (PRILOSEC) 40 MG capsule Take 1 capsule (40 mg total) by mouth daily. 03/25/16   Eustaquio Maize, MD  rOPINIRole (REQUIP) 1 MG tablet Take 1 tablet (1 mg total) by mouth at bedtime. 03/25/16   Eustaquio Maize, MD  sodium bicarbonate 650 MG tablet Take 1 tablet (650 mg total) by mouth 3 (three) times daily. Patient taking differently: Take 650 mg by mouth 2 (two) times daily.  03/30/14   Mary-Margaret Hassell Done, FNP  torsemide (DEMADEX) 20 MG tablet Take 2 tablets (40 mg  total) by mouth daily. 03/25/16   Eustaquio Maize, MD  traZODone (DESYREL) 50 MG tablet Take 50 mg by mouth at bedtime.    Historical Provider, MD    Family History Family History  Problem Relation Age of Onset  . Emphysema Mother   . COPD Mother   . Colon cancer Father   . Diabetes Father   . Hypertension Father   . Rheum arthritis Sister   . Colon cancer Brother 46  . Congestive Heart Failure Maternal Grandmother   . Cancer Paternal Grandmother     Social History Social History  Substance Use Topics  . Smoking status: Never Smoker  . Smokeless tobacco: Never Used  . Alcohol use No     Allergies   Latex; Vancomycin; Vancomycin hcl; Adhesive [tape]; and Quinapril hcl   Review of Systems Review of Systems  Respiratory: Negative for shortness of breath.   Gastrointestinal: Negative for abdominal pain.  Musculoskeletal: Negative for back pain and neck pain.       Left rib pain  Neurological: Negative for dizziness, weakness, light-headedness, numbness and headaches.     Physical Exam Updated Vital Signs BP 148/69   Pulse 80   Temp 98 F (36.7 C) (Temporal)   Resp 20   Ht 5\' 7"  (1.702 m)   Wt 81.2 kg   SpO2 100%   BMI 28.04 kg/m   Physical Exam  Constitutional: She appears well-developed and well-nourished. No distress.  HENT:  Head: Normocephalic and atraumatic.  Eyes: Conjunctivae are normal.  Neck: Neck supple.  Cardiovascular: Normal rate, regular rhythm, normal heart sounds and intact distal pulses.   Pulmonary/Chest: Effort normal and breath sounds normal. No respiratory distress.  No increased work of breathing. Patient speaks in full sentences without difficulty.  Abdominal: Soft. There is no tenderness. There is no guarding.  Musculoskeletal: She exhibits no edema.  Tenderness over the left anterior lateral ribs around 7 through 9 without noted deformity, crepitus, or instability. Normal motor function intact in all extremities and spine. No midline  spinal tenderness.   Lymphadenopathy:    She has no cervical adenopathy.  Neurological: She is alert.  Skin: Skin is warm  and dry. She is not diaphoretic.  Psychiatric: She has a normal mood and affect. Her behavior is normal.  Nursing note and vitals reviewed.    ED Treatments / Results  Labs (all labs ordered are listed, but only abnormal results are displayed) Labs Reviewed - No data to display  EKG  EKG Interpretation None       Radiology Dg Ribs Unilateral W/chest Left  Result Date: 04/18/2016 CLINICAL DATA:  Fall with left rib injury.  Initial encounter. EXAM: LEFT RIBS AND CHEST - 3+ VIEW COMPARISON:  None. FINDINGS: No fracture or other bone lesions are seen involving the ribs. There is no evidence of pneumothorax or pleural effusion. Both lungs are clear. Heart size and mediastinal contours are within normal limits. IMPRESSION: Negative for fracture or other acute finding. Electronically Signed   By: Monte Fantasia M.D.   On: 04/18/2016 16:21    Procedures Procedures (including critical care time)  Medications Ordered in ED Medications - No data to display   Initial Impression / Assessment and Plan / ED Course  I have reviewed the triage vital signs and the nursing notes.  Pertinent labs & imaging results that were available during my care of the patient were reviewed by me and considered in my medical decision making (see chart for details).     Patient presents with left rib injury following a mechanical fall. No abnormalities on x-ray. Suspect rib contusion. Spirometer provided along with use instructions and purpose. Patient states she usually has Norco at home for pain, however, she has run out of this medication. Expectations for possible long-term pain were explained to the patient. Encouraged chronic PCP management. Return precautions discussed. Patient voices understanding of all instructions and is comfortable with discharge.  Findings and plan of care  discussed with Milton Ferguson, MD.   Vitals:   04/18/16 1554 04/18/16 1555  BP: 148/69   Pulse: 80   Resp: 20   Temp: 98 F (36.7 C)   TempSrc: Temporal   SpO2: 100%   Weight:  81.2 kg  Height:  5\' 7"  (1.702 m)      Final Clinical Impressions(s) / ED Diagnoses   Final diagnoses:  Fall, initial encounter  Rib pain on left side    New Prescriptions New Prescriptions   HYDROCODONE-ACETAMINOPHEN (NORCO/VICODIN) 5-325 MG TABLET    Take 1 tablet by mouth every 6 (six) hours as needed for severe pain.   LIDOCAINE (LIDODERM) 5 %    Place 1 patch onto the skin daily. Remove & Discard patch within 12 hours or as directed by MD     Lorayne Bender, PA-C 04/18/16 1654    Milton Ferguson, MD 04/19/16 2259

## 2016-04-18 NOTE — Discharge Instructions (Signed)
There were no abnormalities on the chest and rib x-ray. Be sure to use the incentive spirometer throughout the day to reduce risk of developing pneumonia.  The lidocaine patches may help reduce some pain in the area. There are OTC versions available that may be cheaper, such as those from [employer].  May use Tylenol for pain. Alternatively, you may use Vicodin for severe pain. Do not drive or perform other dangerous activities while taking the Vicodin. Use caution as this medication can cause drowsiness or dizziness. Do not take the vicodin and tylenol at the same time, as they both contain acetaminophen. Follow-up with your PCP as soon as possible on this matter for chronic management.

## 2016-04-18 NOTE — ED Triage Notes (Signed)
Pt states she tripped over her dog and hit her left side ribes on the arm of a chair. Pt has pain with inspiration and movement. This happened around 1400

## 2016-04-21 ENCOUNTER — Telehealth: Payer: Self-pay

## 2016-04-21 ENCOUNTER — Ambulatory Visit (INDEPENDENT_AMBULATORY_CARE_PROVIDER_SITE_OTHER): Payer: Medicare Other | Admitting: Pediatrics

## 2016-04-21 VITALS — BP 143/72 | HR 81 | Temp 97.0°F | Ht 67.0 in | Wt 185.6 lb

## 2016-04-21 DIAGNOSIS — M25552 Pain in left hip: Secondary | ICD-10-CM | POA: Diagnosis not present

## 2016-04-21 DIAGNOSIS — M25579 Pain in unspecified ankle and joints of unspecified foot: Secondary | ICD-10-CM

## 2016-04-21 DIAGNOSIS — E1165 Type 2 diabetes mellitus with hyperglycemia: Secondary | ICD-10-CM

## 2016-04-21 DIAGNOSIS — E875 Hyperkalemia: Secondary | ICD-10-CM | POA: Diagnosis not present

## 2016-04-21 DIAGNOSIS — E1122 Type 2 diabetes mellitus with diabetic chronic kidney disease: Secondary | ICD-10-CM | POA: Diagnosis not present

## 2016-04-21 DIAGNOSIS — K219 Gastro-esophageal reflux disease without esophagitis: Secondary | ICD-10-CM | POA: Diagnosis not present

## 2016-04-21 DIAGNOSIS — E785 Hyperlipidemia, unspecified: Secondary | ICD-10-CM

## 2016-04-21 DIAGNOSIS — G629 Polyneuropathy, unspecified: Secondary | ICD-10-CM

## 2016-04-21 DIAGNOSIS — R609 Edema, unspecified: Secondary | ICD-10-CM

## 2016-04-21 DIAGNOSIS — Z23 Encounter for immunization: Secondary | ICD-10-CM

## 2016-04-21 DIAGNOSIS — M109 Gout, unspecified: Secondary | ICD-10-CM

## 2016-04-21 DIAGNOSIS — I10 Essential (primary) hypertension: Secondary | ICD-10-CM | POA: Diagnosis not present

## 2016-04-21 DIAGNOSIS — Z1159 Encounter for screening for other viral diseases: Secondary | ICD-10-CM

## 2016-04-21 DIAGNOSIS — E1140 Type 2 diabetes mellitus with diabetic neuropathy, unspecified: Secondary | ICD-10-CM

## 2016-04-21 DIAGNOSIS — IMO0002 Reserved for concepts with insufficient information to code with codable children: Secondary | ICD-10-CM

## 2016-04-21 LAB — URINALYSIS
BILIRUBIN UA: NEGATIVE
GLUCOSE, UA: NEGATIVE
KETONES UA: NEGATIVE
Nitrite, UA: NEGATIVE
Specific Gravity, UA: 1.015 (ref 1.005–1.030)
UUROB: 0.2 mg/dL (ref 0.2–1.0)
pH, UA: 5.5 (ref 5.0–7.5)

## 2016-04-21 MED ORDER — ATORVASTATIN CALCIUM 80 MG PO TABS: 80.0000 mg | ORAL_TABLET | Freq: Every day | ORAL | 1 refills | Status: DC

## 2016-04-21 MED ORDER — METOPROLOL SUCCINATE ER 50 MG PO TB24: 100.0000 mg | ORAL_TABLET | Freq: Every day | ORAL | 1 refills | Status: AC

## 2016-04-21 NOTE — Patient Instructions (Signed)
Capsaicin cream for feet [place] Appointment: [phone]

## 2016-04-21 NOTE — Progress Notes (Signed)
Subjective:   Patient ID: [name], female    DOB: Sep 13, 1950, 66 y.o.   MRN: [id number] CC: Follow-up (4 week )  HPI: [name] is a 66 y.o. female presenting for Follow-up (4 week )  Feeling well Slight swelling L ankle by night time Taking torsemide daily    Fell over dog 1 week ago, seen in ED, no rib fractures Ribs only hurt now with coughing, pain improving  Has been taking hydrocodone at night Mostly in the evening in her feet Continues to take at night Taking once a day  No GER symptoms as long as on PPI, does have symptoms without it  DM2: AM BGLs 90s-140s On oral medications Had eye exam in [place] 6 mo ago, due again over the summer  HTN:  Breathing has been fine, no SOB No chest pain  Taking meds regularly  CKD: has appt with nephrology tomorrow Stopped potassium supplement after last appt and elevated K  Neuropathy: does have burning in her feet, gabapentin helps some  Relevant past medical, surgical, family and social history reviewed. Allergies and medications reviewed and updated. History  Smoking Status  . Never Smoker  Smokeless Tobacco  . Never Used   ROS: Per HPI   Objective:    BP (!) 143/72   Pulse 81   Temp 97 F (36.1 C) (Oral)   Ht 5' 7"  (1.702 m)   Wt 185 lb 9.6 oz (84.2 kg)   BMI 29.07 kg/m   Wt Readings from Last 3 Encounters:  04/21/16 185 lb 9.6 oz (84.2 kg)  04/18/16 179 lb (81.2 kg)  04/11/16 182 lb (82.6 kg)    Gen: NAD, alert, cooperative with exam, NCAT EYES: EOMI, no conjunctival injection, or no icterus ENT: OP without erythema LYMPH: no cervical LAD CV: NRRR, normal S1/S2, no murmur, distal pulses 2+ b/l Resp: CTABL, no wheezes, normal WOB Abd: +BS, soft, NTND. Superficial several cm soft mobile mass under the skin LUQ Ext: trace pitting edema at the ankles b/l, warm Neuro: Alert and oriented, decreased sensation to light touch b/l, no sensation with monofilament b/l feet MSK: normal muscle bulk Skin:  thickened toenails throughout, no breaks in skin on feet, no redness around toes, some TTP around R foot great toe  Assessment & Plan:  [name] was seen today for follow-up multiple med problems  Diagnoses and all orders for this visit:  Need for hepatitis C screening test -     Hepatitis C antibody  Hyperlipidemia, unspecified hyperlipidemia type Stable, cont below -     atorvastatin (LIPITOR) 80 MG tablet; Take 1 tablet (80 mg total) by mouth daily at 6 PM.  Pain of left hip joint Cont chronic narcotic therapy, takes at night Due for next pain appt in 2 months  Hyperkalemia Now off K supplement, repeat lab -     BMP8+EGFR  Type 2 diabetes mellitus with chronic kidney disease, without long-term current use of insulin, unspecified CKD stage (HCC) DM type 2, uncontrolled, with neuropathy ([place]) Gross proteinuria 2 years ago once, send for UPC if still positive Cont glimeperide, metformin  Cont to follow kidney function and assess need to change meds Not able to afford tradjenta Due for eye exam over the summer Urinalysis  Swelling Cont torsemide, use compression hose during day  Essential hypertension Take metorpolol 158m daily, amlodipine 149mdaily Cont to follow at home  Gastroesophageal reflux disease without esophagitis Cont ppi, symp stable  Neuropathy (HCC) Cont gabapentin  Gout  of knee, unspecified cause, unspecified chronicity, unspecified laterality Stable, no recent flare, cont allopurinol  Pain in joint involving ankle and foot, unspecified laterality Pain around thickened toenails, digging into skin though no redness or sign of infection -     Ambulatory referral to Podiatry  Follow up plan: Return in about 2 months (around 06/19/2016).  Needs A1c, pain med refills Assunta Found, MD [place]

## 2016-04-21 NOTE — Telephone Encounter (Signed)
Called and LVM for patient to call back regarding results of ultrasound, per Dr.Gherghe staff message to contact her as she could not get in touch with her.

## 2016-04-21 NOTE — Telephone Encounter (Signed)
-----   Message from Philemon Kingdom, MD sent at 04/18/2016  1:15 PM EST ----- [name], I tried to call patient but could not get in touch with her. Can you please call and let her know that I received the result of her thyroid ultrasound and biopsy from the [facility]. She had a right large thyroid nodule on the ultrasound on 10/16/2015. This was biopsied and was benign. At this point, there is no medical indication for surgery, however, at last visit, she was telling me that she has neck compression symptoms. In this case, I can refer her to surgery for right thyroidectomy, if she prefers. We can also continue to follow this if she is comfortable with this plan. Ty, C

## 2016-04-21 NOTE — Addendum Note (Signed)
Addended by: Philemon Kingdom on: 04/21/2016 07:05 PM   Modules accepted: Orders

## 2016-04-22 LAB — BMP8+EGFR
BUN/Creatinine Ratio: 14 (ref 12–28)
BUN: 23 mg/dL (ref 8–27)
CALCIUM: 9.8 mg/dL (ref 8.7–10.3)
CHLORIDE: 95 mmol/L — AB (ref 96–106)
CO2: 30 mmol/L — AB (ref 18–29)
Creatinine, Ser: 1.65 mg/dL — ABNORMAL HIGH (ref 0.57–1.00)
GFR calc Af Amer: 37 mL/min/{1.73_m2} — ABNORMAL LOW (ref >59)
GFR calc non Af Amer: 32 mL/min/{1.73_m2} — ABNORMAL LOW (ref >59)
GLUCOSE: 125 mg/dL — AB (ref 65–99)
Potassium: 4.5 mmol/L (ref 3.5–5.2)
Sodium: 141 mmol/L (ref 134–144)

## 2016-04-22 LAB — HEPATITIS C ANTIBODY

## 2016-04-23 ENCOUNTER — Encounter: Payer: Self-pay | Admitting: Family Medicine

## 2016-04-23 ENCOUNTER — Ambulatory Visit (INDEPENDENT_AMBULATORY_CARE_PROVIDER_SITE_OTHER): Payer: Medicare Other | Admitting: Family Medicine

## 2016-04-23 VITALS — BP 125/69 | HR 72 | Temp 98.3°F | Ht 67.0 in | Wt 183.0 lb

## 2016-04-23 DIAGNOSIS — B9789 Other viral agents as the cause of diseases classified elsewhere: Secondary | ICD-10-CM | POA: Diagnosis not present

## 2016-04-23 DIAGNOSIS — J988 Other specified respiratory disorders: Secondary | ICD-10-CM | POA: Diagnosis not present

## 2016-04-23 NOTE — Patient Instructions (Addendum)
Great to see you!  Please come back if things are not getting better or you have new concerning symptoms.    Viral Respiratory Infection A respiratory infection is an illness that affects part of the respiratory system, such as the lungs, nose, or throat. Most respiratory infections are caused by either viruses or bacteria. A respiratory infection that is caused by a virus is called a viral respiratory infection. Common types of viral respiratory infections include:  A cold.  The flu (influenza).  A respiratory syncytial virus (RSV) infection. How do I know if I have a viral respiratory infection? Most viral respiratory infections cause:  A stuffy or runny nose.  Yellow or green nasal discharge.  A cough.  Sneezing.  Fatigue.  Achy muscles.  A sore throat.  Sweating or chills.  A fever.  A headache. How are viral respiratory infections treated? If influenza is diagnosed early, it may be treated with an antiviral medicine that shortens the length of time a person has symptoms. Symptoms of viral respiratory infections may be treated with over-the-counter and prescription medicines, such as:  Expectorants. These make it easier to cough up mucus.  Decongestant nasal sprays. Health care providers do not prescribe antibiotic medicines for viral infections. This is because antibiotics are designed to kill bacteria. They have no effect on viruses. How do I know if I should stay home from work or school? To avoid exposing others to your respiratory infection, stay home if you have:  A fever.  A persistent cough.  A sore throat.  A runny nose.  Sneezing.  Muscles aches.  Headaches.  Fatigue.  Weakness.  Chills.  Sweating.  Nausea. Follow these instructions at home:  Rest as much as possible.  Take over-the-counter and prescription medicines only as told by your health care provider.  Drink enough fluid to keep your urine clear or pale yellow. This  helps prevent dehydration and helps loosen up mucus.  Gargle with a salt-water mixture 3-4 times per day or as needed. To make a salt-water mixture, completely dissolve -1 tsp of salt in 1 cup of warm water.  Use nose drops made from salt water to ease congestion and soften raw skin around your nose.  Do not drink alcohol.  Do not use tobacco products, including cigarettes, chewing tobacco, and e-cigarettes. If you need help quitting, ask your health care provider. Contact a health care provider if:  Your symptoms last for 10 days or longer.  Your symptoms get worse over time.  You have a fever.  You have severe sinus pain in your face or forehead.  The glands in your jaw or neck become very swollen. Get help right away if:  You feel pain or pressure in your chest.  You have shortness of breath.  You faint or feel like you will faint.  You have severe and persistent vomiting.  You feel confused or disoriented. This information is not intended to replace advice given to you by your health care provider. Make sure you discuss any questions you have with your health care provider. Document Released: 12/18/2004 Document Revised: 08/16/2015 Document Reviewed: 08/16/2014 Elsevier Interactive Patient Education  2017 [employer].

## 2016-04-23 NOTE — Progress Notes (Signed)
   HPI  Patient presents today here with cough and cold symptoms  Patient has 2 days of sneezing, cough, runny nose, headache, itchy eyes. She denies fever, chills, sweats, or new body aches.  She states that she has body aches routinely so it's hard to tell 15 a difference.  She denies any shortness of breath.  PMH: Smoking status noted ROS: Per HPI  Objective: BP 125/69   Pulse 72   Temp 98.3 F (36.8 C) (Oral)   Ht 5\' 7"  (1.702 m)   Wt 183 lb (83 kg)   BMI 28.66 kg/m  Gen: NAD, alert, cooperative with exam HEENT: NCAT, moist oral mucosa with no uvula present, TMs normal bilaterally, nares clear CV: RRR, good S1/S2, no murmur Resp: CTABL, no wheezes, non-labored Ext: No edema, warm Neuro: Alert and oriented, No gross deficits  Assessment and plan:  # Viral respiratory illness Unlikely influenza given the lack of fever, chills, sweats, body aches Most likely, and cold, recommended supportive care and low threshold for return if symptoms do not improve. Offered chest x-ray, patient declines Patient has multiple comorbidities including NSTEMI, CKD, t2dM- maintain very low threshold for eval and Tx for CAP.     Laroy Apple, MD [facility] 04/23/2016, 3:47 PM

## 2016-04-28 ENCOUNTER — Telehealth: Payer: Self-pay | Admitting: Pediatrics

## 2016-04-29 NOTE — Telephone Encounter (Signed)
Noted. Going to send for records.

## 2016-05-07 ENCOUNTER — Ambulatory Visit: Payer: Self-pay | Admitting: Surgery

## 2016-05-26 NOTE — Patient Instructions (Signed)
[name]  05/26/2016   Your procedure is scheduled on: 05/29/16  Report to [facility] Main  Entrance take [place]  elevators to 3rd floor to  [place] at    0730 AM.  Call this number if you have problems the morning of surgery [phone]   Remember: ONLY 1 PERSON MAY GO WITH YOU TO SHORT STAY TO GET  READY MORNING OF [place].  Do not eat food or drink liquids :After Midnight.     Take these medicines the morning of surgery with A SIP OF WATER: allopurinol, amlodipine, citalopram(celexa), gabapentin, metoprolol, omeprazole              DO NOT TAKE ANY DIABETIC MEDICATIONS DAY OF YOUR SURGERY                               You may not have any metal on your body including hair pins and              piercings  Do not wear jewelry, make-up, lotions, powders or perfumes, deodorant             Do not wear nail polish.  Do not shave  48 hours prior to surgery.                Do not bring valuables to the hospital. Cash.  Contacts, dentures or bridgework may not be worn into surgery.  Leave suitcase in the car. After surgery it may be brought to your room.                  Please read over the following fact sheets you were given: _____________________________________________________________________             [facility] - Preparing for Surgery Before surgery, you can play an important role.  Because skin is not sterile, your skin needs to be as free of germs as possible.  You can reduce the number of germs on your skin by washing with CHG (chlorahexidine gluconate) soap before surgery.  CHG is an antiseptic cleaner which kills germs and bonds with the skin to continue killing germs even after washing. Please DO NOT use if you have an allergy to CHG or antibacterial soaps.  If your skin becomes reddened/irritated stop using the CHG and inform your nurse when you arrive at Short Stay. Do not  shave (including legs and underarms) for at least 48 hours prior to the first CHG shower.  You may shave your face/neck. Please follow these instructions carefully:  1.  Shower with CHG Soap the night before surgery and the  morning of Surgery.  2.  If you choose to wash your hair, wash your hair first as usual with your  normal  shampoo.  3.  After you shampoo, rinse your hair and body thoroughly to remove the  shampoo.                           4.  Use CHG as you would any other liquid soap.  You can apply chg directly  to the skin and wash  Gently with a scrungie or clean washcloth.  5.  Apply the CHG Soap to your body ONLY FROM THE NECK DOWN.   Do not use on face/ open                           Wound or open sores. Avoid contact with eyes, ears mouth and genitals (private parts).                       Wash face,  Genitals (private parts) with your normal soap.             6.  Wash thoroughly, paying special attention to the area where your surgery  will be performed.  7.  Thoroughly rinse your body with warm water from the neck down.  8.  DO NOT shower/wash with your normal soap after using and rinsing off  the CHG Soap.                9.  Pat yourself dry with a clean towel.            10.  Wear clean pajamas.            11.  Place clean sheets on your bed the night of your first shower and do not  sleep with pets. Day of Surgery : Do not apply any lotions/deodorants the morning of surgery.  Please wear clean clothes to the hospital/surgery center.  FAILURE TO FOLLOW THESE INSTRUCTIONS MAY RESULT IN THE CANCELLATION OF YOUR SURGERY PATIENT SIGNATURE_________________________________  NURSE SIGNATURE__________________________________  ________________________________________________________________________

## 2016-05-27 ENCOUNTER — Ambulatory Visit (HOSPITAL_COMMUNITY)
Admission: RE | Admit: 2016-05-27 | Discharge: 2016-05-27 | Disposition: A | Discharge status: Home / Self Care | Payer: Medicare Other | Source: Ambulatory Visit | Attending: Anesthesiology | Admitting: Anesthesiology

## 2016-05-27 ENCOUNTER — Encounter (HOSPITAL_COMMUNITY): Payer: Self-pay

## 2016-05-27 ENCOUNTER — Encounter (HOSPITAL_COMMUNITY)
Admission: RE | Admit: 2016-05-27 | Discharge: 2016-05-27 | Disposition: A | Discharge status: Home / Self Care | Payer: Medicare Other | Source: Ambulatory Visit | Attending: Surgery | Admitting: Surgery

## 2016-05-27 DIAGNOSIS — Z01812 Encounter for preprocedural laboratory examination: Secondary | ICD-10-CM | POA: Insufficient documentation

## 2016-05-27 DIAGNOSIS — Z01818 Encounter for other preprocedural examination: Secondary | ICD-10-CM | POA: Diagnosis present

## 2016-05-27 HISTORY — DX: Myoneural disorder, unspecified: G70.9

## 2016-05-27 HISTORY — DX: Pneumonia, unspecified organism: J18.9

## 2016-05-27 LAB — GLUCOSE, CAPILLARY: Glucose-Capillary: 85 mg/dL (ref 65–99)

## 2016-05-27 LAB — BASIC METABOLIC PANEL
ANION GAP: 9 (ref 5–15)
BUN: 48 mg/dL — ABNORMAL HIGH (ref 6–20)
CHLORIDE: 105 mmol/L (ref 101–111)
CO2: 28 mmol/L (ref 22–32)
CREATININE: 2.19 mg/dL — AB (ref 0.44–1.00)
Calcium: 9.4 mg/dL (ref 8.9–10.3)
GFR calc non Af Amer: 22 mL/min — ABNORMAL LOW (ref >60)
GFR, EST AFRICAN AMERICAN: 26 mL/min — AB (ref >60)
GLUCOSE: 80 mg/dL (ref 65–99)
Potassium: 3.8 mmol/L (ref 3.5–5.1)
Sodium: 142 mmol/L (ref 135–145)

## 2016-05-27 LAB — CBC
HCT: 35 % — ABNORMAL LOW (ref 36.0–46.0)
Hemoglobin: 11.3 g/dL — ABNORMAL LOW (ref 12.0–15.0)
MCH: 27 pg (ref 26.0–34.0)
MCHC: 32.3 g/dL (ref 30.0–36.0)
MCV: 83.7 fL (ref 78.0–100.0)
Platelets: 338 10*3/uL (ref 150–400)
RBC: 4.18 MIL/uL (ref 3.87–5.11)
RDW: 14.7 % (ref 11.5–15.5)
WBC: 8.9 10*3/uL (ref 4.0–10.5)

## 2016-05-27 NOTE — Progress Notes (Signed)
BMP roouted to Dr. Harlow Asa via epic done 05/27/16

## 2016-05-27 NOTE — Progress Notes (Signed)
ekg 03/21/16 Stress test 07/03/14 Echo 06/30/14  all in epic

## 2016-05-28 ENCOUNTER — Encounter (HOSPITAL_COMMUNITY): Payer: Self-pay | Admitting: Surgery

## 2016-05-28 DIAGNOSIS — D440 Neoplasm of uncertain behavior of thyroid gland: Secondary | ICD-10-CM | POA: Diagnosis present

## 2016-05-28 NOTE — H&P (Signed)
[facility]  [name] DOB: 07-15-1950 Widowed / Language: English / Race: White Female  History of Present Illness  The patient is a 66 year old female who presents with a thyroid nodule.  Patient is referred by Dr. 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 Patient states that thyroid nodule was initially diagnosed on an MRI scan of the neck and spine performed in [place]. The patient subsequently underwent ultrasound in July 2017 identifying a 3.7 cm nodule in the right thyroid lobe. Left lobe and isthmus were grossly normal. Fine-needle aspiration biopsy was performed which showed Hurthle cell changes but was read as a benign nodule. I do not have the actual reports to review. Patient has developed some mild compressive symptoms including intermittent dysphagia and some changes in the quality of her voice. She notes frequent hoarseness. She would like to have the nodule removed. She presents today for surgical assessment. Patient has hypothyroidism and takes levothyroxine 125 g daily. She has had no prior head or neck surgery. There is no family history of thyroid disease or family history of thyroid malignancy. Patient denies tremors or palpitations.   Past Surgical History  Cataract Surgery  Bilateral. Colon Polyp Removal - Colonoscopy  Foot Surgery  Bilateral. Hip Surgery  Right. Hysterectomy (not due to cancer) - Partial  Shoulder Surgery  Bilateral. Spinal Surgery - Lower Back   Diagnostic Studies History  Colonoscopy  5-10 years ago Mammogram  1-3 years ago Pap Smear  1-5 years ago  Allergies  Latex  Swelling, Shortness of breath. Vancocin HCl *ANTI-INFECTIVE AGENTS - MISC.*  Adhesive Tape  Quinapril HCl *ANTIHYPERTENSIVES*   Medication History Zyloprim (100MG  Tablet, Oral) Active. Norvasc (10MG  Tablet, Oral) Active. Aspirin (81MG  Tablet DR, Oral)  Active. Lipitor (80MG  Tablet, Oral) Active. CeleXA (40MG  Tablet, Oral) Active. Neurontin (800MG  Tablet, Oral) Active. Amaryl (1MG  Tablet, Oral) Active. Norco (10-325MG  Tablet, Oral) Active. Synthroid (125MCG Tablet, Oral) Active. MetFORMIN HCl (500MG  Tablet, Oral) Active. Metoprolol Tartrate (50MG  Tablet, Oral) Active. PriLOSEC (40MG  Capsule DR, Oral) Active. Requip (1MG  Tablet, Oral) Active. Torsemide (20MG  Tablet, Oral) Active. TraZODone HCl (50MG  Tablet, Oral) Active. Medications Reconciled  Social History Alcohol use  Occasional alcohol use. No caffeine use  No drug use  Tobacco use  Never smoker.  Family History Alcohol Abuse  Father. Arthritis  Sister. Colon Cancer  Father. Depression  Daughter. Diabetes Mellitus  Father. Respiratory Condition  Mother.  Pregnancy / Birth History  Age at menarche  35 years. Age of menopause  74-50 Maternal age  29-20 Para  3  Other Problems Anxiety Disorder  Arthritis  Asthma  Chronic Renal Failure Syndrome  Depression  Diabetes Mellitus  Gastroesophageal Reflux Disease  High blood pressure  Hypercholesterolemia  Thyroid Disease   Review of Systems  General Not Present- Appetite Loss, Chills, Fatigue, Fever, Night Sweats, Weight Gain and Weight Loss. Skin Not Present- Change in Wart/Mole, Dryness, Hives, Jaundice, New Lesions, Non-Healing Wounds, Rash and Ulcer. HEENT Present- Hearing Loss, Hoarseness, Ringing in the Ears and Wears glasses/contact lenses. Not Present- Earache, Nose Bleed, Oral Ulcers, Seasonal Allergies, Sinus Pain, Sore Throat, Visual Disturbances and Yellow Eyes. Respiratory Not Present- Bloody sputum, Chronic Cough, Difficulty Breathing, Snoring and Wheezing. Breast Not Present- Breast Mass, Breast Pain, Nipple Discharge and Skin Changes. Cardiovascular Not Present- Chest Pain, Difficulty Breathing Lying Down, Leg Cramps, Palpitations, Rapid Heart Rate, Shortness of  Breath and Swelling of Extremities. Gastrointestinal Not Present- Abdominal Pain, Bloating,  Bloody Stool, Change in Bowel Habits, Chronic diarrhea, Constipation, Difficulty Swallowing, Excessive gas, Gets full quickly at meals, Hemorrhoids, Indigestion, Nausea, Rectal Pain and Vomiting. Female Genitourinary Not Present- Frequency, Nocturia, Painful Urination, Pelvic Pain and Urgency. Musculoskeletal Not Present- Back Pain, Joint Pain, Joint Stiffness, Muscle Pain, Muscle Weakness and Swelling of Extremities. Neurological Not Present- Decreased Memory, Fainting, Headaches, Numbness, Seizures, Tingling, Tremor, Trouble walking and Weakness. Psychiatric Present- Anxiety and Depression. Not Present- Bipolar, Change in Sleep Pattern, Fearful and Frequent crying. Endocrine Not Present- Cold Intolerance, Excessive Hunger, Hair Changes, Heat Intolerance, Hot flashes and New Diabetes. Hematology Not Present- Blood Thinners, Easy Bruising, Excessive bleeding, Gland problems, HIV and Persistent Infections.  Vitals  Weight: 182 lb Height: 67in Body Surface Area: 1.94 m Body Mass Index: 28.5 kg/m  Temp.: 32F(Oral)  Pulse: 67 (Regular)  BP: 122/72 (Sitting, Left Arm, Standard)   Physical Exam   The physical exam findings are as follows: Note:General - appears comfortable, no distress; not diaphorectic  HEENT - normocephalic; sclerae clear, gaze conjugate; mucous membranes moist, dentition good; voice normal  Neck - asymmetric on extension; no palpable anterior or posterior cervical adenopathy; dominant nodule mid right thyroid lobe, approximately 3 cm in size, smooth, mobile, nontender; left thyroid lobe without palpable abnormality  Chest - clear bilaterally without rhonchi, rales, or wheeze  Cor - regular rhythm with normal rate; no significant murmur  Ext - non-tender without significant edema or lymphedema  Neuro - grossly intact; no tremor   Assessment & Plan  NEOPLASM OF  UNCERTAIN BEHAVIOR OF THYROID GLAND (D44.0)  Pt Education - Pamphlet Given - The Thyroid Book: discussed with patient and provided information.  Patient is referred with a dominant right sided thyroid nodule with some abnormal cytopathology from an outside biopsy. Written literature regarding thyroid surgery is provided to the patient to review at home.  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 I have recommended proceeding with right thyroid lobectomy for definitive diagnosis and for relief of mild compressive symptoms. We discussed the risk and benefits of the procedure including the potential for recurrent laryngeal nerve injury and injury to parathyroid glands. We discussed the location of the surgical incision. We discussed the hospital stay to be anticipated. We discussed potential need for changes in her thyroid hormone replacement dosage. She understands and wishes to proceed with surgery in the near future.  The risks and benefits of the procedure have been discussed at length with the patient. The patient understands the proposed procedure, potential alternative treatments, and the course of recovery to be expected. All of the patient's questions have been answered at this time. The patient wishes to proceed with surgery.  Earnstine Regal, MD, [facility] Office: [phone]

## 2016-05-29 ENCOUNTER — Encounter (HOSPITAL_COMMUNITY): Payer: Self-pay | Admitting: *Deleted

## 2016-05-29 ENCOUNTER — Ambulatory Visit (HOSPITAL_COMMUNITY): Payer: Medicare Other | Admitting: Anesthesiology

## 2016-05-29 ENCOUNTER — Observation Stay (HOSPITAL_COMMUNITY)
Admission: RE | Admit: 2016-05-29 | Discharge: 2016-05-30 | Disposition: A | Discharge status: Home / Self Care | Payer: Medicare Other | Source: Ambulatory Visit | Attending: Surgery | Admitting: Surgery

## 2016-05-29 ENCOUNTER — Encounter (HOSPITAL_COMMUNITY)
Admission: RE | Disposition: A | Discharge status: Home / Self Care | Payer: Self-pay | Source: Ambulatory Visit | Attending: Surgery

## 2016-05-29 DIAGNOSIS — F419 Anxiety disorder, unspecified: Secondary | ICD-10-CM | POA: Diagnosis not present

## 2016-05-29 DIAGNOSIS — Z79899 Other long term (current) drug therapy: Secondary | ICD-10-CM | POA: Diagnosis not present

## 2016-05-29 DIAGNOSIS — I252 Old myocardial infarction: Secondary | ICD-10-CM | POA: Diagnosis not present

## 2016-05-29 DIAGNOSIS — Z7982 Long term (current) use of aspirin: Secondary | ICD-10-CM | POA: Diagnosis not present

## 2016-05-29 DIAGNOSIS — E041 Nontoxic single thyroid nodule: Secondary | ICD-10-CM | POA: Diagnosis not present

## 2016-05-29 DIAGNOSIS — E119 Type 2 diabetes mellitus without complications: Secondary | ICD-10-CM | POA: Insufficient documentation

## 2016-05-29 DIAGNOSIS — J449 Chronic obstructive pulmonary disease, unspecified: Secondary | ICD-10-CM | POA: Diagnosis not present

## 2016-05-29 DIAGNOSIS — I25119 Atherosclerotic heart disease of native coronary artery with unspecified angina pectoris: Secondary | ICD-10-CM | POA: Diagnosis not present

## 2016-05-29 DIAGNOSIS — E669 Obesity, unspecified: Secondary | ICD-10-CM | POA: Insufficient documentation

## 2016-05-29 DIAGNOSIS — E7800 Pure hypercholesterolemia, unspecified: Secondary | ICD-10-CM | POA: Diagnosis not present

## 2016-05-29 DIAGNOSIS — Z7984 Long term (current) use of oral hypoglycemic drugs: Secondary | ICD-10-CM | POA: Diagnosis not present

## 2016-05-29 DIAGNOSIS — D440 Neoplasm of uncertain behavior of thyroid gland: Secondary | ICD-10-CM | POA: Diagnosis present

## 2016-05-29 DIAGNOSIS — I10 Essential (primary) hypertension: Secondary | ICD-10-CM | POA: Diagnosis not present

## 2016-05-29 DIAGNOSIS — E063 Autoimmune thyroiditis: Secondary | ICD-10-CM | POA: Diagnosis not present

## 2016-05-29 DIAGNOSIS — N189 Chronic kidney disease, unspecified: Secondary | ICD-10-CM | POA: Diagnosis not present

## 2016-05-29 DIAGNOSIS — Z6828 Body mass index (BMI) 28.0-28.9, adult: Secondary | ICD-10-CM | POA: Diagnosis not present

## 2016-05-29 DIAGNOSIS — K219 Gastro-esophageal reflux disease without esophagitis: Secondary | ICD-10-CM | POA: Insufficient documentation

## 2016-05-29 DIAGNOSIS — F329 Major depressive disorder, single episode, unspecified: Secondary | ICD-10-CM | POA: Diagnosis not present

## 2016-05-29 HISTORY — PX: THYROID LOBECTOMY: SHX420

## 2016-05-29 LAB — GLUCOSE, CAPILLARY
GLUCOSE-CAPILLARY: 163 mg/dL — AB (ref 65–99)
GLUCOSE-CAPILLARY: 164 mg/dL — AB (ref 65–99)
GLUCOSE-CAPILLARY: 150 mg/dL — AB (ref 65–99)
Glucose-Capillary: 137 mg/dL — ABNORMAL HIGH (ref 65–99)

## 2016-05-29 SURGERY — LOBECTOMY, THYROID
Anesthesia: General | Laterality: Right

## 2016-05-29 MED ORDER — LIDOCAINE 2% (20 MG/ML) 5 ML SYRINGE
INTRAMUSCULAR | Status: DC | PRN
  Administered 2016-05-29: 100 mg via INTRAVENOUS

## 2016-05-29 MED ORDER — TRAZODONE HCL 50 MG PO TABS
50.0000 mg | ORAL_TABLET | Freq: Every day | ORAL | Status: DC
  Administered 2016-05-29: 50 mg via ORAL
  Filled 2016-05-29: qty 1

## 2016-05-29 MED ORDER — SODIUM CHLORIDE 0.9 % IR SOLN
Status: DC | PRN
  Administered 2016-05-29: 1000 mL

## 2016-05-29 MED ORDER — METFORMIN HCL 500 MG PO TABS: 500.0000 mg | ORAL_TABLET | Freq: Two times a day (BID) | ORAL | Status: DC

## 2016-05-29 MED ORDER — OXYCODONE HCL 5 MG PO TABS: 5.0000 mg | ORAL_TABLET | Freq: Once | ORAL | Status: DC | PRN

## 2016-05-29 MED ORDER — GLIMEPIRIDE 1 MG PO TABS
1.0000 mg | ORAL_TABLET | Freq: Every day | ORAL | Status: DC
  Administered 2016-05-30: 09:00:00 1 mg via ORAL
  Filled 2016-05-29: qty 1

## 2016-05-29 MED ORDER — SUCCINYLCHOLINE CHLORIDE 200 MG/10ML IV SOSY
PREFILLED_SYRINGE | INTRAVENOUS | Status: AC
  Filled 2016-05-29: qty 10

## 2016-05-29 MED ORDER — MIDAZOLAM HCL 2 MG/2ML IJ SOLN
INTRAMUSCULAR | Status: AC
  Filled 2016-05-29: qty 2

## 2016-05-29 MED ORDER — FENTANYL CITRATE (PF) 100 MCG/2ML IJ SOLN
INTRAMUSCULAR | Status: AC
  Filled 2016-05-29: qty 2

## 2016-05-29 MED ORDER — GABAPENTIN 400 MG PO CAPS
800.0000 mg | ORAL_CAPSULE | Freq: Every day | ORAL | Status: DC
  Administered 2016-05-29: 22:00:00 800 mg via ORAL
  Filled 2016-05-29: qty 2

## 2016-05-29 MED ORDER — GABAPENTIN 800 MG PO TABS
800.0000 mg | ORAL_TABLET | Freq: Every day | ORAL | Status: DC
  Filled 2016-05-29: qty 1

## 2016-05-29 MED ORDER — HYDROMORPHONE HCL 1 MG/ML IJ SOLN
0.2500 mg | INTRAMUSCULAR | Status: DC | PRN
  Administered 2016-05-29: 0.25 mg via INTRAVENOUS
  Administered 2016-05-29: 0.5 mg via INTRAVENOUS

## 2016-05-29 MED ORDER — POTASSIUM CHLORIDE IN NACL 20-0.45 MEQ/L-% IV SOLN
INTRAVENOUS | Status: DC
  Administered 2016-05-29: 18:00:00 via INTRAVENOUS
  Filled 2016-05-29 (×2): qty 1000

## 2016-05-29 MED ORDER — ONDANSETRON HCL 4 MG/2ML IJ SOLN: 4.0000 mg | Freq: Four times a day (QID) | INTRAMUSCULAR | Status: DC | PRN

## 2016-05-29 MED ORDER — ACETAMINOPHEN 650 MG RE SUPP: 650.0000 mg | Freq: Four times a day (QID) | RECTAL | Status: DC | PRN

## 2016-05-29 MED ORDER — LIDOCAINE 2% (20 MG/ML) 5 ML SYRINGE
INTRAMUSCULAR | Status: AC
  Filled 2016-05-29: qty 5

## 2016-05-29 MED ORDER — ONDANSETRON 4 MG PO TBDP: 4.0000 mg | ORAL_TABLET | Freq: Four times a day (QID) | ORAL | Status: DC | PRN

## 2016-05-29 MED ORDER — NITROGLYCERIN 0.4 MG SL SUBL: 0.4000 mg | SUBLINGUAL_TABLET | SUBLINGUAL | Status: DC | PRN

## 2016-05-29 MED ORDER — ROPINIROLE HCL 1 MG PO TABS
1.0000 mg | ORAL_TABLET | Freq: Every day | ORAL | Status: DC
  Administered 2016-05-29: 22:00:00 1 mg via ORAL
  Filled 2016-05-29: qty 1

## 2016-05-29 MED ORDER — ONDANSETRON HCL 4 MG/2ML IJ SOLN
INTRAMUSCULAR | Status: AC
  Filled 2016-05-29: qty 2

## 2016-05-29 MED ORDER — CHLORHEXIDINE GLUCONATE CLOTH 2 % EX PADS: 6.0000 | MEDICATED_PAD | Freq: Once | CUTANEOUS | Status: DC

## 2016-05-29 MED ORDER — HYDROMORPHONE HCL 1 MG/ML IJ SOLN
1.0000 mg | INTRAMUSCULAR | Status: DC | PRN
  Administered 2016-05-29 – 2016-05-30 (×4): 1 mg via INTRAVENOUS
  Filled 2016-05-29 (×4): qty 1

## 2016-05-29 MED ORDER — ROCURONIUM BROMIDE 10 MG/ML (PF) SYRINGE
PREFILLED_SYRINGE | INTRAVENOUS | Status: DC | PRN
  Administered 2016-05-29: 40 mg via INTRAVENOUS

## 2016-05-29 MED ORDER — ROCURONIUM BROMIDE 50 MG/5ML IV SOSY
PREFILLED_SYRINGE | INTRAVENOUS | Status: AC
  Filled 2016-05-29: qty 5

## 2016-05-29 MED ORDER — HYDROCODONE-ACETAMINOPHEN 5-325 MG PO TABS
1.0000 | ORAL_TABLET | ORAL | Status: DC | PRN
  Administered 2016-05-30: 09:00:00 1 via ORAL
  Administered 2016-05-30: 02:00:00 2 via ORAL
  Filled 2016-05-29: qty 2
  Filled 2016-05-29: qty 1

## 2016-05-29 MED ORDER — SUGAMMADEX SODIUM 200 MG/2ML IV SOLN
INTRAVENOUS | Status: DC | PRN
  Administered 2016-05-29: 200 mg via INTRAVENOUS

## 2016-05-29 MED ORDER — CITALOPRAM HYDROBROMIDE 20 MG PO TABS
40.0000 mg | ORAL_TABLET | Freq: Every day | ORAL | Status: DC
  Administered 2016-05-30: 40 mg via ORAL
  Filled 2016-05-29: qty 2

## 2016-05-29 MED ORDER — PANTOPRAZOLE SODIUM 40 MG PO TBEC
40.0000 mg | DELAYED_RELEASE_TABLET | Freq: Every day | ORAL | Status: DC
  Administered 2016-05-30: 09:00:00 40 mg via ORAL
  Filled 2016-05-29: qty 1

## 2016-05-29 MED ORDER — FENTANYL CITRATE (PF) 100 MCG/2ML IJ SOLN
INTRAMUSCULAR | Status: DC | PRN
  Administered 2016-05-29 (×2): 50 ug via INTRAVENOUS

## 2016-05-29 MED ORDER — LEVOTHYROXINE SODIUM 125 MCG PO TABS
62.5000 ug | ORAL_TABLET | Freq: Every day | ORAL | Status: DC
  Administered 2016-05-30: 09:00:00 62.5 ug via ORAL
  Filled 2016-05-29: qty 0.5

## 2016-05-29 MED ORDER — TORSEMIDE 20 MG PO TABS
40.0000 mg | ORAL_TABLET | Freq: Every day | ORAL | Status: DC
  Administered 2016-05-30: 40 mg via ORAL
  Filled 2016-05-29: qty 2

## 2016-05-29 MED ORDER — AMLODIPINE BESYLATE 10 MG PO TABS
10.0000 mg | ORAL_TABLET | Freq: Every day | ORAL | Status: DC
  Administered 2016-05-30: 10 mg via ORAL
  Filled 2016-05-29: qty 1

## 2016-05-29 MED ORDER — INSULIN ASPART 100 UNIT/ML ~~LOC~~ SOLN: 0.0000 [IU] | Freq: Three times a day (TID) | SUBCUTANEOUS | Status: DC

## 2016-05-29 MED ORDER — PROPOFOL 10 MG/ML IV BOLUS
INTRAVENOUS | Status: AC
  Filled 2016-05-29: qty 20

## 2016-05-29 MED ORDER — ALLOPURINOL 100 MG PO TABS
100.0000 mg | ORAL_TABLET | Freq: Every morning | ORAL | Status: DC
  Administered 2016-05-30: 100 mg via ORAL
  Filled 2016-05-29: qty 1

## 2016-05-29 MED ORDER — OXYCODONE HCL 5 MG/5ML PO SOLN: 5.0000 mg | Freq: Once | ORAL | Status: DC | PRN

## 2016-05-29 MED ORDER — PROMETHAZINE HCL 25 MG/ML IJ SOLN: 6.2500 mg | INTRAMUSCULAR | Status: DC | PRN

## 2016-05-29 MED ORDER — HYDROMORPHONE HCL 2 MG/ML IJ SOLN
INTRAMUSCULAR | Status: AC
  Filled 2016-05-29: qty 1

## 2016-05-29 MED ORDER — METOPROLOL SUCCINATE ER 50 MG PO TB24
100.0000 mg | ORAL_TABLET | Freq: Every day | ORAL | Status: DC
  Administered 2016-05-30: 09:00:00 100 mg via ORAL
  Filled 2016-05-29: qty 2

## 2016-05-29 MED ORDER — LACTATED RINGERS IV SOLN
INTRAVENOUS | Status: DC
  Administered 2016-05-29: 1000 mL via INTRAVENOUS
  Administered 2016-05-29: 08:00:00 via INTRAVENOUS

## 2016-05-29 MED ORDER — CEFAZOLIN SODIUM-DEXTROSE 2-4 GM/100ML-% IV SOLN
2.0000 g | INTRAVENOUS | Status: AC
  Administered 2016-05-29: 2 g via INTRAVENOUS
  Filled 2016-05-29: qty 100

## 2016-05-29 MED ORDER — ACETAMINOPHEN 325 MG PO TABS: 650.0000 mg | ORAL_TABLET | Freq: Four times a day (QID) | ORAL | Status: DC | PRN

## 2016-05-29 MED ORDER — MIDAZOLAM HCL 5 MG/5ML IJ SOLN
INTRAMUSCULAR | Status: DC | PRN
  Administered 2016-05-29: 2 mg via INTRAVENOUS

## 2016-05-29 MED ORDER — ONDANSETRON HCL 4 MG/2ML IJ SOLN
INTRAMUSCULAR | Status: DC | PRN
  Administered 2016-05-29: 4 mg via INTRAVENOUS

## 2016-05-29 MED ORDER — PROPOFOL 10 MG/ML IV BOLUS
INTRAVENOUS | Status: DC | PRN
  Administered 2016-05-29: 150 mg via INTRAVENOUS

## 2016-05-29 SURGICAL SUPPLY — 42 items
ATTRACTOMAT 16X20 MAGNETIC DRP (DRAPES) ×3 IMPLANT
BLADE SURG 15 STRL LF DISP TIS (BLADE) ×1 IMPLANT
BLADE SURG 15 STRL SS (BLADE) ×2
CHLORAPREP W/TINT 26ML (MISCELLANEOUS) ×6 IMPLANT
CLIP TI MEDIUM 6 (CLIP) ×6 IMPLANT
CLIP TI WIDE RED SMALL 6 (CLIP) ×6 IMPLANT
CLOSURE WOUND 1/2 X4 (GAUZE/BANDAGES/DRESSINGS) ×1
COVER SURGICAL LIGHT HANDLE (MISCELLANEOUS) ×3 IMPLANT
DISSECTOR ROUND CHERRY 3/8 STR (MISCELLANEOUS) IMPLANT
DRAPE LAPAROTOMY T 98X78 PEDS (DRAPES) ×3 IMPLANT
ELECT PENCIL ROCKER SW 15FT (MISCELLANEOUS) ×3 IMPLANT
ELECT REM PT RETURN 9FT ADLT (ELECTROSURGICAL) ×3
ELECTRODE REM PT RTRN 9FT ADLT (ELECTROSURGICAL) ×1 IMPLANT
GAUZE SPONGE 4X4 12PLY STRL (GAUZE/BANDAGES/DRESSINGS) ×3 IMPLANT
GAUZE SPONGE 4X4 16PLY XRAY LF (GAUZE/BANDAGES/DRESSINGS) ×3 IMPLANT
GLOVE BIOGEL PI IND STRL 6.5 (GLOVE) ×3 IMPLANT
GLOVE BIOGEL PI IND STRL 7.5 (GLOVE) ×1 IMPLANT
GLOVE BIOGEL PI IND STRL 8.5 (GLOVE) ×1 IMPLANT
GLOVE BIOGEL PI INDICATOR 6.5 (GLOVE) ×6
GLOVE BIOGEL PI INDICATOR 7.5 (GLOVE) ×2
GLOVE BIOGEL PI INDICATOR 8.5 (GLOVE) ×2
GLOVE SURG ORTHO 8.0 STRL STRW (GLOVE) IMPLANT
GOWN STRL REUS W/TWL XL LVL3 (GOWN DISPOSABLE) ×6 IMPLANT
HEMOSTAT SURGICEL 2X4 FIBR (HEMOSTASIS) ×3 IMPLANT
ILLUMINATOR WAVEGUIDE N/F (MISCELLANEOUS) ×3 IMPLANT
KIT BASIN OR (CUSTOM PROCEDURE TRAY) ×3 IMPLANT
LIGHT WAVEGUIDE WIDE FLAT (MISCELLANEOUS) IMPLANT
PACK BASIC VI WITH GOWN DISP (CUSTOM PROCEDURE TRAY) ×3 IMPLANT
SHEARS HARMONIC 9CM CVD (BLADE) ×3 IMPLANT
STAPLER VISISTAT 35W (STAPLE) ×3 IMPLANT
STRIP CLOSURE SKIN 1/2X4 (GAUZE/BANDAGES/DRESSINGS) ×2 IMPLANT
SUT MNCRL AB 4-0 PS2 18 (SUTURE) ×3 IMPLANT
SUT SILK 2 0 (SUTURE) ×2
SUT SILK 2-0 18XBRD TIE 12 (SUTURE) ×1 IMPLANT
SUT SILK 3 0 (SUTURE)
SUT SILK 3-0 18XBRD TIE 12 (SUTURE) IMPLANT
SUT VIC AB 3-0 SH 18 (SUTURE) ×3 IMPLANT
SYR BULB IRRIGATION 50ML (SYRINGE) ×3 IMPLANT
TAPE PAPER 3X10 WHT MICROPORE (GAUZE/BANDAGES/DRESSINGS) ×3 IMPLANT
TOWEL OR 17X26 10 PK STRL BLUE (TOWEL DISPOSABLE) ×3 IMPLANT
TOWEL OR NON WOVEN STRL DISP B (DISPOSABLE) ×3 IMPLANT
YANKAUER SUCT BULB TIP 10FT TU (MISCELLANEOUS) ×3 IMPLANT

## 2016-05-29 NOTE — Op Note (Signed)
Procedure Note  Pre-operative Diagnosis:  Thyroid neoplasm of uncertain behavior  Post-operative Diagnosis:  same  Surgeon:  Earnstine Regal, MD, FACS  Assistant:  none   Procedure:  Right thyroid lobectomy  Anesthesia:  General  Estimated Blood Loss:  minimal  Drains: none         Specimen: thyroid lobe to pathology  Indications:  The patient is a 66 year old female who presents with a thyroid nodule.  Patient is referred by Dr. 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 Patient states that thyroid nodule was initially diagnosed on an MRI scan of the neck and spine performed in [place]. The patient subsequently underwent ultrasound in July 2017 identifying a 3.7 cm nodule in the right thyroid lobe. Left lobe and isthmus were grossly normal. Fine-needle aspiration biopsy was performed which showed Hurthle cell changes but was read as a benign nodule. I do not have the actual reports to review. Patient has developed some mild compressive symptoms including intermittent dysphagia and some changes in the quality of her voice. She notes frequent hoarseness. She would like to have the nodule removed. She presents today for surgical assessment.   Procedure Details: Procedure was done in OR #2 at the [facility].  The patient was brought to the operating room and placed in a supine position on the operating room table.  Following administration of general anesthesia, the patient was positioned and then prepped and draped in the usual aseptic fashion.  After ascertaining that an adequate level of anesthesia had been achieved, a Kocher incision was made with #15 blade.  Dissection was carried through subcutaneous tissues and platysma. Hemostasis was achieved with the electrocautery.  Skin flaps were elevated cephalad and caudad from the thyroid notch to the sternal notch.  A self-retaining retractor was placed for exposure.  Strap muscles were  incised in the midline and dissection was begun on the right side.  Strap muscles were reflected laterally.  The right thyroid lobe was moderately enlarged with a dominant nodule occupying most of the lobe.  The lobe was gently mobilized with blunt dissection.  Superior pole vessels were dissected out and divided individually between small and medium Ligaclips with the Harmonic scalpel.  The thyroid lobe was rolled anteriorly.  Branches of the inferior thyroid artery were divided between small Ligaclips with the Harmonic scalpel.  Inferior venous tributaries were divided between Ligaclips.  Both the superior and inferior parathyroid glands were identified and preserved on their vascular pedicles.  The recurrent laryngeal nerve was identified and preserved along its course.  The ligament of Gwenlyn Found was released with the electrocautery and the gland was mobilized onto the anterior trachea. Isthmus was mobilized across the midline.  There was no pyramidal lobe present.  The thyroid parenchyma was transected at the junction of the isthmus and contralateral thyroid lobe with the Harmonic scalpel.  The thyroid lobe and isthmus were submitted to pathology for review.  The neck was irrigated with warm saline.  Fibular was placed throughout the operative field.  Strap muscles were reapproximated in the midline with interrupted 3-0 Vicryl sutures.  Platysma was closed with interrupted 3-0 Vicryl sutures.  Skin was closed with a running 4-0 Monocryl subcuticular suture.  Wound was washed and dried and steri-strips were applied.  Dry gauze dressing was placed.  The patient was awakened from anesthesia and brought to the recovery room.  The patient tolerated the procedure well.   Earnstine Regal, MD,  [facility] Office: [phone]

## 2016-05-29 NOTE — Interval H&P Note (Signed)
History and Physical Interval Note:  05/29/2016 9:11 AM  [name]  has presented today for surgery, with the diagnosis of Thyroid neoplasm of uncertain behavior  The various methods of treatment have been discussed with the patient and family. After consideration of risks, benefits and other options for treatment, the patient has consented to    Procedure(s): RIGHT THYROID LOBECTOMY (Right) as a surgical intervention .    The patient's history has been reviewed, patient examined, no change in status, stable for surgery.  I have reviewed the patient's chart and labs.  Questions were answered to the patient's satisfaction.    Earnstine Regal, MD, [facility] Office: [place]

## 2016-05-29 NOTE — Anesthesia Preprocedure Evaluation (Addendum)
Anesthesia Evaluation  Patient identified by MRN, date of birth, ID band Patient awake    Reviewed: Allergy & Precautions, H&P , NPO status , Patient's Chart, lab work & pertinent test results  History of Anesthesia Complications (+) PONV  Airway Mallampati: II   Neck ROM: full    Dental  (+) Edentulous Upper, Edentulous Lower   Pulmonary COPD,    Pulmonary exam normal        Cardiovascular hypertension, + angina + CAD and + Past MI  Normal cardiovascular exam     Neuro/Psych  Headaches, Depression    GI/Hepatic hiatal hernia, GERD  ,  Endo/Other  diabetes, Type 2, Oral Hypoglycemic AgentsHypothyroidism obese  Renal/GU Renal InsufficiencyRenal disease     Musculoskeletal  (+) Arthritis ,   Abdominal   Peds  Hematology   Anesthesia Other Findings   Reproductive/Obstetrics                            Anesthesia Physical  Anesthesia Plan  ASA: III  Anesthesia Plan: General   Post-op Pain Management:    Induction: Intravenous  Airway Management Planned: Oral ETT  Additional Equipment:   Intra-op Plan:   Post-operative Plan:   Informed Consent: I have reviewed the patients History and Physical, chart, labs and discussed the procedure including the risks, benefits and alternatives for the proposed anesthesia with the patient or authorized representative who has indicated his/her understanding and acceptance.   Dental advisory given  Plan Discussed with: CRNA and Surgeon  Anesthesia Plan Comments:       Anesthesia Quick Evaluation

## 2016-05-29 NOTE — Anesthesia Postprocedure Evaluation (Signed)
Anesthesia Post Note  Patient: [name]  Procedure(s) Performed: Procedure(s) (LRB): RIGHT THYROID LOBECTOMY (Right)  Patient location during evaluation: PACU Anesthesia Type: General Level of consciousness: awake and alert Pain management: pain level controlled Vital Signs Assessment: post-procedure vital signs reviewed and stable Respiratory status: spontaneous breathing, nonlabored ventilation, respiratory function stable and patient connected to nasal cannula oxygen Cardiovascular status: blood pressure returned to baseline and stable Postop Assessment: no signs of nausea or vomiting Anesthetic complications: no       Last Vitals:  Vitals:   05/29/16 1115 05/29/16 1130  BP: 124/60   Pulse: (!) 59   Resp: 19   Temp:  36.8 C    Last Pain:  Vitals:   05/29/16 1130  TempSrc:   PainSc: 0-No pain                 Lynda Rainwater

## 2016-05-29 NOTE — Progress Notes (Signed)
PHARMACIST - PHYSICIAN COMMUNICATION DR:  Harlow Asa CONCERNING:  METFORMIN SAFE ADMINISTRATION POLICY  RECOMMENDATION: Metformin has been placed on DISCONTINUE (rejected order) STATUS and should be reordered only after any of the conditions below are ruled out.  Current Safety recommendations include avoiding metformin for a minimum of 48 hours after the patient's exposure to intravenous contrast media for the following conditions:  . eGFR < 60 ml/min  . Liver disease, alcoholism, heart failure, intra-arterial administration of contrast  DESCRIPTION:  The Pharmacy Committee has adopted a policy that restricts the use of metformin in hospitalized patients until all the contraindications to administration have been ruled out. Specific contraindications are: []  Serum creatinine ? 1.5 for males [x]  Serum creatinine ? 1.4 for females []  Shock, acute MI, sepsis, hypoxemia, dehydration []  Planned administration of intravenous iodinated contrast media when eGFR < 25m/min, Liver Disease, alcoholism, heart failure or intra-arterial administration of contrast []  Heart Failure patients with low EF []  Acute or chronic metabolic acidosis (including DKA) Contraindicated when GFR < 30 -   MEudelia Bunch Pharm.D. [id number]/10/2016 12:22 PM

## 2016-05-29 NOTE — Anesthesia Procedure Notes (Signed)
Procedure Name: Intubation Date/Time: 05/29/2016 9:21 AM Performed by: Lind Covert Pre-anesthesia Checklist: Patient identified, Emergency Drugs available, Patient being monitored, Suction available and Timeout performed Patient Re-evaluated:Patient Re-evaluated prior to inductionOxygen Delivery Method: Circle system utilized Preoxygenation: Pre-oxygenation with 100% oxygen Intubation Type: IV induction Ventilation: Mask ventilation without difficulty Laryngoscope Size: Mac and 3 Grade View: Grade I Tube size: 7.0 mm Number of attempts: 1 Airway Equipment and Method: Stylet Placement Confirmation: ETT inserted through vocal cords under direct vision,  positive ETCO2 and breath sounds checked- equal and bilateral Secured at: 20 cm Tube secured with: Tape Dental Injury: Teeth and Oropharynx as per pre-operative assessment

## 2016-05-29 NOTE — Transfer of Care (Signed)
Immediate Anesthesia Transfer of Care Note  Patient: [name]  Procedure(s) Performed: Procedure(s): RIGHT THYROID LOBECTOMY (Right)  Patient Location: PACU  Anesthesia Type:General  Level of Consciousness: sedated  Airway & Oxygen Therapy: Patient Spontanous Breathing and Patient connected to face mask oxygen  Post-op Assessment: Report given to RN and Post -op Vital signs reviewed and stable  Post vital signs: Reviewed and stable  Last Vitals:  Vitals:   05/29/16 0727  BP: 133/73  Pulse: 63  Resp: 16  Temp: 36.7 C    Last Pain:  Vitals:   05/29/16 0727  TempSrc: Oral         Complications: No apparent anesthesia complications

## 2016-05-30 ENCOUNTER — Encounter (HOSPITAL_COMMUNITY): Payer: Self-pay | Admitting: Surgery

## 2016-05-30 DIAGNOSIS — E041 Nontoxic single thyroid nodule: Secondary | ICD-10-CM | POA: Diagnosis not present

## 2016-05-30 LAB — GLUCOSE, CAPILLARY: GLUCOSE-CAPILLARY: 105 mg/dL — AB (ref 65–99)

## 2016-05-30 MED ORDER — HYDROCODONE-ACETAMINOPHEN 5-325 MG PO TABS: 1.0000 | ORAL_TABLET | ORAL | 0 refills | Status: DC | PRN

## 2016-05-30 NOTE — Discharge Summary (Signed)
Physician Discharge Summary [facility]  Patient ID: [name] MRN: [id number] DOB/AGE: 09-18-1950 66 y.o.  Admit date: 05/29/2016 Discharge date: 05/30/2016  Admission Diagnoses:  Thyroid neoplasm of uncertain behavior  Discharge Diagnoses:  Principal Problem:   Neoplasm of uncertain behavior of thyroid gland   Discharged Condition: good  Hospital Course: Patient was admitted for observation following thyroid surgery.  Post op course was uncomplicated.  Pain was well controlled.  Tolerated diet.  Patient was prepared for discharge home on POD#1.  Consults: None  Treatments: surgery: thyroid lobectomy  Discharge Exam: Blood pressure (!) 129/57, pulse 63, temperature 98.4 F (36.9 C), temperature source Oral, resp. rate 16, height 5\' 7"  (1.702 m), weight 82.6 kg (182 lb), SpO2 96 %. HEENT - clear Neck - wound dry and intact; mild STS; voice normal Chest - clear bilaterally Cor - RRR  Disposition: Home  Discharge Instructions    Apply dressing    Complete by:  As directed    Apply light gauze dressing to wound before discharge home today.   Diet - low sodium heart healthy    Complete by:  As directed    Discharge instructions    Complete by:  As directed    [facility]  THYROID & PARATHYROID SURGERY:  POST-OP INSTRUCTIONS  Always review your discharge instruction sheet from the facility where your surgery was performed.  A prescription for pain medication may be given to you upon discharge.  Take your pain medication as prescribed.  If narcotic pain medicine is not needed, then you may take acetaminophen (Tylenol) or ibuprofen (Advil) as needed.  Take your usually prescribed medications unless otherwise directed.  If you need a refill on your pain medication, please contact our office during regular business hours.  Prescriptions will not be processed by our office after 5 pm or on weekends.  Start with a light diet upon arrival  home, such as soup and crackers or toast.  Be sure to drink plenty of fluids daily.  Resume your normal diet the day after surgery.  Most patients will experience some swelling and bruising on the chest and neck area.  Ice packs will help.  Swelling and bruising can take several days to resolve.   It is common to experience some constipation after surgery.  Increasing fluid intake and taking a stool softener (Colace) will usually help or prevent this problem.  A mild laxative (Milk of Magnesia or Miralax) should be taken according to package directions if there has been no bowel movement after 48 hours.  You have steri-strips and a gauze dressing over your incision.  You may remove the gauze bandage on the second day after surgery, and you may shower at that time.  Leave your steri-strips (small skin tapes) in place directly over the incision.  These strips should remain on the skin for 5-7 days and then be removed.  You may get them wet in the shower and pat them dry.  You may resume regular (light) daily activities beginning the next day - such as daily self-care, walking, climbing stairs - gradually increasing activities as tolerated.  You may have sexual intercourse when it is comfortable.  Refrain from any heavy lifting or straining until approved by your doctor.  You may drive when you no longer are taking prescription pain medication, you can comfortably wear a seatbelt, and you can safely maneuver your car and apply brakes.  You should see your doctor in the  office for a follow-up appointment approximately three weeks after your surgery.  Make sure that you call for this appointment within a day or two after you arrive home to insure a convenient appointment time.  WHEN TO CALL YOUR DOCTOR: -- Fever greater than 101.5 -- Inability to urinate -- Nausea and/or vomiting - persistent -- Extreme swelling or bruising -- Continued bleeding from incision -- Increased pain, redness, or drainage from  the incision -- Difficulty swallowing or breathing -- Muscle cramping or spasms -- Numbness or tingling in hands or around lips  The clinic staff is available to answer your questions during regular business hours.  Please don't hesitate to call and ask to speak to one of the nurses if you have concerns.  Earnstine Regal, MD, [facility] Office: [phone]  Website: [email or web address]   Increase activity slowly    Complete by:  As directed    Remove dressing in 24 hours    Complete by:  As directed      Allergies as of 05/30/2016      Reactions   Latex Shortness Of Breath, Swelling   Facial swelling   Vancomycin Shortness Of Breath   Vancomycin Hcl Swelling   REACTION: facial swelling   Adhesive [tape] Rash   Plastic tape pulls her skin off when it is removed. Paper tape is ok per patient.   Quinapril Hcl Swelling, Rash   Arm swelling      Medication List    TAKE these medications   allopurinol 100 MG tablet Commonly known as:  ZYLOPRIM Take 1 tablet (100 mg total) by mouth every morning.   amLODipine 10 MG tablet Commonly known as:  NORVASC Take 1 tablet (10 mg total) by mouth daily.   atorvastatin 80 MG tablet Commonly known as:  LIPITOR Take 1 tablet (80 mg total) by mouth daily at 6 PM. What changed:  when to take this   citalopram 40 MG tablet Commonly known as:  CELEXA Take 1 tablet (40 mg total) by mouth daily.   gabapentin 800 MG tablet Commonly known as:  NEURONTIN Take 1 tablet (800 mg total) by mouth at bedtime.   glimepiride 1 MG tablet Commonly known as:  AMARYL Take 1 tablet (1 mg total) by mouth daily with breakfast.   HYDROcodone-acetaminophen 10-325 MG tablet Commonly known as:  NORCO Take 1 tablet by mouth every 8 (eight) hours as needed. What changed:  when to take this   HYDROcodone-acetaminophen 5-325 MG tablet Commonly known as:  NORCO/VICODIN Take 1-2 tablets by  mouth every 4 (four) hours as needed for moderate pain. What changed:  You were already taking a medication with the same name, and this prescription was added. Make sure you understand how and when to take each.   levothyroxine 125 MCG tablet Commonly known as:  SYNTHROID, LEVOTHROID Take 0.5 tablets (62.5 mcg total) by mouth daily before breakfast.   metFORMIN 500 MG tablet Commonly known as:  GLUCOPHAGE Take 1 tablet (500 mg total) by mouth 2 (two) times daily with a meal.   metoprolol succinate 50 MG 24 hr tablet Commonly known as:  TOPROL-XL Take 2 tablets (100 mg total) by mouth daily. Take with or immediately following a meal.   nitroGLYCERIN 0.4 MG SL tablet Commonly known as:  NITROSTAT Place 1 tablet (0.4 mg total) under the tongue every 5 (five) minutes as needed for chest pain.   omeprazole 40 MG capsule Commonly known as:  PRILOSEC Take 1 capsule (40 mg total) by mouth daily.   rOPINIRole 1 MG tablet Commonly known as:  REQUIP Take 1 tablet (1 mg total) by mouth at bedtime.   torsemide 20 MG tablet Commonly known as:  DEMADEX Take 2 tablets (40 mg total) by mouth daily.   traZODone 50 MG tablet Commonly known as:  DESYREL Take 50 mg by mouth at bedtime.      Follow-up Information    Karsten Vaughn M, MD. Schedule an appointment as soon as possible for a visit in 3 week(s).   Specialty:  General Surgery Contact information: [address] [place] [address] [phone]           Earnstine Regal, MD, [facility] Office: [phone]   Signed: Earnstine Regal 05/30/2016, 10:04 AM

## 2016-06-02 NOTE — Progress Notes (Signed)
Please contact patient and notify of benign pathology results.  Leotis Isham M. Pricella Gaugh, MD, FACS [facility] Office: [phone]   

## 2016-06-10 ENCOUNTER — Encounter: Payer: Self-pay | Admitting: Orthopaedic Surgery

## 2016-06-10 ENCOUNTER — Ambulatory Visit: Payer: Medicare Other | Admitting: Orthopaedic Surgery

## 2016-06-19 ENCOUNTER — Encounter: Payer: Self-pay | Admitting: Pediatrics

## 2016-06-19 ENCOUNTER — Ambulatory Visit (INDEPENDENT_AMBULATORY_CARE_PROVIDER_SITE_OTHER): Payer: Medicare Other | Admitting: Pediatrics

## 2016-06-19 VITALS — BP 147/68 | HR 58 | Temp 97.6°F | Ht 67.0 in | Wt 183.6 lb

## 2016-06-19 DIAGNOSIS — M25552 Pain in left hip: Secondary | ICD-10-CM

## 2016-06-19 DIAGNOSIS — I10 Essential (primary) hypertension: Secondary | ICD-10-CM | POA: Diagnosis not present

## 2016-06-19 DIAGNOSIS — E1140 Type 2 diabetes mellitus with diabetic neuropathy, unspecified: Secondary | ICD-10-CM | POA: Diagnosis not present

## 2016-06-19 DIAGNOSIS — K219 Gastro-esophageal reflux disease without esophagitis: Secondary | ICD-10-CM | POA: Diagnosis not present

## 2016-06-19 DIAGNOSIS — E1165 Type 2 diabetes mellitus with hyperglycemia: Secondary | ICD-10-CM

## 2016-06-19 DIAGNOSIS — E039 Hypothyroidism, unspecified: Secondary | ICD-10-CM | POA: Diagnosis not present

## 2016-06-19 DIAGNOSIS — Z0289 Encounter for other administrative examinations: Secondary | ICD-10-CM

## 2016-06-19 DIAGNOSIS — E785 Hyperlipidemia, unspecified: Secondary | ICD-10-CM | POA: Diagnosis not present

## 2016-06-19 DIAGNOSIS — G629 Polyneuropathy, unspecified: Secondary | ICD-10-CM | POA: Diagnosis not present

## 2016-06-19 DIAGNOSIS — IMO0002 Reserved for concepts with insufficient information to code with codable children: Secondary | ICD-10-CM

## 2016-06-19 LAB — BAYER DCA HB A1C WAIVED: HB A1C: 7.9 % — AB (ref <7.0)

## 2016-06-19 MED ORDER — HYDROCODONE-ACETAMINOPHEN 10-325 MG PO TABS: 1.0000 | ORAL_TABLET | Freq: Every evening | ORAL | 0 refills | Status: AC | PRN

## 2016-06-19 MED ORDER — GLIMEPIRIDE 1 MG PO TABS: 1.0000 mg | ORAL_TABLET | Freq: Every day | ORAL | 1 refills | Status: AC

## 2016-06-19 MED ORDER — OMEPRAZOLE 40 MG PO CPDR: 40.0000 mg | DELAYED_RELEASE_CAPSULE | Freq: Every day | ORAL | 1 refills | Status: AC

## 2016-06-19 MED ORDER — AMLODIPINE BESYLATE 10 MG PO TABS: 10.0000 mg | ORAL_TABLET | Freq: Every day | ORAL | 1 refills | Status: AC

## 2016-06-19 MED ORDER — ATORVASTATIN CALCIUM 80 MG PO TABS: 80.0000 mg | ORAL_TABLET | Freq: Every day | ORAL | 1 refills | Status: AC

## 2016-06-19 MED ORDER — GABAPENTIN 800 MG PO TABS: 800.0000 mg | ORAL_TABLET | Freq: Every day | ORAL | 0 refills | Status: AC

## 2016-06-19 NOTE — Patient Instructions (Signed)
Capsacin pepper cream for feet during day and night Wash hands after applying

## 2016-06-19 NOTE — Progress Notes (Signed)
Subjective:   Patient ID: [name], female    DOB: March 10, 1951, 66 y.o.   MRN: [id number] CC: Follow-up (3 month)  HPI: [name] is a 66 y.o. female presenting for Follow-up (3 month)  DM2: 90-130s in the morning Avoiding sugar On metformin, amaryl Due for eye exam over the summer Last eye exam in [place], next due over the summer  HTN: Checks at home 130s/60s-70s Taking meds regularly No CP, no HA  HLD: On statin, taking daily  CKD: Followed by Dr. Malka So stable On allopurinol for gout No recent flares Doesn't miss any doses  GERD: Takes omeprazole daily, has reflux symptoms without it  Depression: takes celexa every day Has been helping with mood Thinks it is helping  RLS: takes requip every night Has been helping Says symptoms much worse without it  Having neuropathy pain in feet Takes gabapentin at night, is not sure if it is helping much Takes hydrocodone 13m at night for pain in her feet Does not feel able to sleep without it taking the edge off of her pain No recent change to dose Was also prescribed hydrocodone 522mfollowing thyroid surgery No further pain in neck   Relevant past medical, surgical, family and social history reviewed. Allergies and medications reviewed and updated. History  Smoking Status  . Never Smoker  Smokeless Tobacco  . Never Used   ROS: Per HPI   Objective:    BP (!) 147/68   Pulse (!) 58   Temp 97.6 F (36.4 C) (Oral)   Ht 5' 7"  (1.702 m)   Wt 183 lb 9.6 oz (83.3 kg)   BMI 28.76 kg/m   Wt Readings from Last 3 Encounters:  06/19/16 183 lb 9.6 oz (83.3 kg)  05/29/16 182 lb (82.6 kg)  05/27/16 182 lb (82.6 kg)    Gen: NAD, alert, cooperative with exam, NCAT EYES: EOMI, no conjunctival injection, or no icterus ENT:  TMs pearly gray b/l, OP without erythema LYMPH: no cervical LAD CV: NRRR, normal S1/S2, no murmur, distal pulses 2+ b/l Resp: CTABL, no wheezes, normal WOB Abd: +BS, soft, NTND. no guarding  or organomegaly Ext: No edema, warm Neuro: Alert and oriented, strength equal b/l UE and LE, coordination grossly normal  Assessment & Plan:  [name] seen today for follow-up.  Diagnoses and all orders for this visit:  Hyperlipidemia, unspecified hyperlipidemia type Stable onnt statin -     atorvastatin (LIPITOR) 80 MG tablet; Take 1 tablet (80 mg total) by mouth daily.  DM type 2, uncontrolled, with neuropathy (HCC) A1c down to 7.9 Cont to avoid sugary foods Increase activity level Cont to check at home Cont meds -     Bayer DCA Hb A1c Waived -     CMP14+EGFR -     glimepiride (AMARYL) 1 MG tablet; Take 1 tablet (1 mg total) by mouth daily with breakfast.  Essential hypertension Slightly elevated today Improved control at home -     amLODipine (NORVASC) 10 MG tablet; Take 1 tablet (10 mg total) by mouth daily.  Neuropathy (HCC) stable Cont gabapentin Discussed trial capsacin cream for break through symptoms Hydrocodone prn Cont current dose, 1025m30 tabs given x 3 Rx Helps with control of neuropathy so she can sleep. Not able to sleep without it Continue for now -     HYDROcodone-acetaminophen (NORCO) 10-325 MG tablet; Take 1 tablet by mouth at bedtime as needed.  Pain medication agreement signed  Gastroesophageal reflux disease without esophagitis Stable,  cont current dose -     omeprazole (PRILOSEC) 40 MG capsule; Take 1 capsule (40 mg total) by mouth daily.  Hypothyroidism, unspecified type Recent thyroid surgery, recheck tsh -     TSH  Follow up plan: Return in about 3 months (around 09/19/2016). Assunta Found, MD [place]

## 2016-06-20 LAB — CMP14+EGFR
ALBUMIN: 3.5 g/dL — AB (ref 3.6–4.8)
ALT: 18 IU/L (ref 0–32)
AST: 22 IU/L (ref 0–40)
Albumin/Globulin Ratio: 1.1 — ABNORMAL LOW (ref 1.2–2.2)
Alkaline Phosphatase: 127 IU/L — ABNORMAL HIGH (ref 39–117)
BILIRUBIN TOTAL: 0.3 mg/dL (ref 0.0–1.2)
BUN / CREAT RATIO: 16 (ref 12–28)
BUN: 33 mg/dL — AB (ref 8–27)
CALCIUM: 9.4 mg/dL (ref 8.7–10.3)
CHLORIDE: 100 mmol/L (ref 96–106)
CO2: 26 mmol/L (ref 18–29)
Creatinine, Ser: 2.04 mg/dL — ABNORMAL HIGH (ref 0.57–1.00)
GFR, EST AFRICAN AMERICAN: 29 mL/min/{1.73_m2} — AB (ref >59)
GFR, EST NON AFRICAN AMERICAN: 25 mL/min/{1.73_m2} — AB (ref >59)
Globulin, Total: 3.2 g/dL (ref 1.5–4.5)
Glucose: 95 mg/dL (ref 65–99)
POTASSIUM: 4.9 mmol/L (ref 3.5–5.2)
Sodium: 144 mmol/L (ref 134–144)
TOTAL PROTEIN: 6.7 g/dL (ref 6.0–8.5)

## 2016-06-20 LAB — TSH: TSH: 3.8 u[IU]/mL (ref 0.450–4.500)

## 2016-07-10 ENCOUNTER — Ambulatory Visit: Payer: Medicare Other | Admitting: Internal Medicine

## 2016-07-10 ENCOUNTER — Telehealth: Payer: Self-pay | Admitting: Internal Medicine

## 2016-07-10 NOTE — Telephone Encounter (Signed)
Patient no showed today's appt. Please advise on how to follow up. °A. No follow up necessary. °B. Follow up urgent. Contact patient immediately. °C. Follow up necessary. Contact patient and schedule visit in ___ days. °D. Follow up advised. Contact patient and schedule visit in ____weeks. ° °

## 2016-07-10 NOTE — Telephone Encounter (Signed)
3mo

## 2016-07-24 ENCOUNTER — Other Ambulatory Visit: Payer: Self-pay | Admitting: Pediatrics

## 2016-07-31 NOTE — Telephone Encounter (Signed)
Patient has moved to [place], will be seeing a doc there.

## 2016-10-02 NOTE — Anesthesia Postprocedure Evaluation (Signed)
Anesthesia Post Note  Patient: [name]  Procedure(s) Performed: Procedure(s) (LRB): RIGHT THYROID LOBECTOMY (Right)     Anesthesia Post Evaluation  Last Vitals:  Vitals:   05/30/16 0549 05/30/16 0923  BP: (!) 140/54 (!) 129/57  Pulse: 60 63  Resp: 15 16  Temp: 36.8 C 36.9 C    Last Pain:  Vitals:   05/30/16 1100  TempSrc:   PainSc: 3                  Lynda Rainwater

## 2016-10-02 NOTE — Addendum Note (Signed)
Addendum  created 10/02/16 1631 by Lynda Rainwater, MD   Sign clinical note

## 2017-07-20 ENCOUNTER — Encounter: Payer: Self-pay | Admitting: Internal Medicine

## 2020-02-18 ENCOUNTER — Encounter (HOSPITAL_COMMUNITY): Payer: Self-pay

## 2020-02-18 ENCOUNTER — Emergency Department (HOSPITAL_COMMUNITY)
Admission: EM | Admit: 2020-02-18 | Discharge: 2020-02-18 | Disposition: A | Discharge status: Home / Self Care | Payer: Medicare Other | Attending: Emergency Medicine | Admitting: Emergency Medicine

## 2020-02-18 ENCOUNTER — Emergency Department (HOSPITAL_COMMUNITY): Payer: Medicare Other

## 2020-02-18 ENCOUNTER — Other Ambulatory Visit: Payer: Self-pay

## 2020-02-18 DIAGNOSIS — Z7984 Long term (current) use of oral hypoglycemic drugs: Secondary | ICD-10-CM | POA: Insufficient documentation

## 2020-02-18 DIAGNOSIS — E1140 Type 2 diabetes mellitus with diabetic neuropathy, unspecified: Secondary | ICD-10-CM | POA: Diagnosis not present

## 2020-02-18 DIAGNOSIS — E039 Hypothyroidism, unspecified: Secondary | ICD-10-CM | POA: Insufficient documentation

## 2020-02-18 DIAGNOSIS — I129 Hypertensive chronic kidney disease with stage 1 through stage 4 chronic kidney disease, or unspecified chronic kidney disease: Secondary | ICD-10-CM | POA: Insufficient documentation

## 2020-02-18 DIAGNOSIS — N184 Chronic kidney disease, stage 4 (severe): Secondary | ICD-10-CM | POA: Diagnosis not present

## 2020-02-18 DIAGNOSIS — Z85828 Personal history of other malignant neoplasm of skin: Secondary | ICD-10-CM | POA: Diagnosis not present

## 2020-02-18 DIAGNOSIS — Y9301 Activity, walking, marching and hiking: Secondary | ICD-10-CM | POA: Diagnosis not present

## 2020-02-18 DIAGNOSIS — S300XXA Contusion of lower back and pelvis, initial encounter: Secondary | ICD-10-CM | POA: Diagnosis not present

## 2020-02-18 DIAGNOSIS — Z96641 Presence of right artificial hip joint: Secondary | ICD-10-CM | POA: Diagnosis not present

## 2020-02-18 DIAGNOSIS — W182XXA Fall in (into) shower or empty bathtub, initial encounter: Secondary | ICD-10-CM | POA: Diagnosis not present

## 2020-02-18 DIAGNOSIS — Z79899 Other long term (current) drug therapy: Secondary | ICD-10-CM | POA: Diagnosis not present

## 2020-02-18 DIAGNOSIS — S3992XA Unspecified injury of lower back, initial encounter: Secondary | ICD-10-CM | POA: Diagnosis present

## 2020-02-18 DIAGNOSIS — Y92002 Bathroom of unspecified non-institutional (private) residence single-family (private) house as the place of occurrence of the external cause: Secondary | ICD-10-CM | POA: Insufficient documentation

## 2020-02-18 DIAGNOSIS — Z91040 Latex allergy status: Secondary | ICD-10-CM | POA: Insufficient documentation

## 2020-02-18 DIAGNOSIS — J449 Chronic obstructive pulmonary disease, unspecified: Secondary | ICD-10-CM | POA: Insufficient documentation

## 2020-02-18 LAB — BASIC METABOLIC PANEL
Anion gap: 9 (ref 5–15)
BUN: 27 mg/dL — ABNORMAL HIGH (ref 8–23)
CO2: 24 mmol/L (ref 22–32)
Calcium: 8.9 mg/dL (ref 8.9–10.3)
Chloride: 105 mmol/L (ref 98–111)
Creatinine, Ser: 1.99 mg/dL — ABNORMAL HIGH (ref 0.44–1.00)
GFR, Estimated: 27 mL/min — ABNORMAL LOW (ref >60)
Glucose, Bld: 192 mg/dL — ABNORMAL HIGH (ref 70–99)
Potassium: 3.8 mmol/L (ref 3.5–5.1)
Sodium: 138 mmol/L (ref 135–145)

## 2020-02-18 LAB — CBC
HCT: 34.6 % — ABNORMAL LOW (ref 36.0–46.0)
Hemoglobin: 10.7 g/dL — ABNORMAL LOW (ref 12.0–15.0)
MCH: 29.3 pg (ref 26.0–34.0)
MCHC: 30.9 g/dL (ref 30.0–36.0)
MCV: 94.8 fL (ref 80.0–100.0)
Platelets: 245 10*3/uL (ref 150–400)
RBC: 3.65 MIL/uL — ABNORMAL LOW (ref 3.87–5.11)
RDW: 12.7 % (ref 11.5–15.5)
WBC: 7.6 10*3/uL (ref 4.0–10.5)
nRBC: 0 % (ref 0.0–0.2)

## 2020-02-18 MED ORDER — NAPROXEN 250 MG PO TABS
500.0000 mg | ORAL_TABLET | Freq: Once | ORAL | Status: AC
  Administered 2020-02-18: 500 mg via ORAL
  Filled 2020-02-18: qty 2

## 2020-02-18 MED ORDER — NAPROXEN 500 MG PO TABS: 500.0000 mg | ORAL_TABLET | Freq: Two times a day (BID) | ORAL | 0 refills | Status: AC

## 2020-02-18 NOTE — ED Notes (Signed)
Call from daughter in [place] [phone]  She request that Dr call her because her mother was w her last week and felt badly all week   She believes she needs a CXR and that her weakness is why she fell today   Dr Jerilynn Mages informed

## 2020-02-18 NOTE — ED Triage Notes (Signed)
Pt reports that she was walking out of bathroom and pulled door. Fell back her buttocks hit the side of the tub and she fell backwards. Pt reports lower back pain and buttocks pain

## 2020-02-18 NOTE — ED Provider Notes (Signed)
[facility] EMERGENCY DEPARTMENT Provider Note   CSN: [id number] Arrival date & time: 02/18/20  1651     History Chief Complaint  Patient presents with  . Fall    [name] is a 69 y.o. female.  HPI   This patient is a 69 year old female, presents to the hospital today after having a fall.  She is visiting family from [place], she lives with her daughter in [place].  The patient has a spinal stimulator implanted due to back pain in the past.  She states that she was trying to walk out of the bathroom, the bathroom door swings into the bathroom and states that when she opened the door it caused her to fall onto the edge of the bathtub striking her buttock, sacrum and right buttock.  She has some difficulty walking afterwards though she could.  Denies numbness or weakness.  This occurred just prior to arrival, symptoms are persistent and worse with range of motion of the right leg.  There is been no bleeding.   Past Medical History:  Diagnosis Date  . Arthritis   . Cancer ([place])    skin cancers froze arms and neck  . Chronic kidney disease, stage III (moderate) (HCC)    Stage 4  . COPD (chronic obstructive pulmonary disease) (HCC)    asthma  . Depression   . Dysrhythmia    skips a beat sometimes  . Essential hypertension   . GERD (gastroesophageal reflux disease)   . Gout   . H/O hiatal hernia   . History of cellulitis   . History of skin cancer   . Hyperlipidemia   . Hypothyroidism   . Low back pain   . Neuromuscular disorder (HCC)    neuropathy  . Obese   . Pneumonia    hx of  . RLS (restless legs syndrome)    Sleep study in 2010: No apnea; moderate periodic movements  . Type 2 diabetes mellitus (HCC)    type 2  . Upper GI bleed   . Vitiligo     Patient Active Problem List   Diagnosis Date Noted  . 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 Thyroid nodule 04/11/2016  . Pain medication agreement signed 03/21/2016  . Neuropathy  03/21/2016  . Depression 03/06/2016  . Acute bronchitis 07/01/2014  . Non-ST elevation MI (NSTEMI) ([place]) 07/01/2014  . Mucopurulent chronic bronchitis ([place]) 03/30/2014  . Hypokalemia 03/30/2014  . Dyslipidemia associated with type 2 diabetes mellitus ([place]) 12/01/2013  . Hypoglycemia 10/26/2013  . Hyperglycemia 10/22/2013  . Sensation of cold in finger-Left 09/12/2013  . Pain of left hand 09/12/2013  . Numbness of left hand 07/29/2013  . Cramping of hands 07/29/2013  . 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 End stage renal disease ([place]) 12/13/2012  . Encounter for routine gynecological examination 09/30/2012  . A-V fistula ([place]) 09/14/2012  . Heme positive stool 08/18/2012  . Psoriasis 07/26/2012  . Pain of left hip joint 07/26/2012  . DM type 2, uncontrolled, with neuropathy ([place]) 04/29/2012  . Chest pain   . RLS (restless legs syndrome)   . Chronic kidney disease, stage IV (severe) ([place])   . Gout 11/22/2009  . Anemia, normocytic normochromic 07/24/2009  . Hypothyroidism 01/22/2009  . Hyperlipidemia 01/22/2009  . Obesity 01/22/2009  . Hypertension 01/22/2009  . Gastroesophageal reflux disease 01/22/2009    Past Surgical History:  Procedure Laterality Date  . ABDOMINAL HYSTERECTOMY    .  APPENDECTOMY    . AV FISTULA PLACEMENT Left 07/16/2012   Procedure: ARTERIOVENOUS (AV) FISTULA CREATION;  Surgeon: Mal Misty, MD;  Location: [place];  Service: Vascular;  Laterality: Left;  . AV FISTULA PLACEMENT Left 07/29/2012   Procedure: ARTERIOVENOUS (AV) FISTULA CREATION;  Surgeon: Mal Misty, MD;  Location: [facility];  Service: Vascular;  Laterality: Left;  Left Brachial-Cephalic Arteriovenous Fistula Creation  . BACK SURGERY     may 2017  . [place], 2011   Right in 2011; previously bilateral  . COLONOSCOPY  02/2002, 07/2009   few diverticula, otherwise negative  . COLONOSCOPY WITH ESOPHAGOGASTRODUODENOSCOPY (EGD)  N/A 08/27/2012   RMR: Normal esophagus s/p 54 F dilator. Small hiatal hernia. Gastric polyp removed, oozing (hyperplastic), which could explain heme positive stool:Colonic diverticulosis  . ESOPHAGOGASTRODUODENOSCOPY N/A 10/27/2013   Dr.Rourk- esophageal mucosa appeared normal. tubular esophagus widely patent throughout its course. stomach empty. patient had longitudinal proximal gastric ersions overlying multiple proximal folds. nodular mucosa with overlying antral erosions as well. no ulcer or infiltrating process seen patent pylorus, normal first and second portion of the duodenum. bx= reactive gastropathy  . FOOT SURGERY  1984   Bilateral heel spurs  . INCISE AND DRAIN ABCESS  2010   Neck-MRSA  . MALONEY DILATION N/A 10/27/2013   Procedure: Venia Minks DILATION;  Surgeon: Daneil Dolin, MD;  [facility];  Service: Endoscopy;  Laterality: N/A;  . PARTIAL HYSTERECTOMY  1985   Vaginal  . REVISON OF ARTERIOVENOUS FISTULA Left 08/03/2013   Procedure: BANDING OF BRACHIOCEPHALIC ARTERIOVENOUS FISTULA;  Surgeon: Conrad [place], MD;  Location: [facility];  Service: Vascular;  Laterality: Left;  . SAVORY DILATION N/A 10/27/2013   Procedure: SAVORY DILATION;  Surgeon: Daneil Dolin, MD;  [facility];  Service: Endoscopy;  Laterality: N/A;  . SHOULDER SURGERY  1997   Left  . SHOULDER SURGERY     RIGHT  ADJUSTED   . SHUNTOGRAM Left 08/01/2013   Procedure: FISTULOGRAM;  Surgeon: Conrad Sherwood, MD;  Location: [facility];  Service: Cardiovascular;  Laterality: Left;  . THROMBECTOMY W/ EMBOLECTOMY Left 08/03/2013   Procedure: THROMBECTOMY OF ARTERIOVENOUS FISTULA  AND REVISION OF  ARTERIOVENOUS FISTULA BANDING.;  Surgeon: Conrad Watson, MD;  Location: [facility];  Service: Vascular;  Laterality: Left;  . THYROID LOBECTOMY Right 05/29/2016   Procedure: RIGHT THYROID LOBECTOMY;  Surgeon: Armandina Gemma, MD;  Location: [facility];  Service: General;  Laterality: Right;  . TOTAL HIP ARTHROPLASTY Right   . TUBAL  LIGATION  1976     OB History    Gravida      Para      Term      Preterm      AB      Living  3     SAB      TAB      Ectopic      Multiple      Live Births              Family History  Problem Relation Age of Onset  . Emphysema Mother   . COPD Mother   . Colon cancer Father   . Diabetes Father   . Hypertension Father   . Rheum arthritis Sister   . Colon cancer Brother 69  . Congestive Heart Failure Maternal Grandmother   . Cancer Paternal Grandmother     Social History   Tobacco Use  . Smoking  status: Never Smoker  . Smokeless tobacco: Never Used  Substance Use Topics  . Alcohol use: No  . Drug use: No    Home Medications Prior to Admission medications   Medication Sig Start Date End Date Taking? Authorizing Provider  allopurinol (ZYLOPRIM) 100 MG tablet Take 1 tablet (100 mg total) by mouth every morning. 03/25/16   Eustaquio Maize, MD  amLODipine (NORVASC) 10 MG tablet Take 1 tablet (10 mg total) by mouth daily. 06/19/16   Eustaquio Maize, MD  atorvastatin (LIPITOR) 80 MG tablet Take 1 tablet (80 mg total) by mouth daily. 06/19/16   Eustaquio Maize, MD  citalopram (CELEXA) 40 MG tablet Take 1 tablet (40 mg total) by mouth daily. 03/25/16   Eustaquio Maize, MD  gabapentin (NEURONTIN) 800 MG tablet Take 1 tablet (800 mg total) by mouth at bedtime. 06/19/16   Eustaquio Maize, MD  glimepiride (AMARYL) 1 MG tablet Take 1 tablet (1 mg total) by mouth daily with breakfast. 06/19/16   Eustaquio Maize, MD  HYDROcodone-acetaminophen [facility]) 10-325 MG tablet Take 1 tablet by mouth at bedtime as needed. 06/19/16   Eustaquio Maize, MD  HYDROcodone-acetaminophen (NORCO) 10-325 MG tablet Take 1 tablet by mouth at bedtime as needed. 06/19/16   Eustaquio Maize, MD  HYDROcodone-acetaminophen (NORCO) 10-325 MG tablet Take 1 tablet by mouth at bedtime as needed. 06/19/16   Eustaquio Maize, MD  levothyroxine (SYNTHROID, LEVOTHROID) 125 MCG tablet Take 0.5 tablets (62.5  mcg total) by mouth daily before breakfast. 03/25/16   Eustaquio Maize, MD  metFORMIN (GLUCOPHAGE) 500 MG tablet Take 1 tablet (500 mg total) by mouth 2 (two) times daily with a meal. 03/25/16   Eustaquio Maize, MD  metoprolol succinate (TOPROL-XL) 50 MG 24 hr tablet Take 2 tablets (100 mg total) by mouth daily. Take with or immediately following a meal. 04/21/16   Eustaquio Maize, MD  naproxen (NAPROSYN) 500 MG tablet Take 1 tablet (500 mg total) by mouth 2 (two) times daily. 02/18/20   Noemi Chapel, MD  nitroGLYCERIN (NITROSTAT) 0.4 MG SL tablet Place 1 tablet (0.4 mg total) under the tongue every 5 (five) minutes as needed for chest pain. 09/30/13   Wardell Honour, MD  omeprazole (PRILOSEC) 40 MG capsule Take 1 capsule (40 mg total) by mouth daily. 06/19/16   Eustaquio Maize, MD  rOPINIRole (REQUIP) 1 MG tablet Take 1 tablet (1 mg total) by mouth at bedtime. 03/25/16   Eustaquio Maize, MD  torsemide (DEMADEX) 20 MG tablet TAKE 2 TABLETS (40 MG TOTAL) BY MOUTH DAILY 07/25/16   Eustaquio Maize, MD  traZODone (DESYREL) 50 MG tablet Take 50 mg by mouth at bedtime.    [provider]    Allergies    Latex, Vancomycin, Vancomycin hcl, Adhesive [tape], and Quinapril hcl  Review of Systems   Review of Systems  Gastrointestinal: Negative for nausea and vomiting.  Musculoskeletal: Positive for gait problem and myalgias. Negative for back pain and neck pain.  Skin: Positive for wound.  Neurological: Negative for headaches.    Physical Exam Updated Vital Signs BP (!) 165/59 (BP Location: Right Arm)   Pulse 76   Temp 98.1 F (36.7 C) (Oral)   Resp 18   Ht 1.676 m (5\' 6" )   Wt 77.1 kg   SpO2 93%   BMI 27.44 kg/m   Physical Exam Vitals and nursing note reviewed.  Constitutional:      Appearance: She  is well-developed. She is not diaphoretic.  HENT:     Head: Normocephalic and atraumatic.  Eyes:     General:        Right eye: No discharge.        Left eye: No discharge.      Conjunctiva/sclera: Conjunctivae normal.  Pulmonary:     Effort: Pulmonary effort is normal. No respiratory distress.  Abdominal:     General: Abdomen is flat.     Palpations: Abdomen is soft.     Tenderness: There is no abdominal tenderness.  Musculoskeletal:     Comments: The patient is hesitant to move her right leg secondary to pain in the right buttock.  I am able to lift her leg off the bed and she is able to hold it in the air.  Full range of motion of the hip knee and ankle to passive range of motion without tenderness.  She has tenderness to palpation over the buttock, mild tenderness over the sacrum and coccyx.  There is no signs of hematomas abrasions or contusions, there does appear to be a very superficial sacral decubitus, stage I, the patient is aware of this and states it has been there for couple years.  Skin:    General: Skin is warm and dry.     Findings: No erythema or rash.  Neurological:     Mental Status: She is alert.     Coordination: Coordination normal.     Comments: The patient is able to follow commands without difficulty except for lifting the right leg secondary to pain in the right buttock.  She is awake and alert     ED Results / Procedures / Treatments   Labs (all labs ordered are listed, but only abnormal results are displayed) Labs Reviewed  BASIC METABOLIC PANEL - Abnormal; Notable for the following components:      Result Value   Glucose, Bld 192 (*)    BUN 27 (*)    Creatinine, Ser 1.99 (*)    GFR, Estimated 27 (*)    All other components within normal limits  CBC - Abnormal; Notable for the following components:   RBC 3.65 (*)    Hemoglobin 10.7 (*)    HCT 34.6 (*)    All other components within normal limits    EKG None  Radiology DG Chest 2 View  Result Date: 02/18/2020 CLINICAL DATA:  Status post fall. EXAM: CHEST - 2 VIEW COMPARISON:  May 27, 2016 FINDINGS: Mild linear atelectasis is seen within the left lung base. There is no  evidence of acute infiltrate, pleural effusion or pneumothorax. The heart size and mediastinal contours are within normal limits. There is mild calcification of the aortic arch. A spinal stimulator wire is seen with its distal tip at the approximate level of T10-T11. A chronic deformity is seen involving the distal aspect of the right clavicle. IMPRESSION: Mild left basilar linear atelectasis. Electronically Signed   By: Virgina Norfolk M.D.   On: 02/18/2020 18:46   DG Lumbar Spine Complete  Result Date: 02/18/2020 CLINICAL DATA:  Status post fall. EXAM: LUMBAR SPINE - COMPLETE 4+ VIEW COMPARISON:  None. FINDINGS: There is no evidence of lumbar spine fracture. Alignment is normal. Mild multilevel endplate sclerosis is seen. Moderate severity intervertebral disc space narrowing is noted at the level of L5-S1. A radiopaque spinal stimulator and associated stimulator wire are noted. IMPRESSION: 1. Multilevel degenerative disc disease, most prominent at L5-S1. Electronically Signed   By: Hoover Browns  [place] M.D.   On: 02/18/2020 18:10   DG Pelvis 1-2 Views  Result Date: 02/18/2020 CLINICAL DATA:  Status post fall. EXAM: PELVIS - 1-2 VIEW COMPARISON:  None. FINDINGS: There is no evidence of acute fracture or pelvic fracture or diastasis. A total right hip replacement is seen. There is no evidence of surrounding lucency to suggest the presence of hardware loosening or infection. No pelvic bone lesions are seen. A radiopaque spinal stimulator and associated stimulator wire is seen. IMPRESSION: 1. Right hip replacement without evidence of hardware complication or diastasis. Electronically Signed   By: Virgina Norfolk M.D.   On: 02/18/2020 18:11   DG Sacrum/Coccyx  Result Date: 02/18/2020 CLINICAL DATA:  Status post fall. EXAM: SACRUM AND COCCYX - 2+ VIEW COMPARISON:  None. FINDINGS: There is no evidence of fracture or other focal bone lesions. An intact total right hip replacement is seen. A radiopaque  spinal stimulator and associated stimulator wire are noted. IMPRESSION: 1. No acute osseous abnormality. Electronically Signed   By: Virgina Norfolk M.D.   On: 02/18/2020 18:09    Procedures Procedures (including critical care time)  Medications Ordered in ED Medications - No data to display  ED Course  I have reviewed the triage vital signs and the nursing notes.  Pertinent labs & imaging results that were available during my care of the patient were reviewed by me and considered in my medical decision making (see chart for details).  Clinical Course as of Feb 18 1927  Sat Feb 18, 2020  1918 Family has communicated that the patient was sick last week, they request a chest x-ray done.  I have done that as well as lab work and there does not appear to be any difference in the patient's history of renal insufficiency and mild renal failure as well as her history of mild anemia, labs are unchanged and the chest x-ray is unremarkable.  This patient is stable for discharge   [BM]    Clinical Course User Index [BM] Noemi Chapel, MD   MDM Rules/Calculators/A&P                          Pt has evidence of contusion R/o fracture of the sacrum, coccyx, L spine and pelvis Pt has some chrnoic pain Did not hit her head / neck and has no neuro sx. Has some chrnoic mild break down of the skin on the sacral, no open ulcers, spends lots of time sitting / laying according to daughter in law who brings her in today.  Can care for this through her PCP.    Final Clinical Impression(s) / ED Diagnoses Final diagnoses:  Contusion, buttock, initial encounter    Rx / DC Orders ED Discharge Orders         Ordered    naproxen (NAPROSYN) 500 MG tablet  2 times daily        02/18/20 1927           Noemi Chapel, MD 02/18/20 1928

## 2020-02-18 NOTE — ED Notes (Signed)
To radiology

## 2020-02-18 NOTE — Discharge Instructions (Signed)
Your testing today reveals no signs of broken bones of your pelvis hip spine.  Your chest x-ray was normal and your blood work is unchanged from prior blood work.  Please drink plenty of clear liquids, I would also like you to take an anti-inflammatory such as naproxen twice a day, you may place ice on the area that hurts intermittently and try to walk around the house to get some mild exercise.  See your doctor in 1 week for recheck

## 2020-02-18 NOTE — ED Notes (Signed)
From Rad 

## 2020-02-18 NOTE — ED Notes (Signed)
Return to rad

## 2021-02-21 DEATH — deceased
